# Patient Record
Sex: Female | Born: 1970 | Race: Black or African American | Hispanic: No | Marital: Single | Smoking: Never smoker
Health system: Southern US, Community
[De-identification: ages and names within clinical notes are randomized; demographics above are authoritative.]

## PROBLEM LIST (undated history)

## (undated) DIAGNOSIS — D649 Anemia, unspecified: Secondary | ICD-10-CM

## (undated) DIAGNOSIS — E559 Vitamin D deficiency, unspecified: Secondary | ICD-10-CM

## (undated) DIAGNOSIS — G43909 Migraine, unspecified, not intractable, without status migrainosus: Secondary | ICD-10-CM

## (undated) DIAGNOSIS — I951 Orthostatic hypotension: Secondary | ICD-10-CM

## (undated) DIAGNOSIS — R000 Tachycardia, unspecified: Secondary | ICD-10-CM

## (undated) DIAGNOSIS — F4310 Post-traumatic stress disorder, unspecified: Secondary | ICD-10-CM

## (undated) DIAGNOSIS — Z5189 Encounter for other specified aftercare: Secondary | ICD-10-CM

## (undated) DIAGNOSIS — N912 Amenorrhea, unspecified: Secondary | ICD-10-CM

## (undated) DIAGNOSIS — Z8719 Personal history of other diseases of the digestive system: Secondary | ICD-10-CM

## (undated) DIAGNOSIS — F32A Depression, unspecified: Secondary | ICD-10-CM

## (undated) DIAGNOSIS — E079 Disorder of thyroid, unspecified: Secondary | ICD-10-CM

## (undated) HISTORY — DX: Depression, unspecified: F32.A

## (undated) HISTORY — PX: TUBAL LIGATION: SHX77

## (undated) HISTORY — PX: HERNIA REPAIR: SHX51

---

## 2013-06-06 ENCOUNTER — Encounter (HOSPITAL_COMMUNITY): Payer: Self-pay | Admitting: Emergency Medicine

## 2013-06-06 ENCOUNTER — Emergency Department (HOSPITAL_COMMUNITY)
Admission: EM | Admit: 2013-06-06 | Discharge: 2013-06-06 | Disposition: A | Discharge status: Home / Self Care | Payer: Medicaid Other | Source: Home / Self Care | Attending: Family Medicine | Admitting: Family Medicine

## 2013-06-06 DIAGNOSIS — G43009 Migraine without aura, not intractable, without status migrainosus: Secondary | ICD-10-CM

## 2013-06-06 MED ORDER — DIPHENHYDRAMINE HCL 50 MG/ML IJ SOLN
INTRAMUSCULAR | Status: AC
  Filled 2013-06-06: qty 1

## 2013-06-06 MED ORDER — DIPHENHYDRAMINE HCL 50 MG/ML IJ SOLN
50.0000 mg | Freq: Once | INTRAMUSCULAR | Status: AC
  Administered 2013-06-06: 50 mg via INTRAMUSCULAR

## 2013-06-06 MED ORDER — KETOROLAC TROMETHAMINE 30 MG/ML IJ SOLN
INTRAMUSCULAR | Status: AC
  Filled 2013-06-06: qty 1

## 2013-06-06 MED ORDER — ONDANSETRON 4 MG PO TBDP
8.0000 mg | ORAL_TABLET | Freq: Once | ORAL | Status: AC
  Administered 2013-06-06: 8 mg via ORAL

## 2013-06-06 MED ORDER — KETOROLAC TROMETHAMINE 30 MG/ML IJ SOLN
30.0000 mg | Freq: Once | INTRAMUSCULAR | Status: AC
  Administered 2013-06-06: 30 mg via INTRAMUSCULAR

## 2013-06-06 MED ORDER — ONDANSETRON 4 MG PO TBDP
ORAL_TABLET | ORAL | Status: AC
  Filled 2013-06-06: qty 2
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
## 2013-06-06 NOTE — ED Notes (Signed)
Pt c/o migraine headache across forehead onset 3 days... Pain is constant and increases w/bright light and loud noise... sxs also include dizziness... Had tyle last night w/no relief... Reports tyle and ibup and Excedrin has no relief... She is alert w/no signs of acute distress

## 2013-06-28 ENCOUNTER — Emergency Department (HOSPITAL_COMMUNITY): Payer: Medicaid Other

## 2013-06-28 ENCOUNTER — Encounter (HOSPITAL_COMMUNITY): Payer: Self-pay | Admitting: Physical Medicine and Rehabilitation

## 2013-06-28 ENCOUNTER — Emergency Department (HOSPITAL_COMMUNITY)
Admission: EM | Admit: 2013-06-28 | Discharge: 2013-06-28 | Disposition: A | Discharge status: Home / Self Care | Payer: Medicaid Other | Attending: Emergency Medicine | Admitting: Emergency Medicine

## 2013-06-28 DIAGNOSIS — R42 Dizziness and giddiness: Secondary | ICD-10-CM | POA: Insufficient documentation

## 2013-06-28 DIAGNOSIS — R110 Nausea: Secondary | ICD-10-CM | POA: Insufficient documentation

## 2013-06-28 DIAGNOSIS — G43009 Migraine without aura, not intractable, without status migrainosus: Secondary | ICD-10-CM

## 2013-06-28 DIAGNOSIS — H53149 Visual discomfort, unspecified: Secondary | ICD-10-CM | POA: Insufficient documentation

## 2013-06-28 DIAGNOSIS — G43909 Migraine, unspecified, not intractable, without status migrainosus: Secondary | ICD-10-CM | POA: Insufficient documentation

## 2013-06-28 HISTORY — DX: Migraine, unspecified, not intractable, without status migrainosus: G43.909

## 2013-06-28 MED ORDER — KETOROLAC TROMETHAMINE 30 MG/ML IJ SOLN
30.0000 mg | Freq: Once | INTRAMUSCULAR | Status: AC
  Administered 2013-06-28: 30 mg via INTRAVENOUS
  Filled 2013-06-28: qty 1

## 2013-06-28 MED ORDER — DIPHENHYDRAMINE HCL 50 MG/ML IJ SOLN
50.0000 mg | Freq: Once | INTRAMUSCULAR | Status: AC
  Administered 2013-06-28: 50 mg via INTRAVENOUS
  Filled 2013-06-28: qty 1

## 2013-06-28 MED ORDER — SUMATRIPTAN SUCCINATE 50 MG PO TABS: 50.0000 mg | ORAL_TABLET | ORAL | Status: DC | PRN

## 2013-06-28 MED ORDER — MORPHINE SULFATE 2 MG/ML IJ SOLN: 1.0000 mg | Freq: Once | INTRAMUSCULAR | Status: DC

## 2013-06-28 MED ORDER — ONDANSETRON HCL 4 MG PO TABS
8.0000 mg | ORAL_TABLET | Freq: Once | ORAL | Status: AC
  Administered 2013-06-28: 8 mg via ORAL
  Filled 2013-06-28: qty 1

## 2013-06-28 NOTE — ED Notes (Signed)
Condition is chronic. Condition is made by light and "dizzy feeling" condition is made better by nothing.

## 2013-06-28 NOTE — ED Notes (Signed)
Pt presents to department for evaluation of headache. States she gets frequent migraines. Also states photosensitivity and nausea. 10/10 pain upon arrival to ED. She is alert and oriented x4. No neurological symptoms noted.

## 2013-06-28 NOTE — Progress Notes (Signed)
Met Patient at bedside,Case Management role explained.Patient reports increased migraine headache as reason for ED visit.Patient has MEDICAID[place]. Patient does not have a PCP,patient provided with a MEDICAID resource pack and education provided on medicaid resources,PCP providers . Patient verbalizes her understanding of verbal Written education today.No further Case management needs identified.

## 2013-06-28 NOTE — ED Provider Notes (Signed)
I saw and evaluated the patient, reviewed the resident's note and I agree with the findings and plan.   .Face to face Exam:  General:  Awake HEENT:  Atraumatic Resp:  Normal effort Abd:  Nondistended Neuro:No focal weakness  Nelia Shi, MD 06/28/13 1624

## 2013-06-28 NOTE — ED Notes (Signed)
IV team called at this time.  
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
## 2013-07-08 ENCOUNTER — Encounter (HOSPITAL_COMMUNITY): Payer: Self-pay | Admitting: Nurse Practitioner

## 2013-07-08 ENCOUNTER — Emergency Department (HOSPITAL_COMMUNITY)
Admission: EM | Admit: 2013-07-08 | Discharge: 2013-07-08 | Disposition: A | Discharge status: Home / Self Care | Payer: Medicaid Other | Attending: Emergency Medicine | Admitting: Emergency Medicine

## 2013-07-08 DIAGNOSIS — G43909 Migraine, unspecified, not intractable, without status migrainosus: Secondary | ICD-10-CM | POA: Insufficient documentation

## 2013-07-08 MED ORDER — KETOROLAC TROMETHAMINE 30 MG/ML IJ SOLN
30.0000 mg | Freq: Once | INTRAMUSCULAR | Status: AC
  Administered 2013-07-08: 30 mg via INTRAVENOUS
  Filled 2013-07-08: qty 1

## 2013-07-08 MED ORDER — METOCLOPRAMIDE HCL 5 MG/ML IJ SOLN
10.0000 mg | Freq: Once | INTRAMUSCULAR | Status: AC
  Administered 2013-07-08: 10 mg via INTRAVENOUS
  Filled 2013-07-08: qty 2

## 2013-07-08 MED ORDER — DIPHENHYDRAMINE HCL 50 MG/ML IJ SOLN
25.0000 mg | Freq: Once | INTRAMUSCULAR | Status: AC
  Administered 2013-07-08: 25 mg via INTRAVENOUS
  Filled 2013-07-08: qty 1

## 2013-07-08 MED ORDER — SODIUM CHLORIDE 0.9 % IV BOLUS (SEPSIS)
1000.0000 mL | Freq: Once | INTRAVENOUS | Status: AC
  Administered 2013-07-08: 1000 mL via INTRAVENOUS

## 2013-07-08 NOTE — ED Notes (Signed)
Pt walked to the bathroom with no difficulty

## 2013-07-08 NOTE — ED Notes (Addendum)
Pt called ems for migraine x 4 days, reports history of migraines, [place] and is trying to get a PCP but has been unable to obtain appt yet. Took imitrex with no relief

## 2013-07-08 NOTE — ED Notes (Signed)
Patient is alert and orientedx4.  Patient was explained discharge instructions and they understood them with no questions.  The patient is going to take a cab home. 

## 2013-07-08 NOTE — ED Provider Notes (Signed)
CSN: [id number]     Arrival date & time 07/08/13  1221 History     First MD Initiated Contact with Patient 07/08/13 1502     Chief Complaint  Patient presents with  . Headache   (Consider location/radiation/quality/duration/timing/severity/associated sxs/prior Treatment) HPI Christina Mcbride is a 42 y.o. female who presents to ED with complaint of a headache for 3 days. States hx of the same. Associated symptoms include photophobia and dizziness. Tried imitrex with no relief. Pain in the right side that shoots into forehead and back of the head. 9/10. Denies fever, chills, neck stiffness, rash, recent travel.   Past Medical History  Diagnosis Date  . Migraine    History reviewed. No pertinent past surgical history. History reviewed. No pertinent family history. History  Substance Use Topics  . Smoking status: Never Smoker   . Smokeless tobacco: Not on file  . Alcohol Use: No   OB History   Grav Para Term Preterm Abortions TAB SAB Ect Mult Living                 Review of Systems  Constitutional: Negative for fever and chills.  HENT: Negative for neck pain and neck stiffness.   Eyes: Positive for photophobia. Negative for pain and visual disturbance.  Respiratory: Negative.   Cardiovascular: Negative.   Gastrointestinal: Negative.   Genitourinary: Negative for dysuria.  Skin: Negative for rash.  Neurological: Positive for dizziness and headaches. Negative for syncope, speech difficulty, weakness and numbness.  All other systems reviewed and are negative.    Allergies  Bee venom  Home Medications   Current Outpatient Rx  Name  Route  Sig  Dispense  Refill  . SUMAtriptan (IMITREX) 50 MG tablet   Oral   Take 50 mg by mouth every 2 (two) hours as needed for migraine. Do not exceed 200mg  in 24 hours (4 pills)          BP 137/79  Pulse 77  Temp(Src) 98.3 F (36.8 C) (Oral)  Resp 16  SpO2 100% Physical Exam  Nursing note and vitals reviewed. Constitutional: She  is oriented to person, place, and time. She appears well-developed and well-nourished. No distress.  HENT:  Head: Normocephalic and atraumatic.  Eyes: Conjunctivae and EOM are normal. Pupils are equal, round, and reactive to light.  Neck: Normal range of motion. Neck supple.  No meningismus  Cardiovascular: Normal rate, regular rhythm and normal heart sounds.   Pulmonary/Chest: Effort normal and breath sounds normal. No respiratory distress. She has no wheezes. She has no rales.  Musculoskeletal: Normal range of motion. She exhibits no edema.  Neurological: She is alert and oriented to person, place, and time. No cranial nerve deficit. Coordination normal.  5/5 and equal upper and lower extremity strength bilaterally. Equal grip strength bilaterally. Normal finger to nose and heel to shin. No pronator drift.  Skin: Skin is warm and dry.  Psychiatric: She has a normal mood and affect. Her behavior is normal.    ED Course   Procedures (including critical care time)  3:40 PM Pt with typical for her migraine. No relief at home with imitrex. Will try toradol, reglan, benadryl cocktail.     1. Migraine headache     MDM  Pt withtypical for her migraine. Exam normal. Neurovascularly intact. VS normal. Pt feels better with toradol, reglan, benadryl. She will be dc home with outpatient follow up.  Filed Vitals:   07/08/13 1530 07/08/13 1550 07/08/13 1730 07/08/13 1735  BP: 122/63 122/63  132/83 132/83  Pulse: 69 74 74 67  Temp:      TempSrc:      Resp:  20  18  SpO2: 100% 100% 99% 100%     Laylana Gerwig A Kile Kabler, PA-C 07/08/13 2310

## 2013-07-09 NOTE — ED Provider Notes (Signed)
Medical screening examination/treatment/procedure(s) were performed by non-physician practitioner and as supervising physician I was immediately available for consultation/collaboration.   Dagmar Hait, MD 07/09/13 [phone]

## 2013-09-03 ENCOUNTER — Emergency Department (HOSPITAL_COMMUNITY)
Admission: EM | Admit: 2013-09-03 | Discharge: 2013-09-03 | Disposition: A | Discharge status: Home / Self Care | Payer: Medicaid Other | Source: Home / Self Care

## 2013-09-03 ENCOUNTER — Encounter (HOSPITAL_COMMUNITY): Payer: Self-pay | Admitting: Emergency Medicine

## 2013-09-03 DIAGNOSIS — Z8669 Personal history of other diseases of the nervous system and sense organs: Secondary | ICD-10-CM

## 2013-09-03 DIAGNOSIS — G43009 Migraine without aura, not intractable, without status migrainosus: Secondary | ICD-10-CM

## 2013-09-03 MED ORDER — DIPHENHYDRAMINE HCL 50 MG/ML IJ SOLN
50.0000 mg | Freq: Once | INTRAMUSCULAR | Status: AC
  Administered 2013-09-03: 50 mg via INTRAMUSCULAR

## 2013-09-03 MED ORDER — KETOROLAC TROMETHAMINE 60 MG/2ML IM SOLN
60.0000 mg | Freq: Once | INTRAMUSCULAR | Status: AC
  Administered 2013-09-03: 60 mg via INTRAMUSCULAR

## 2013-09-03 MED ORDER — DIPHENHYDRAMINE HCL 50 MG/ML IJ SOLN
INTRAMUSCULAR | Status: AC
  Filled 2013-09-03: qty 1

## 2013-09-03 MED ORDER — KETOROLAC TROMETHAMINE 60 MG/2ML IM SOLN
INTRAMUSCULAR | Status: AC
  Filled 2013-09-03: qty 2

## 2013-09-03 MED ORDER — ONDANSETRON 4 MG PO TBDP
ORAL_TABLET | ORAL | Status: AC
  Filled 2013-09-03: qty 1

## 2013-09-03 MED ORDER — ONDANSETRON 4 MG PO TBDP
4.0000 mg | ORAL_TABLET | Freq: Once | ORAL | Status: AC
  Administered 2013-09-03: 4 mg via ORAL

## 2013-09-03 MED ORDER — DEXAMETHASONE SODIUM PHOSPHATE 10 MG/ML IJ SOLN
INTRAMUSCULAR | Status: AC
  Filled 2013-09-03: qty 1

## 2013-09-03 MED ORDER — DEXAMETHASONE SODIUM PHOSPHATE 10 MG/ML IJ SOLN
10.0000 mg | Freq: Once | INTRAMUSCULAR | Status: AC
  Administered 2013-09-03: 10 mg via INTRAMUSCULAR

## 2013-09-03 NOTE — ED Provider Notes (Signed)
Medical screening examination/treatment/procedure(s) were performed by non-physician practitioner and as supervising physician I was immediately available for consultation/collaboration.  Nekisha Mcdiarmid, M.D.  Oryn Casanova C Jelicia Nantz, MD 09/03/13 1152 

## 2013-09-03 NOTE — ED Notes (Signed)
C/o migraines gradually getting worse. Sensitivity to light. States has periods when vision goes dark. Nausea, constant pounding. Under a lot of stress lately.  Pt has tried cutting out caffeine with no relief. Also takes Excedrin migraine and sumatriptan with no relief.

## 2013-09-03 NOTE — ED Provider Notes (Signed)
CSN: [id number]     Arrival date & time 09/03/13  4098 History   First MD Initiated Contact with Patient 09/03/13 1017     Chief Complaint  Patient presents with  . Migraine    x 2 wks.    (Consider location/radiation/quality/duration/timing/severity/associated sxs/prior Treatment) HPI Comments: 42 year old female with a history of migraine headaches presents to the urgent care with a migraine fairly typical of her previous ones. She has been seen in emergency department twice in the urgent care once in the past 2 months. On August 2 when being seen in emergency department a head CT was performed. The interpretation was negative for pathology. She  stated previously that she did not have a physician although she does have Medicaid. The caseworker in emergency department gave her resources for PCP access with Medicaid. When I first entered the exam room she was on the telephone so I walked out and gave her a chance to finish her call. 1030: Patient is complaining of a typical migraine for her. The difference is that she is now having sudden visual changes for just a few seconds periodically. Pain is located across the forehead. She describes it as a severe dull ache and occasionally pounds. His been getting worse over the past 2 weeks. It is associated with dizziness, nausea and photophobia. She has taken OTC Excedrin Migraine and 2 days ago symmetric and which did not help.   Past Medical History  Diagnosis Date  . Migraine    History reviewed. No pertinent past surgical history. History reviewed. No pertinent family history. History  Substance Use Topics  . Smoking status: Never Smoker   . Smokeless tobacco: Not on file  . Alcohol Use: No   OB History   Grav Para Term Preterm Abortions TAB SAB Ect Mult Living                 Review of Systems  Constitutional: Positive for activity change and appetite change. Negative for fever.  HENT: Negative for ear discharge, ear pain, postnasal  drip, rhinorrhea and sinus pressure.   Respiratory: Negative.   Cardiovascular: Negative.   Gastrointestinal: Positive for nausea.  Genitourinary: Negative.   Neurological: Positive for dizziness, weakness and headaches. Negative for tremors, seizures, syncope, facial asymmetry, speech difficulty and numbness.  Psychiatric/Behavioral: Positive for sleep disturbance. The patient is nervous/anxious.     Allergies  Bee venom  Home Medications   Current Outpatient Rx  Name  Route  Sig  Dispense  Refill  . SUMAtriptan (IMITREX) 50 MG tablet   Oral   Take 50 mg by mouth every 2 (two) hours as needed for migraine. Do not exceed 200mg  in 24 hours (4 pills)          BP 149/90  Pulse 93  Temp(Src) 98.6 F (37 C) (Oral)  Resp 20  SpO2 97%  LMP 08/23/2013 Physical Exam  Nursing note and vitals reviewed. Constitutional: She is oriented to person, place, and time. She appears well-developed and well-nourished. She appears distressed.  HENT:  Mouth/Throat: Oropharynx is clear and moist. No oropharyngeal exudate.  Bilateral TMs are normal Soft palate rises symmetrically Swallowing is normal  Eyes: Conjunctivae and EOM are normal. Pupils are equal, round, and reactive to light.  Neck: Normal range of motion. Neck supple.  Cardiovascular: Normal rate, regular rhythm and normal heart sounds.   Pulmonary/Chest: Effort normal and breath sounds normal.  Musculoskeletal: She exhibits no edema and no tenderness.  Lymphadenopathy:    She has  no cervical adenopathy.  Neurological: She is alert and oriented to person, place, and time. She has normal strength. No cranial nerve deficit or sensory deficit. She exhibits normal muscle tone. Coordination normal.  Skin: Skin is warm and dry. No rash noted.    ED Course  Procedures (including critical care time) Labs Review Labs Reviewed - No data to display Imaging Review No results found.  MDM   1. Migraine headache without aura   2.  History of migraine headaches    Neurologic exam is intact. No abnormal findings.  The patient was given a cocktail of IM Benadryl 50 mg, IM Toradol 60 mg, IM Decadron 10 mg. Zofran 4 mg by mouth. After a period of observation for approximately one hour she states she is feeling well but better and ready to go. She will be discharged in a stable and improved condition and will be going home by bus.  Hayden Rasmussen, NP 09/03/13 1141

## 2013-12-26 ENCOUNTER — Ambulatory Visit (HOSPITAL_BASED_OUTPATIENT_CLINIC_OR_DEPARTMENT_OTHER): Payer: Medicaid Other | Admitting: Internal Medicine

## 2013-12-26 ENCOUNTER — Encounter: Payer: Self-pay | Admitting: Internal Medicine

## 2013-12-26 VITALS — BP 149/93 | HR 88 | Temp 97.8°F | Resp 14 | Ht 66.0 in | Wt 247.4 lb

## 2013-12-26 DIAGNOSIS — R519 Headache, unspecified: Secondary | ICD-10-CM

## 2013-12-26 DIAGNOSIS — R609 Edema, unspecified: Secondary | ICD-10-CM | POA: Diagnosis present

## 2013-12-26 DIAGNOSIS — E860 Dehydration: Secondary | ICD-10-CM | POA: Diagnosis present

## 2013-12-26 DIAGNOSIS — Z23 Encounter for immunization: Secondary | ICD-10-CM

## 2013-12-26 DIAGNOSIS — G43909 Migraine, unspecified, not intractable, without status migrainosus: Secondary | ICD-10-CM | POA: Diagnosis present

## 2013-12-26 DIAGNOSIS — E876 Hypokalemia: Secondary | ICD-10-CM | POA: Diagnosis present

## 2013-12-26 DIAGNOSIS — R51 Headache: Secondary | ICD-10-CM

## 2013-12-26 DIAGNOSIS — N390 Urinary tract infection, site not specified: Secondary | ICD-10-CM | POA: Diagnosis present

## 2013-12-26 DIAGNOSIS — F4310 Post-traumatic stress disorder, unspecified: Secondary | ICD-10-CM | POA: Diagnosis present

## 2013-12-26 DIAGNOSIS — E049 Nontoxic goiter, unspecified: Secondary | ICD-10-CM | POA: Diagnosis present

## 2013-12-26 DIAGNOSIS — D509 Iron deficiency anemia, unspecified: Principal | ICD-10-CM | POA: Diagnosis present

## 2013-12-26 DIAGNOSIS — A599 Trichomoniasis, unspecified: Secondary | ICD-10-CM | POA: Diagnosis present

## 2013-12-26 DIAGNOSIS — Z79899 Other long term (current) drug therapy: Secondary | ICD-10-CM

## 2013-12-26 LAB — COMPLETE METABOLIC PANEL WITH GFR
ALK PHOS: 74 U/L (ref 39–117)
ALT: <8 U/L (ref 0–35)
AST: 8 U/L (ref 0–37)
Albumin: 3.8 g/dL (ref 3.5–5.2)
BUN: 11 mg/dL (ref 6–23)
CO2: 25 mEq/L (ref 19–32)
CREATININE: 0.71 mg/dL (ref 0.50–1.10)
Calcium: 8.5 mg/dL (ref 8.4–10.5)
Chloride: 105 mEq/L (ref 96–112)
GFR, Est African American: >89 mL/min
GLUCOSE: 68 mg/dL — AB (ref 70–99)
Potassium: 3.8 mEq/L (ref 3.5–5.3)
Sodium: 137 mEq/L (ref 135–145)
Total Bilirubin: 0.2 mg/dL (ref 0.2–1.2)
Total Protein: 7.6 g/dL (ref 6.0–8.3)

## 2013-12-26 LAB — CBC WITH DIFFERENTIAL/PLATELET
BASOS ABS: 0 10*3/uL (ref 0.0–0.1)
BASOS PCT: 0 % (ref 0–1)
EOS ABS: 0.1 10*3/uL (ref 0.0–0.7)
EOS PCT: 1 % (ref 0–5)
HEMATOCRIT: 18.6 % — AB (ref 36.0–46.0)
Hemoglobin: 5.1 g/dL — CL (ref 12.0–15.0)
Lymphocytes Relative: 25 % (ref 12–46)
Lymphs Abs: 2.3 10*3/uL (ref 0.7–4.0)
MCH: 15.7 pg — AB (ref 26.0–34.0)
MCHC: 27.4 g/dL — AB (ref 30.0–36.0)
MCV: 57.2 fL — AB (ref 78.0–100.0)
MONO ABS: 0.6 10*3/uL (ref 0.1–1.0)
MONOS PCT: 7 % (ref 3–12)
NEUTROS ABS: 6.1 10*3/uL (ref 1.7–7.7)
Neutrophils Relative %: 67 % (ref 43–77)
Platelets: 642 10*3/uL — ABNORMAL HIGH (ref 150–400)
RBC: 3.25 MIL/uL — ABNORMAL LOW (ref 3.87–5.11)
RDW: 20.6 % — AB (ref 11.5–15.5)
WBC: 9.1 10*3/uL (ref 4.0–10.5)

## 2013-12-26 LAB — LIPID PANEL
Cholesterol: 181 mg/dL (ref 0–200)
HDL: 57 mg/dL (ref >39)
LDL CALC: 104 mg/dL — AB (ref 0–99)
TRIGLYCERIDES: 102 mg/dL (ref <150)
Total CHOL/HDL Ratio: 3.2 Ratio
VLDL: 20 mg/dL (ref 0–40)

## 2013-12-26 LAB — POCT URINE PREGNANCY: PREG TEST UR: NEGATIVE

## 2013-12-26 LAB — POCT GLYCOSYLATED HEMOGLOBIN (HGB A1C): HEMOGLOBIN A1C: 4.9

## 2013-12-26 MED ORDER — TOPIRAMATE 50 MG PO TABS: 50.0000 mg | ORAL_TABLET | Freq: Two times a day (BID) | ORAL | Status: DC

## 2013-12-26 MED ORDER — SUMATRIPTAN SUCCINATE 50 MG PO TABS: 50.0000 mg | ORAL_TABLET | ORAL | Status: DC | PRN

## 2013-12-26 NOTE — Progress Notes (Signed)
Pt is here to establish care. Pt does suffer from post-traumatic stress disorder. Has a family history of hypertension, diabetes, and cancer. Complains of chronic headaches x4 years, dizziness, hard time keeping her balance, sensitivity to light and sound, slight nausea, no vomiting, blurred vision. Pt used to wear glasses. Pain scale of 7 today. Taking OTC medication, but nothing works.
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
## 2013-12-27 ENCOUNTER — Inpatient Hospital Stay (HOSPITAL_COMMUNITY)
Admission: EM | Admit: 2013-12-27 | Discharge: 2013-12-29 | DRG: 812 | Disposition: A | Discharge status: Home / Self Care | Payer: Medicaid Other | Attending: Internal Medicine | Admitting: Internal Medicine

## 2013-12-27 ENCOUNTER — Encounter (HOSPITAL_COMMUNITY): Payer: Self-pay | Admitting: Emergency Medicine

## 2013-12-27 DIAGNOSIS — G43909 Migraine, unspecified, not intractable, without status migrainosus: Secondary | ICD-10-CM | POA: Diagnosis present

## 2013-12-27 DIAGNOSIS — R42 Dizziness and giddiness: Secondary | ICD-10-CM | POA: Diagnosis present

## 2013-12-27 DIAGNOSIS — R790 Abnormal level of blood mineral: Secondary | ICD-10-CM | POA: Diagnosis present

## 2013-12-27 DIAGNOSIS — N390 Urinary tract infection, site not specified: Secondary | ICD-10-CM | POA: Diagnosis present

## 2013-12-27 DIAGNOSIS — A599 Trichomoniasis, unspecified: Secondary | ICD-10-CM | POA: Diagnosis present

## 2013-12-27 DIAGNOSIS — R531 Weakness: Secondary | ICD-10-CM | POA: Insufficient documentation

## 2013-12-27 DIAGNOSIS — D509 Iron deficiency anemia, unspecified: Secondary | ICD-10-CM | POA: Diagnosis present

## 2013-12-27 DIAGNOSIS — E049 Nontoxic goiter, unspecified: Secondary | ICD-10-CM | POA: Diagnosis present

## 2013-12-27 DIAGNOSIS — R5383 Other fatigue: Secondary | ICD-10-CM

## 2013-12-27 DIAGNOSIS — R5381 Other malaise: Secondary | ICD-10-CM

## 2013-12-27 DIAGNOSIS — R609 Edema, unspecified: Secondary | ICD-10-CM

## 2013-12-27 DIAGNOSIS — R000 Tachycardia, unspecified: Secondary | ICD-10-CM | POA: Diagnosis present

## 2013-12-27 DIAGNOSIS — D649 Anemia, unspecified: Secondary | ICD-10-CM | POA: Insufficient documentation

## 2013-12-27 DIAGNOSIS — E860 Dehydration: Secondary | ICD-10-CM

## 2013-12-27 DIAGNOSIS — F4310 Post-traumatic stress disorder, unspecified: Secondary | ICD-10-CM | POA: Diagnosis present

## 2013-12-27 DIAGNOSIS — R600 Localized edema: Secondary | ICD-10-CM

## 2013-12-27 HISTORY — DX: Post-traumatic stress disorder, unspecified: F43.10

## 2013-12-27 HISTORY — DX: Anemia, unspecified: D64.9

## 2013-12-27 LAB — CBC
HCT: 19.6 % — ABNORMAL LOW (ref 36.0–46.0)
Hemoglobin: 5.5 g/dL — CL (ref 12.0–15.0)
MCH: 17.4 pg — ABNORMAL LOW (ref 26.0–34.0)
MCHC: 28.1 g/dL — AB (ref 30.0–36.0)
MCV: 62 fL — ABNORMAL LOW (ref 78.0–100.0)
Platelets: 543 10*3/uL — ABNORMAL HIGH (ref 150–400)
RBC: 3.16 MIL/uL — ABNORMAL LOW (ref 3.87–5.11)
RDW: 22.8 % — AB (ref 11.5–15.5)
WBC: 8.4 10*3/uL (ref 4.0–10.5)

## 2013-12-27 LAB — RAPID URINE DRUG SCREEN, HOSP PERFORMED
Amphetamines: NOT DETECTED
BARBITURATES: NOT DETECTED
Benzodiazepines: NOT DETECTED
Cocaine: NOT DETECTED
Opiates: NOT DETECTED
Tetrahydrocannabinol: NOT DETECTED

## 2013-12-27 LAB — IRON AND TIBC
Iron: 13 ug/dL — ABNORMAL LOW (ref 42–135)
SATURATION RATIOS: 3 % — AB (ref 20–55)
TIBC: 407 ug/dL (ref 250–470)
UIBC: 394 ug/dL (ref 125–400)

## 2013-12-27 LAB — COMPREHENSIVE METABOLIC PANEL
ALK PHOS: 75 U/L (ref 39–117)
AST: 8 U/L (ref 0–37)
Albumin: 2.8 g/dL — ABNORMAL LOW (ref 3.5–5.2)
BUN: 11 mg/dL (ref 6–23)
CO2: 24 meq/L (ref 19–32)
Calcium: 8.2 mg/dL — ABNORMAL LOW (ref 8.4–10.5)
Chloride: 105 mEq/L (ref 96–112)
Creatinine, Ser: 0.77 mg/dL (ref 0.50–1.10)
GLUCOSE: 133 mg/dL — AB (ref 70–99)
POTASSIUM: 3.4 meq/L — AB (ref 3.7–5.3)
SODIUM: 140 meq/L (ref 137–147)
Total Protein: 7.5 g/dL (ref 6.0–8.3)

## 2013-12-27 LAB — URINALYSIS, ROUTINE W REFLEX MICROSCOPIC
Bilirubin Urine: NEGATIVE
GLUCOSE, UA: NEGATIVE mg/dL
Hgb urine dipstick: NEGATIVE
Ketones, ur: NEGATIVE mg/dL
Nitrite: POSITIVE — AB
Protein, ur: NEGATIVE mg/dL
Specific Gravity, Urine: 1.017 (ref 1.005–1.030)
UROBILINOGEN UA: 1 mg/dL (ref 0.0–1.0)
pH: 6.5 (ref 5.0–8.0)

## 2013-12-27 LAB — CREATININE, SERUM: Creatinine, Ser: 0.75 mg/dL (ref 0.50–1.10)

## 2013-12-27 LAB — CBC WITH DIFFERENTIAL/PLATELET
BASOS PCT: 0 % (ref 0–1)
Basophils Absolute: 0 10*3/uL (ref 0.0–0.1)
EOS ABS: 0.2 10*3/uL (ref 0.0–0.7)
EOS PCT: 2 % (ref 0–5)
HEMATOCRIT: 17.6 % — AB (ref 36.0–46.0)
HEMOGLOBIN: 4.7 g/dL — AB (ref 12.0–15.0)
LYMPHS PCT: 27 % (ref 12–46)
Lymphs Abs: 2.5 10*3/uL (ref 0.7–4.0)
MCH: 16 pg — AB (ref 26.0–34.0)
MCHC: 26.7 g/dL — AB (ref 30.0–36.0)
MCV: 60.1 fL — ABNORMAL LOW (ref 78.0–100.0)
Monocytes Absolute: 0.8 10*3/uL (ref 0.1–1.0)
Monocytes Relative: 8 % (ref 3–12)
NEUTROS ABS: 5.9 10*3/uL (ref 1.7–7.7)
NEUTROS PCT: 63 % (ref 43–77)
Platelets: 590 10*3/uL — ABNORMAL HIGH (ref 150–400)
RBC: 2.93 MIL/uL — AB (ref 3.87–5.11)
RDW: 20.7 % — ABNORMAL HIGH (ref 11.5–15.5)
WBC: 9.4 10*3/uL (ref 4.0–10.5)

## 2013-12-27 LAB — HEMOGLOBIN A1C
HEMOGLOBIN A1C: 5.7 % — AB (ref <5.7)
Mean Plasma Glucose: 117 mg/dL — ABNORMAL HIGH (ref <117)

## 2013-12-27 LAB — DIRECT ANTIGLOBULIN TEST (NOT AT ARMC)
DAT, IgG: NEGATIVE
DAT, complement: NEGATIVE

## 2013-12-27 LAB — URINE MICROSCOPIC-ADD ON

## 2013-12-27 LAB — SAMPLE TO BLOOD BANK

## 2013-12-27 LAB — RETICULOCYTES
RBC.: 3.06 MIL/uL — ABNORMAL LOW (ref 3.87–5.11)
RETIC CT PCT: 1.7 % (ref 0.4–3.1)
Retic Count, Absolute: 52 10*3/uL (ref 19.0–186.0)

## 2013-12-27 LAB — PROTIME-INR
INR: 1.08 (ref 0.00–1.49)
Prothrombin Time: 13.8 seconds (ref 11.6–15.2)

## 2013-12-27 LAB — OCCULT BLOOD, POC DEVICE: Fecal Occult Bld: NEGATIVE

## 2013-12-27 LAB — HIV ANTIBODY (ROUTINE TESTING W REFLEX): HIV: NONREACTIVE

## 2013-12-27 LAB — T4, FREE: FREE T4: 0.82 ng/dL (ref 0.80–1.80)

## 2013-12-27 LAB — TSH
TSH: 1.236 u[IU]/mL (ref 0.350–4.500)
TSH: 1.478 u[IU]/mL (ref 0.350–4.500)

## 2013-12-27 LAB — FOLATE: Folate: 12.8 ng/mL

## 2013-12-27 LAB — VITAMIN B12: Vitamin B-12: 691 pg/mL (ref 211–911)

## 2013-12-27 LAB — ABO/RH: ABO/RH(D): B POS

## 2013-12-27 LAB — FERRITIN: Ferritin: <1 ng/mL — ABNORMAL LOW (ref 10–291)

## 2013-12-27 LAB — PRO B NATRIURETIC PEPTIDE: Pro B Natriuretic peptide (BNP): 448.1 pg/mL — ABNORMAL HIGH (ref 0–125)

## 2013-12-27 LAB — LACTATE DEHYDROGENASE: LDH: 189 U/L (ref 94–250)

## 2013-12-27 LAB — APTT: aPTT: 32 seconds (ref 24–37)

## 2013-12-27 LAB — PREPARE RBC (CROSSMATCH)

## 2013-12-27 MED ORDER — POLYETHYLENE GLYCOL 3350 17 G PO PACK
17.0000 g | PACK | Freq: Every day | ORAL | Status: DC | PRN
  Filled 2013-12-27: qty 1

## 2013-12-27 MED ORDER — METRONIDAZOLE 500 MG PO TABS
1000.0000 mg | ORAL_TABLET | Freq: Two times a day (BID) | ORAL | Status: AC
  Administered 2013-12-27 – 2013-12-28 (×2): 1000 mg via ORAL
  Filled 2013-12-27 (×2): qty 2

## 2013-12-27 MED ORDER — POTASSIUM CHLORIDE IN NACL 20-0.9 MEQ/L-% IV SOLN
INTRAVENOUS | Status: DC
  Administered 2013-12-27 – 2013-12-28 (×2): via INTRAVENOUS
  Filled 2013-12-27 (×5): qty 1000

## 2013-12-27 MED ORDER — DIPHENHYDRAMINE HCL 25 MG PO CAPS
25.0000 mg | ORAL_CAPSULE | Freq: Once | ORAL | Status: AC
  Administered 2013-12-27: 25 mg via ORAL
  Filled 2013-12-27: qty 1

## 2013-12-27 MED ORDER — SODIUM CHLORIDE 0.9 % IJ SOLN
3.0000 mL | Freq: Two times a day (BID) | INTRAMUSCULAR | Status: DC
  Administered 2013-12-28 (×2): 3 mL via INTRAVENOUS

## 2013-12-27 MED ORDER — HEPARIN SODIUM (PORCINE) 5000 UNIT/ML IJ SOLN
5000.0000 [IU] | Freq: Three times a day (TID) | INTRAMUSCULAR | Status: DC
  Administered 2013-12-27 – 2013-12-29 (×6): 5000 [IU] via SUBCUTANEOUS
  Filled 2013-12-27 (×10): qty 1

## 2013-12-27 MED ORDER — ACETAMINOPHEN 325 MG PO TABS
650.0000 mg | ORAL_TABLET | Freq: Four times a day (QID) | ORAL | Status: DC | PRN
  Administered 2013-12-28: 650 mg via ORAL
  Filled 2013-12-27: qty 2

## 2013-12-27 MED ORDER — ACETAMINOPHEN 650 MG RE SUPP: 650.0000 mg | Freq: Four times a day (QID) | RECTAL | Status: DC | PRN

## 2013-12-27 MED ORDER — PANTOPRAZOLE SODIUM 40 MG PO TBEC
40.0000 mg | DELAYED_RELEASE_TABLET | Freq: Every day | ORAL | Status: DC
  Administered 2013-12-27 – 2013-12-28 (×2): 40 mg via ORAL
  Filled 2013-12-27 (×2): qty 1

## 2013-12-27 MED ORDER — ONDANSETRON HCL 4 MG/2ML IJ SOLN: 4.0000 mg | Freq: Four times a day (QID) | INTRAMUSCULAR | Status: DC | PRN

## 2013-12-27 MED ORDER — HYDROCODONE-ACETAMINOPHEN 5-325 MG PO TABS
1.0000 | ORAL_TABLET | ORAL | Status: DC | PRN
  Administered 2013-12-27 – 2013-12-28 (×2): 1 via ORAL
  Filled 2013-12-27 (×2): qty 1

## 2013-12-27 MED ORDER — POTASSIUM CHLORIDE CRYS ER 20 MEQ PO TBCR
40.0000 meq | EXTENDED_RELEASE_TABLET | Freq: Once | ORAL | Status: AC
  Administered 2013-12-27: 40 meq via ORAL
  Filled 2013-12-27: qty 2

## 2013-12-27 MED ORDER — ONDANSETRON HCL 4 MG PO TABS: 4.0000 mg | ORAL_TABLET | Freq: Four times a day (QID) | ORAL | Status: DC | PRN

## 2013-12-27 MED ORDER — DEXTROSE 5 % IV SOLN
1.0000 g | INTRAVENOUS | Status: DC
  Administered 2013-12-27: 1 g via INTRAVENOUS
  Filled 2013-12-27 (×2): qty 10

## 2013-12-27 MED ORDER — ACETAMINOPHEN 325 MG PO TABS
650.0000 mg | ORAL_TABLET | Freq: Once | ORAL | Status: AC
  Administered 2013-12-27: 650 mg via ORAL
  Filled 2013-12-27: qty 2

## 2013-12-27 MED ORDER — SODIUM CHLORIDE 0.9 % IV SOLN: INTRAVENOUS | Status: DC

## 2013-12-27 MED ORDER — FERROUS SULFATE 325 (65 FE) MG PO TABS
325.0000 mg | ORAL_TABLET | Freq: Every day | ORAL | Status: DC
  Administered 2013-12-28 – 2013-12-29 (×2): 325 mg via ORAL
  Filled 2013-12-27 (×3): qty 1

## 2013-12-27 NOTE — ED Notes (Signed)
Dr. Pollina at bedside   

## 2013-12-27 NOTE — Discharge Instructions (Signed)
Iron Deficiency Anemia, Adult Anemia is when you have a low number of healthy red blood cells. It is often caused by too little iron. This is called iron deficiency anemia. It may make you tired and short of breath. HOME CARE   Take iron as told by your doctor.  Take vitamins as told by your doctor.  Eat foods that have iron in them. This includes liver, lean beef, whole-grain bread, eggs, dried fruit, and dark green leafy vegetables. GET HELP RIGHT AWAY IF:  You pass out (faint).  You have chest pain.  You feel sick to your stomach (nauseous) or throw up (vomit).  You get very short of breath with activity.  You are weak.  You have a fast heartbeat.  You start to sweat for no reason.  You become lightheaded when getting up from a chair or bed. MAKE SURE YOU:  Understand these instructions.  Will watch your condition.  Will get help right away if you are not doing well or get worse. Document Released: 12/16/2010 Document Revised: 09/03/2013 Document Reviewed: 07/21/2013 [facility] Patient Information 2014 [place]. Anemia, Nonspecific Anemia is a condition in which the concentration of red blood cells or hemoglobin in the blood is below normal. Hemoglobin is a substance in red blood cells that carries oxygen to the tissues of the body. Anemia results in not enough oxygen reaching these tissues.  CAUSES  Common causes of anemia include:   Excessive bleeding. Bleeding may be internal or external. This includes excessive bleeding from periods (in women) or from the intestine.   Poor nutrition.   Chronic kidney, thyroid, and liver disease.  Bone marrow disorders that decrease red blood cell production.  Cancer and treatments for cancer.  HIV, AIDS, and their treatments.  Spleen problems that increase red blood cell destruction.  Blood disorders.  Excess destruction of red blood cells due to infection, medicines, and autoimmune disorders. SIGNS AND  SYMPTOMS   Minor weakness.   Dizziness.   Headache.  Palpitations.   Shortness of breath, especially with exercise.   Paleness.  Cold sensitivity.  Indigestion.  Nausea.  Difficulty sleeping.  Difficulty concentrating. Symptoms may occur suddenly or they may develop slowly.  DIAGNOSIS  Additional blood tests are often needed. These help your health care provider determine the best treatment. Your health care provider will check your stool for blood and look for other causes of blood loss.  TREATMENT  Treatment varies depending on the cause of the anemia. Treatment can include:   Supplements of iron, vitamin I95, or folic acid.   Hormone medicines.   A blood transfusion. This may be needed if blood loss is severe.   Hospitalization. This may be needed if there is significant continual blood loss.   Dietary changes.  Spleen removal. HOME CARE INSTRUCTIONS Keep all follow-up appointments. It often takes many weeks to correct anemia, and having your health care provider check on your condition and your response to treatment is very important. SEEK IMMEDIATE MEDICAL CARE IF:   You develop extreme weakness, shortness of breath, or chest pain.   You become dizzy or have trouble concentrating.  You develop heavy vaginal bleeding.   You develop a rash.   You have bloody or black, tarry stools.   You faint.   You vomit up blood.   You vomit repeatedly.   You have abdominal pain.  You have a fever or persistent symptoms for more than 2 3 days.   You have a fever and  your symptoms suddenly get worse.   You are dehydrated.  MAKE SURE YOU:  Understand these instructions.  Will watch your condition.  Will get help right away if you are not doing well or get worse. Document Released: 12/21/2004 Document Revised: 07/16/2013 Document Reviewed: 05/09/2013 [facility] Patient Information 2014 [place].

## 2013-12-27 NOTE — ED Notes (Signed)
The pt has been having headaches and she was seen in the clinic today and had blood drawn.  She was called tonight and was told her hgb was low and she needed to be seen in the ed.  Hx of anemia.  lmp now

## 2013-12-27 NOTE — ED Provider Notes (Signed)
CSN: [id number]     Arrival date & time 12/26/13  2357 History   First MD Initiated Contact with Patient 12/27/13 0345     Chief Complaint  Patient presents with  . low hgb    (Consider location/radiation/quality/duration/timing/severity/associated sxs/prior Treatment) HPI Comments: Patient presents to the ER after having outpatient blood work performed and it was found that she was profoundly anemic. Patient reports that she saw her doctor for evaluation of headaches which have been ongoing for 4 years. She had blood work performed and was called at home, told to come to the year because her blood blood cells were very low. Patient reports that she has been experiencing dizziness, but she is really the dizziness to her headaches. She does not experience any chest pain, but she reports that she has been very short of breath with walking and exertion.  Patient has not had any hematuria or rectal bleeding. She reports that her menstrual periods have been normal for her, no excessive bleeding. He normally last approximately 4 days with one or 2 heavy days followed by one or 2 light days. She says she was told she was mildly anemic when she was pregnant, but other than that has never had a problem with anemia.   Past Medical History  Diagnosis Date  . Migraine   . Anemia    History reviewed. No pertinent past surgical history. Family History  Problem Relation Age of Onset  . Hypertension Mother   . Arthritis Mother   . Hypertension Son   . Cancer Maternal Aunt   . Diabetes Maternal Grandmother    History  Substance Use Topics  . Smoking status: Never Smoker   . Smokeless tobacco: Not on file  . Alcohol Use: No   OB History   Grav Para Term Preterm Abortions TAB SAB Ect Mult Living                 Review of Systems  Respiratory: Positive for shortness of breath.   Gastrointestinal: Negative for blood in stool, abdominal distention and anal bleeding.  Genitourinary: Negative for  hematuria.  Neurological: Positive for dizziness and headaches.  All other systems reviewed and are negative.    Allergies  Bee venom  Home Medications   Current Outpatient Rx  Name  Route  Sig  Dispense  Refill  . SUMAtriptan (IMITREX) 50 MG tablet   Oral   Take 1 tablet (50 mg total) by mouth every 2 (two) hours as needed for migraine. Do not exceed 200mg  in 24 hours (4 pills)   60 tablet   2   . topiramate (TOPAMAX) 50 MG tablet   Oral   Take 1 tablet (50 mg total) by mouth 2 (two) times daily.   60 tablet   2    BP 149/86  Pulse 89  Temp(Src) 98.7 F (37.1 C) (Oral)  Resp 14  Ht 5\' 6"  (1.676 m)  Wt 247 lb 6 oz (112.209 kg)  BMI 39.95 kg/m2  SpO2 100%  LMP 12/26/2013 Physical Exam  Constitutional: She is oriented to person, place, and time. She appears well-developed and well-nourished. No distress.  HENT:  Head: Normocephalic and atraumatic.  Right Ear: Hearing normal.  Left Ear: Hearing normal.  Nose: Nose normal.  Mouth/Throat: Oropharynx is clear and moist and mucous membranes are normal.  Eyes: Conjunctivae and EOM are normal. Pupils are equal, round, and reactive to light.  Neck: Normal range of motion. Neck supple.  Cardiovascular: Regular rhythm, S1  normal and S2 normal.  Exam reveals no gallop and no friction rub.   No murmur heard. Pulmonary/Chest: Effort normal and breath sounds normal. No respiratory distress. She exhibits no tenderness.  Abdominal: Soft. Normal appearance and bowel sounds are normal. There is no hepatosplenomegaly. There is no tenderness. There is no rebound, no guarding, no tenderness at McBurney's point and negative Murphy's sign. No hernia.  Musculoskeletal: Normal range of motion.  Neurological: She is alert and oriented to person, place, and time. She has normal strength. No cranial nerve deficit or sensory deficit. Coordination normal. GCS eye subscore is 4. GCS verbal subscore is 5. GCS motor subscore is 6.  Skin: Skin is  warm, dry and intact. No rash noted. No cyanosis.  Psychiatric: She has a normal mood and affect. Her speech is normal and behavior is normal. Thought content normal.    ED Course  Procedures (including critical care time) Labs Review Labs Reviewed  CBC WITH DIFFERENTIAL - Abnormal; Notable for the following:    RBC 2.93 (*)    Hemoglobin 4.7 (*)    HCT 17.6 (*)    MCV 60.1 (*)    MCH 16.0 (*)    MCHC 26.7 (*)    RDW 20.7 (*)    Platelets 590 (*)    All other components within normal limits  COMPREHENSIVE METABOLIC PANEL - Abnormal; Notable for the following:    Potassium 3.4 (*)    Glucose, Bld 133 (*)    Calcium 8.2 (*)    Albumin 2.8 (*)    Total Bilirubin <0.2 (*)    All other components within normal limits  APTT  PROTIME-INR  VITAMIN B12  FOLATE  IRON AND TIBC  FERRITIN  RETICULOCYTES  LACTATE DEHYDROGENASE  SAMPLE TO BLOOD BANK  DIRECT ANTIGLOBULIN TEST   Imaging Review No results found.  EKG Interpretation   None       MDM  Diagnosis: Severe anemia, unclear etiology  Patient presents to the ER for evaluation of low blood counts. Patient had outpatient blood work which showed profound anemia. Patient is not hypotensive. Clearly the anemia has progressed slowly, but she is symptomatic. The patient was tachycardic here in the ER 140 beats per minute. She has significant exertional dyspnea and dizziness with position changes. She will require transfusion. Additionally, etiology of the anemia is unclear. She is a menstruating female, but denies any heavy bleeding. No other source of bleeding noted by history. Rectal exam was heme negative.  Iron studies, LDH, direct Coombs' test added onto the patient's labs. Because she has become symptomatic and is profoundly anemic, will require hospitalization for further workup and transfusion.    Orpah Greek, MD 12/27/13 [phone]

## 2013-12-27 NOTE — Progress Notes (Signed)
INITIAL NUTRITION ASSESSMENT  DOCUMENTATION CODES Per approved criteria  -Obesity Unspecified   INTERVENTION: 1. Patient educated on high iron food choices. We discussed including a meat source of protein with all meals and snacking on nuts/raisins during the day. Handout provided. Teach back method used.  2. Patient encouraged to eat regularly throughout the day.  3. Recommend iron supplement if medically appropriate.   NUTRITION DIAGNOSIS: Food and nutrition related knowledge deficit related to anemia as evidenced by pt report.   Goal: 1. Patient will state knowledge of high iron foods.  2. Patient will meet >/=90% of estimated nutrition needs  Monitor:  PO intake, education needs, weight, labs  Reason for Assessment: Consult  43 y.o. female  Admitting Dx: Symptomatic anemia  ASSESSMENT: Patient with history of migraine headaches, admitted with weakness and fatigue over the last 3 weeks. Hgb 4.7. Patient has received 3 units of packed red blood cells. No nausea or vomiting reported.   Patient reports fair intake over the last few weeks due to fatigue. She reports making food for children, but not eating much herself. She has limited knowledge of iron content of foods. She denies significant weight loss, ~3 pounds.   Height: Ht Readings from Last 1 Encounters:  12/27/13 5\' 6"  (1.676 m)    Weight: Wt Readings from Last 1 Encounters:  12/27/13 247 lb 6 oz (112.209 kg)    Ideal Body Weight: 130 pounds.  % Ideal Body Weight: 190%  Wt Readings from Last 10 Encounters:  12/27/13 247 lb 6 oz (112.209 kg)  12/26/13 247 lb 6.4 oz (112.22 kg)    Usual Body Weight: 250 pounds  % Usual Body Weight: 99%  BMI:  Body mass index is 39.95 kg/(m^2). Patient is obese, class II.   Estimated Nutritional Needs: Kcal: 2000-2200 kcal Protein: 105-120 g Fluid: 2-2.1 L/day  Skin: Intact  Diet Order: General  EDUCATION NEEDS: -Education needs addressed   Intake/Output  Summary (Last 24 hours) at 12/27/13 1114 Last data filed at 12/27/13 0925  Gross per 24 hour  Intake  324.5 ml  Output      0 ml  Net  324.5 ml    Last BM: PTA   Labs:   Recent Labs Lab 12/26/13 1050 12/27/13 0005  NA 137 140  K 3.8 3.4*  CL 105 105  CO2 25 24  BUN 11 11  CREATININE 0.71 0.77  CALCIUM 8.5 8.2*  GLUCOSE 68* 133*    CBG (last 3)  No results found for this basename: GLUCAP,  in the last 72 hours  Scheduled Meds: . heparin  5,000 Units Subcutaneous Q8H  . pantoprazole  40 mg Oral Q0600  . potassium chloride  40 mEq Oral Once  . sodium chloride  3 mL Intravenous Q12H    Continuous Infusions: . 0.9 % NaCl with KCl 20 mEq / L      Past Medical History  Diagnosis Date  . Migraine   . Anemia   . PTSD (post-traumatic stress disorder)     reports daughter was murdered     History reviewed. No pertinent past surgical history.  Larey Seat, RD, LDN Pager #: [phone] After-Hours Pager #: [phone]

## 2013-12-27 NOTE — ED Notes (Addendum)
The pt was given a rx for imitrex  Today.  She still has the headache.

## 2013-12-27 NOTE — H&P (Signed)
History and Physical Examination   Jahniya Redstone [id number] DOB: Jul 26, 1971 DOA: 12/27/2013  Referring physician: Betsey Holiday, MD PCP: Lorayne Marek, MD   Chief Complaint: Anemia   HPI: Deloyce Lazaroff is a 43 y.o. female  with a past medical history significant for migraine headaches and PTSD who was sent to the emergency department after some lab work obtained at her primary care physician's office revealed that she had a significant anemia.  The patient [facility].  She had some basic labs done at that visit.  She reports that she had been experiencing progressive weakness and dizziness over the past several weeks.  She also reports that she has had some increasing thirst, urination, fatigue and some mild visual visual changes.  The patient denies having chest pain and shortness of breath.  She also reports that she's been tachycardic.  She reports that she has had no nausea vomiting and no melena.  She denies seeing blood in her stools.  She also reports she does not have heavy periods.  She reports that she normally uses 3-4 tampons.  The patient reports that she has not had her hemoglobin tested in the past 2 years.  She reports no history of having anemia.  She reports that she is normally eating a regular diet.  In the emergency department she was Hemoccult tested negative.  The patient reports that she does not use medications to treat her migraines.  She reports that she does not use NSAIDs.  Her hemoglobin was 4.7.  She was ordered to receive 3 units of packed red blood cells and hospitalization was requested for further evaluation and management.  Past Medical History Past Medical History  Diagnosis Date  . Migraine   . Anemia   . PTSD (post-traumatic stress disorder)     reports daughter was murdered    Past Surgical History History reviewed. No pertinent past surgical  history.  Home Meds: Prior to Admission medications   Medication Sig Start Date End Date Taking? Authorizing Provider  SUMAtriptan (IMITREX) 50 MG tablet Take 1 tablet (50 mg total) by mouth every 2 (two) hours as needed for migraine. Do not exceed 200mg  in 24 hours (4 pills) 12/26/13   Reyne Dumas, MD  topiramate (TOPAMAX) 50 MG tablet Take 1 tablet (50 mg total) by mouth 2 (two) times daily. 12/26/13   Reyne Dumas, MD   Allergies: Bee venom  Social History:  History   Social History  . Marital Status: Single    Spouse Name: N/A    Number of Children: N/A  . Years of Education: N/A   Occupational History  . Not on file.   Social History Main Topics  . Smoking status: Never Smoker   . Smokeless tobacco: Not on file  . Alcohol Use: No  . Drug Use: No  . Sexual Activity: Not Currently   Other Topics Concern  . Not on file   Social History Narrative   Pt relocated to [place] from [place] around 2012   Family History:  Family History  Problem Relation Age of Onset  . Hypertension Mother   . Arthritis Mother   . Hypertension Son   . Cancer Maternal Aunt   . Diabetes Maternal Grandmother     Review of Systems:  Review of Systems  Constitutional: Positive for malaise/fatigue and diaphoresis. Negative for fever, chills and weight loss.  HENT: Negative for congestion, ear discharge, ear  pain, hearing loss, nosebleeds, sore throat and tinnitus.   Eyes: Positive for blurred vision, double vision and photophobia. Negative for pain, discharge and redness.  Respiratory: Positive for shortness of breath. Negative for cough, hemoptysis, sputum production, wheezing and stridor.   Cardiovascular: Positive for palpitations and leg swelling. Negative for chest pain, orthopnea, claudication and PND.  Gastrointestinal: Negative for heartburn, nausea, vomiting, abdominal pain, diarrhea, constipation, blood in stool and melena.  Genitourinary: Positive for urgency and  frequency. Negative for dysuria, hematuria and flank pain.  Musculoskeletal: Negative for back pain, falls, joint pain, myalgias and neck pain.  Skin: Negative for itching and rash.  Neurological: Positive for dizziness, weakness and headaches. Negative for tingling, tremors, sensory change, speech change, focal weakness, seizures and loss of consciousness.  Endo/Heme/Allergies: Negative for environmental allergies and polydipsia. Does not bruise/bleed easily.  Psychiatric/Behavioral: Negative for depression, suicidal ideas, hallucinations, memory loss and substance abuse. The patient is not nervous/anxious and does not have insomnia.   All other systems reviewed and are negative.   Physical Exam: Blood pressure 137/74, pulse 81, temperature 98.1 F (36.7 C), temperature source Oral, resp. rate 16, height 5\' 6"  (1.676 m), weight 247 lb 6 oz (112.209 kg), last menstrual period 12/26/2013, SpO2 98.00%. General appearance: alert, cooperative, appears stated age, fatigued and no distress Head: Normocephalic, without obvious abnormality, atraumatic Eyes: conjunctivae/corneas clear. PERRL, EOM's intact. Fundi benign. Nose: Nares normal. Septum midline. Mucosa normal. No drainage or sinus tenderness., no discharge Throat: Dry mucous membranes Neck: no adenopathy, no carotid bruit, no JVD, supple, symmetrical, trachea midline and Thyroid mildly enlarged, no masses palpated Back: symmetric, no curvature. ROM normal. No CVA tenderness. Lungs: clear to auscultation bilaterally and normal percussion bilaterally Heart: S1, S2 normal Abdomen: soft, non-tender; bowel sounds normal; no masses,  no organomegaly Extremities: Mild nonpitting pretibial edema bilateral lower extremities Pulses: 2+ and symmetric Skin: Skin color, texture, turgor normal. No rashes or lesions Lymph nodes: Cervical, supraclavicular, and axillary nodes normal. Neurologic: Alert and oriented X 3, normal strength and tone. Normal  symmetric reflexes. Normal coordination and gait  Lab  And Imaging results:  Results for orders placed during the hospital encounter of 12/27/13 (from the past 24 hour(s))  CBC WITH DIFFERENTIAL     Status: Abnormal   Collection Time    12/27/13 12:05 AM      Result Value Range   WBC 9.4  4.0 - 10.5 K/uL   RBC 2.93 (*) 3.87 - 5.11 MIL/uL   Hemoglobin 4.7 (*) 12.0 - 15.0 g/dL   HCT 17.6 (*) 36.0 - 46.0 %   MCV 60.1 (*) 78.0 - 100.0 fL   MCH 16.0 (*) 26.0 - 34.0 pg   MCHC 26.7 (*) 30.0 - 36.0 g/dL   RDW 20.7 (*) 11.5 - 15.5 %   Platelets 590 (*) 150 - 400 K/uL   Neutrophils Relative % 63  43 - 77 %   Lymphocytes Relative 27  12 - 46 %   Monocytes Relative 8  3 - 12 %   Eosinophils Relative 2  0 - 5 %   Basophils Relative 0  0 - 1 %   Neutro Abs 5.9  1.7 - 7.7 K/uL   Lymphs Abs 2.5  0.7 - 4.0 K/uL   Monocytes Absolute 0.8  0.1 - 1.0 K/uL   Eosinophils Absolute 0.2  0.0 - 0.7 K/uL   Basophils Absolute 0.0  0.0 - 0.1 K/uL   RBC Morphology POLYCHROMASIA PRESENT    COMPREHENSIVE METABOLIC  PANEL     Status: Abnormal   Collection Time    12/27/13 12:05 AM      Result Value Range   Sodium 140  137 - 147 mEq/L   Potassium 3.4 (*) 3.7 - 5.3 mEq/L   Chloride 105  96 - 112 mEq/L   CO2 24  19 - 32 mEq/L   Glucose, Bld 133 (*) 70 - 99 mg/dL   BUN 11  6 - 23 mg/dL   Creatinine, Ser 0.77  0.50 - 1.10 mg/dL   Calcium 8.2 (*) 8.4 - 10.5 mg/dL   Total Protein 7.5  6.0 - 8.3 g/dL   Albumin 2.8 (*) 3.5 - 5.2 g/dL   AST 8  0 - 37 U/L   ALT <5  0 - 35 U/L   Alkaline Phosphatase 75  39 - 117 U/L   Total Bilirubin <0.2 (*) 0.3 - 1.2 mg/dL   GFR calc non Af Amer >90  >90 mL/min   GFR calc Af Amer >90  >90 mL/min  APTT     Status: None   Collection Time    12/27/13 12:05 AM      Result Value Range   aPTT 32  24 - 37 seconds  PROTIME-INR     Status: None   Collection Time    12/27/13 12:05 AM      Result Value Range   Prothrombin Time 13.8  11.6 - 15.2 seconds   INR 1.08  0.00 - 1.49   SAMPLE TO BLOOD BANK     Status: None   Collection Time    12/27/13 12:30 AM      Result Value Range   Blood Bank Specimen SAMPLE AVAILABLE FOR TESTING     Sample Expiration 12/28/2013    TYPE AND SCREEN     Status: None   Collection Time    12/27/13 12:30 AM      Result Value Range   ABO/RH(D) B POS     Antibody Screen NEG     Sample Expiration 12/30/2013     Unit Number [id number]     Blood Component Type RED CELLS,LR     Unit division 00     Status of Unit ISSUED     Transfusion Status OK TO TRANSFUSE     Crossmatch Result Compatible     Unit Number [id number]     Blood Component Type RED CELLS,LR     Unit division 00     Status of Unit ALLOCATED     Transfusion Status OK TO TRANSFUSE     Crossmatch Result Compatible     Unit Number [id number]     Blood Component Type RED CELLS,LR     Unit division 00     Status of Unit ALLOCATED     Transfusion Status OK TO TRANSFUSE     Crossmatch Result Compatible    ABO/RH     Status: None   Collection Time    12/27/13 12:30 AM      Result Value Range   ABO/RH(D) B POS    RETICULOCYTES     Status: Abnormal   Collection Time    12/27/13  3:59 AM      Result Value Range   Retic Ct Pct 1.7  0.4 - 3.1 %   RBC. 3.06 (*) 3.87 - 5.11 MIL/uL   Retic Count, Manual 52.0  19.0 - 186.0 K/uL  LACTATE DEHYDROGENASE     Status: None   Collection Time  12/27/13  3:59 AM      Result Value Range   LDH 189  94 - 250 U/L  DIRECT ANTIGLOBULIN TEST     Status: None   Collection Time    12/27/13  4:00 AM      Result Value Range   DAT, complement NEG     DAT, IgG NEG    OCCULT BLOOD, POC DEVICE     Status: None   Collection Time    12/27/13  4:42 AM      Result Value Range   Fecal Occult Bld NEGATIVE  NEGATIVE  PREPARE RBC (CROSSMATCH)     Status: None   Collection Time    12/27/13  6:00 AM      Result Value Range   Order Confirmation ORDER PROCESSED BY BLOOD BANK     Impression / Plan    Symptomatic anemia - the patient  is currently being transfused with 3 units of packed red blood cells.  An anemia workup has started with an anemia panel including D98, folic acid, iron level, ferritin.  Her initial Hemoccult test was guaiac negative.  I like to Hemoccult stools x3.  Given her low MCV I suspect that she is profoundly iron deficient.  Will continue to monitor CBC closely. Check TSH.  Check vitamin D.  Patient is currently at the end of her menstrual cycle will attempt to monitor menstrual flow.  Check urine toxicology screen.  Check HIV.  Consult dietitian.   Anemia - as above     PTSD (post-traumatic stress disorder) - discussed with patient in detail today, she is reporting that she does not take medications for this and has been stable    Migraine - medications will be ordered as needed, likely her headaches have worsened because of severe anemia.  The patient has not take prophylaxis medications for migraine headaches at this time.  Hypokalemia - replace potassium, monitor BMP, check magnesium level    Tachycardia - related to the profound anemia and dehydration, replacing blood volume and IV fluids, monitor on telemetry    Bilateral leg edema - given her long-standing severe anemia I'm concerned that she may have developed congestive heart failure and therefore will check an echocardiogram    Goiter - check TSH, Free T4, consider thyroid ultrasound    Dehydration - IVFs as above, monitor electrolytes     Dizziness / Generalized weakness - likely this is all related to her profound anemia  Code Status: FULL Family Communication: None at bedside Disposition: Telemetry   Irwin Brakeman MD [place], [place] 12/27/2013, 8:02 AM

## 2013-12-27 NOTE — Progress Notes (Signed)
Chaplain received referral that patient requested a Bible. Brought patient a Bible, for which she was grateful. Patient expressed that she was feeling very anxious. 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 She said she "was on 'go' and forgot to take care of herself." She is also anxious about health changes and lack of knowledge concerning the cause of the health changes. She has support from her mother and her pastor. Chaplain listening empathically to patient's concerns and feelings, provided emotional and spiritual support, prayer, and caring presence. Patient said she "felt better" and was very grateful for chaplain support and the care she is receiving.   [place], [place]

## 2013-12-27 NOTE — ED Notes (Signed)
Pt denies any new symptoms since the blood administration began, denies back pain or chills or anything different than prior to administration.

## 2013-12-27 NOTE — ED Notes (Signed)
Nurse first rounds: Nurse explained delay , updated on wait time and process , respirations unlabored, alert and oriented / denies pain .

## 2013-12-28 DIAGNOSIS — F4310 Post-traumatic stress disorder, unspecified: Secondary | ICD-10-CM

## 2013-12-28 DIAGNOSIS — N390 Urinary tract infection, site not specified: Secondary | ICD-10-CM

## 2013-12-28 DIAGNOSIS — A599 Trichomoniasis, unspecified: Secondary | ICD-10-CM

## 2013-12-28 DIAGNOSIS — R42 Dizziness and giddiness: Secondary | ICD-10-CM

## 2013-12-28 DIAGNOSIS — D509 Iron deficiency anemia, unspecified: Principal | ICD-10-CM

## 2013-12-28 LAB — COMPREHENSIVE METABOLIC PANEL
ALK PHOS: 70 U/L (ref 39–117)
ALT: 6 U/L (ref 0–35)
AST: 9 U/L (ref 0–37)
Albumin: 2.8 g/dL — ABNORMAL LOW (ref 3.5–5.2)
BILIRUBIN TOTAL: 0.2 mg/dL — AB (ref 0.3–1.2)
BUN: 8 mg/dL (ref 6–23)
CHLORIDE: 105 meq/L (ref 96–112)
CO2: 22 meq/L (ref 19–32)
Calcium: 8.2 mg/dL — ABNORMAL LOW (ref 8.4–10.5)
Creatinine, Ser: 0.8 mg/dL (ref 0.50–1.10)
GFR, EST NON AFRICAN AMERICAN: 89 mL/min — AB (ref >90)
GLUCOSE: 81 mg/dL (ref 70–99)
POTASSIUM: 3.8 meq/L (ref 3.7–5.3)
SODIUM: 139 meq/L (ref 137–147)
Total Protein: 7.2 g/dL (ref 6.0–8.3)

## 2013-12-28 LAB — CBC
HCT: 24.1 % — ABNORMAL LOW (ref 36.0–46.0)
Hemoglobin: 7.2 g/dL — ABNORMAL LOW (ref 12.0–15.0)
MCH: 19.6 pg — AB (ref 26.0–34.0)
MCHC: 29.9 g/dL — AB (ref 30.0–36.0)
MCV: 65.7 fL — AB (ref 78.0–100.0)
PLATELETS: 464 10*3/uL — AB (ref 150–400)
RBC: 3.67 MIL/uL — ABNORMAL LOW (ref 3.87–5.11)
RDW: 24.2 % — AB (ref 11.5–15.5)
WBC: 9.6 10*3/uL (ref 4.0–10.5)

## 2013-12-28 LAB — PRO B NATRIURETIC PEPTIDE: PRO B NATRI PEPTIDE: 640.4 pg/mL — AB (ref 0–125)

## 2013-12-28 LAB — HEMOGLOBIN AND HEMATOCRIT, BLOOD
HCT: 24.4 % — ABNORMAL LOW (ref 36.0–46.0)
Hemoglobin: 7.5 g/dL — ABNORMAL LOW (ref 12.0–15.0)

## 2013-12-28 LAB — PREPARE RBC (CROSSMATCH)

## 2013-12-28 LAB — MRSA PCR SCREENING: MRSA by PCR: NEGATIVE

## 2013-12-28 LAB — MAGNESIUM: Magnesium: 2 mg/dL (ref 1.5–2.5)

## 2013-12-28 MED ORDER — FERROUS SULFATE 325 (65 FE) MG PO TABS: 325.0000 mg | ORAL_TABLET | Freq: Two times a day (BID) | ORAL | Status: DC

## 2013-12-28 MED ORDER — IBUPROFEN 400 MG PO TABS
400.0000 mg | ORAL_TABLET | Freq: Four times a day (QID) | ORAL | Status: DC | PRN
  Administered 2013-12-28: 400 mg via ORAL
  Filled 2013-12-28: qty 1

## 2013-12-28 MED ORDER — ACETAMINOPHEN 325 MG PO TABS: 650.0000 mg | ORAL_TABLET | Freq: Four times a day (QID) | ORAL | Status: DC | PRN

## 2013-12-28 MED ORDER — METHOCARBAMOL 500 MG PO TABS
500.0000 mg | ORAL_TABLET | Freq: Once | ORAL | Status: AC
  Administered 2013-12-28: 500 mg via ORAL
  Filled 2013-12-28 (×2): qty 1

## 2013-12-28 MED ORDER — CEFUROXIME AXETIL 500 MG PO TABS
500.0000 mg | ORAL_TABLET | Freq: Two times a day (BID) | ORAL | Status: DC
  Administered 2013-12-28 – 2013-12-29 (×2): 500 mg via ORAL
  Filled 2013-12-28 (×4): qty 1

## 2013-12-28 MED ORDER — CEFUROXIME AXETIL 500 MG PO TABS: 500.0000 mg | ORAL_TABLET | Freq: Two times a day (BID) | ORAL | Status: DC

## 2013-12-28 NOTE — Progress Notes (Signed)
[facility] PROGRESS NOTE  Christina Mcbride QIW:[id number] DOB: 03/22/1971 DOA: 12/27/2013 PCP: Lorayne Marek, MD  Assessment/Plan:  Principal Problem:   Iron deficiency anemia appropriately and improved after 3 units of packed red blood cells. Patient is still reporting dizziness. Will give another unit. Attempted to discharge today, but patient was unable to ambulate due to dizziness. According to nursing staff, patient has been crying since I informed her she may be discharged today. I wonder if her difficult home situation with small children is contributing to her complaints. Social work consult is pending. No need for echocardiogram. She has no pitting edema. Symptoms likely is related to severe anemia. She was to followup with gynecology as an outpatient but has not yet done so. A spine workup negative, the next step would be referral to GI as outpatient. Active Problems:   PTSD (post-traumatic stress disorder)   Migraine: She has a followup visit with neurologist tomorrow.   Tachycardia resolved   Goiter   Dizziness, chronic. She reports it is when she has her headache. Will discontinue opiates, as this may be contributing   Low iron stores   Trichomoniasis: Has received 2 g of Flagyl.   UTI (urinary tract infection): Change to by mouth antibiotics   Code Status:  full Family Communication:   Disposition Plan:  Homeless shelter  Consultants:  Social work  Procedures:     Antibiotics:    HPI/Subjective: Complaining of headache and dizziness. No bleeding.  Objective: Filed Vitals:   12/28/13 0900  BP: 140/84  Pulse: 83  Temp: 97.9 F (36.6 C)  Resp: 18    Intake/Output Summary (Last 24 hours) at 12/28/13 1212 Last data filed at 12/27/13 2300  Gross per 24 hour  Intake 953.33 ml  Output      0 ml  Net 953.33 ml   Filed Weights   12/27/13 0002 12/27/13 2136  Weight: 112.209 kg (247 lb 6 oz) 116.5 kg (256 lb 13.4 oz)    Exam:   General:   Initially, watching TV and talking on her cell phone, without distress. When I arrived, started grasping her forehead, complaining of pain and dizziness  Cardiovascular: Regular rate rhythm without murmurs gallops rubs  Respiratory: Clear to auscultation bilaterally without wheezes rhonchi or rales  Abdomen: Soft nontender nondistended. Obese.  Ext: Sequential compression devices in place. No pitting edema.  Basic Metabolic Panel:  Recent Labs Lab 12/26/13 1050 12/27/13 0005 12/27/13 1115 12/28/13 0751  NA 137 140  --  139  K 3.8 3.4*  --  3.8  CL 105 105  --  105  CO2 25 24  --  22  GLUCOSE 68* 133*  --  81  BUN 11 11  --  8  CREATININE 0.71 0.77 0.75 0.80  CALCIUM 8.5 8.2*  --  8.2*  MG  --   --   --  2.0   Liver Function Tests:  Recent Labs Lab 12/26/13 1050 12/27/13 0005 12/28/13 0751  AST 8 8 9   ALT <8 <5 6  ALKPHOS 74 75 70  BILITOT 0.2 <0.2* 0.2*  PROT 7.6 7.5 7.2  ALBUMIN 3.8 2.8* 2.8*   No results found for this basename: LIPASE, AMYLASE,  in the last 168 hours No results found for this basename: AMMONIA,  in the last 168 hours CBC:  Recent Labs Lab 12/26/13 1050 12/27/13 0005 12/27/13 1115 12/27/13 2327 12/28/13 0751  WBC 9.1 9.4 8.4  --  9.6  NEUTROABS 6.1 5.9  --   --   --  HGB 5.1* 4.7* 5.5* 7.5* 7.2*  HCT 18.6* 17.6* 19.6* 24.4* 24.1*  MCV 57.2* 60.1* 62.0*  --  65.7*  PLT 642* 590* 543*  --  464*   Cardiac Enzymes: No results found for this basename: CKTOTAL, CKMB, CKMBINDEX, TROPONINI,  in the last 168 hours BNP (last 3 results)  Recent Labs  12/27/13 1115 12/28/13 0751  PROBNP 448.1* 640.4*   CBG: No results found for this basename: GLUCAP,  in the last 168 hours  Recent Results (from the past 240 hour(s))  MRSA PCR SCREENING     Status: None   Collection Time    12/28/13  2:35 AM      Result Value Range Status   MRSA by PCR NEGATIVE  NEGATIVE Final   Comment:            The GeneXpert MRSA Assay (FDA     approved for  NASAL specimens     only), is one component of a     comprehensive MRSA colonization     surveillance program. It is not     intended to diagnose MRSA     infection nor to guide or     monitor treatment for     MRSA infections.     Studies: No results found.  Scheduled Meds: . cefUROXime  500 mg Oral BID WC  . ferrous sulfate  325 mg Oral Q breakfast  . heparin  5,000 Units Subcutaneous Q8H  . sodium chloride  3 mL Intravenous Q12H   Continuous Infusions:   Time spent: 35 minutes  [facility] Pager [phone]. If 7PM-7AM, please contact night-coverage at [email or web address], password [facility] 12/28/2013, 12:12 PM  LOS: 1 day

## 2013-12-28 NOTE — Evaluation (Signed)
Physical Therapy Evaluation Patient Details Name: Christina Mcbride MRN: [id number] DOB: 1971/11/23 Today's Date: 12/28/2013 Time: 8366-2947 PT Time Calculation (min): 26 min  PT Assessment / Plan / Recommendation History of Present Illness  Admitted with severe anemia; Recieved multiple blood transfusions; with chronic headaches  Clinical Impression  Pt admitted with above. Pt currently with functional limitations due to the deficits listed below (see PT Problem List).  Pt will benefit from skilled PT to increase their independence and safety with mobility to allow discharge to the venue listed below.       PT Assessment  Patient needs continued PT services    Follow Up Recommendations  No PT follow up    Does the patient have the potential to tolerate intense rehabilitation      Barriers to Discharge        Equipment Recommendations  None recommended by PT    Recommendations for Other Services     Frequency Min 3X/week (Likely just 1 more session)    Precautions / Restrictions Precautions Precautions: Other (comment) Precaution Comments: Closely monitor for activity tolerance   Pertinent Vitals/Pain HA, which pt describes as disorienting Cramping which cause enough pain to make pt stop wht she is doing      Mobility  Bed Mobility Overal bed mobility: Independent Transfers Overall transfer level: Needs assistance Equipment used: None Transfers: Sit to/from Stand Sit to Stand: Min guard General transfer comment: Cues mostly for activity tolerance during transitions Ambulation/Gait Ambulation/Gait assistance: Min guard Ambulation Distance (Feet): 20 Feet Assistive device:  (Pushing IV pole) Gait Pattern/deviations: Step-through pattern Gait velocity: slowed Gait velocity interpretation: Below normal speed for age/gender General Gait Details: Overall moving well; decr distance and speed limited by cramps (?menstrual?) and dizzimess which pt attributes to HA     Exercises     PT Diagnosis: Difficulty walking;Generalized weakness  PT Problem List: Decreased strength;Decreased activity tolerance;Decreased balance;Decreased mobility;Pain PT Treatment Interventions: DME instruction;Gait training;Stair training;Functional mobility training;Therapeutic activities;Therapeutic exercise;Patient/family education     PT Goals(Current goals can be found in the care plan section) Acute Rehab PT Goals Patient Stated Goal: get home PT Goal Formulation: With patient Time For Goal Achievement: 01/04/14 Potential to Achieve Goals: Good  Visit Information  Last PT Received On: 12/28/13 Assistance Needed: +1 History of Present Illness: Admitted with severe anemia; Recieved multiple blood transfusions; with chronic headaches       Prior [place] expects to be discharged to::  (She and sons live at [facility]) Living Arrangements: Children (her boys are with her mother while she is in hospital) Prior Function Level of Independence: Independent Communication Communication: No difficulties    Cognition  Cognition Arousal/Alertness: Awake/alert Behavior During Therapy: WFL for tasks assessed/performed Overall Cognitive Status: Within Functional Limits for tasks assessed    Extremity/Trunk Assessment Upper Extremity Assessment Upper Extremity Assessment: Overall WFL for tasks assessed Lower Extremity Assessment Lower Extremity Assessment: Generalized weakness   Balance    End of Session PT - End of Session Activity Tolerance: Patient limited by fatigue (limited by HA and cramping) Patient left: in chair;with call bell/phone within reach Nurse Communication: Mobility status  GP     Roney Marion [facility] [place], [place]  12/28/2013, 4:05 PM

## 2013-12-29 ENCOUNTER — Encounter: Payer: Self-pay | Admitting: *Deleted

## 2013-12-29 ENCOUNTER — Ambulatory Visit: Payer: Self-pay | Admitting: Neurology

## 2013-12-29 ENCOUNTER — Telehealth: Payer: Self-pay | Admitting: Neurology

## 2013-12-29 LAB — URINE CULTURE: Colony Count: >=100000

## 2013-12-29 LAB — TYPE AND SCREEN
ABO/RH(D): B POS
ANTIBODY SCREEN: NEGATIVE
UNIT DIVISION: 0
UNIT DIVISION: 0
Unit division: 0
Unit division: 0

## 2013-12-29 LAB — CBC
HEMATOCRIT: 28.6 % — AB (ref 36.0–46.0)
Hemoglobin: 8.6 g/dL — ABNORMAL LOW (ref 12.0–15.0)
MCH: 20.4 pg — ABNORMAL LOW (ref 26.0–34.0)
MCHC: 30.1 g/dL (ref 30.0–36.0)
MCV: 67.8 fL — ABNORMAL LOW (ref 78.0–100.0)
Platelets: 518 10*3/uL — ABNORMAL HIGH (ref 150–400)
RBC: 4.22 MIL/uL (ref 3.87–5.11)
RDW: 25.8 % — ABNORMAL HIGH (ref 11.5–15.5)
WBC: 10.3 10*3/uL (ref 4.0–10.5)

## 2013-12-29 LAB — VITAMIN D 25 HYDROXY (VIT D DEFICIENCY, FRACTURES): Vit D, 25-Hydroxy: <10 ng/mL — ABNORMAL LOW (ref 30–89)

## 2013-12-29 MED ORDER — CYCLOBENZAPRINE HCL 5 MG PO TABS: 5.0000 mg | ORAL_TABLET | Freq: Three times a day (TID) | ORAL | Status: DC | PRN

## 2013-12-29 MED ORDER — POTASSIUM CHLORIDE CRYS ER 20 MEQ PO TBCR
40.0000 meq | EXTENDED_RELEASE_TABLET | Freq: Once | ORAL | Status: AC
  Administered 2013-12-29: 40 meq via ORAL
  Filled 2013-12-29: qty 2

## 2013-12-29 MED ORDER — CEFUROXIME AXETIL 500 MG PO TABS: 500.0000 mg | ORAL_TABLET | Freq: Two times a day (BID) | ORAL | Status: DC

## 2013-12-29 MED ORDER — IBUPROFEN 400 MG PO TABS: 400.0000 mg | ORAL_TABLET | Freq: Four times a day (QID) | ORAL | Status: DC | PRN

## 2013-12-29 NOTE — Discharge Summary (Signed)
Physician Discharge Summary  Christina Mcbride IWP:[id number] DOB: 09-May-1971 DOA: 12/27/2013  PCP: Lorayne Marek, MD  Admit date: 12/27/2013 Discharge date: 12/29/2013  Time spent: greater than 30  Recommendations for Outpatient Follow-up:  Monitor H/H  Discharge Diagnoses:  Principal Problem:   Iron deficiency anemia Active Problems:   PTSD (post-traumatic stress disorder)   Migraine   Tachycardia   Goiter   Dizziness   Trichomoniasis   UTI (urinary tract infection) leg cramps hypokalemia  Discharge Condition: stable  Filed Weights   12/27/13 0002 12/27/13 2136  Weight: 112.209 kg (247 lb 6 oz) 116.5 kg (256 lb 13.4 oz)    History of present illness:  43 y.o. female with a past medical history significant for migraine headaches and PTSD who was sent to the emergency department after some lab work obtained at her primary care physician's office revealed that she had a significant anemia. The patient had [facility] of migraine headaches. She had some basic labs done at that visit. She reports that she had been experiencing progressive weakness and dizziness over the past several weeks. She also reports that she has had some increasing thirst, urination, fatigue and some mild visual visual changes. The patient denies having chest pain and shortness of breath. She also reports that she's been tachycardic. She reports that she has had no nausea vomiting and no melena. She denies seeing blood in her stools. She also reports she does not have heavy periods. She reports that she normally uses 3-4 tampons. The patient reports that she has not had her hemoglobin tested in the past 2 years. She reports no history of having anemia. She reports that she is normally eating a regular diet. In the emergency department she was Hemoccult tested negative. The patient reports that she does not use medications to treat her migraines.  She reports that she does not use NSAIDs. Her hemoglobin was 4.7. She was ordered to receive 3 units of packed red blood cells and hospitalization was requested for further evaluation and management.  Hospital Course:  Transfused 2 units PRBC to hgb >7. Given 2 gm flagyl for trichomonas and recommend partner be evaluated. Started on rocephin, then ceftin for UTI.  Still felt too dizzy to ambulate more than a few feet, so was transfused another unit PRBC with good results.  PT evaluated. C/o chronic headache. Has appointment with [facility] this afternoon, so discharged early this am.  Leg cramps responded to potassium repletion and robaxin.  Needs to f/u with gyn as outpt. Possibly GI if no etiolgy for iron deficiency found.  Procedures:  Transfusion of 3 units PRBC  Consultations:  none  Discharge Exam: Filed Vitals:   12/29/13 0432  BP: 146/98  Pulse:   Temp:   Resp:    General: comfortable Cardiovascular: RRR with without MGR Respiratory: CTA without WRR abd s, nt nd EXT no CCE  Discharge Instructions  Discharge Orders   Future Appointments Provider Department Dept Phone   12/29/2013 1:30 PM Kathrynn Ducking, MD [facility] [phone]   01/08/2014 2:00 PM [facility] [phone]   Please arrive 15 minutes prior to your appointment time.   01/08/2014 3:00 PM [facility] [phone]   Please arrive 15 minutes prior to your appointment time.   03/02/2014 11:30 AM Lorayne Marek, MD [place] [facility] [phone]   Future Orders Complete By  Expires   Activity as tolerated - No restrictions  As directed    Diet general  As directed    Diet general  As directed    Increase activity slowly  As directed        Medication List         acetaminophen 325 MG tablet  Commonly known as:  TYLENOL  Take 2 tablets (650 mg total) by mouth every 6 (six) hours as  needed for mild pain (or Fever >/= 101).     cefUROXime 500 MG tablet  Commonly known as:  CEFTIN  Take 1 tablet (500 mg total) by mouth 2 (two) times daily with a meal.     cyclobenzaprine 5 MG tablet  Commonly known as:  FLEXERIL  Take 1 tablet (5 mg total) by mouth 3 (three) times daily as needed for muscle spasms.     ferrous sulfate 325 (65 FE) MG tablet  Take 1 tablet (325 mg total) by mouth 2 (two) times daily with a meal.     ibuprofen 400 MG tablet  Commonly known as:  ADVIL,MOTRIN  Take 1 tablet (400 mg total) by mouth every 6 (six) hours as needed for moderate pain.     SUMAtriptan 50 MG tablet  Commonly known as:  IMITREX  Take 1 tablet (50 mg total) by mouth every 2 (two) hours as needed for migraine. Do not exceed 200mg  in 24 hours (4 pills)     topiramate 50 MG tablet  Commonly known as:  TOPAMAX  Take 1 tablet (50 mg total) by mouth 2 (two) times daily.       Allergies  Allergen Reactions  . Bee Venom Anaphylaxis       Follow-up Information   Follow up with [facility]    . Schedule an appointment as soon as possible for a visit in 1 week. [facility] Followup and check hemoglobin)    Contact information:   [place] [address] [phone]      Schedule an appointment as soon as possible for a visit with [facility].   Contact information:   [place] [address] [phone]       The results of significant diagnostics from this hospitalization (including imaging, microbiology, ancillary and laboratory) are listed below for reference.    Significant Diagnostic Studies: No results found.  Microbiology: Recent Results (from the past 240 hour(s))  MRSA PCR SCREENING     Status: None   Collection Time    12/28/13  2:35 AM      Result Value Range Status   MRSA by PCR NEGATIVE  NEGATIVE Final   Comment:            The GeneXpert MRSA Assay (FDA     approved for  NASAL specimens     only), is one component of a     comprehensive MRSA colonization     surveillance program. It is not     intended to diagnose MRSA     infection nor to guide or     monitor treatment for     MRSA infections.     Labs: Basic Metabolic Panel:  Recent Labs Lab 12/26/13 1050 12/27/13 0005 12/27/13 1115 12/28/13 0751  NA 137 140  --  139  K 3.8 3.4*  --  3.8  CL 105 105  --  105  CO2 25 24  --  22  GLUCOSE 68*  133*  --  81  BUN 11 11  --  8  CREATININE 0.71 0.77 0.75 0.80  CALCIUM 8.5 8.2*  --  8.2*  MG  --   --   --  2.0   Liver Function Tests:  Recent Labs Lab 12/26/13 1050 12/27/13 0005 12/28/13 0751  AST 8 8 9   ALT <8 <5 6  ALKPHOS 74 75 70  BILITOT 0.2 <0.2* 0.2*  PROT 7.6 7.5 7.2  ALBUMIN 3.8 2.8* 2.8*   No results found for this basename: LIPASE, AMYLASE,  in the last 168 hours No results found for this basename: AMMONIA,  in the last 168 hours CBC:  Recent Labs Lab 12/26/13 1050 12/27/13 0005 12/27/13 1115 12/27/13 2327 12/28/13 0751 12/29/13 0700  WBC 9.1 9.4 8.4  --  9.6 10.3  NEUTROABS 6.1 5.9  --   --   --   --   HGB 5.1* 4.7* 5.5* 7.5* 7.2* 8.6*  HCT 18.6* 17.6* 19.6* 24.4* 24.1* 28.6*  MCV 57.2* 60.1* 62.0*  --  65.7* 67.8*  PLT 642* 590* 543*  --  464* 518*   Cardiac Enzymes: No results found for this basename: CKTOTAL, CKMB, CKMBINDEX, TROPONINI,  in the last 168 hours BNP: BNP (last 3 results)  Recent Labs  12/27/13 1115 12/28/13 0751  PROBNP 448.1* 640.4*   CBG: No results found for this basename: GLUCAP,  in the last 168 hours     Signed:  Strawn L  [facility] 12/29/2013, 7:49 AM

## 2013-12-29 NOTE — Telephone Encounter (Signed)
This patient did not show for a new patient appointment today. 

## 2013-12-29 NOTE — Progress Notes (Signed)
Patient discharge teaching given, including activity, diet, follow-up appoints, and medications. Patient verbalized understanding of all discharge instructions. IV access was d/c'd. Vitals are stable. Skin is intact except as charted in most recent assessments. Pt to be escorted out by NT, to be driven home by family.  Sultan Pargas, MBA, BS, RN 

## 2013-12-29 NOTE — Progress Notes (Signed)
Late Entry - Patient c/o of severe upper thigh cramping.  She rates cramping as 9/10.  She states that this occurs frequently and eventually will get better if she keeps moving her legs.  [facility] called and made aware.  Received order for Robaxin PO.  This was given at 2255 with good relief.  Will continue to monitor patient.  Lorree Millar RN, [facility]

## 2013-12-30 ENCOUNTER — Telehealth: Payer: Self-pay | Admitting: *Deleted

## 2013-12-30 NOTE — Telephone Encounter (Signed)
Scheduled pt for another appointment.

## 2013-12-30 NOTE — Telephone Encounter (Signed)
Message copied by Chelsi Warr, [place] R on Tue Dec 30, 2013  2:33 PM ------      Message from: Allyson Sabal MD, [facility]      Created: Tue Dec 30, 2013 10:35 AM       Note is that the patient was recently hospitalized for severe anemia with a hemoglobin of 5.1. He scheduled the patient followup in 2 weeks. She will need a repeat CBC. Also needs a stat gynecology referral. She should refrain from taking ibuprofen, Aleve, Naprosyn. She has been prescribed Imitrex and Topamax for headache ------

## 2014-01-06 ENCOUNTER — Ambulatory Visit: Payer: Medicaid Other | Attending: Internal Medicine | Admitting: Internal Medicine

## 2014-01-06 VITALS — BP 151/88 | HR 78 | Temp 98.8°F | Resp 14 | Ht 67.0 in | Wt 249.2 lb

## 2014-01-06 DIAGNOSIS — R51 Headache: Secondary | ICD-10-CM | POA: Insufficient documentation

## 2014-01-06 DIAGNOSIS — R519 Headache, unspecified: Secondary | ICD-10-CM

## 2014-01-06 DIAGNOSIS — D649 Anemia, unspecified: Secondary | ICD-10-CM | POA: Insufficient documentation

## 2014-01-06 LAB — CBC WITH DIFFERENTIAL/PLATELET
Basophils Absolute: 0 10*3/uL (ref 0.0–0.1)
Basophils Relative: 1 % (ref 0–1)
EOS ABS: 0.2 10*3/uL (ref 0.0–0.7)
Eosinophils Relative: 3 % (ref 0–5)
HCT: 30.1 % — ABNORMAL LOW (ref 36.0–46.0)
HEMOGLOBIN: 9.2 g/dL — AB (ref 12.0–15.0)
Lymphocytes Relative: 16 % (ref 12–46)
Lymphs Abs: 1.3 10*3/uL (ref 0.7–4.0)
MCH: 20.2 pg — AB (ref 26.0–34.0)
MCHC: 30.6 g/dL (ref 30.0–36.0)
MCV: 66.2 fL — ABNORMAL LOW (ref 78.0–100.0)
MONOS PCT: 8 % (ref 3–12)
Monocytes Absolute: 0.7 10*3/uL (ref 0.1–1.0)
NEUTROS ABS: 5.8 10*3/uL (ref 1.7–7.7)
Neutrophils Relative %: 72 % (ref 43–77)
Platelets: 483 10*3/uL — ABNORMAL HIGH (ref 150–400)
RBC: 4.55 MIL/uL (ref 3.87–5.11)
RDW: 29.1 % — ABNORMAL HIGH (ref 11.5–15.5)
WBC: 8 10*3/uL (ref 4.0–10.5)

## 2014-01-06 MED ORDER — PANTOPRAZOLE SODIUM 40 MG PO TBEC: 40.0000 mg | DELAYED_RELEASE_TABLET | Freq: Every day | ORAL | Status: DC

## 2014-01-06 MED ORDER — VITAMIN D (ERGOCALCIFEROL) 1.25 MG (50000 UNIT) PO CAPS: 50000.0000 [IU] | ORAL_CAPSULE | ORAL | Status: DC

## 2014-01-06 MED ORDER — FERROUS SULFATE 325 (65 FE) MG PO TABS: 325.0000 mg | ORAL_TABLET | Freq: Two times a day (BID) | ORAL | Status: DC

## 2014-01-06 NOTE — Progress Notes (Signed)
Patient ID: Christina Mcbride, female   DOB: 02-07-1971, 43 y.o.   MRN: [id number]   CC:  HPI: Patient recently hospitalized 1/31-2/2 for severe anemia. The patient had hemoglobin of 4.8, refer to the ER, admitted for transfusion of 3 units of packed red blood cells. Stool guaiac was negative. X1. She has been on iron supplementation. She currently does not report any ongoing bleeding GI or vaginal  She has a history of chronic headaches for which she has been taking ibuprofen. Patient had an MRI/MRA scheduled on the 12th and was denied by Medicaid. She had a CT of the head in August that was negative. Because of the denial the patient is being switched back to a CT of the head. She had an appointment with Dr.Charles Loreta Ave, MD on 2/2, however she was a no show for this appointment because she had to take care of her children, and she is trying to reschedule this appointment.      Allergies  Allergen Reactions  . Bee Venom Anaphylaxis   Past Medical History  Diagnosis Date  . Migraine   . Anemia   . PTSD (post-traumatic stress disorder)     reports daughter was murdered    Current Outpatient Prescriptions on File Prior to Visit  Medication Sig Dispense Refill  . acetaminophen (TYLENOL) 325 MG tablet Take 2 tablets (650 mg total) by mouth every 6 (six) hours as needed for mild pain (or Fever >/= 101).      . cefUROXime (CEFTIN) 500 MG tablet Take 1 tablet (500 mg total) by mouth 2 (two) times daily with a meal.  6 tablet  0  . cyclobenzaprine (FLEXERIL) 5 MG tablet Take 1 tablet (5 mg total) by mouth 3 (three) times daily as needed for muscle spasms.  20 tablet  0  . SUMAtriptan (IMITREX) 50 MG tablet Take 1 tablet (50 mg total) by mouth every 2 (two) hours as needed for migraine. Do not exceed 200mg  in 24 hours (4 pills)  60 tablet  2  . topiramate (TOPAMAX) 50 MG tablet Take 1 tablet (50 mg total) by mouth 2 (two) times daily.  60 tablet  2   No current facility-administered  medications on file prior to visit.   Family History  Problem Relation Age of Onset  . Hypertension Mother   . Arthritis Mother   . Hypertension Son   . Cancer Maternal Aunt   . Diabetes Maternal Grandmother    History   Social History  . Marital Status: Single    Spouse Name: N/A    Number of Children: N/A  . Years of Education: N/A   Occupational History  . Not on file.   Social History Main Topics  . Smoking status: Never Smoker   . Smokeless tobacco: Never Used  . Alcohol Use: No  . Drug Use: No  . Sexual Activity: Not Currently   Other Topics Concern  . Not on file   Social History Narrative   Pt relocated to [place] from [place] around 2012    Review of Systems  Constitutional: Negative for fever, chills, diaphoresis, activity change, appetite change and fatigue.  HENT: Negative for ear pain, nosebleeds, congestion, facial swelling, rhinorrhea, neck pain, neck stiffness and ear discharge.   Eyes: Negative for pain, discharge, redness, itching and visual disturbance.  Respiratory: Negative for cough, choking, chest tightness, shortness of breath, wheezing and stridor.   Cardiovascular: Negative for chest pain, palpitations and leg swelling.  Gastrointestinal: Negative for  abdominal distention.  Genitourinary: Negative for dysuria, urgency, frequency, hematuria, flank pain, decreased urine volume, difficulty urinating and dyspareunia.  Musculoskeletal: Negative for back pain, joint swelling, arthralgias and gait problem.  Neurological: Negative for dizziness, tremors, seizures, syncope, facial asymmetry, speech difficulty, weakness, light-headedness, numbness and headaches.  Hematological: Negative for adenopathy. Does not bruise/bleed easily.  Psychiatric/Behavioral: Negative for hallucinations, behavioral problems, confusion, dysphoric mood, decreased concentration and agitation.    Objective:   Filed Vitals:   01/06/14 1020  BP: 151/88  Pulse: 78   Temp: 98.8 F (37.1 C)  Resp: 14    Physical Exam  Constitutional: Appears well-developed and well-nourished. No distress.  HENT: Normocephalic. External right and left ear normal. Oropharynx is clear and moist.  Eyes: Conjunctivae and EOM are normal. PERRLA, no scleral icterus.  Neck: Normal ROM. Neck supple. No JVD. No tracheal deviation. No thyromegaly.  CVS: RRR, S1/S2 +, no murmurs, no gallops, no carotid bruit.  Pulmonary: Effort and breath sounds normal, no stridor, rhonchi, wheezes, rales.  Abdominal: Soft. BS +,  no distension, tenderness, rebound or guarding.  Musculoskeletal: Normal range of motion. No edema and no tenderness.  Lymphadenopathy: No lymphadenopathy noted, cervical, inguinal. Neuro: Alert. Normal reflexes, muscle tone coordination. No cranial nerve deficit. Skin: Skin is warm and dry. No rash noted. Not diaphoretic. No erythema. No pallor.  Psychiatric: Normal mood and affect. Behavior, judgment, thought content normal.   Lab Results  Component Value Date   WBC 10.3 12/29/2013   HGB 8.6* 12/29/2013   HCT 28.6* 12/29/2013   MCV 67.8* 12/29/2013   PLT 518* 12/29/2013   Lab Results  Component Value Date   CREATININE 0.80 12/28/2013   BUN 8 12/28/2013   NA 139 12/28/2013   K 3.8 12/28/2013   CL 105 12/28/2013   CO2 22 12/28/2013    Lab Results  Component Value Date   HGBA1C 5.7* 12/27/2013   Lipid Panel     Component Value Date/Time   CHOL 181 12/26/2013 1050   TRIG 102 12/26/2013 1050   HDL 57 12/26/2013 1050   CHOLHDL 3.2 12/26/2013 1050   VLDL 20 12/26/2013 1050   LDLCALC 104* 12/26/2013 1050       Assessment and plan:   Patient Active Problem List   Diagnosis Date Noted  . Anemia 12/27/2013  . Symptomatic anemia 12/27/2013  . Tachycardia 12/27/2013  . Bilateral leg edema 12/27/2013  . Goiter 12/27/2013  . Dehydration 12/27/2013  . Dizziness 12/27/2013  . Generalized weakness 12/27/2013  . Iron deficiency anemia 12/27/2013  . Low iron stores 12/27/2013   . Trichomoniasis 12/27/2013  . UTI (urinary tract infection) 12/27/2013  . PTSD (post-traumatic stress disorder)   . Migraine        Severe anemia Last hemoglobin was 8.6 Patient feels significantly better after receiving transfusion Currently not taking any NSAIDs Started on a PPI today Although stool back as negative given history most likely she has a gastrointestinal source of bleeding Gastroenterology referral is being provided If GI workup is negative the patient will probably need to be referred to hematology    Chronic headache Reschedule neurology appointment  Follow up in 2 months      The patient was given clear instructions to go to ER or return to medical center if symptoms don't improve, worsen or new problems develop. The patient verbalized understanding. The patient was told to call to get any lab results if not heard anything in the next week.

## 2014-01-06 NOTE — Progress Notes (Signed)
Pt is here for a f/u for headaches. Complains of nasal congestion, runny nose, sneezing, and tiredness x2 days. Still has headaches, but not as severe. No other symptoms today.

## 2014-01-07 ENCOUNTER — Telehealth: Payer: Self-pay | Admitting: *Deleted

## 2014-01-07 NOTE — Telephone Encounter (Signed)
Message copied by Jenya Putz, [place] R on Wed Jan 07, 2014  3:02 PM ------      Message from: Allyson Sabal MD, [facility]      Created: Wed Jan 07, 2014  1:35 PM       Notify patient hemoglobin is 9.2. Continue to take iron tablets and follow up with gastroenterology ------

## 2014-01-07 NOTE — Telephone Encounter (Signed)
First call: Mobile telephone was not available. Second call: Left a voicemail on patients home number for patient to give [place] a call back.

## 2014-01-08 ENCOUNTER — Ambulatory Visit (HOSPITAL_COMMUNITY): Payer: Medicaid Other

## 2014-01-09 ENCOUNTER — Ambulatory Visit (HOSPITAL_COMMUNITY): Payer: Medicaid Other

## 2014-03-02 ENCOUNTER — Ambulatory Visit: Payer: Medicaid Other | Admitting: Internal Medicine

## 2014-03-06 ENCOUNTER — Ambulatory Visit: Payer: Medicaid Other | Attending: Internal Medicine | Admitting: Internal Medicine

## 2014-03-06 ENCOUNTER — Encounter: Payer: Self-pay | Admitting: Internal Medicine

## 2014-03-06 ENCOUNTER — Telehealth: Payer: Self-pay

## 2014-03-06 ENCOUNTER — Ambulatory Visit (HOSPITAL_COMMUNITY)
Admission: RE | Admit: 2014-03-06 | Discharge: 2014-03-06 | Disposition: A | Discharge status: Home / Self Care | Payer: Medicaid Other | Source: Ambulatory Visit | Attending: Internal Medicine | Admitting: Internal Medicine

## 2014-03-06 VITALS — BP 125/86 | HR 85 | Temp 98.4°F | Resp 16 | Ht 66.5 in | Wt 253.0 lb

## 2014-03-06 DIAGNOSIS — R51 Headache: Secondary | ICD-10-CM

## 2014-03-06 DIAGNOSIS — G8929 Other chronic pain: Secondary | ICD-10-CM | POA: Insufficient documentation

## 2014-03-06 DIAGNOSIS — R519 Headache, unspecified: Secondary | ICD-10-CM

## 2014-03-06 DIAGNOSIS — M25512 Pain in left shoulder: Secondary | ICD-10-CM

## 2014-03-06 DIAGNOSIS — M25519 Pain in unspecified shoulder: Secondary | ICD-10-CM | POA: Insufficient documentation

## 2014-03-06 DIAGNOSIS — D500 Iron deficiency anemia secondary to blood loss (chronic): Secondary | ICD-10-CM | POA: Insufficient documentation

## 2014-03-06 DIAGNOSIS — D509 Iron deficiency anemia, unspecified: Secondary | ICD-10-CM

## 2014-03-06 MED ORDER — TRAMADOL HCL 50 MG PO TABS: 50.0000 mg | ORAL_TABLET | Freq: Three times a day (TID) | ORAL | Status: DC | PRN

## 2014-03-06 NOTE — Progress Notes (Signed)
MRN: [id number] Name: Christina Mcbride  Sex: female Age: 44 y.o. DOB: 1971-07-27  Allergies: Bee venom  Chief Complaint  Patient presents with  . Follow-up    HPI: Patient is 43 y.o. female who has history of migraine headaches, and deficiency anemia patient has been taking her medications, today her main concern is left shoulder pain for the last 4 weeks, she denies any fall or trauma denies any numbness or weakness, she has limited range of motion and has not been able to lift her arm, she has tried over-the-counter Tylenol without much improvement.  Past Medical History  Diagnosis Date  . Migraine   . Anemia   . PTSD (post-traumatic stress disorder)     reports daughter was murdered     History reviewed. No pertinent past surgical history.    Medication List       This list is accurate as of: 03/06/14 11:23 AM.  Always use your most recent med list.               acetaminophen 325 MG tablet  Commonly known as:  TYLENOL  Take 2 tablets (650 mg total) by mouth every 6 (six) hours as needed for mild pain (or Fever >/= 101).     cefUROXime 500 MG tablet  Commonly known as:  CEFTIN  Take 1 tablet (500 mg total) by mouth 2 (two) times daily with a meal.     cyclobenzaprine 5 MG tablet  Commonly known as:  FLEXERIL  Take 1 tablet (5 mg total) by mouth 3 (three) times daily as needed for muscle spasms.     ferrous sulfate 325 (65 FE) MG tablet  Take 1 tablet (325 mg total) by mouth 2 (two) times daily with a meal.     pantoprazole 40 MG tablet  Commonly known as:  PROTONIX  Take 1 tablet (40 mg total) by mouth daily.     SUMAtriptan 50 MG tablet  Commonly known as:  IMITREX  Take 1 tablet (50 mg total) by mouth every 2 (two) hours as needed for migraine. Do not exceed 200mg  in 24 hours (4 pills)     topiramate 50 MG tablet  Commonly known as:  TOPAMAX  Take 1 tablet (50 mg total) by mouth 2 (two) times daily.     traMADol 50 MG tablet  Commonly known as:   ULTRAM  Take 1 tablet (50 mg total) by mouth every 8 (eight) hours as needed for moderate pain.     Vitamin D (Ergocalciferol) 50000 UNITS Caps capsule  Commonly known as:  DRISDOL  Take 1 capsule (50,000 Units total) by mouth every 7 (seven) days.        Meds ordered this encounter  Medications  . traMADol (ULTRAM) 50 MG tablet    Sig: Take 1 tablet (50 mg total) by mouth every 8 (eight) hours as needed for moderate pain.    Dispense:  60 tablet    Refill:  0    Immunization History  Administered Date(s) Administered  . Influenza,trivalent, recombinat, inj, PF 12/26/2013    Family History  Problem Relation Age of Onset  . Hypertension Mother   . Arthritis Mother   . Hypertension Son   . Cancer Maternal Aunt   . Diabetes Maternal Grandmother     History  Substance Use Topics  . Smoking status: Never Smoker   . Smokeless tobacco: Never Used  . Alcohol Use: No    Review of Systems   As  noted in HPI  Filed Vitals:   03/06/14 1100  BP: 125/86  Pulse: 85  Temp: 98.4 F (36.9 C)  Resp: 16    Physical Exam  Physical Exam  Eyes: EOM are normal. Pupils are equal, round, and reactive to light.  Cardiovascular: Normal rate and regular rhythm.   Pulmonary/Chest: Breath sounds normal. No respiratory distress. She has no wheezes. She has no rales.  Musculoskeletal:  Left shoulder tenderness limited ROM, limited abduction, hand grip Ok 2+ radial pulse     CBC    Component Value Date/Time   WBC 8.0 01/06/2014 1049   RBC 4.55 01/06/2014 1049   RBC 3.06* 12/27/2013 0359   HGB 9.2* 01/06/2014 1049   HCT 30.1* 01/06/2014 1049   PLT 483* 01/06/2014 1049   MCV 66.2* 01/06/2014 1049   LYMPHSABS 1.3 01/06/2014 1049   MONOABS 0.7 01/06/2014 1049   EOSABS 0.2 01/06/2014 1049   BASOSABS 0.0 01/06/2014 1049    CMP     Component Value Date/Time   NA 139 12/28/2013 0751   K 3.8 12/28/2013 0751   CL 105 12/28/2013 0751   CO2 22 12/28/2013 0751   GLUCOSE 81 12/28/2013 0751   BUN 8  12/28/2013 0751   CREATININE 0.80 12/28/2013 0751   CREATININE 0.71 12/26/2013 1050   CALCIUM 8.2* 12/28/2013 0751   PROT 7.2 12/28/2013 0751   ALBUMIN 2.8* 12/28/2013 0751   AST 9 12/28/2013 0751   ALT 6 12/28/2013 0751   ALKPHOS 70 12/28/2013 0751   BILITOT 0.2* 12/28/2013 0751   GFRNONAA 89* 12/28/2013 0751   GFRNONAA >89 12/26/2013 1050   GFRAA >90 12/28/2013 0751   GFRAA >89 12/26/2013 1050    Lab Results  Component Value Date/Time   CHOL 181 12/26/2013 10:50 AM    No components found with this basename: hga1c    Lab Results  Component Value Date/Time   AST 9 12/28/2013  7:51 AM    Assessment and Plan  Left shoulder pain - Plan: traMADol (ULTRAM) 50 MG tablet, DG Shoulder Left, Ambulatory referral to Orthopedic Surgery  Chronic headaches Continue with Topamax and Imitrex as prescribed.  Anemia, iron deficiency Advise patient take iron supplements 3 times a day, her last CBC showed improvement in hemoglobin.    Return in about 3 months (around 06/05/2014), or if symptoms worsen or fail to improve.  Lorayne Marek, MD

## 2014-03-06 NOTE — Telephone Encounter (Signed)
Message copied by Dorothe Pea on Fri Mar 06, 2014  4:59 PM ------      Message from: Lorayne Marek      Created: Fri Mar 06, 2014  4:52 PM       Call and let the patient know that her xray reported      Mild osteoarthritic changes.  No acute osseous abnormalities.      Advise patient to take pain medication PRN and follow with Orthopedics ------

## 2014-03-06 NOTE — Telephone Encounter (Signed)
Patient not available Left message on voice mail to return our call 

## 2014-03-06 NOTE — Progress Notes (Signed)
Pt is here following up on her chronic migraines. For about 4 weeks pt has been having pain in her shoulders. She can barely raise her left arm.

## 2014-03-18 ENCOUNTER — Encounter (HOSPITAL_COMMUNITY): Payer: Self-pay | Admitting: Emergency Medicine

## 2014-03-18 ENCOUNTER — Emergency Department (HOSPITAL_COMMUNITY)
Admission: EM | Admit: 2014-03-18 | Discharge: 2014-03-18 | Disposition: A | Discharge status: Home / Self Care | Payer: Medicaid Other | Attending: Emergency Medicine | Admitting: Emergency Medicine

## 2014-03-18 ENCOUNTER — Emergency Department (HOSPITAL_COMMUNITY): Payer: Medicaid Other

## 2014-03-18 DIAGNOSIS — Z79899 Other long term (current) drug therapy: Secondary | ICD-10-CM | POA: Insufficient documentation

## 2014-03-18 DIAGNOSIS — D649 Anemia, unspecified: Secondary | ICD-10-CM | POA: Insufficient documentation

## 2014-03-18 DIAGNOSIS — Z792 Long term (current) use of antibiotics: Secondary | ICD-10-CM | POA: Insufficient documentation

## 2014-03-18 DIAGNOSIS — G43909 Migraine, unspecified, not intractable, without status migrainosus: Secondary | ICD-10-CM | POA: Insufficient documentation

## 2014-03-18 DIAGNOSIS — Z8659 Personal history of other mental and behavioral disorders: Secondary | ICD-10-CM | POA: Insufficient documentation

## 2014-03-18 DIAGNOSIS — M25519 Pain in unspecified shoulder: Secondary | ICD-10-CM | POA: Insufficient documentation

## 2014-03-18 DIAGNOSIS — M25511 Pain in right shoulder: Secondary | ICD-10-CM

## 2014-03-18 MED ORDER — HYDROCODONE-ACETAMINOPHEN 5-325 MG PO TABS
2.0000 | ORAL_TABLET | Freq: Once | ORAL | Status: AC
  Administered 2014-03-18: 2 via ORAL
  Filled 2014-03-18: qty 2

## 2014-03-18 MED ORDER — HYDROCODONE-ACETAMINOPHEN 5-325 MG PO TABS: 1.0000 | ORAL_TABLET | ORAL | Status: DC | PRN

## 2014-03-18 MED ORDER — EPINEPHRINE 0.3 MG/0.3ML IJ SOAJ: 0.3000 mg | Freq: Once | INTRAMUSCULAR | Status: DC | PRN

## 2014-03-18 NOTE — ED Provider Notes (Signed)
Medical screening examination/treatment/procedure(s) were performed by non-physician practitioner and as supervising physician I was immediately available for consultation/collaboration.   EKG Interpretation None        Alfonzo Feller, DO 03/18/14 1915

## 2014-03-18 NOTE — ED Notes (Signed)
Pt presents to department for evaluation of R shoulder pain. Onset this morning. 5/10 pain at the time, increases with movement. Pt is alert and oriented x4. No signs of distress noted.

## 2014-03-18 NOTE — Discharge Instructions (Signed)
Take Vicodin as needed for pain. Refer to attached documents for more information. Refer to attached documents for more information. Follow up with the recommended Orthopedic surgeon.

## 2014-03-18 NOTE — ED Provider Notes (Signed)
CSN: [id number]     Arrival date & time 03/18/14  0917 History  This chart was scribed for non-physician practitioner working with Alvina Chou , by Allena Earing ED Scribe. This patient was seen in TR08C/TR08C and the patient's care was started at 9:30 AM.    Chief Complaint  Patient presents with  . Shoulder Pain      The history is provided by the patient. No language interpreter was used.    HPI Comments: Christina Mcbride is a 43 y.o. female who presents to the Emergency Department complaining of worsening right shoulder pain that began that began recently, she states that it is similar to what she has experienced with her left should. She has been by PCP, specialist for her left shoulder and was diagnosed with osteoarthritis. She states that that the pain radiates down her arm and describes it as a "burning" pain.  Pt states that she wants to be able to "wrestle" with her sons and states that the pain "is causing her distress". She has been taking flexeril, tylenol for her shoulder, to no effect.  Pt wants a referral to a surgeon.  Past Medical History  Diagnosis Date  . Migraine   . Anemia   . PTSD (post-traumatic stress disorder)     reports daughter was murdered    History reviewed. No pertinent past surgical history. Family History  Problem Relation Age of Onset  . Hypertension Mother   . Arthritis Mother   . Hypertension Son   . Cancer Maternal Aunt   . Diabetes Maternal Grandmother    History  Substance Use Topics  . Smoking status: Never Smoker   . Smokeless tobacco: Never Used  . Alcohol Use: No   OB History   Grav Para Term Preterm Abortions TAB SAB Ect Mult Living                 Review of Systems  Constitutional: Positive for activity change.  Musculoskeletal: Positive for arthralgias.       Right shoulder pain   Psychiatric/Behavioral: Negative for confusion.  All other systems reviewed and are negative.     Allergies  Bee venom  Home  Medications   Prior to Admission medications   Medication Sig Start Date End Date Taking? Authorizing Provider  acetaminophen (TYLENOL) 325 MG tablet Take 2 tablets (650 mg total) by mouth every 6 (six) hours as needed for mild pain (or Fever >/= 101). 12/28/13  Yes Delfina Redwood, MD  cefUROXime (CEFTIN) 500 MG tablet Take 1 tablet (500 mg total) by mouth 2 (two) times daily with a meal. 12/29/13  Yes Delfina Redwood, MD  cyclobenzaprine (FLEXERIL) 5 MG tablet Take 1 tablet (5 mg total) by mouth 3 (three) times daily as needed for muscle spasms. 12/29/13  Yes Delfina Redwood, MD  ferrous sulfate 325 (65 FE) MG tablet Take 1 tablet (325 mg total) by mouth 2 (two) times daily with a meal. 01/06/14  Yes Reyne Dumas, MD  pantoprazole (PROTONIX) 40 MG tablet Take 1 tablet (40 mg total) by mouth daily. 01/06/14  Yes Reyne Dumas, MD  SUMAtriptan (IMITREX) 50 MG tablet Take 1 tablet (50 mg total) by mouth every 2 (two) hours as needed for migraine. Do not exceed 200mg  in 24 hours (4 pills) 12/26/13  Yes Reyne Dumas, MD  topiramate (TOPAMAX) 50 MG tablet Take 1 tablet (50 mg total) by mouth 2 (two) times daily. 12/26/13  Yes Reyne Dumas, MD  traMADol Veatrice Bourbon) 50  MG tablet Take 1 tablet (50 mg total) by mouth every 8 (eight) hours as needed for moderate pain. 03/06/14  Yes Deepak Advani, MD   BP 145/88  Pulse 125  Temp(Src) 98.5 F (36.9 C) (Oral)  Resp 18  SpO2 97%  LMP 03/06/2014 Physical Exam  Nursing note and vitals reviewed. Constitutional: She is oriented to person, place, and time. She appears well-developed and well-nourished.  HENT:  Head: Normocephalic and atraumatic.  Eyes: Pupils are equal, round, and reactive to light.  Neck: Normal range of motion.  Pulmonary/Chest: Effort normal.  Musculoskeletal:  Generalized right shoulder tenderness to palp No obvious deformity Limited ROM due to pain.  Neurological: She is alert and oriented to person, place, and time.    ED Course   Procedures (including critical care time) DIAGNOSTIC STUDIES: Oxygen Saturation is 97% on RA, no by my interpretation.    COORDINATION OF CARE:   9:43 AM-Discussed treatment plan which includes seeing a specialist and pain medication with pt at bedside and pt agreed to plan.   Labs Review Labs Reviewed - No data to display  Imaging Review Dg Shoulder Right  03/18/2014   CLINICAL DATA:  Right shoulder pain  EXAM: RIGHT SHOULDER - 2+ VIEW  COMPARISON:  None.  FINDINGS: There is no evidence of fracture or dislocation. There is no evidence of arthropathy or other focal bone abnormality. Soft tissues are unremarkable.  IMPRESSION: Negative.   Electronically Signed   By: Kathreen Devoid   On: 03/18/2014 09:57     EKG Interpretation None      MDM   Final diagnoses:  Right shoulder pain   I personally performed the services described in this documentation, which was scribed in my presence. The recorded information has been reviewed and is accurate.   10:08 AM Patient's shoulder xray unremarkable for acute changes. Patient will have vicodin as needed for pain. Patient advised to follow up with Dr. Ninfa Linden for further evaluation.     Alvina Chou, PA-C 03/18/14 1015

## 2014-03-19 ENCOUNTER — Other Ambulatory Visit: Payer: Self-pay | Admitting: Internal Medicine

## 2014-03-20 NOTE — Telephone Encounter (Signed)
Patient requesting refill of Tramadol 50mg . Discussed with Dr. Annitta Needs who did not authorize refill. He indicates patient must discuss with Orthopedics during her appointment. Patient has Ortho appointment on 03/24/14 at 1030. Placed call to patient to discuss; no answer. Left voicemail requesting return call.

## 2014-06-08 ENCOUNTER — Encounter: Payer: Self-pay | Admitting: Internal Medicine

## 2014-06-08 ENCOUNTER — Ambulatory Visit: Payer: Medicaid Other | Attending: Internal Medicine | Admitting: Internal Medicine

## 2014-06-08 VITALS — BP 122/83 | HR 79 | Temp 98.3°F | Resp 16 | Wt 253.2 lb

## 2014-06-08 DIAGNOSIS — R519 Headache, unspecified: Secondary | ICD-10-CM | POA: Insufficient documentation

## 2014-06-08 DIAGNOSIS — N920 Excessive and frequent menstruation with regular cycle: Secondary | ICD-10-CM | POA: Diagnosis not present

## 2014-06-08 DIAGNOSIS — D509 Iron deficiency anemia, unspecified: Secondary | ICD-10-CM

## 2014-06-08 DIAGNOSIS — M25512 Pain in left shoulder: Secondary | ICD-10-CM

## 2014-06-08 DIAGNOSIS — N921 Excessive and frequent menstruation with irregular cycle: Secondary | ICD-10-CM | POA: Insufficient documentation

## 2014-06-08 DIAGNOSIS — Z139 Encounter for screening, unspecified: Secondary | ICD-10-CM

## 2014-06-08 DIAGNOSIS — M25519 Pain in unspecified shoulder: Secondary | ICD-10-CM | POA: Diagnosis not present

## 2014-06-08 DIAGNOSIS — R51 Headache: Secondary | ICD-10-CM

## 2014-06-08 DIAGNOSIS — K219 Gastro-esophageal reflux disease without esophagitis: Secondary | ICD-10-CM

## 2014-06-08 MED ORDER — PANTOPRAZOLE SODIUM 40 MG PO TBEC: 40.0000 mg | DELAYED_RELEASE_TABLET | Freq: Every day | ORAL | Status: DC

## 2014-06-08 MED ORDER — TOPIRAMATE 50 MG PO TABS: 50.0000 mg | ORAL_TABLET | Freq: Two times a day (BID) | ORAL | Status: DC

## 2014-06-08 MED ORDER — FERROUS SULFATE 325 (65 FE) MG PO TABS: 325.0000 mg | ORAL_TABLET | Freq: Two times a day (BID) | ORAL | Status: DC

## 2014-06-08 MED ORDER — SUMATRIPTAN SUCCINATE 50 MG PO TABS
50.0000 mg | ORAL_TABLET | ORAL | Status: DC | PRN
Start: 2014-06-08 — End: 2017-01-27

## 2014-06-08 NOTE — Progress Notes (Signed)
Patient here for follow up on her left shoulder pain Today complains of pain to her right shoulder Denies any injury

## 2014-06-08 NOTE — Progress Notes (Signed)
MRN: [id number] Name: Christina Mcbride  Sex: female Age: 43 y.o. DOB: 12-14-1970  Allergies: Bee venom  Chief Complaint  Patient presents with  . Follow-up    HPI: Patient is 43 y.o. female who has is she of chronic headache, iron deficiency anemia, chronic left shoulder pain comes today for followup as per patient she ran out of her medications for the last one month and is requesting refill on those, denies any new symptoms also history of right shoulder pain had an x-ray done which was negative, patient has already been referred to orthopedics. Patient never had a mammogram done and is also due for Pap smear.  Past Medical History  Diagnosis Date  . Migraine   . Anemia   . PTSD (post-traumatic stress disorder)     reports daughter was murdered     History reviewed. No pertinent past surgical history.    Medication List       This list is accurate as of: 06/08/14  9:32 AM.  Always use your most recent med list.               acetaminophen 325 MG tablet  Commonly known as:  TYLENOL  Take 2 tablets (650 mg total) by mouth every 6 (six) hours as needed for mild pain (or Fever >/= 101).     cefUROXime 500 MG tablet  Commonly known as:  CEFTIN  Take 1 tablet (500 mg total) by mouth 2 (two) times daily with a meal.     cyclobenzaprine 5 MG tablet  Commonly known as:  FLEXERIL  Take 1 tablet (5 mg total) by mouth 3 (three) times daily as needed for muscle spasms.     EPINEPHrine 0.3 mg/0.3 mL Soaj injection  Commonly known as:  EPIPEN 2-PAK  Inject 0.3 mLs (0.3 mg total) into the muscle once as needed (for severe allergic reaction). CAll 911 immediately if you have to use this medicine     ferrous sulfate 325 (65 FE) MG tablet  Take 1 tablet (325 mg total) by mouth 2 (two) times daily with a meal.     HYDROcodone-acetaminophen 5-325 MG per tablet  Commonly known as:  NORCO/VICODIN  Take 1-2 tablets by mouth every 4 (four) hours as needed.     pantoprazole 40 MG  tablet  Commonly known as:  PROTONIX  Take 1 tablet (40 mg total) by mouth daily.     SUMAtriptan 50 MG tablet  Commonly known as:  IMITREX  Take 1 tablet (50 mg total) by mouth every 2 (two) hours as needed for migraine. Do not exceed 200mg  in 24 hours (4 pills)     topiramate 50 MG tablet  Commonly known as:  TOPAMAX  Take 1 tablet (50 mg total) by mouth 2 (two) times daily.     traMADol 50 MG tablet  Commonly known as:  ULTRAM  Take 1 tablet (50 mg total) by mouth every 8 (eight) hours as needed for moderate pain.        Meds ordered this encounter  Medications  . pantoprazole (PROTONIX) 40 MG tablet    Sig: Take 1 tablet (40 mg total) by mouth daily.    Dispense:  30 tablet    Refill:  3  . SUMAtriptan (IMITREX) 50 MG tablet    Sig: Take 1 tablet (50 mg total) by mouth every 2 (two) hours as needed for migraine. Do not exceed 200mg  in 24 hours (4 pills)    Dispense:  60  tablet    Refill:  2  . topiramate (TOPAMAX) 50 MG tablet    Sig: Take 1 tablet (50 mg total) by mouth 2 (two) times daily.    Dispense:  60 tablet    Refill:  2  . ferrous sulfate 325 (65 FE) MG tablet    Sig: Take 1 tablet (325 mg total) by mouth 2 (two) times daily with a meal.    Dispense:  60 tablet    Refill:  2    Immunization History  Administered Date(s) Administered  . Influenza,trivalent, recombinat, inj, PF 12/26/2013    Family History  Problem Relation Age of Onset  . Hypertension Mother   . Arthritis Mother   . Hypertension Son   . Cancer Maternal Aunt   . Diabetes Maternal Grandmother     History  Substance Use Topics  . Smoking status: Never Smoker   . Smokeless tobacco: Never Used  . Alcohol Use: No    Review of Systems   As noted in HPI  Filed Vitals:   06/08/14 0906  BP: 122/83  Pulse: 79  Temp: 98.3 F (36.8 C)  Resp: 16    Physical Exam  Physical Exam  Constitutional: No distress.  Eyes: EOM are normal. Pupils are equal, round, and reactive to  light.  Cardiovascular: Normal rate and regular rhythm.   Pulmonary/Chest: Breath sounds normal. No respiratory distress. She has no wheezes. She has no rales.  Musculoskeletal:  Left shoulder tenderness limited ROM, limited abduction, hand grip Ok 2+ radial pulse    CBC    Component Value Date/Time   WBC 8.0 01/06/2014 1049   RBC 4.55 01/06/2014 1049   RBC 3.06* 12/27/2013 0359   HGB 9.2* 01/06/2014 1049   HCT 30.1* 01/06/2014 1049   PLT 483* 01/06/2014 1049   MCV 66.2* 01/06/2014 1049   LYMPHSABS 1.3 01/06/2014 1049   MONOABS 0.7 01/06/2014 1049   EOSABS 0.2 01/06/2014 1049   BASOSABS 0.0 01/06/2014 1049    CMP     Component Value Date/Time   NA 139 12/28/2013 0751   K 3.8 12/28/2013 0751   CL 105 12/28/2013 0751   CO2 22 12/28/2013 0751   GLUCOSE 81 12/28/2013 0751   BUN 8 12/28/2013 0751   CREATININE 0.80 12/28/2013 0751   CREATININE 0.71 12/26/2013 1050   CALCIUM 8.2* 12/28/2013 0751   PROT 7.2 12/28/2013 0751   ALBUMIN 2.8* 12/28/2013 0751   AST 9 12/28/2013 0751   ALT 6 12/28/2013 0751   ALKPHOS 70 12/28/2013 0751   BILITOT 0.2* 12/28/2013 0751   GFRNONAA 89* 12/28/2013 0751   GFRNONAA >89 12/26/2013 1050   GFRAA >90 12/28/2013 0751   GFRAA >89 12/26/2013 1050    Lab Results  Component Value Date/Time   CHOL 181 12/26/2013 10:50 AM    No components found with this basename: hga1c    Lab Results  Component Value Date/Time   AST 9 12/28/2013  7:51 AM    Assessment and Plan  Anemia, iron deficiency - Plan: Resume back on ferrous sulfate 325 (65 FE) MG tablet, will recheck CBC on the next visit.  Chronic daily headache - Plan: SUMAtriptan (IMITREX) 50 MG tablet, topiramate (TOPAMAX) 50 MG tablet  Gastroesophageal reflux disease without esophagitis - Plan: pantoprazole (PROTONIX) 40 MG tablet  Menorrhagia with irregular cycle - Plan: Ambulatory referral to Gynecology  Left shoulder pain Patient to follow with orthopedics.  Screening - Plan: MM DIGITAL SCREENING BILATERAL   Health  Maintenance  -  Pap Smear: referred to GYN -Mammogram: ordered    Return in about 3 months (around 09/08/2014) for anemia.  Lorayne Marek, MD

## 2014-07-09 ENCOUNTER — Encounter: Payer: Self-pay | Admitting: Physician Assistant

## 2014-08-13 ENCOUNTER — Encounter: Payer: Medicaid Other | Admitting: Physician Assistant

## 2015-02-18 ENCOUNTER — Encounter (HOSPITAL_COMMUNITY): Payer: Self-pay | Admitting: Emergency Medicine

## 2015-02-18 ENCOUNTER — Emergency Department (HOSPITAL_COMMUNITY)
Admission: EM | Admit: 2015-02-18 | Discharge: 2015-02-18 | Disposition: A | Discharge status: Home / Self Care | Payer: Medicaid Other | Attending: Emergency Medicine | Admitting: Emergency Medicine

## 2015-02-18 DIAGNOSIS — G43809 Other migraine, not intractable, without status migrainosus: Secondary | ICD-10-CM | POA: Insufficient documentation

## 2015-02-18 DIAGNOSIS — R51 Headache: Secondary | ICD-10-CM | POA: Diagnosis present

## 2015-02-18 DIAGNOSIS — Z862 Personal history of diseases of the blood and blood-forming organs and certain disorders involving the immune mechanism: Secondary | ICD-10-CM | POA: Diagnosis not present

## 2015-02-18 DIAGNOSIS — F4310 Post-traumatic stress disorder, unspecified: Secondary | ICD-10-CM | POA: Insufficient documentation

## 2015-02-18 DIAGNOSIS — Z79899 Other long term (current) drug therapy: Secondary | ICD-10-CM | POA: Insufficient documentation

## 2015-02-18 LAB — CBC
HEMATOCRIT: 28.7 % — AB (ref 36.0–46.0)
HEMOGLOBIN: 8 g/dL — AB (ref 12.0–15.0)
MCH: 19 pg — ABNORMAL LOW (ref 26.0–34.0)
MCHC: 27.9 g/dL — ABNORMAL LOW (ref 30.0–36.0)
MCV: 68.3 fL — ABNORMAL LOW (ref 78.0–100.0)
Platelets: 581 10*3/uL — ABNORMAL HIGH (ref 150–400)
RBC: 4.2 MIL/uL (ref 3.87–5.11)
RDW: 18.7 % — AB (ref 11.5–15.5)
WBC: 6.6 10*3/uL (ref 4.0–10.5)

## 2015-02-18 LAB — BASIC METABOLIC PANEL
Anion gap: 4 — ABNORMAL LOW (ref 5–15)
BUN: 6 mg/dL (ref 6–23)
CO2: 26 mmol/L (ref 19–32)
Calcium: 8.6 mg/dL (ref 8.4–10.5)
Chloride: 110 mmol/L (ref 96–112)
Creatinine, Ser: 0.78 mg/dL (ref 0.50–1.10)
GFR calc Af Amer: >90 mL/min (ref >90)
Glucose, Bld: 147 mg/dL — ABNORMAL HIGH (ref 70–99)
Potassium: 3.7 mmol/L (ref 3.5–5.1)
SODIUM: 140 mmol/L (ref 135–145)

## 2015-02-18 MED ORDER — DIPHENHYDRAMINE HCL 50 MG/ML IJ SOLN
25.0000 mg | Freq: Once | INTRAMUSCULAR | Status: AC
  Administered 2015-02-18: 25 mg via INTRAVENOUS
  Filled 2015-02-18: qty 1

## 2015-02-18 MED ORDER — SODIUM CHLORIDE 0.9 % IV BOLUS (SEPSIS)
1000.0000 mL | Freq: Once | INTRAVENOUS | Status: AC
Start: 2015-02-18 — End: 2015-02-18
  Administered 2015-02-18: 1000 mL via INTRAVENOUS

## 2015-02-18 MED ORDER — KETOROLAC TROMETHAMINE 30 MG/ML IJ SOLN
30.0000 mg | Freq: Once | INTRAMUSCULAR | Status: AC
  Administered 2015-02-18: 30 mg via INTRAVENOUS
  Filled 2015-02-18: qty 1

## 2015-02-18 MED ORDER — METOCLOPRAMIDE HCL 5 MG/ML IJ SOLN
10.0000 mg | Freq: Once | INTRAMUSCULAR | Status: AC
  Administered 2015-02-18: 10 mg via INTRAVENOUS
  Filled 2015-02-18: qty 2

## 2015-02-18 NOTE — ED Notes (Signed)
C/o HA, dizziness and photophobia, sts feels like a typical migraine, A/O X4, NAD

## 2015-02-18 NOTE — Discharge Instructions (Signed)

## 2015-02-18 NOTE — ED Notes (Signed)
Called for pt x1 in lobby, no answer.

## 2015-02-18 NOTE — ED Provider Notes (Signed)
CSN: [id number]     Arrival date & time 02/18/15  1351 History   First MD Initiated Contact with Patient 02/18/15 1717     Chief Complaint  Patient presents with  . Headache     (Consider location/radiation/quality/duration/timing/severity/associated sxs/prior Treatment) HPI  44 year old female presents with a migraine since last night. She states it started gradually and has been waxing and waning in intensity since around 8 PM last night. Patient states she gets migraines around 3 times per week, this is typical for her migraine but more severe. She also has associated dizziness/room spinning that she states is also typical but more severe. She tried her Imitrex and Topamax at home with no relief. Patient denies a fevers, chills, neck stiffness, or new weakness or numbness. Patient states she's been having left leg weakness for about one year, it occasionally gives out on her. This is not new or worse now. She's never had this checked out before. Patient states she is also having photophobia but no blurred vision. Rates her pain as severe.  Past Medical History  Diagnosis Date  . Migraine   . Anemia   . PTSD (post-traumatic stress disorder)     reports daughter was murdered    Past Surgical History  Procedure Laterality Date  . Tubal ligation     Family History  Problem Relation Age of Onset  . Hypertension Mother   . Arthritis Mother   . Hypertension Son   . Cancer Maternal Aunt   . Diabetes Maternal Grandmother    History  Substance Use Topics  . Smoking status: Never Smoker   . Smokeless tobacco: Never Used  . Alcohol Use: No   OB History    No data available     Review of Systems  Constitutional: Negative for fever and chills.  Eyes: Positive for photophobia. Negative for visual disturbance.  Gastrointestinal: Positive for nausea. Negative for vomiting.  Musculoskeletal: Negative for neck pain and neck stiffness.  Neurological: Positive for dizziness, weakness  (chronic left leg weakness) and headaches. Negative for numbness.  All other systems reviewed and are negative.     Allergies  Bee venom  Home Medications   Prior to Admission medications   Medication Sig Start Date End Date Taking? Authorizing Provider  acetaminophen (TYLENOL) 325 MG tablet Take 2 tablets (650 mg total) by mouth every 6 (six) hours as needed for mild pain (or Fever >/= 101). 12/28/13   Delfina Redwood, MD  cefUROXime (CEFTIN) 500 MG tablet Take 1 tablet (500 mg total) by mouth 2 (two) times daily with a meal. 12/29/13   Delfina Redwood, MD  cyclobenzaprine (FLEXERIL) 5 MG tablet Take 1 tablet (5 mg total) by mouth 3 (three) times daily as needed for muscle spasms. 12/29/13   Delfina Redwood, MD  EPINEPHrine (EPIPEN 2-PAK) 0.3 mg/0.3 mL SOAJ injection Inject 0.3 mLs (0.3 mg total) into the muscle once as needed (for severe allergic reaction). CAll 911 immediately if you have to use this medicine 03/18/14   Alvina Chou, PA-C  ferrous sulfate 325 (65 FE) MG tablet Take 1 tablet (325 mg total) by mouth 2 (two) times daily with a meal. 06/08/14   Lorayne Marek, MD  HYDROcodone-acetaminophen (NORCO/VICODIN) 5-325 MG per tablet Take 1-2 tablets by mouth every 4 (four) hours as needed. 03/18/14   Kaitlyn Szekalski, PA-C  pantoprazole (PROTONIX) 40 MG tablet Take 1 tablet (40 mg total) by mouth daily. 06/08/14   Lorayne Marek, MD  SUMAtriptan (IMITREX) 50  MG tablet Take 1 tablet (50 mg total) by mouth every 2 (two) hours as needed for migraine. Do not exceed 200mg  in 24 hours (4 pills) 06/08/14   Lorayne Marek, MD  topiramate (TOPAMAX) 50 MG tablet Take 1 tablet (50 mg total) by mouth 2 (two) times daily. 06/08/14   Lorayne Marek, MD  traMADol (ULTRAM) 50 MG tablet Take 1 tablet (50 mg total) by mouth every 8 (eight) hours as needed for moderate pain. 03/06/14   Lorayne Marek, MD   BP 133/73 mmHg  Pulse 94  Temp(Src) 98.5 F (36.9 C) (Oral)  Resp 18  Ht 5\' 6"  (1.676 m)  SpO2  100% Physical Exam  Constitutional: She is oriented to person, place, and time. She appears well-developed and well-nourished.  HENT:  Head: Normocephalic and atraumatic.  Right Ear: External ear normal.  Left Ear: External ear normal.  Nose: Nose normal.  Eyes: EOM are normal. Pupils are equal, round, and reactive to light. Right eye exhibits no discharge. Left eye exhibits no discharge.  photophobic  Neck: Normal range of motion. Neck supple.  No meningismus  Cardiovascular: Normal rate, regular rhythm and normal heart sounds.   Pulmonary/Chest: Effort normal and breath sounds normal.  Abdominal: Soft. There is no tenderness.  Neurological: She is alert and oriented to person, place, and time.  CN 2-12 grossly intact. 5/5 strength in RUE, RLE, LUE. 5-/5 strength in LLE, no worse than typical per patient  Skin: Skin is warm and dry.  Nursing note and vitals reviewed.   ED Course  Procedures (including critical care time) Labs Review Labs Reviewed  CBC - Abnormal; Notable for the following:    Hemoglobin 8.0 (*)    HCT 28.7 (*)    MCV 68.3 (*)    MCH 19.0 (*)    MCHC 27.9 (*)    RDW 18.7 (*)    Platelets 581 (*)    All other components within normal limits  BASIC METABOLIC PANEL - Abnormal; Notable for the following:    Glucose, Bld 147 (*)    Anion gap 4 (*)    All other components within normal limits    Imaging Review No results found.   EKG Interpretation None      MDM   Final diagnoses:  Other migraine without status migrainosus, not intractable    Patient with a gradual onset headache that is typical for her migraines but more severe. Patient feels significant better after a headache cocktail. Normal neurologic exam. Given long-standing prior history of migraines and this not being different I have low suspicion for other emergent type of headache such as subarachnoid hemorrhage, meningitis, hematoma, or dissection. Stable for discharge to follow up with  her PCP.    Sherwood Gambler, MD 02/19/15 [phone]

## 2015-07-10 ENCOUNTER — Encounter (HOSPITAL_COMMUNITY): Payer: Self-pay | Admitting: Nurse Practitioner

## 2015-07-10 ENCOUNTER — Observation Stay (HOSPITAL_COMMUNITY)
Admission: EM | Admit: 2015-07-10 | Discharge: 2015-07-11 | Disposition: A | Discharge status: Home / Self Care | Payer: Medicaid Other | Attending: Internal Medicine | Admitting: Internal Medicine

## 2015-07-10 DIAGNOSIS — N926 Irregular menstruation, unspecified: Secondary | ICD-10-CM | POA: Insufficient documentation

## 2015-07-10 DIAGNOSIS — E559 Vitamin D deficiency, unspecified: Secondary | ICD-10-CM | POA: Insufficient documentation

## 2015-07-10 DIAGNOSIS — D500 Iron deficiency anemia secondary to blood loss (chronic): Principal | ICD-10-CM | POA: Insufficient documentation

## 2015-07-10 DIAGNOSIS — R000 Tachycardia, unspecified: Secondary | ICD-10-CM | POA: Insufficient documentation

## 2015-07-10 DIAGNOSIS — N920 Excessive and frequent menstruation with regular cycle: Secondary | ICD-10-CM | POA: Diagnosis not present

## 2015-07-10 DIAGNOSIS — N921 Excessive and frequent menstruation with irregular cycle: Secondary | ICD-10-CM | POA: Diagnosis present

## 2015-07-10 DIAGNOSIS — G43909 Migraine, unspecified, not intractable, without status migrainosus: Secondary | ICD-10-CM | POA: Diagnosis present

## 2015-07-10 DIAGNOSIS — Z91030 Bee allergy status: Secondary | ICD-10-CM | POA: Insufficient documentation

## 2015-07-10 DIAGNOSIS — D509 Iron deficiency anemia, unspecified: Secondary | ICD-10-CM

## 2015-07-10 DIAGNOSIS — F4310 Post-traumatic stress disorder, unspecified: Secondary | ICD-10-CM | POA: Diagnosis not present

## 2015-07-10 DIAGNOSIS — D649 Anemia, unspecified: Secondary | ICD-10-CM

## 2015-07-10 HISTORY — DX: Tachycardia, unspecified: R00.0

## 2015-07-10 HISTORY — DX: Disorder of thyroid, unspecified: E07.9

## 2015-07-10 HISTORY — DX: Vitamin D deficiency, unspecified: E55.9

## 2015-07-10 HISTORY — DX: Encounter for other specified aftercare: Z51.89

## 2015-07-10 LAB — CBC WITH DIFFERENTIAL/PLATELET
BASOS ABS: 0 10*3/uL (ref 0.0–0.1)
BASOS PCT: 0 % (ref 0–1)
Eosinophils Absolute: 0.2 10*3/uL (ref 0.0–0.7)
Eosinophils Relative: 3 % (ref 0–5)
HEMATOCRIT: 23.3 % — AB (ref 36.0–46.0)
Hemoglobin: 6.5 g/dL — CL (ref 12.0–15.0)
LYMPHS PCT: 28 % (ref 12–46)
Lymphs Abs: 1.7 10*3/uL (ref 0.7–4.0)
MCH: 17.9 pg — ABNORMAL LOW (ref 26.0–34.0)
MCHC: 27.9 g/dL — AB (ref 30.0–36.0)
MCV: 64.2 fL — ABNORMAL LOW (ref 78.0–100.0)
Monocytes Absolute: 0.4 10*3/uL (ref 0.1–1.0)
Monocytes Relative: 6 % (ref 3–12)
NEUTROS ABS: 3.8 10*3/uL (ref 1.7–7.7)
Neutrophils Relative %: 63 % (ref 43–77)
Platelets: 458 10*3/uL — ABNORMAL HIGH (ref 150–400)
RBC: 3.63 MIL/uL — ABNORMAL LOW (ref 3.87–5.11)
RDW: 19.7 % — ABNORMAL HIGH (ref 11.5–15.5)
WBC: 6.1 10*3/uL (ref 4.0–10.5)

## 2015-07-10 LAB — IRON AND TIBC
Iron: 15 ug/dL — ABNORMAL LOW (ref 28–170)
Saturation Ratios: 5 % — ABNORMAL LOW (ref 10.4–31.8)
TIBC: 329 ug/dL (ref 250–450)
UIBC: 314 ug/dL

## 2015-07-10 LAB — COMPREHENSIVE METABOLIC PANEL
ALT: 7 U/L — ABNORMAL LOW (ref 14–54)
ANION GAP: 9 (ref 5–15)
AST: 15 U/L (ref 15–41)
Albumin: 2.8 g/dL — ABNORMAL LOW (ref 3.5–5.0)
Alkaline Phosphatase: 68 U/L (ref 38–126)
BUN: 6 mg/dL (ref 6–20)
CO2: 26 mmol/L (ref 22–32)
Calcium: 7.9 mg/dL — ABNORMAL LOW (ref 8.9–10.3)
Chloride: 106 mmol/L (ref 101–111)
Creatinine, Ser: 0.73 mg/dL (ref 0.44–1.00)
GFR calc Af Amer: >60 mL/min (ref >60)
Glucose, Bld: 85 mg/dL (ref 65–99)
Potassium: 3.7 mmol/L (ref 3.5–5.1)
SODIUM: 141 mmol/L (ref 135–145)
Total Bilirubin: <0.2 mg/dL — ABNORMAL LOW (ref 0.3–1.2)
Total Protein: 6.7 g/dL (ref 6.5–8.1)

## 2015-07-10 LAB — URINE MICROSCOPIC-ADD ON

## 2015-07-10 LAB — URINALYSIS, ROUTINE W REFLEX MICROSCOPIC
Bilirubin Urine: NEGATIVE
Glucose, UA: NEGATIVE mg/dL
KETONES UR: NEGATIVE mg/dL
LEUKOCYTES UA: NEGATIVE
Nitrite: NEGATIVE
PH: 6 (ref 5.0–8.0)
Protein, ur: NEGATIVE mg/dL
Specific Gravity, Urine: 1.023 (ref 1.005–1.030)
UROBILINOGEN UA: 0.2 mg/dL (ref 0.0–1.0)

## 2015-07-10 LAB — RETICULOCYTES
RBC.: 3.65 MIL/uL — ABNORMAL LOW (ref 3.87–5.11)
RETIC CT PCT: 1.1 % (ref 0.4–3.1)
Retic Count, Absolute: 40.2 10*3/uL (ref 19.0–186.0)

## 2015-07-10 LAB — VITAMIN B12: VITAMIN B 12: 278 pg/mL (ref 180–914)

## 2015-07-10 LAB — FERRITIN: Ferritin: 6 ng/mL — ABNORMAL LOW (ref 11–307)

## 2015-07-10 LAB — PREPARE RBC (CROSSMATCH)

## 2015-07-10 LAB — TROPONIN I

## 2015-07-10 LAB — POC URINE PREG, ED: Preg Test, Ur: NEGATIVE

## 2015-07-10 LAB — FOLATE: FOLATE: 17.5 ng/mL (ref >5.9)

## 2015-07-10 MED ORDER — SUMATRIPTAN SUCCINATE 50 MG PO TABS
50.0000 mg | ORAL_TABLET | ORAL | Status: DC | PRN
  Filled 2015-07-10: qty 1

## 2015-07-10 MED ORDER — MEGESTROL ACETATE 40 MG PO TABS
80.0000 mg | ORAL_TABLET | Freq: Two times a day (BID) | ORAL | Status: DC
  Administered 2015-07-10 – 2015-07-11 (×2): 80 mg via ORAL
  Filled 2015-07-10 (×4): qty 2

## 2015-07-10 MED ORDER — SODIUM CHLORIDE 0.9 % IJ SOLN
3.0000 mL | Freq: Two times a day (BID) | INTRAMUSCULAR | Status: DC
  Administered 2015-07-10: 3 mL via INTRAVENOUS

## 2015-07-10 MED ORDER — TOPIRAMATE 25 MG PO TABS: 50.0000 mg | ORAL_TABLET | ORAL | Status: DC | PRN

## 2015-07-10 MED ORDER — FERROUS SULFATE 325 (65 FE) MG PO TABS
325.0000 mg | ORAL_TABLET | Freq: Three times a day (TID) | ORAL | Status: DC
  Administered 2015-07-10 – 2015-07-11 (×4): 325 mg via ORAL
  Filled 2015-07-10 (×4): qty 1

## 2015-07-10 NOTE — ED Notes (Signed)
Brought pt back to room via wheelchair; pt undressed, in gown, on monitor,continuous pulse oximetry and blood pressure cuff; warm blanket given

## 2015-07-10 NOTE — ED Notes (Signed)
Attempt to call report to floor x1. 

## 2015-07-10 NOTE — H&P (Signed)
Patient Demographics  Christina Mcbride, is a 44 y.o. female  MRN: [id number]   DOB - 19-Jun-1971  Admit Date - 07/10/2015  Outpatient Primary MD for the patient is Lorayne Marek, MD   With History of -  Past Medical History  Diagnosis Date  . Migraine   . Anemia   . PTSD (post-traumatic stress disorder)     reports daughter was murdered   . Vitamin D deficiency   . Thyroid disease   . Blood transfusion without reported diagnosis   . Tachycardia       Past Surgical History  Procedure Laterality Date  . Tubal ligation      in for   Chief Complaint  Patient presents with  . Dizziness     HPI  Christina Mcbride  is a 44 y.o. female, with past medical history significant for migraine headache, PTSD, anemia requiring blood transfusion in the past most likely related to menorrhagia(February 2015), patient presents with complaints of couple days of feeling weak, dizzy, lightheaded, feeling like passing out,, in ED workup was significant for hemoglobin of 6.5, baseline hemoglobin for his 8, patient reports she is on iron supplement for her menorrhagia, does not follow with GYN physician currently, denies any dark-colored stools, bright red blood per rectum, coffee-ground emesis, denies any NSAID use, denies any chest pain, shortness of breath, patient was ordered 2 units packed blood cell transfusion NAD, hospitalist requested to admit the patient, patient reports heavy periods, on average 7 days per week, 5-6 pads per day, her period finished last Saturday, has been feeling weak since.    Review of Systems    In addition to the HPI above, No Fever-chills, No Headache, No changes with Vision or hearing, No problems swallowing food or Liquids, No Chest pain, Cough or Shortness of Breath, No Abdominal pain, No Nausea or Vommitting, Bowel movements are regular, No Blood in stool or Urine, No dysuria, No new skin rashes or bruises, No new joints pains-aches,  Reports generalized  weakness, dizziness, fatigue, tingling, numbness in any extremity, No recent weight gain or loss, No polyuria, polydypsia or polyphagia, reports heavy periods No significant Mental Stressors.  A full 10 point Review of Systems was done, except as stated above, all other Review of Systems were negative.   Social History Social History  Substance Use Topics  . Smoking status: Never Smoker   . Smokeless tobacco: Never Used  . Alcohol Use: No     Family History Family History  Problem Relation Age of Onset  . Hypertension Mother   . Arthritis Mother   . Hypertension Son   . Cancer Maternal Aunt   . Diabetes Maternal Grandmother      Prior to Admission medications   Medication Sig Start Date End Date Taking? Authorizing Provider  ferrous sulfate 325 (65 FE) MG tablet Take 1 tablet (325 mg total) by mouth 2 (two) times daily with a meal. Patient taking differently: Take 325 mg by mouth 3 (three) times daily.  06/08/14  Yes Lorayne Marek, MD  SUMAtriptan (IMITREX) 50 MG tablet Take 1 tablet (50 mg total) by mouth every 2 (two) hours as needed for migraine. Do not exceed 200mg  in 24 hours (4 pills) 06/08/14  Yes Lorayne Marek, MD  topiramate (TOPAMAX) 50 MG tablet Take 1 tablet (50 mg total) by mouth 2 (two) times daily. Patient taking differently: Take 50 mg by mouth every 2 (two) hours as needed (migraines).  06/08/14  Yes Deepak Advani,  MD  EPINEPHrine (EPIPEN 2-PAK) 0.3 mg/0.3 mL SOAJ injection Inject 0.3 mLs (0.3 mg total) into the muscle once as needed (for severe allergic reaction). CAll 911 immediately if you have to use this medicine Patient not taking: Reported on 07/10/2015 03/18/14   Alvina Chou, PA-C    Allergies  Allergen Reactions  . Bee Venom Anaphylaxis    Physical Exam  Vitals  Blood pressure 143/76, pulse 64, temperature 97.8 F (36.6 C), temperature source Oral, resp. rate 14, height 5\' 6"  (1.676 m), weight 110.814 kg (244 lb 4.8 oz), last menstrual  period 06/26/2015, SpO2 100 %.   1. General well-nourished female lying in bed in NAD,    2. Normal affect and insight, Not Suicidal or Homicidal, Awake Alert, Oriented X 3.  3. No F.N deficits, ALL C.Nerves Intact, Strength 5/5 all 4 extremities, Sensation intact all 4 extremities, Plantars down going.  4. Ears and Eyes appear Normal, Conjunctivae clear, PERRLA. Moist Oral Mucosa.  5. Supple Neck, No JVD, No cervical lymphadenopathy appriciated, No Carotid Bruits.  6. Symmetrical Chest wall movement, Good air movement bilaterally, CTAB.  7. RRR, No Gallops, Rubs or Murmurs, No Parasternal Heave.  8. Positive Bowel Sounds, Abdomen Soft, No tenderness, No organomegaly appriciated,No rebound -guarding or rigidity.  9.  No Cyanosis, Normal Skin Turgor, No Skin Rash or Bruise.  10. Good muscle tone,  joints appear normal , no effusions, Normal ROM.  11. No Palpable Lymph Nodes in Neck or Axillae   Data Review  CBC  Recent Labs Lab 07/10/15 1128  WBC 6.1  HGB 6.5*  HCT 23.3*  PLT 458*  MCV 64.2*  MCH 17.9*  MCHC 27.9*  RDW 19.7*  LYMPHSABS 1.7  MONOABS 0.4  EOSABS 0.2  BASOSABS 0.0   ------------------------------------------------------------------------------------------------------------------  Chemistries   Recent Labs Lab 07/10/15 1128  NA 141  K 3.7  CL 106  CO2 26  GLUCOSE 85  BUN 6  CREATININE 0.73  CALCIUM 7.9*  AST 15  ALT 7*  ALKPHOS 68  BILITOT <0.2*   ------------------------------------------------------------------------------------------------------------------ estimated creatinine clearance is 112 mL/min (by C-G formula based on Cr of 0.73). ------------------------------------------------------------------------------------------------------------------ No results for input(s): TSH, T4TOTAL, T3FREE, THYROIDAB in the last 72 hours.  Invalid input(s): FREET3   Coagulation profile No results for input(s): INR, PROTIME in the last  168 hours. ------------------------------------------------------------------------------------------------------------------- No results for input(s): DDIMER in the last 72 hours. -------------------------------------------------------------------------------------------------------------------  Cardiac Enzymes  Recent Labs Lab 07/10/15 1128  TROPONINI <0.03   ------------------------------------------------------------------------------------------------------------------ Invalid input(s): POCBNP   ---------------------------------------------------------------------------------------------------------------  Urinalysis    Component Value Date/Time   COLORURINE YELLOW 07/10/2015 1115   APPEARANCEUR CLOUDY* 07/10/2015 1115   LABSPEC 1.023 07/10/2015 1115   PHURINE 6.0 07/10/2015 1115   GLUCOSEU NEGATIVE 07/10/2015 1115   HGBUR SMALL* 07/10/2015 1115   BILIRUBINUR NEGATIVE 07/10/2015 1115   KETONESUR NEGATIVE 07/10/2015 1115   PROTEINUR NEGATIVE 07/10/2015 1115   UROBILINOGEN 0.2 07/10/2015 1115   NITRITE NEGATIVE 07/10/2015 1115   LEUKOCYTESUR NEGATIVE 07/10/2015 1115    ----------------------------------------------------------------------------------------------------------------  Imaging results:   No results found.  My personal review of EKG: Rhythm NSR, Rate  72 /min, QTc 453 , no Acute ST changes    Assessment & Plan  Principal Problem:   Symptomatic anemia Active Problems:   Migraine   Menorrhagia with irregular cycle    Symptomatic anemia - Patient presents with hemoglobin of 6.5, baseline is 8, patient with known history of iron deficiency anemia secondary to menorrhagia with lengthy cycles. - Patient  will be admitted for PRBC transfusion, giving her symptoms ,already ordered 2 units, - We'll send anemia panel prior to transfusion, will continue with iron supplement.  Menorrhagia with irregular cycle - Discussed with faculty practice on-call.  Recommend to start her on Megace 80 mg oral twice a day, and to follow with them as an outpatient, she will be giving their faculty practice phone number to schedule an appointment.  Migraine - Opinion with home medication   DVT Prophylaxis SCDs, no chemical anticoagulation giving her symptomatic anemia AM Labs Ordered, also please review Full Orders  Family Communication: Admission, patients condition and plan of care including tests being ordered have been discussed with the patient  who indicate understanding and agree with the plan and Code Status.  Code Status Full  Likely DC to  home  Condition GUARDED    Time spent in minutes : 50 minutes    ELGERGAWY, DAWOOD M.D on 07/10/2015 at 2:24 PM  Between 7am to 7pm - Pager - [phone]  After 7pm go to [email or web address] - password [email or web address]  And look for the night coverage person covering me after hours  [facility] Office  [phone]

## 2015-07-10 NOTE — ED Provider Notes (Signed)
CSN: [id number]     Arrival date & time 07/10/15  1015 History   First MD Initiated Contact with Patient 07/10/15 1032     Chief Complaint  Patient presents with  . Dizziness     (Consider location/radiation/quality/duration/timing/severity/associated sxs/prior Treatment) HPI The patient ports that she stood up and stepped off of the bus about 2 days ago and she felt that the ground was coming up towards her. She had to sit down and let the symptoms passed. Since that time she has been experiencing episodes of either feeling off balance or getting the sensation that things were moving somewhat. This has been coming and going. She denies any associated headache. There have been no visual changes. She denies any focal weakness numbness or tingling of her extremities. The patient states that she has heavy menstrual cycles and in the past she has gotten very anemic. She was concerned maybe she had anemia again. He has not been sick with fevers, chills or general illness. She denies she's been experiencing any sinus symptoms of congestion pressure or drainage. Past Medical History  Diagnosis Date  . Migraine   . Anemia   . PTSD (post-traumatic stress disorder)     reports daughter was murdered   . Vitamin D deficiency   . Thyroid disease   . Blood transfusion without reported diagnosis   . Tachycardia    Past Surgical History  Procedure Laterality Date  . Tubal ligation     Family History  Problem Relation Age of Onset  . Hypertension Mother   . Arthritis Mother   . Hypertension Son   . Cancer Maternal Aunt   . Diabetes Maternal Grandmother    Social History  Substance Use Topics  . Smoking status: Never Smoker   . Smokeless tobacco: Never Used  . Alcohol Use: No   OB History    No data available     Review of Systems  10 Systems reviewed and are negative for acute change except as noted in the HPI.   Allergies  Bee venom  Home Medications   Prior to Admission  medications   Medication Sig Start Date End Date Taking? Authorizing Provider  ferrous sulfate 325 (65 FE) MG tablet Take 1 tablet (325 mg total) by mouth 2 (two) times daily with a meal. Patient taking differently: Take 325 mg by mouth 3 (three) times daily.  06/08/14  Yes Lorayne Marek, MD  SUMAtriptan (IMITREX) 50 MG tablet Take 1 tablet (50 mg total) by mouth every 2 (two) hours as needed for migraine. Do not exceed 200mg  in 24 hours (4 pills) 06/08/14  Yes Lorayne Marek, MD  topiramate (TOPAMAX) 50 MG tablet Take 1 tablet (50 mg total) by mouth 2 (two) times daily. Patient taking differently: Take 50 mg by mouth every 2 (two) hours as needed (migraines).  06/08/14  Yes Lorayne Marek, MD  EPINEPHrine (EPIPEN 2-PAK) 0.3 mg/0.3 mL SOAJ injection Inject 0.3 mLs (0.3 mg total) into the muscle once as needed (for severe allergic reaction). CAll 911 immediately if you have to use this medicine Patient not taking: Reported on 07/10/2015 03/18/14   Kaitlyn Szekalski, PA-C   BP 139/68 mmHg  Pulse 58  Temp(Src) 97.8 F (36.6 C) (Oral)  Resp 12  Ht 5\' 6"  (1.676 m)  Wt 244 lb 4.8 oz (110.814 kg)  BMI 39.45 kg/m2  SpO2 100%  LMP 06/26/2015 Physical Exam  Constitutional: She is oriented to person, place, and time. She appears well-developed and well-nourished.  HENT:  Head: Normocephalic and atraumatic.  Eyes: EOM are normal. Pupils are equal, round, and reactive to light.  Neck: Neck supple.  Cardiovascular: Normal rate, regular rhythm, normal heart sounds and intact distal pulses.   Pulmonary/Chest: Effort normal and breath sounds normal.  Abdominal: Soft. Bowel sounds are normal. She exhibits no distension. There is no tenderness.  Musculoskeletal: Normal range of motion. She exhibits no edema.  Neurological: She is alert and oriented to person, place, and time. She has normal strength. No cranial nerve deficit. She exhibits normal muscle tone. Coordination normal. GCS eye subscore is 4. GCS verbal  subscore is 5. GCS motor subscore is 6.  Normal finger-nose examination, no pronator drift. Sensation intact to light touch. Extremity strength 5\5 upper and lower joints.  Skin: Skin is warm, dry and intact.  Psychiatric: She has a normal mood and affect.    ED Course  Procedures (including critical care time) Labs Review Labs Reviewed  COMPREHENSIVE METABOLIC PANEL - Abnormal; Notable for the following:    Calcium 7.9 (*)    Albumin 2.8 (*)    ALT 7 (*)    Total Bilirubin <0.2 (*)    All other components within normal limits  CBC WITH DIFFERENTIAL/PLATELET - Abnormal; Notable for the following:    RBC 3.63 (*)    Hemoglobin 6.5 (*)    HCT 23.3 (*)    MCV 64.2 (*)    MCH 17.9 (*)    MCHC 27.9 (*)    RDW 19.7 (*)    Platelets 458 (*)    All other components within normal limits  URINALYSIS, ROUTINE W REFLEX MICROSCOPIC (NOT AT [facility]) - Abnormal; Notable for the following:    APPearance CLOUDY (*)    Hgb urine dipstick SMALL (*)    All other components within normal limits  URINE MICROSCOPIC-ADD ON - Abnormal; Notable for the following:    Squamous Epithelial / LPF MANY (*)    Bacteria, UA MANY (*)    All other components within normal limits  TROPONIN I  VITAMIN B12  FOLATE  IRON AND TIBC  FERRITIN  RETICULOCYTES  POC URINE PREG, ED  TYPE AND SCREEN  PREPARE RBC (CROSSMATCH)    Imaging Review No results found. I, Charlesetta Shanks, personally reviewed and evaluated these images and lab results as part of my medical decision-making.   EKG Interpretation   Date/Time:  Saturday July 10 2015 11:14:18 EDT Ventricular Rate:  72 PR Interval:  135 QRS Duration: 81 QT Interval:  414 QTC Calculation: 453 R Axis:   45 Text Interpretation:  Sinus rhythm no ischemic appearane. otherwise  normal. Confirmed by Johnney Killian, MD, Jeannie Done [phone]) on 07/10/2015 12:02:44 PM      MDM   Final diagnoses:  Iron deficiency anemia due to chronic blood loss  Symptomatic anemia    Patient has hemoglobin of 6.5. This is consistent with patient's feeling of dizziness and weakness. Patient has been symptomatic having to sit down in public places to avoid a syncopal episode. At this time she will be admitted for blood transfusion. The patient describes chronic blood loss due to heavy menstrual cycles but does not describe any GI bleeding or active blood loss.    Charlesetta Shanks, MD 07/10/15 [phone]

## 2015-07-10 NOTE — ED Notes (Signed)
Critical lab - hgb 6.5. EDP aware.

## 2015-07-10 NOTE — ED Notes (Signed)
Report given to RN on 5W. 

## 2015-07-10 NOTE — ED Notes (Signed)
She c/o several episodes of dizziness each day x several days. Dizziness worse when standing, relieved with sitting down. Denies loc. Denies pain. She is A&Ox4, resp e/u. She has a history of anemia and felt similar to this r/t her anemia in the past

## 2015-07-11 DIAGNOSIS — D649 Anemia, unspecified: Secondary | ICD-10-CM

## 2015-07-11 LAB — CBC
HCT: 28.3 % — ABNORMAL LOW (ref 36.0–46.0)
Hemoglobin: 8.5 g/dL — ABNORMAL LOW (ref 12.0–15.0)
MCH: 20.3 pg — AB (ref 26.0–34.0)
MCHC: 30 g/dL (ref 30.0–36.0)
MCV: 67.5 fL — AB (ref 78.0–100.0)
PLATELETS: 450 10*3/uL — AB (ref 150–400)
RBC: 4.19 MIL/uL (ref 3.87–5.11)
RDW: 21.8 % — ABNORMAL HIGH (ref 11.5–15.5)
WBC: 8.1 10*3/uL (ref 4.0–10.5)

## 2015-07-11 LAB — TYPE AND SCREEN
ABO/RH(D): B POS
ANTIBODY SCREEN: NEGATIVE
UNIT DIVISION: 0
Unit division: 0

## 2015-07-11 MED ORDER — FERROUS SULFATE 325 (65 FE) MG PO TABS: 325.0000 mg | ORAL_TABLET | Freq: Three times a day (TID) | ORAL | Status: DC

## 2015-07-11 MED ORDER — SODIUM CHLORIDE 0.9 % IV SOLN
25.0000 mg | Freq: Once | INTRAVENOUS | Status: AC
  Administered 2015-07-11: 25 mg via INTRAVENOUS
  Filled 2015-07-11: qty 0.5

## 2015-07-11 MED ORDER — SODIUM CHLORIDE 0.9 % IV SOLN
1000.0000 mg | Freq: Once | INTRAVENOUS | Status: AC
  Administered 2015-07-11: 1000 mg via INTRAVENOUS
  Filled 2015-07-11: qty 20

## 2015-07-11 NOTE — Discharge Instructions (Signed)
Follow with Primary MD Lorayne Marek, MD in 7 days   Get CBC, CMP, 2 view Chest X ray checked  by Primary MD next visit.    Activity: As tolerated with Full fall precautions use walker/cane & assistance as needed   Disposition Home    Diet: Heart Healthy    For Heart failure patients - Check your Weight same time everyday, if you gain over 2 pounds, or you develop in leg swelling, experience more shortness of breath or chest pain, call your Primary MD immediately. Follow Cardiac Low Salt Diet and 1.5 lit/day fluid restriction.   On your next visit with your primary care physician please Get Medicines reviewed and adjusted.   Please request your Prim.MD to go over all Hospital Tests and Procedure/Radiological results at the follow up, please get all Hospital records sent to your Prim MD by signing hospital release before you go home.   If you experience worsening of your admission symptoms, develop shortness of breath, life threatening emergency, suicidal or homicidal thoughts you must seek medical attention immediately by calling 911 or calling your MD immediately  if symptoms less severe.  You Must read complete instructions/literature along with all the possible adverse reactions/side effects for all the Medicines you take and that have been prescribed to you. Take any new Medicines after you have completely understood and accpet all the possible adverse reactions/side effects.   Do not drive, operating heavy machinery, perform activities at heights, swimming or participation in water activities or provide baby sitting services if your were admitted for syncope or siezures until you have seen by Primary MD or a Neurologist and advised to do so again.  Do not drive when taking Pain medications.    Do not take more than prescribed Pain, Sleep and Anxiety Medications  Special Instructions: If you have smoked or chewed Tobacco  in the last 2 yrs please stop smoking, stop any regular  Alcohol  and or any Recreational drug use.  Wear Seat belts while driving.   Please note  You were cared for by a hospitalist during your hospital stay. If you have any questions about your discharge medications or the care you received while you were in the hospital after you are discharged, you can call the unit and asked to speak with the hospitalist on call if the hospitalist that took care of you is not available. Once you are discharged, your primary care physician will handle any further medical issues. Please note that NO REFILLS for any discharge medications will be authorized once you are discharged, as it is imperative that you return to your primary care physician (or establish a relationship with a primary care physician if you do not have one) for your aftercare needs so that they can reassess your need for medications and monitor your lab values.

## 2015-07-11 NOTE — Progress Notes (Signed)
Patient discharge teaching given, including activity, diet, follow-up appoints, and medications. Patient verbalized understanding of all discharge instructions. IV access was d/c'd. Vitals are stable. Skin is intact except as charted in most recent assessments. Pt to be escorted out by NT, to be driven home by family. 

## 2015-07-11 NOTE — Discharge Summary (Signed)
Christina Mcbride, is a 44 y.o. female  DOB [birth year]  MRN [id number].  Admission date:  07/10/2015  Admitting Physician  Albertine Patricia, MD  Discharge Date:  07/11/2015   Primary MD  Lorayne Marek, MD  Recommendations for primary care physician for things to follow:   Monitor CBC and iron panel closely. Outpatient GYN follow-up for severe menorrhagia and blood loss   Admission Diagnosis  Iron deficiency anemia due to chronic blood loss [D50.0] Symptomatic anemia [D64.9]   Discharge Diagnosis  Iron deficiency anemia due to chronic blood loss [D50.0] Symptomatic anemia [D64.9]    Principal Problem:   Symptomatic anemia Active Problems:   Migraine   Menorrhagia with irregular cycle      Past Medical History  Diagnosis Date  . Migraine   . Anemia   . PTSD (post-traumatic stress disorder)     reports daughter was murdered   . Vitamin D deficiency   . Thyroid disease   . Blood transfusion without reported diagnosis   . Tachycardia     Past Surgical History  Procedure Laterality Date  . Tubal ligation         HPI  from the history and physical done on the day of admission:    Christina Mcbride is a 44 y.o. female, with past medical history significant for migraine headache, PTSD, anemia requiring blood transfusion in the past most likely related to menorrhagia(February 2015), patient presents with complaints of couple days of feeling weak, dizzy, lightheaded, feeling like passing out,, in ED workup was significant for hemoglobin of 6.5, baseline hemoglobin for his 8, patient reports she is on iron supplement for her menorrhagia, does not follow with GYN physician currently, denies any dark-colored stools, bright red blood per rectum, coffee-ground emesis, denies any NSAID use, denies any chest pain,  shortness of breath, patient was ordered 2 units packed blood cell transfusion NAD, hospitalist requested to admit the patient, patient reports heavy periods, on average 7 days per week, 5-6 pads per day, her period finished last Saturday, has been feeling weak since.     Hospital Course:    1. Symptomatically anemia due to iron deficiency anemia caused by heavy menorrhagia. Patient has had history of transfusions for the same problem in the past, hemoglobin was 6.5 with anemia panel suggestive of iron deficiency. She received 2 units of packed RBC transfusion, IV iron, oral iron supplementation will be increased from twice a day to 3 times a day. Will request close PCP follow-up for CBC and iron panel monitoring. Will request PCP to kindly refer the patient for outpatient GYN follow-up for ongoing heavy menorrhagia.   2. Migraines. No acute issues supportive care.   3. Menorrhagia. Outpatient GYN follow-up recommended.    Discharge Condition: Stable  Follow UP  Follow-up Information    Follow up with [facility]    . Schedule an appointment as soon as possible for a visit in 1 week.   Contact information:   [address]  [place] [address] [phone]      Follow up with [facility]. Schedule an appointment as soon as possible for a visit in 1 week.   Why:  Menorrhagia with anemia   Contact information:   [address]  [address] [place] [address] [phone]       Consults obtained - None  Diet and Activity recommendation: See Discharge Instructions below  Discharge Instructions       Discharge Instructions    Discharge instructions    Complete by:  As directed   Follow with Primary MD Lorayne Marek, MD in 7 days   Get CBC, CMP, 2 view Chest X ray checked  by Primary MD next visit.    Activity: As tolerated with Full fall precautions use walker/cane & assistance as  needed   Disposition Home    Diet: Heart Healthy    For Heart failure patients - Check your Weight same time everyday, if you gain over 2 pounds, or you develop in leg swelling, experience more shortness of breath or chest pain, call your Primary MD immediately. Follow Cardiac Low Salt Diet and 1.5 lit/day fluid restriction.   On your next visit with your primary care physician please Get Medicines reviewed and adjusted.   Please request your Prim.MD to go over all Hospital Tests and Procedure/Radiological results at the follow up, please get all Hospital records sent to your Prim MD by signing hospital release before you go home.   If you experience worsening of your admission symptoms, develop shortness of breath, life threatening emergency, suicidal or homicidal thoughts you must seek medical attention immediately by calling 911 or calling your MD immediately  if symptoms less severe.  You Must read complete instructions/literature along with all the possible adverse reactions/side effects for all the Medicines you take and that have been prescribed to you. Take any new Medicines after you have completely understood and accpet all the possible adverse reactions/side effects.   Do not drive, operating heavy machinery, perform activities at heights, swimming or participation in water activities or provide baby sitting services if your were admitted for syncope or siezures until you have seen by Primary MD or a Neurologist and advised to do so again.  Do not drive when taking Pain medications.    Do not take more than prescribed Pain, Sleep and Anxiety Medications  Special Instructions: If you have smoked or chewed Tobacco  in the last 2 yrs please stop smoking, stop any regular Alcohol  and or any Recreational drug use.  Wear Seat belts while driving.   Please note  You were cared for by a hospitalist during your hospital stay. If you have any questions about your discharge  medications or the care you received while you were in the hospital after you are discharged, you can call the unit and asked to speak with the hospitalist on call if the hospitalist that took care of you is not available. Once you are discharged, your primary care physician will handle any further medical issues. Please note that NO REFILLS for any discharge medications will be authorized once you are discharged, as it is imperative that you return to your primary care physician (or establish a relationship with a primary care physician if you do not have one) for your aftercare needs so that they can reassess your need for medications and monitor your lab values.     Increase activity slowly    Complete by:  As directed              Discharge Medications       Medication List    TAKE these medications        EPINEPHrine 0.3 mg/0.3 mL Soaj injection  Commonly known as:  EPIPEN 2-PAK  Inject 0.3 mLs (0.3 mg total) into the muscle once as needed (for severe allergic reaction). CAll 911 immediately if you have to use this medicine     ferrous sulfate 325 (65 FE) MG tablet  Take 1 tablet (325 mg total) by mouth 3 (three) times daily with meals.     SUMAtriptan 50 MG tablet  Commonly known as:  IMITREX  Take 1 tablet (50 mg total) by mouth every 2 (two) hours as needed for migraine. Do not exceed 200mg  in 24 hours (4 pills)     topiramate 50 MG tablet  Commonly known as:  TOPAMAX  Take 1 tablet (50 mg total) by mouth 2 (two) times daily.        Major procedures and Radiology Reports - PLEASE review detailed and final reports for all details, in brief -       No results found.  Micro Results      No results found for this or any previous visit (from the past 240 hour(s)).     Today   Subjective    Christina Mcbride today has no headache,no chest abdominal pain,no new weakness tingling or numbness, feels much better wants to go home today.     Objective   Blood  pressure 123/76, pulse 77, temperature 98.4 F (36.9 C), temperature source Oral, resp. rate 16, height 5\' 6"  (1.676 m), weight 110.814 kg (244 lb 4.8 oz), last menstrual period 06/26/2015, SpO2 100 %.   Intake/Output Summary (Last 24 hours) at 07/11/15 0936 Last data filed at 07/11/15 0236  Gross per 24 hour  Intake 1101.75 ml  Output   1000 ml  Net 101.75 ml    Exam Awake Alert, Oriented x 3, No new F.N deficits, Normal affect [place].AT,PERRAL Supple Neck,No JVD, No cervical lymphadenopathy appriciated.  Symmetrical Chest wall movement, Good air movement bilaterally, CTAB RRR,No Gallops,Rubs or new Murmurs, No Parasternal Heave +ve B.Sounds, Abd Soft, Non tender, No organomegaly appriciated, No rebound -guarding or rigidity. No Cyanosis, Clubbing or edema, No new Rash or bruise   Data Review   CBC w Diff: Lab Results  Component Value Date   WBC 8.1 07/11/2015   HGB 8.5* 07/11/2015   HCT 28.3* 07/11/2015   PLT 450* 07/11/2015   LYMPHOPCT 28 07/10/2015   MONOPCT 6 07/10/2015   EOSPCT 3 07/10/2015   BASOPCT 0 07/10/2015    CMP: Lab Results  Component Value Date   NA 141 07/10/2015   K 3.7 07/10/2015   CL 106 07/10/2015   CO2 26 07/10/2015   BUN 6 07/10/2015   CREATININE 0.73 07/10/2015   CREATININE 0.71 12/26/2013   PROT 6.7 07/10/2015   ALBUMIN 2.8* 07/10/2015   BILITOT <0.2* 07/10/2015   ALKPHOS 68 07/10/2015   AST 15 07/10/2015   ALT 7* 07/10/2015  .  Results for Christina Mcbride, Christina Mcbride (MRN [id number]) as of 07/11/2015 09:36  Ref. Range 07/10/2015 14:05  Iron Latest Ref Range: 28-170 ug/dL 15 (L)  UIBC Latest Units: ug/dL 314  TIBC Latest Ref Range: 250-450 ug/dL 329  Saturation Ratios Latest Ref Range: 10.4-31.8 % 5 (L)  Ferritin Latest Ref Range: 11-307 ng/mL 6 (L)  Folate Latest Ref Range: >5.9 ng/mL  17.5   Total Time in preparing paper work, data evaluation and todays exam - 35 minutes  Thurnell Lose M.D on 07/11/2015 at 9:36 AM  [facility]   Office   [phone]

## 2015-07-11 NOTE — Progress Notes (Signed)
MEDICATION RELATED CONSULT NOTE - INITIAL   Pharmacy Consult for IV Iron Indication: iron deficiency anemia  Allergies  Allergen Reactions  . Bee Venom Anaphylaxis    Patient Measurements: Height: 5\' 6"  (167.6 cm) Weight: 244 lb 4.8 oz (110.814 kg) IBW/kg (Calculated) : 59.3 Adjusted Body Weight:   Vital Signs: Temp: 98.4 F (36.9 C) (08/14 0643) Temp Source: Oral (08/14 0643) BP: 123/76 mmHg (08/14 0643) Pulse Rate: 77 (08/14 0643) Intake/Output from previous day: 08/13 0701 - 08/14 0700 In: 1101.8 [Blood:1101.8] Out: 1000 [Urine:1000] Intake/Output from this shift:    Labs:  Recent Labs  07/10/15 1128  WBC 6.1  HGB 6.5*  HCT 23.3*  PLT 458*  CREATININE 0.73  ALBUMIN 2.8*  PROT 6.7  AST 15  ALT 7*  ALKPHOS 68  BILITOT <0.2*   Estimated Creatinine Clearance: 112 mL/min (by C-G formula based on Cr of 0.73).  Medical History: Past Medical History  Diagnosis Date  . Migraine   . Anemia   . PTSD (post-traumatic stress disorder)     reports daughter was murdered   . Vitamin D deficiency   . Thyroid disease   . Blood transfusion without reported diagnosis   . Tachycardia     Medications:  Scheduled:  . ferrous sulfate  325 mg Oral TID  . iron dextran (INFED/DEXFERRUM) infusion  25 mg Intravenous Once   Followed by  . iron dextran (INFED/DEXFERRUM) infusion  1,000 mg Intravenous Once  . megestrol  80 mg Oral BID  . sodium chloride  3 mL Intravenous Q12H    Assessment: Hemoglobin 6.5, patient with history of iron deficiency anemia secondary to menorrhagia. Iron 15, Saturation ratio 5, TIBC 329.  Dose (mL) = 0.0442 (desired hemoglobin - observed hemoglobin) x LBW + (0.26 x LBW)   Goal of Therapy:  Baseline Hemoglobin ~8  Plan:  Iron dextran 25 mg for one test dose - observe for 1 hour Followed by iron dextran 1,000 mg infusion Monitor Hg, CBC, s/sx bleeding.

## 2016-03-01 ENCOUNTER — Encounter (HOSPITAL_BASED_OUTPATIENT_CLINIC_OR_DEPARTMENT_OTHER): Payer: Self-pay | Admitting: *Deleted

## 2016-03-01 ENCOUNTER — Emergency Department (HOSPITAL_BASED_OUTPATIENT_CLINIC_OR_DEPARTMENT_OTHER): Payer: Medicaid Other

## 2016-03-01 ENCOUNTER — Emergency Department (HOSPITAL_BASED_OUTPATIENT_CLINIC_OR_DEPARTMENT_OTHER)
Admission: EM | Admit: 2016-03-01 | Discharge: 2016-03-01 | Disposition: A | Discharge status: Home / Self Care | Payer: Medicaid Other | Attending: Emergency Medicine | Admitting: Emergency Medicine

## 2016-03-01 DIAGNOSIS — R109 Unspecified abdominal pain: Secondary | ICD-10-CM

## 2016-03-01 DIAGNOSIS — D649 Anemia, unspecified: Secondary | ICD-10-CM | POA: Insufficient documentation

## 2016-03-01 DIAGNOSIS — Z3202 Encounter for pregnancy test, result negative: Secondary | ICD-10-CM | POA: Insufficient documentation

## 2016-03-01 DIAGNOSIS — Z8639 Personal history of other endocrine, nutritional and metabolic disease: Secondary | ICD-10-CM | POA: Insufficient documentation

## 2016-03-01 DIAGNOSIS — G43909 Migraine, unspecified, not intractable, without status migrainosus: Secondary | ICD-10-CM | POA: Insufficient documentation

## 2016-03-01 DIAGNOSIS — K2970 Gastritis, unspecified, without bleeding: Secondary | ICD-10-CM | POA: Insufficient documentation

## 2016-03-01 DIAGNOSIS — Z79899 Other long term (current) drug therapy: Secondary | ICD-10-CM | POA: Insufficient documentation

## 2016-03-01 DIAGNOSIS — Z9851 Tubal ligation status: Secondary | ICD-10-CM | POA: Insufficient documentation

## 2016-03-01 DIAGNOSIS — Z8659 Personal history of other mental and behavioral disorders: Secondary | ICD-10-CM | POA: Insufficient documentation

## 2016-03-01 LAB — URINALYSIS, ROUTINE W REFLEX MICROSCOPIC
Bilirubin Urine: NEGATIVE
GLUCOSE, UA: NEGATIVE mg/dL
KETONES UR: NEGATIVE mg/dL
LEUKOCYTES UA: NEGATIVE
Nitrite: NEGATIVE
PH: 6.5 (ref 5.0–8.0)
Protein, ur: NEGATIVE mg/dL
Specific Gravity, Urine: 1.019 (ref 1.005–1.030)

## 2016-03-01 LAB — CBC WITH DIFFERENTIAL/PLATELET
Basophils Absolute: 0 10*3/uL (ref 0.0–0.1)
Basophils Relative: 0 %
EOS ABS: 0.2 10*3/uL (ref 0.0–0.7)
Eosinophils Relative: 4 %
HCT: 34.5 % — ABNORMAL LOW (ref 36.0–46.0)
HEMOGLOBIN: 10.8 g/dL — AB (ref 12.0–15.0)
LYMPHS ABS: 2.3 10*3/uL (ref 0.7–4.0)
Lymphocytes Relative: 33 %
MCH: 22.1 pg — AB (ref 26.0–34.0)
MCHC: 31.3 g/dL (ref 30.0–36.0)
MCV: 70.7 fL — ABNORMAL LOW (ref 78.0–100.0)
MONO ABS: 0.4 10*3/uL (ref 0.1–1.0)
MONOS PCT: 6 %
NEUTROS PCT: 57 %
Neutro Abs: 3.9 10*3/uL (ref 1.7–7.7)
Platelets: 522 10*3/uL — ABNORMAL HIGH (ref 150–400)
RBC: 4.88 MIL/uL (ref 3.87–5.11)
RDW: 17.6 % — ABNORMAL HIGH (ref 11.5–15.5)
WBC: 6.9 10*3/uL (ref 4.0–10.5)

## 2016-03-01 LAB — COMPREHENSIVE METABOLIC PANEL
ALK PHOS: 73 U/L (ref 38–126)
ALT: 9 U/L — ABNORMAL LOW (ref 14–54)
ANION GAP: 8 (ref 5–15)
AST: 16 U/L (ref 15–41)
Albumin: 3.7 g/dL (ref 3.5–5.0)
BILIRUBIN TOTAL: 0.4 mg/dL (ref 0.3–1.2)
BUN: 16 mg/dL (ref 6–20)
CALCIUM: 9 mg/dL (ref 8.9–10.3)
CO2: 25 mmol/L (ref 22–32)
Chloride: 105 mmol/L (ref 101–111)
Creatinine, Ser: 0.72 mg/dL (ref 0.44–1.00)
GFR calc non Af Amer: >60 mL/min (ref >60)
Glucose, Bld: 77 mg/dL (ref 65–99)
Potassium: 4.1 mmol/L (ref 3.5–5.1)
Sodium: 138 mmol/L (ref 135–145)
TOTAL PROTEIN: 8.1 g/dL (ref 6.5–8.1)

## 2016-03-01 LAB — URINE MICROSCOPIC-ADD ON

## 2016-03-01 LAB — LIPASE, BLOOD: LIPASE: 22 U/L (ref 11–51)

## 2016-03-01 LAB — PREGNANCY, URINE: Preg Test, Ur: NEGATIVE

## 2016-03-01 MED ORDER — OMEPRAZOLE 20 MG PO CPDR: 20.0000 mg | DELAYED_RELEASE_CAPSULE | Freq: Every day | ORAL | Status: DC

## 2016-03-01 MED ORDER — MORPHINE SULFATE (PF) 4 MG/ML IV SOLN
4.0000 mg | Freq: Once | INTRAVENOUS | Status: AC
  Administered 2016-03-01: 4 mg via INTRAVENOUS
  Filled 2016-03-01: qty 1

## 2016-03-01 MED ORDER — IOPAMIDOL (ISOVUE-300) INJECTION 61%
100.0000 mL | Freq: Once | INTRAVENOUS | Status: AC | PRN
  Administered 2016-03-01: 100 mL via INTRAVENOUS

## 2016-03-01 MED ORDER — ONDANSETRON HCL 4 MG/2ML IJ SOLN
4.0000 mg | Freq: Once | INTRAMUSCULAR | Status: AC
  Administered 2016-03-01: 4 mg via INTRAVENOUS
  Filled 2016-03-01: qty 2

## 2016-03-01 MED ORDER — GI COCKTAIL ~~LOC~~
30.0000 mL | Freq: Once | ORAL | Status: AC
  Administered 2016-03-01: 30 mL via ORAL
  Filled 2016-03-01: qty 30

## 2016-03-01 MED ORDER — SODIUM CHLORIDE 0.9 % IV BOLUS (SEPSIS)
1000.0000 mL | Freq: Once | INTRAVENOUS | Status: AC
  Administered 2016-03-01: 1000 mL via INTRAVENOUS

## 2016-03-01 NOTE — ED Notes (Signed)
Patient states she has had mid right abdominal pain for the last two days.  States she is having nausea and dizziness with her pain.  History of anemia.

## 2016-03-01 NOTE — Discharge Instructions (Signed)

## 2016-03-01 NOTE — ED Notes (Signed)
Patient transported to Ultrasound 

## 2016-03-01 NOTE — ED Notes (Signed)
MD at bedside. 

## 2016-03-01 NOTE — ED Provider Notes (Signed)
CSN: [id number]     Arrival date & time 03/01/16  1414 History   First MD Initiated Contact with Patient 03/01/16 1433     Chief Complaint  Patient presents with  . Abdominal Pain     HPI Patient presents to the emergency department with complaints of several days of dull waxing and waning upper abdominal pain.  She states she's been nauseated without vomiting.  She denies diarrhea.  No fevers or chills.  She's never had discomfort or pain like this before.  She does have a history of iron deficiency anemia and has required 2 blood transfusions.  Currently she is not complaining of any vaginal bleeding.  No lower abdominal pain.  Denies dysuria and urinary frequency.  She reports having some associated dizziness as well.  No shortness of breath.  No chest pain.  No productive cough.  Her symptoms are moderate in severity.  Nothing worsens or improves her pain.   Past Medical History  Diagnosis Date  . Migraine   . Anemia   . PTSD (post-traumatic stress disorder)     reports daughter was murdered   . Vitamin D deficiency   . Thyroid disease   . Blood transfusion without reported diagnosis   . Tachycardia    Past Surgical History  Procedure Laterality Date  . Tubal ligation     Family History  Problem Relation Age of Onset  . Hypertension Mother   . Arthritis Mother   . Hypertension Son   . Cancer Maternal Aunt   . Diabetes Maternal Grandmother    Social History  Substance Use Topics  . Smoking status: Never Smoker   . Smokeless tobacco: Never Used  . Alcohol Use: No   OB History    No data available     Review of Systems  All other systems reviewed and are negative.     Allergies  Bee venom  Home Medications   Prior to Admission medications   Medication Sig Start Date End Date Taking? Authorizing Provider  ferrous sulfate 325 (65 FE) MG tablet Take 1 tablet (325 mg total) by mouth 3 (three) times daily with meals. 07/11/15  Yes Thurnell Lose, MD  SUMAtriptan  (IMITREX) 50 MG tablet Take 1 tablet (50 mg total) by mouth every 2 (two) hours as needed for migraine. Do not exceed 200mg  in 24 hours (4 pills) 06/08/14  Yes Lorayne Marek, MD  topiramate (TOPAMAX) 50 MG tablet Take 1 tablet (50 mg total) by mouth 2 (two) times daily. Patient taking differently: Take 50 mg by mouth every 2 (two) hours as needed (migraines).  06/08/14  Yes Lorayne Marek, MD  EPINEPHrine (EPIPEN 2-PAK) 0.3 mg/0.3 mL SOAJ injection Inject 0.3 mLs (0.3 mg total) into the muscle once as needed (for severe allergic reaction). CAll 911 immediately if you have to use this medicine Patient not taking: Reported on 07/10/2015 03/18/14   Kaitlyn Szekalski, PA-C   BP 145/104 mmHg  Pulse 86  Temp(Src) 98 F (36.7 C) (Oral)  Resp 18  Ht 5\' 6"  (1.676 m)  Wt 250 lb (113.399 kg)  BMI 40.37 kg/m2  SpO2 99%  LMP 01/02/2016 (Exact Date) Physical Exam  Constitutional: She is oriented to person, place, and time. She appears well-developed and well-nourished. No distress.  HENT:  Head: Normocephalic and atraumatic.  Eyes: EOM are normal.  Neck: Normal range of motion.  Cardiovascular: Normal rate, regular rhythm and normal heart sounds.   Pulmonary/Chest: Effort normal and breath sounds normal.  Abdominal: Soft. She exhibits no distension.  Mild epigastric and right upper quadrant tenderness without guarding or rebound.  Musculoskeletal: Normal range of motion.  Neurological: She is alert and oriented to person, place, and time.  Skin: Skin is warm and dry.  Psychiatric: She has a normal mood and affect. Judgment normal.  Nursing note and vitals reviewed.   ED Course  Procedures (including critical care time) Labs Review Labs Reviewed  PREGNANCY, URINE  URINALYSIS, ROUTINE W REFLEX MICROSCOPIC (NOT AT [facility])  CBC WITH DIFFERENTIAL/PLATELET  COMPREHENSIVE METABOLIC PANEL  LIPASE, BLOOD   Results for orders placed or performed during the hospital encounter of 03/01/16  Urinalysis,  Routine w reflex microscopic (not at [facility])  Result Value Ref Range   Color, Urine YELLOW YELLOW   APPearance CLEAR CLEAR   Specific Gravity, Urine 1.019 1.005 - 1.030   pH 6.5 5.0 - 8.0   Glucose, UA NEGATIVE NEGATIVE mg/dL   Hgb urine dipstick SMALL (A) NEGATIVE   Bilirubin Urine NEGATIVE NEGATIVE   Ketones, ur NEGATIVE NEGATIVE mg/dL   Protein, ur NEGATIVE NEGATIVE mg/dL   Nitrite NEGATIVE NEGATIVE   Leukocytes, UA NEGATIVE NEGATIVE  Pregnancy, urine  Result Value Ref Range   Preg Test, Ur NEGATIVE NEGATIVE  CBC with Differential/Platelet  Result Value Ref Range   WBC 6.9 4.0 - 10.5 K/uL   RBC 4.88 3.87 - 5.11 MIL/uL   Hemoglobin 10.8 (L) 12.0 - 15.0 g/dL   HCT 34.5 (L) 36.0 - 46.0 %   MCV 70.7 (L) 78.0 - 100.0 fL   MCH 22.1 (L) 26.0 - 34.0 pg   MCHC 31.3 30.0 - 36.0 g/dL   RDW 17.6 (H) 11.5 - 15.5 %   Platelets 522 (H) 150 - 400 K/uL   Neutrophils Relative % 57 %   Neutro Abs 3.9 1.7 - 7.7 K/uL   Lymphocytes Relative 33 %   Lymphs Abs 2.3 0.7 - 4.0 K/uL   Monocytes Relative 6 %   Monocytes Absolute 0.4 0.1 - 1.0 K/uL   Eosinophils Relative 4 %   Eosinophils Absolute 0.2 0.0 - 0.7 K/uL   Basophils Relative 0 %   Basophils Absolute 0.0 0.0 - 0.1 K/uL  Comprehensive metabolic panel  Result Value Ref Range   Sodium 138 135 - 145 mmol/L   Potassium 4.1 3.5 - 5.1 mmol/L   Chloride 105 101 - 111 mmol/L   CO2 25 22 - 32 mmol/L   Glucose, Bld 77 65 - 99 mg/dL   BUN 16 6 - 20 mg/dL   Creatinine, Ser 0.72 0.44 - 1.00 mg/dL   Calcium 9.0 8.9 - 10.3 mg/dL   Total Protein 8.1 6.5 - 8.1 g/dL   Albumin 3.7 3.5 - 5.0 g/dL   AST 16 15 - 41 U/L   ALT 9 (L) 14 - 54 U/L   Alkaline Phosphatase 73 38 - 126 U/L   Total Bilirubin 0.4 0.3 - 1.2 mg/dL   GFR calc non Af Amer >60 >60 mL/min   GFR calc Af Amer >60 >60 mL/min   Anion gap 8 5 - 15  Lipase, blood  Result Value Ref Range   Lipase 22 11 - 51 U/L  Urine microscopic-add on  Result Value Ref Range   Squamous Epithelial /  LPF 6-30 (A) NONE SEEN   WBC, UA 0-5 0 - 5 WBC/hpf   RBC / HPF 0-5 0 - 5 RBC/hpf   Bacteria, UA MANY (A) NONE SEEN   Urine-Other MUCOUS PRESENT  Imaging Review No results found. I have personally reviewed and evaluated these images and lab results as part of my medical decision-making.   EKG Interpretation None      MDM   Final diagnoses:  None  1. Abdominal pain  Symptom control this time with morphine and nausea medicine.  IV fluids.  Patient will need CBC CMP lipase, urinalysis, urine pregnancy, right upper quadrant ultrasound.  Patient in shared visit with Dr. Venora Maples with RUQ and epigastric pain. Labs and [place] pending to evaluate gall bladder. Plan: will re-evaluate after pain medication, testing.  4:00: Re-evaluation: the patient's [place] is negative, labs are essentially unremarkable without leukocytosis, elevated lipase or LFT abnormality. She remains tender in the upper right and epigastric abdomen with referred tenderness from the RLQ. She denies vaginal discharge, pain, dysuria, urinary frequency. Will obtain CT scan given persistent and significant tenderness of abdomen. Patient care signed out to Dr. Alfonse Spruce pending CT results and re-evaluation.     Charlann Lange, PA-C 03/01/16 [place], PA-C 03/01/16 [place], MD 03/02/16 [phone]

## 2016-04-10 ENCOUNTER — Encounter (HOSPITAL_COMMUNITY): Payer: Self-pay | Admitting: Emergency Medicine

## 2016-04-10 ENCOUNTER — Emergency Department (HOSPITAL_COMMUNITY)
Admission: EM | Admit: 2016-04-10 | Discharge: 2016-04-11 | Disposition: A | Discharge status: Home / Self Care | Payer: Medicaid Other | Attending: Emergency Medicine | Admitting: Emergency Medicine

## 2016-04-10 DIAGNOSIS — N390 Urinary tract infection, site not specified: Secondary | ICD-10-CM | POA: Diagnosis not present

## 2016-04-10 DIAGNOSIS — R350 Frequency of micturition: Secondary | ICD-10-CM | POA: Diagnosis present

## 2016-04-10 DIAGNOSIS — Z3202 Encounter for pregnancy test, result negative: Secondary | ICD-10-CM | POA: Insufficient documentation

## 2016-04-10 DIAGNOSIS — Z8659 Personal history of other mental and behavioral disorders: Secondary | ICD-10-CM | POA: Insufficient documentation

## 2016-04-10 DIAGNOSIS — Z8639 Personal history of other endocrine, nutritional and metabolic disease: Secondary | ICD-10-CM | POA: Insufficient documentation

## 2016-04-10 DIAGNOSIS — G43909 Migraine, unspecified, not intractable, without status migrainosus: Secondary | ICD-10-CM | POA: Insufficient documentation

## 2016-04-10 DIAGNOSIS — D649 Anemia, unspecified: Secondary | ICD-10-CM | POA: Insufficient documentation

## 2016-04-10 DIAGNOSIS — Z79899 Other long term (current) drug therapy: Secondary | ICD-10-CM | POA: Diagnosis not present

## 2016-04-10 DIAGNOSIS — R000 Tachycardia, unspecified: Secondary | ICD-10-CM | POA: Insufficient documentation

## 2016-04-10 LAB — URINALYSIS, ROUTINE W REFLEX MICROSCOPIC
Bilirubin Urine: NEGATIVE
Glucose, UA: NEGATIVE mg/dL
Ketones, ur: NEGATIVE mg/dL
Nitrite: NEGATIVE
PROTEIN: 100 mg/dL — AB
Specific Gravity, Urine: 1.016 (ref 1.005–1.030)
pH: 5.5 (ref 5.0–8.0)

## 2016-04-10 LAB — URINE MICROSCOPIC-ADD ON

## 2016-04-10 LAB — COMPREHENSIVE METABOLIC PANEL
ALT: 8 U/L — AB (ref 14–54)
ANION GAP: 8 (ref 5–15)
AST: 12 U/L — ABNORMAL LOW (ref 15–41)
Albumin: 3.4 g/dL — ABNORMAL LOW (ref 3.5–5.0)
Alkaline Phosphatase: 76 U/L (ref 38–126)
BUN: 12 mg/dL (ref 6–20)
CALCIUM: 9 mg/dL (ref 8.9–10.3)
CHLORIDE: 103 mmol/L (ref 101–111)
CO2: 27 mmol/L (ref 22–32)
CREATININE: 0.96 mg/dL (ref 0.44–1.00)
Glucose, Bld: 149 mg/dL — ABNORMAL HIGH (ref 65–99)
Potassium: 4 mmol/L (ref 3.5–5.1)
Sodium: 138 mmol/L (ref 135–145)
Total Bilirubin: 0.4 mg/dL (ref 0.3–1.2)
Total Protein: 7.9 g/dL (ref 6.5–8.1)

## 2016-04-10 LAB — LIPASE, BLOOD: LIPASE: 26 U/L (ref 11–51)

## 2016-04-10 LAB — CBC
HCT: 35.3 % — ABNORMAL LOW (ref 36.0–46.0)
Hemoglobin: 10.5 g/dL — ABNORMAL LOW (ref 12.0–15.0)
MCH: 21 pg — ABNORMAL LOW (ref 26.0–34.0)
MCHC: 29.7 g/dL — ABNORMAL LOW (ref 30.0–36.0)
MCV: 70.7 fL — ABNORMAL LOW (ref 78.0–100.0)
PLATELETS: 495 10*3/uL — AB (ref 150–400)
RBC: 4.99 MIL/uL (ref 3.87–5.11)
RDW: 17.4 % — ABNORMAL HIGH (ref 11.5–15.5)
WBC: 11.5 10*3/uL — AB (ref 4.0–10.5)

## 2016-04-10 LAB — POC URINE PREG, ED: PREG TEST UR: NEGATIVE

## 2016-04-10 MED ORDER — CEPHALEXIN 250 MG PO CAPS
500.0000 mg | ORAL_CAPSULE | Freq: Once | ORAL | Status: AC
  Administered 2016-04-11: 500 mg via ORAL
  Filled 2016-04-10: qty 2

## 2016-04-10 MED ORDER — CEPHALEXIN 500 MG PO CAPS: 500.0000 mg | ORAL_CAPSULE | Freq: Two times a day (BID) | ORAL | Status: DC

## 2016-04-10 NOTE — ED Notes (Signed)
Pt. reports low abdominal pain with urinary frequency/blood tinged urine onset today , denies emesis or diarrhea . No fever or chills.

## 2016-04-10 NOTE — Discharge Instructions (Signed)
Urinary Tract Infection Ms. [name], take antibiotics for your urine infection.  See your primary care doctor within 3 days for close follow up.  If symptoms worsen, come back to the ED immediately. Thank you.   A urinary tract infection (UTI) can occur any place along the urinary tract. The tract includes the kidneys, ureters, bladder, and urethra. A type of germ called bacteria often causes a UTI. UTIs are often helped with antibiotic medicine.  HOME CARE   If given, take antibiotics as told by your doctor. Finish them even if you start to feel better.  Drink enough fluids to keep your pee (urine) clear or pale yellow.  Avoid tea, drinks with caffeine, and bubbly (carbonated) drinks.  Pee often. Avoid holding your pee in for a long time.  Pee before and after having sex (intercourse).  Wipe from front to back after you poop (bowel movement) if you are a woman. Use each tissue only once. GET HELP RIGHT AWAY IF:   You have back pain.  You have lower belly (abdominal) pain.  You have chills.  You feel sick to your stomach (nauseous).  You throw up (vomit).  Your burning or discomfort with peeing does not go away.  You have a fever.  Your symptoms are not better in 3 days. MAKE SURE YOU:   Understand these instructions.  Will watch your condition.  Will get help right away if you are not doing well or get worse.   This information is not intended to replace advice given to you by your health care provider. Make sure you discuss any questions you have with your health care provider.   Document Released: 05/01/2008 Document Revised: 12/04/2014 Document Reviewed: 06/13/2012 Elsevier Interactive Patient Education [employer].

## 2016-04-11 NOTE — ED Provider Notes (Signed)
CSN: [id number]     Arrival date & time 04/10/16  2006 History  By signing my name below, I, Christina Mcbride, attest that this documentation has been prepared under the direction and in the presence of [name], MD. Electronically Signed: Altamease Mcbride, ED Scribe. 04/11/2016. 12:42 AM   Chief Complaint  Patient presents with  . Abdominal Pain  . Urinary Frequency    The history is provided by the patient. No language interpreter was used.   Christina Mcbride is a 45 y.o. female who presents to the Emergency Department complaining of constant, 8/10 in severity,  pressure-like lower abdominal pain with onset today. Associated symptoms include increased frequency and hematuria. Pt denies irregular back pain, dysuria, and vaginal bleeding or discharge. She is eating and drinking normally.   Past Medical History  Diagnosis Date  . Migraine   . Anemia   . PTSD (post-traumatic stress disorder)     reports daughter was murdered   . Vitamin D deficiency   . Thyroid disease   . Blood transfusion without reported diagnosis   . Tachycardia    Past Surgical History  Procedure Laterality Date  . Tubal ligation     Family History  Problem Relation Age of Onset  . Hypertension Mother   . Arthritis Mother   . Hypertension Son   . Cancer Maternal Aunt   . Diabetes Maternal Grandmother    Social History  Substance Use Topics  . Smoking status: Never Smoker   . Smokeless tobacco: Never Used  . Alcohol Use: No   OB History    No data available     Review of Systems  Gastrointestinal: Positive for abdominal pain.  Genitourinary: Positive for frequency and hematuria. Negative for dysuria, flank pain, vaginal bleeding and vaginal discharge.  All other systems reviewed and are negative.  Allergies  Bee venom  Home Medications   Prior to Admission medications   Medication Sig Start Date End Date Taking? Authorizing Provider  cephALEXin (KEFLEX) 500 MG capsule Take 1 capsule (500 mg  total) by mouth 2 (two) times daily. 04/10/16   [name], MD  EPINEPHrine (EPIPEN 2-PAK) 0.3 mg/0.3 mL SOAJ injection Inject 0.3 mLs (0.3 mg total) into the muscle once as needed (for severe allergic reaction). CAll 911 immediately if you have to use this medicine Patient not taking: Reported on 07/10/2015 03/18/14   Alvina Chou, PA-C  ferrous sulfate 325 (65 FE) MG tablet Take 1 tablet (325 mg total) by mouth 3 (three) times daily with meals. 07/11/15   Thurnell Lose, MD  omeprazole (PRILOSEC) 20 MG capsule Take 1 capsule (20 mg total) by mouth daily. 03/01/16   Harvel Quale, MD  SUMAtriptan (IMITREX) 50 MG tablet Take 1 tablet (50 mg total) by mouth every 2 (two) hours as needed for migraine. Do not exceed 200mg  in 24 hours (4 pills) 06/08/14   Lorayne Marek, MD  topiramate (TOPAMAX) 50 MG tablet Take 1 tablet (50 mg total) by mouth 2 (two) times daily. Patient taking differently: Take 50 mg by mouth every 2 (two) hours as needed (migraines).  06/08/14   Lorayne Marek, MD   BP 146/88 mmHg  Pulse 108  Temp(Src) 98.8 F (37.1 C) (Oral)  Resp 16  Ht 5\' 6"  (1.676 m)  Wt 237 lb (107.502 kg)  BMI 38.27 kg/m2  SpO2 99%  LMP 03/27/2016 (Approximate) Physical Exam  Constitutional: She is oriented to person, place, and time. She appears well-developed and well-nourished. No distress.  HENT:  Head: Normocephalic and atraumatic.  Nose: Nose normal.  Mouth/Throat: Oropharynx is clear and moist. No oropharyngeal exudate.  Eyes: Conjunctivae and EOM are normal. Pupils are equal, round, and reactive to light. No scleral icterus.  Neck: Normal range of motion. Neck supple. No JVD present. No tracheal deviation present. No thyromegaly present.  Cardiovascular: Regular rhythm and normal heart sounds.  Tachycardia present.  Exam reveals no gallop and no friction rub.   No murmur heard. Pulmonary/Chest: Effort normal and breath sounds normal. No respiratory distress. She has no wheezes. She  exhibits no tenderness.  Abdominal: Soft. Bowel sounds are normal. She exhibits no distension and no mass. There is tenderness in the suprapubic area. There is no rebound and no guarding.  Musculoskeletal: Normal range of motion. She exhibits no edema or tenderness.  Lymphadenopathy:    She has no cervical adenopathy.  Neurological: She is alert and oriented to person, place, and time. No cranial nerve deficit. She exhibits normal muscle tone.  Skin: Skin is warm and dry. No rash noted. No erythema. No pallor.  Tactile fever  Nursing note and vitals reviewed.   ED Course  Procedures (including critical care time) DIAGNOSTIC STUDIES: Oxygen Saturation is 99% on RA,  normal by my interpretation.    COORDINATION OF CARE: 12:40 AM Discussed treatment plan which includes lab work and abx with pt at bedside and pt agreed to plan.  Labs Review Labs Reviewed  COMPREHENSIVE METABOLIC PANEL - Abnormal; Notable for the following:    Glucose, Bld 149 (*)    Albumin 3.4 (*)    AST 12 (*)    ALT 8 (*)    All other components within normal limits  CBC - Abnormal; Notable for the following:    WBC 11.5 (*)    Hemoglobin 10.5 (*)    HCT 35.3 (*)    MCV 70.7 (*)    MCH 21.0 (*)    MCHC 29.7 (*)    RDW 17.4 (*)    Platelets 495 (*)    All other components within normal limits  URINALYSIS, ROUTINE W REFLEX MICROSCOPIC (NOT AT [facility]) - Abnormal; Notable for the following:    APPearance TURBID (*)    Hgb urine dipstick LARGE (*)    Protein, ur 100 (*)    Leukocytes, UA MODERATE (*)    All other components within normal limits  URINE MICROSCOPIC-ADD ON - Abnormal; Notable for the following:    Squamous Epithelial / LPF 0-5 (*)    Bacteria, UA MANY (*)    All other components within normal limits  LIPASE, BLOOD  POC URINE PREG, ED    Imaging Review No results found. I have personally reviewed and evaluated these lab results as part of my medical decision-making.   EKG  Interpretation None      MDM   Final diagnoses:  UTI (lower urinary tract infection)    Patient presents to the ED for urinary symptoms consistent with UTI. UA confirms this.  She was given keflex for treatment.  Oral rehydration in the ED have fixed her tachycardia.  She appears well and in NAD.  Vs remain within her normal limits and she is safe for DC with PCP fu within 3 days.   I personally performed the services described in this documentation, which was scribed in my presence. The recorded information has been reviewed and is accurate.      [name], MD 04/11/16 [phone]

## 2016-04-11 NOTE — ED Notes (Signed)
Patient presents stating she has been having urinary frequency with lower abd pain for the past couple of days.  States she feels like she needs to go to the BR all the time and when she does go it is only a small amount.  Also c/o lower abd pain but denies vaginal discharge.  States she has noticed blood in her urine.

## 2016-04-11 NOTE — ED Notes (Signed)
Discharge instructions and prescription reviewed 

## 2016-05-07 ENCOUNTER — Encounter (HOSPITAL_COMMUNITY): Payer: Self-pay

## 2016-05-07 ENCOUNTER — Emergency Department (HOSPITAL_COMMUNITY): Payer: Medicaid Other

## 2016-05-07 ENCOUNTER — Emergency Department (HOSPITAL_COMMUNITY)
Admission: EM | Admit: 2016-05-07 | Discharge: 2016-05-07 | Disposition: A | Discharge status: Home / Self Care | Payer: Medicaid Other | Attending: Emergency Medicine | Admitting: Emergency Medicine

## 2016-05-07 DIAGNOSIS — Y929 Unspecified place or not applicable: Secondary | ICD-10-CM | POA: Diagnosis not present

## 2016-05-07 DIAGNOSIS — Y999 Unspecified external cause status: Secondary | ICD-10-CM | POA: Diagnosis not present

## 2016-05-07 DIAGNOSIS — Y939 Activity, unspecified: Secondary | ICD-10-CM | POA: Insufficient documentation

## 2016-05-07 DIAGNOSIS — S8391XA Sprain of unspecified site of right knee, initial encounter: Secondary | ICD-10-CM | POA: Insufficient documentation

## 2016-05-07 DIAGNOSIS — W109XXA Fall (on) (from) unspecified stairs and steps, initial encounter: Secondary | ICD-10-CM | POA: Insufficient documentation

## 2016-05-07 DIAGNOSIS — S63501A Unspecified sprain of right wrist, initial encounter: Secondary | ICD-10-CM | POA: Insufficient documentation

## 2016-05-07 DIAGNOSIS — S6991XA Unspecified injury of right wrist, hand and finger(s), initial encounter: Secondary | ICD-10-CM | POA: Diagnosis present

## 2016-05-07 DIAGNOSIS — W19XXXA Unspecified fall, initial encounter: Secondary | ICD-10-CM

## 2016-05-07 DIAGNOSIS — Y92009 Unspecified place in unspecified non-institutional (private) residence as the place of occurrence of the external cause: Secondary | ICD-10-CM

## 2016-05-07 NOTE — ED Notes (Signed)
According to EMS, pt was experienced a mechanical fall this AM, now c/o 8/10 right knee and 8/10 right wrist pain. Pt denies hitting her head. Pt denies loc. Pt denies n/v. Pt arrives A+OX4, speaking in complete sentences.

## 2016-05-07 NOTE — ED Provider Notes (Signed)
CSN: [id number]     Arrival date & time 05/07/16  1419 History   First MD Initiated Contact with Patient 05/07/16 1427     Chief Complaint  Patient presents with  . Fall  . Knee Pain  . Wrist Pain     (Consider location/radiation/quality/duration/timing/severity/associated sxs/prior Treatment) HPI   Blood pressure 117/62, pulse 71, temperature 98.3 F (36.8 C), resp. rate 22, last menstrual period 03/27/2016, SpO2 100 %.  Christina Mcbride is a 45 y.o. female complaining of Pain to right knee and right wrist status post mechanical fall down the anterior steps this a.m. There was no head trauma, loss of consciousness, cervicalgia, chest pain, shortness of breath, abdominal pain. Patient is ambulatory but with pain, states her pain is 8 out of 10 and exacerbated by movement and palpation, no pain medication taken prior to arrival.   Past Medical History  Diagnosis Date  . Migraine   . Anemia   . PTSD (post-traumatic stress disorder)     reports daughter was murdered   . Vitamin D deficiency   . Thyroid disease   . Blood transfusion without reported diagnosis   . Tachycardia    Past Surgical History  Procedure Laterality Date  . Tubal ligation     Family History  Problem Relation Age of Onset  . Hypertension Mother   . Arthritis Mother   . Hypertension Son   . Cancer Maternal Aunt   . Diabetes Maternal Grandmother    Social History  Substance Use Topics  . Smoking status: Never Smoker   . Smokeless tobacco: Never Used  . Alcohol Use: No   OB History    No data available     Review of Systems  10 systems reviewed and found to be negative, except as noted in the HPI.   Allergies  Bee venom  Home Medications   Prior to Admission medications   Medication Sig Start Date End Date Taking? Authorizing Provider  SUMAtriptan (IMITREX) 50 MG tablet Take 1 tablet (50 mg total) by mouth every 2 (two) hours as needed for migraine. Do not exceed 200mg  in 24 hours (4 pills)  06/08/14  Yes Lorayne Marek, MD  topiramate (TOPAMAX) 50 MG tablet Take 1 tablet (50 mg total) by mouth 2 (two) times daily. Patient taking differently: Take 50 mg by mouth every 2 (two) hours as needed (migraines).  06/08/14  Yes Lorayne Marek, MD  cephALEXin (KEFLEX) 500 MG capsule Take 1 capsule (500 mg total) by mouth 2 (two) times daily. Patient not taking: Reported on 05/07/2016 04/10/16   [name], MD  EPINEPHrine (EPIPEN 2-PAK) 0.3 mg/0.3 mL SOAJ injection Inject 0.3 mLs (0.3 mg total) into the muscle once as needed (for severe allergic reaction). CAll 911 immediately if you have to use this medicine Patient not taking: Reported on 07/10/2015 03/18/14   Alvina Chou, PA-C  ferrous sulfate 325 (65 FE) MG tablet Take 1 tablet (325 mg total) by mouth 3 (three) times daily with meals. Patient not taking: Reported on 05/07/2016 07/11/15   Thurnell Lose, MD  omeprazole (PRILOSEC) 20 MG capsule Take 1 capsule (20 mg total) by mouth daily. Patient not taking: Reported on 05/07/2016 03/01/16   Harvel Quale, MD   BP 117/62 mmHg  Pulse 71  Temp(Src) 98.3 F (36.8 C)  Resp 22  SpO2 100%  LMP 05/04/2016 Physical Exam  Constitutional: She is oriented to person, place, and time. She appears well-developed and well-nourished. No distress.  HENT:  Head:  Normocephalic and atraumatic.  Mouth/Throat: Oropharynx is clear and moist.  Eyes: Conjunctivae and EOM are normal. Pupils are equal, round, and reactive to light.  Neck: Normal range of motion.  Cardiovascular: Normal rate, regular rhythm and intact distal pulses.   Pulmonary/Chest: Effort normal and breath sounds normal. No stridor. No respiratory distress. She has no wheezes. She has no rales. She exhibits no tenderness.  Abdominal: Soft. There is no tenderness.  Musculoskeletal: Normal range of motion. She exhibits edema and tenderness.  Right wrist with no deformity, no focal snuffbox tenderness palpation, radial pulses 2+, distally  neurovascularly intact with 5 out of 5 strength 5 digits.  Right knee: No deformity, full active range of motion, stable to anterior and posterior drawer, no abnormal laxity on valgus varus stress.  Neurological: She is alert and oriented to person, place, and time.  Skin: She is not diaphoretic.  Psychiatric: She has a normal mood and affect.  Nursing note and vitals reviewed.   ED Course  Procedures (including critical care time) Labs Review Labs Reviewed - No data to display  Imaging Review Dg Wrist Complete Right  05/07/2016  CLINICAL DATA:  Fall today at home.  Right wrist pain. EXAM: RIGHT WRIST - COMPLETE 3+ VIEW COMPARISON:  None FINDINGS: There is no evidence of fracture or dislocation. There is no evidence of arthropathy or other focal bone abnormality. Soft tissues are unremarkable. Pronator fat pad normal. IMPRESSION: Negative. Electronically Signed   By: Van Clines M.D.   On: 05/07/2016 14:48   Dg Knee Complete 4 Views Right  05/07/2016  CLINICAL DATA:  [place] today at home, pain in right knee and wrist; pt states that she is not sure why and how she fell; no previous injuries EXAM: RIGHT KNEE - COMPLETE 4+ VIEW COMPARISON:  None. FINDINGS: Mild tricompartmental arthritis. No fracture or dislocation. No joint effusion. IMPRESSION: No acute findings. Electronically Signed   By: Skipper Cliche M.D.   On: 05/07/2016 14:56   I have personally reviewed and evaluated these images and lab results as part of my medical decision-making.   EKG Interpretation None      MDM   Final diagnoses:  Right knee sprain, initial encounter  Right wrist sprain, initial encounter  Fall at home, initial encounter    Filed Vitals:   05/07/16 1421 05/07/16 1426  BP:  117/62  Pulse:  71  Temp:  98.3 F (36.8 C)  Resp:  22  SpO2: 98% 100%    Christina Mcbride is 45 y.o. female presenting with Right wrist and knee pain status post mechanical fall down several stairs, there was no  head trauma, patient is neurovascularly intact with excellent range of motion to both joints. X-rays are negative, I doubt there is an occult fracture based on her lack of snuffbox tenderness palpation. Patient will be given crutches, orthopedic referral when necessary.  Evaluation does not show pathology that would require ongoing emergent intervention or inpatient treatment. Pt is hemodynamically stable and mentating appropriately. Discussed findings and plan with patient/guardian, who agrees with care plan. All questions answered. Return precautions discussed and outpatient follow up given.       Monico Blitz, PA-C 05/07/16 [place], DO 05/10/16 2258

## 2016-05-07 NOTE — Discharge Instructions (Signed)
Rest, Ice intermittently (in the first 24-48 hours), Gentle compression with an Ace wrap, and elevate (Limb above the level of the heart)   Take up to 800mg  of ibuprofen (that is usually 4 over the counter pills)  3 times a day for 5 days. Take with food.  Please follow with your primary care doctor in the next 2 days for a check-up. They must obtain records for further management.   Do not hesitate to return to the Emergency Department for any new, worsening or concerning symptoms.   Knee Sprain A knee sprain is a tear in the strong bands of tissue that connect the bones (ligaments) of your knee. HOME CARE  Raise (elevate) your injured knee to lessen puffiness (swelling).  To ease pain and puffiness, put ice on the injured area.  Put ice in a plastic bag.  Place a towel between your skin and the bag.  Leave the ice on for 20 minutes, 2-3 times a day.  Only take medicine as told by your doctor.  Do not leave your knee unprotected until pain and stiffness go away (usually 4-6 weeks).  If you have a cast or splint, do not get it wet. If your doctor told you to not take it off, cover it with a plastic bag when you shower or bathe. Do not swim.  Your doctor may have you do exercises to prevent or limit permanent weakness and stiffness. GET HELP RIGHT AWAY IF:   Your cast or splint becomes damaged.  Your pain gets worse.  You have a lot of pain, puffiness, or numbness below the cast or splint. MAKE SURE YOU:   Understand these instructions.  Will watch your condition.  Will get help right away if you are not doing well or get worse.   This information is not intended to replace advice given to you by your health care provider. Make sure you discuss any questions you have with your health care provider.   Document Released: 11/01/2009 Document Revised: 11/18/2013 Document Reviewed: 07/22/2013 Elsevier Interactive Patient Education [employer].

## 2016-05-07 NOTE — ED Notes (Signed)
Bed: [id number] Expected date:  Expected time:  Means of arrival:  Comments: EMS- fall down steps; no LOC

## 2017-01-27 ENCOUNTER — Inpatient Hospital Stay (HOSPITAL_COMMUNITY)
Admission: EM | Admit: 2017-01-27 | Discharge: 2017-01-29 | DRG: 812 | Disposition: A | Discharge status: Home / Self Care | Payer: Medicaid Other | Attending: Internal Medicine | Admitting: Internal Medicine

## 2017-01-27 ENCOUNTER — Encounter (HOSPITAL_COMMUNITY): Payer: Self-pay | Admitting: *Deleted

## 2017-01-27 DIAGNOSIS — Z91030 Bee allergy status: Secondary | ICD-10-CM

## 2017-01-27 DIAGNOSIS — R110 Nausea: Secondary | ICD-10-CM | POA: Diagnosis present

## 2017-01-27 DIAGNOSIS — Z79899 Other long term (current) drug therapy: Secondary | ICD-10-CM

## 2017-01-27 DIAGNOSIS — K219 Gastro-esophageal reflux disease without esophagitis: Secondary | ICD-10-CM | POA: Diagnosis present

## 2017-01-27 DIAGNOSIS — R519 Headache, unspecified: Secondary | ICD-10-CM | POA: Diagnosis present

## 2017-01-27 DIAGNOSIS — Z9119 Patient's noncompliance with other medical treatment and regimen: Secondary | ICD-10-CM

## 2017-01-27 DIAGNOSIS — D509 Iron deficiency anemia, unspecified: Principal | ICD-10-CM | POA: Diagnosis present

## 2017-01-27 DIAGNOSIS — E039 Hypothyroidism, unspecified: Secondary | ICD-10-CM | POA: Diagnosis present

## 2017-01-27 DIAGNOSIS — Z8249 Family history of ischemic heart disease and other diseases of the circulatory system: Secondary | ICD-10-CM

## 2017-01-27 DIAGNOSIS — F4310 Post-traumatic stress disorder, unspecified: Secondary | ICD-10-CM | POA: Diagnosis present

## 2017-01-27 DIAGNOSIS — E876 Hypokalemia: Secondary | ICD-10-CM | POA: Diagnosis present

## 2017-01-27 DIAGNOSIS — D649 Anemia, unspecified: Secondary | ICD-10-CM | POA: Diagnosis not present

## 2017-01-27 DIAGNOSIS — G43909 Migraine, unspecified, not intractable, without status migrainosus: Secondary | ICD-10-CM | POA: Diagnosis present

## 2017-01-27 DIAGNOSIS — R51 Headache: Secondary | ICD-10-CM

## 2017-01-27 LAB — TSH: TSH: 2.633 u[IU]/mL (ref 0.350–4.500)

## 2017-01-27 LAB — CBC WITH DIFFERENTIAL/PLATELET
Basophils Absolute: 0 10*3/uL (ref 0.0–0.1)
Basophils Relative: 0 %
EOS PCT: 3 %
Eosinophils Absolute: 0.3 10*3/uL (ref 0.0–0.7)
HEMATOCRIT: 21.2 % — AB (ref 36.0–46.0)
Hemoglobin: 5.4 g/dL — CL (ref 12.0–15.0)
LYMPHS ABS: 2.5 10*3/uL (ref 0.7–4.0)
Lymphocytes Relative: 28 %
MCH: 15.7 pg — ABNORMAL LOW (ref 26.0–34.0)
MCHC: 25.5 g/dL — AB (ref 30.0–36.0)
MCV: 61.4 fL — AB (ref 78.0–100.0)
MONO ABS: 0.4 10*3/uL (ref 0.1–1.0)
MONOS PCT: 5 %
NEUTROS ABS: 5.7 10*3/uL (ref 1.7–7.7)
Neutrophils Relative %: 64 %
PLATELETS: 658 10*3/uL — AB (ref 150–400)
RBC: 3.45 MIL/uL — ABNORMAL LOW (ref 3.87–5.11)
RDW: 19.7 % — AB (ref 11.5–15.5)
WBC: 8.9 10*3/uL (ref 4.0–10.5)

## 2017-01-27 LAB — IRON AND TIBC
Iron: 12 ug/dL — ABNORMAL LOW (ref 28–170)
Saturation Ratios: 3 % — ABNORMAL LOW (ref 10.4–31.8)
TIBC: 398 ug/dL (ref 250–450)
UIBC: 386 ug/dL

## 2017-01-27 LAB — BASIC METABOLIC PANEL
Anion gap: 10 (ref 5–15)
BUN: 11 mg/dL (ref 6–20)
CALCIUM: 9.1 mg/dL (ref 8.9–10.3)
CO2: 23 mmol/L (ref 22–32)
CREATININE: 0.77 mg/dL (ref 0.44–1.00)
Chloride: 105 mmol/L (ref 101–111)
GFR calc Af Amer: >60 mL/min (ref >60)
GFR calc non Af Amer: >60 mL/min (ref >60)
GLUCOSE: 84 mg/dL (ref 65–99)
Potassium: 3.9 mmol/L (ref 3.5–5.1)
Sodium: 138 mmol/L (ref 135–145)

## 2017-01-27 LAB — FERRITIN: FERRITIN: 8 ng/mL — AB (ref 11–307)

## 2017-01-27 LAB — I-STAT BETA HCG BLOOD, ED (MC, WL, AP ONLY): I-stat hCG, quantitative: <5 m[IU]/mL (ref <5)

## 2017-01-27 LAB — PREPARE RBC (CROSSMATCH)

## 2017-01-27 MED ORDER — DIPHENHYDRAMINE HCL 50 MG/ML IJ SOLN
25.0000 mg | Freq: Once | INTRAMUSCULAR | Status: AC
  Administered 2017-01-27: 25 mg via INTRAVENOUS
  Filled 2017-01-27: qty 1

## 2017-01-27 MED ORDER — PROCHLORPERAZINE EDISYLATE 5 MG/ML IJ SOLN
10.0000 mg | Freq: Once | INTRAMUSCULAR | Status: AC
  Administered 2017-01-27: 10 mg via INTRAVENOUS
  Filled 2017-01-27: qty 2

## 2017-01-27 MED ORDER — KETOROLAC TROMETHAMINE 30 MG/ML IJ SOLN
30.0000 mg | Freq: Once | INTRAMUSCULAR | Status: AC
  Administered 2017-01-27: 30 mg via INTRAVENOUS
  Filled 2017-01-27: qty 1

## 2017-01-27 MED ORDER — SODIUM CHLORIDE 0.9 % IV BOLUS (SEPSIS)
1000.0000 mL | Freq: Once | INTRAVENOUS | Status: AC
  Administered 2017-01-27: 1000 mL via INTRAVENOUS

## 2017-01-27 MED ORDER — KETOROLAC TROMETHAMINE 15 MG/ML IJ SOLN: 15.0000 mg | Freq: Four times a day (QID) | INTRAMUSCULAR | Status: DC | PRN

## 2017-01-27 MED ORDER — SUMATRIPTAN SUCCINATE 50 MG PO TABS
50.0000 mg | ORAL_TABLET | ORAL | Status: DC | PRN
  Filled 2017-01-27: qty 1

## 2017-01-27 MED ORDER — SODIUM CHLORIDE 0.9 % IV SOLN: Freq: Once | INTRAVENOUS | Status: DC

## 2017-01-27 MED ORDER — SODIUM CHLORIDE 0.9 % IV SOLN
Freq: Once | INTRAVENOUS | Status: AC
  Administered 2017-01-27: 19:00:00 via INTRAVENOUS

## 2017-01-27 MED ORDER — IBUPROFEN 200 MG PO TABS: 400.0000 mg | ORAL_TABLET | Freq: Four times a day (QID) | ORAL | Status: DC | PRN

## 2017-01-27 MED ORDER — FERROUS SULFATE 325 (65 FE) MG PO TABS: 325.0000 mg | ORAL_TABLET | Freq: Every day | ORAL | Status: DC

## 2017-01-27 NOTE — ED Triage Notes (Signed)
Pt reports having a migraine x 1 week. Having dizziness, sensitivity to light and sound.  denies n/v. Hx of migraines.

## 2017-01-27 NOTE — H&P (Signed)
History and Physical    Christina Mcbride [id number] DOB: Jan 11, 1971 DOA: 01/27/2017  PCP: Lorayne Marek, MD  Patient coming from: Home  Chief Complaint: Headache  HPI: Christina Mcbride is a 46 y.o. female with medical history significant of migraine and iron deficiency anemia comes to the ED with complaints of severe headache. Patient states she has a history of severe migraine for which she used to take Topamax 50 mg orally twice a day and sumatriptan as needed but she ran out of it 2 months ago. Since then she has been having about 2-4 episodes of severe headache lasting for several hours. While she was in her medication she states she was getting maybe 1-2 episodes but there were no where as severe. Today she describes her headache as her typical migraine and is mostly located in front for head and it's aggravated by bright lights and loud sounds. During this past 2 months she had been taking Excedrin Migraine medication but no relief this time therefore came to the ED. On her routine labs she was noted to have hemoglobin of 5.4 and that further questioning she reports of increase in fatigue and shortness of breath with minimal exertion. She denies any blood in her stool but she does report of heavy periods at times. She was supposed to be on iron supplements as she had been previously diagnosed iron deficiency anemia but she also ran out of that 2 months ago. She used to get her medications from community health physicians but within the last due to come issues with her son, she has put herself on a "back burner". No other complaints.   ED Course: In the ED for her headache she received IVF, Ketorolac IV and Compazine. On her labs she was noted to have Plt 658 and Hb 5.4.  Review of Systems: As per HPI otherwise 10 point review of systems negative.    Past Medical History:  Diagnosis Date  . Anemia   . Blood transfusion without reported diagnosis   . Migraine   . PTSD (post-traumatic stress  disorder)    reports daughter was murdered   . Tachycardia   . Thyroid disease   . Vitamin D deficiency     Past Surgical History:  Procedure Laterality Date  . TUBAL LIGATION       reports that she has never smoked. She has never used smokeless tobacco. She reports that she does not drink alcohol or use drugs.  Allergies  Allergen Reactions  . Bee Venom Anaphylaxis    Family History  Problem Relation Age of Onset  . Hypertension Mother   . Arthritis Mother   . Hypertension Son   . Cancer Maternal Aunt   . Diabetes Maternal Grandmother      Prior to Admission medications   Medication Sig Start Date End Date Taking? Authorizing Provider  ferrous sulfate 325 (65 FE) MG tablet Take 325 mg by mouth daily with breakfast.   Yes Historical Provider, MD  SUMAtriptan (IMITREX) 50 MG tablet Take 50 mg by mouth every 2 (two) hours as needed for migraine. May repeat in 2 hours if headache persists or recurs.   Yes Historical Provider, MD  topiramate (TOPAMAX) 50 MG tablet Take 50 mg by mouth 2 (two) times daily.   Yes Historical Provider, MD    Physical Exam: Vitals:   01/27/17 1401 01/27/17 1611  BP: 150/86 129/78  Pulse: 78 70  Resp: 16 17  Temp: 98 F (36.7 C)   TempSrc: Oral  SpO2: 100% 100%  Weight: 108.9 kg (240 lb)   Height: 5' 6.5" (1.689 m)       Constitutional: Slight distress due to acute migraine episode at this time. Vitals:   01/27/17 1401 01/27/17 1611  BP: 150/86 129/78  Pulse: 78 70  Resp: 16 17  Temp: 98 F (36.7 C)   TempSrc: Oral   SpO2: 100% 100%  Weight: 108.9 kg (240 lb)   Height: 5' 6.5" (1.689 m)    Eyes: PERRL, lids and conjunctivae normal ENMT: Mucous membranes are moist. Posterior pharynx clear of any exudate or lesions.Normal dentition.  Neck: normal, supple, no masses, no thyromegaly Respiratory: clear to auscultation bilaterally, no wheezing, no crackles. Normal respiratory effort. No accessory muscle use.  Cardiovascular:  Regular rate and rhythm, no murmurs / rubs / gallops. No extremity edema. 2+ pedal pulses. No carotid bruits.  Abdomen: no tenderness, no masses palpated. No hepatosplenomegaly. Bowel sounds positive.  Musculoskeletal: no clubbing / cyanosis. No joint deformity upper and lower extremities. Good ROM, no contractures. Normal muscle tone.  Skin: no rashes, lesions, ulcers. No induration Neurologic: CN 2-12 grossly intact. Sensation intact, DTR normal. Strength 5/5 in all 4.  Psychiatric: Normal judgment and insight. Alert and oriented x 3. Normal mood.   Labs on Admission: I have personally reviewed following labs and imaging studies  CBC:  Recent Labs Lab 01/27/17 1738  WBC 8.9  NEUTROABS 5.7  HGB 5.4*  HCT 21.2*  MCV 61.4*  PLT [id number]*   Basic Metabolic Panel:  Recent Labs Lab 01/27/17 1738  NA 138  K 3.9  CL 105  CO2 23  GLUCOSE 84  BUN 11  CREATININE 0.77  CALCIUM 9.1   GFR: Estimated Creatinine Clearance: 110.8 mL/min (by C-G formula based on SCr of 0.77 mg/dL). Liver Function Tests: No results for input(s): AST, ALT, ALKPHOS, BILITOT, PROT, ALBUMIN in the last 168 hours. No results for input(s): LIPASE, AMYLASE in the last 168 hours. No results for input(s): AMMONIA in the last 168 hours. Coagulation Profile: No results for input(s): INR, PROTIME in the last 168 hours. Cardiac Enzymes: No results for input(s): CKTOTAL, CKMB, CKMBINDEX, TROPONINI in the last 168 hours. BNP (last 3 results) No results for input(s): PROBNP in the last 8760 hours. HbA1C: No results for input(s): HGBA1C in the last 72 hours. CBG: No results for input(s): GLUCAP in the last 168 hours. Lipid Profile: No results for input(s): CHOL, HDL, LDLCALC, TRIG, CHOLHDL, LDLDIRECT in the last 72 hours. Thyroid Function Tests: No results for input(s): TSH, T4TOTAL, FREET4, T3FREE, THYROIDAB in the last 72 hours. Anemia Panel: No results for input(s): VITAMINB12, FOLATE, FERRITIN, TIBC, IRON,  RETICCTPCT in the last 72 hours. Urine analysis:    Component Value Date/Time   COLORURINE YELLOW 04/10/2016 2128   APPEARANCEUR TURBID (A) 04/10/2016 2128   LABSPEC 1.016 04/10/2016 2128   PHURINE 5.5 04/10/2016 2128   GLUCOSEU NEGATIVE 04/10/2016 2128   HGBUR LARGE (A) 04/10/2016 2128   BILIRUBINUR NEGATIVE 04/10/2016 2128   KETONESUR NEGATIVE 04/10/2016 2128   PROTEINUR 100 (A) 04/10/2016 2128   UROBILINOGEN 0.2 07/10/2015 1115   NITRITE NEGATIVE 04/10/2016 2128   LEUKOCYTESUR MODERATE (A) 04/10/2016 2128   Sepsis Labs: !!!!!!!!!!!!!!!!!!!!!!!!!!!!!!!!!!!!!!!!!!!! @LABRCNTIP (procalcitonin:4,lacticidven:4) )No results found for this or any previous visit (from the past 240 hour(s)).   Radiological Exams on Admission: No results found.  EKG: Independently reviewed.   Assessment/Plan Principal Problem:   Symptomatic anemia Active Problems:   PTSD (post-traumatic stress disorder)   Migraine  Iron deficiency anemia   Chronic daily headache   Symptomatic anemia -Admit for observation at this time -Likely iron deficiency anemia, unknown source at this time but there is a history of menorrhagia. -She is medically noncompliant as she ran out of her medications 2 months ago. -We will give her 2 units of PRBC at this time. Check iron studies. -Repeat hemoglobin tomorrow and follow-up. Occult blood ordered.  Acute migraine episode -She was given Toradol in the ED, her headache is improved a little bit at this time -We'll give her NSAIDs as needed for the acute episode. She can also get sumatriptan as needed every 2 hours for now. -Consider starting her on Topamax 25 mg orally for prophylaxis upon discharge. She was supposed to be on Topamax 50 mg orally twice a day which she has not been taking.  Thrombocytosis -Likely reactive. We'll continue to monitor.  Hypothyroidism -Not in any medications? Will check her TSH  History of PTSD-stable, no further intervention needed at  this time.    DVT prophylaxis: Early Ambulation Code Status:Full Family Communication: None needed patient comprehends well Disposition Plan: Likely discharge tomorrow if her hemoglobin remained stable and her headache is improved. Consults called: None Admission status: Observation   Marquist Binstock Arsenio Loader MD [facility]   If 7PM-7AM, please contact night-coverage [email or web address] Password [facility]  01/27/2017, 7:37 PM

## 2017-01-27 NOTE — ED Provider Notes (Signed)
[place] DEPT Provider Note   CSN: [id number] Arrival date & time: 01/27/17  1356     History   Chief Complaint Chief Complaint  Patient presents with  . Migraine    HPI Christina Mcbride is a 46 y.o. female.  HPI   Patient is a 46 year old female with history of migraines, iron deficiency anemia and PTSD who presents to the ED with complaint of migraine, onset one week. Patient reports she has had a constant gradually worsening frontal migraine which she describes as constant throbbing pressure pain across her forehead. She notes her headache is worse with bright lights or loud sounds. Endorses associated intermittent blurred vision dizziness and lightheadedness. Patient reports her headache is consistent with her typical migraines but notes they're usually relieved with taking Topamax and Imitrex which she is currently out of. She notes she typically has a migraine every 2-3 days each week. She notes she has been taking Excedrin at home without relief. Denies any recent fall or head injury. Pt denies fever, neck stiffness, abdominal pain, N/V, urinary symptoms, numbness, tingling, weakness, seizures, syncope.    Past Medical History:  Diagnosis Date  . Anemia   . Blood transfusion without reported diagnosis   . Migraine   . PTSD (post-traumatic stress disorder)    reports daughter was murdered   . Tachycardia   . Thyroid disease   . Vitamin D deficiency     Patient Active Problem List   Diagnosis Date Noted  . Chronic daily headache 06/08/2014  . Gastroesophageal reflux disease without esophagitis 06/08/2014  . Menorrhagia with irregular cycle 06/08/2014  . Anemia, iron deficiency 03/06/2014  . Left shoulder pain 03/06/2014  . Anemia 12/27/2013  . Symptomatic anemia 12/27/2013  . Tachycardia 12/27/2013  . Bilateral leg edema 12/27/2013  . Goiter 12/27/2013  . Dehydration 12/27/2013  . Dizziness 12/27/2013  . Generalized weakness 12/27/2013  . Iron deficiency anemia  12/27/2013  . Low iron stores 12/27/2013  . Trichomoniasis 12/27/2013  . UTI (urinary tract infection) 12/27/2013  . PTSD (post-traumatic stress disorder)   . Migraine     Past Surgical History:  Procedure Laterality Date  . TUBAL LIGATION      OB History    No data available       Home Medications    Prior to Admission medications   Not on File    Family History Family History  Problem Relation Age of Onset  . Hypertension Mother   . Arthritis Mother   . Hypertension Son   . Cancer Maternal Aunt   . Diabetes Maternal Grandmother     Social History Social History  Substance Use Topics  . Smoking status: Never Smoker  . Smokeless tobacco: Never Used  . Alcohol use No     Allergies   Bee venom   Review of Systems Review of Systems  Eyes: Positive for photophobia and visual disturbance (blurred]).  Neurological: Positive for dizziness and headaches.  All other systems reviewed and are negative.    Physical Exam Updated Vital Signs BP 129/78 (BP Location: Right Arm)   Pulse 70   Temp 98 F (36.7 C) (Oral)   Resp 17   Ht 5' 6.5" (1.689 m)   Wt 108.9 kg   SpO2 100%   BMI 38.16 kg/m   Physical Exam  Constitutional: She is oriented to person, place, and time. She appears well-developed and well-nourished. No distress.  HENT:  Head: Normocephalic and atraumatic.  Right Ear: Tympanic membrane normal.  Left  Ear: Tympanic membrane normal.  Nose: Nose normal. Right sinus exhibits no maxillary sinus tenderness and no frontal sinus tenderness. Left sinus exhibits no maxillary sinus tenderness and no frontal sinus tenderness.  Mouth/Throat: Uvula is midline, oropharynx is clear and moist and mucous membranes are normal. No oropharyngeal exudate, posterior oropharyngeal edema, posterior oropharyngeal erythema or tonsillar abscesses. No tonsillar exudate.  Eyes: Conjunctivae and EOM are normal. Pupils are equal, round, and reactive to light. Right eye  exhibits no discharge. Left eye exhibits no discharge. No scleral icterus.  No nystagmus  Neck: Normal range of motion. Neck supple.  Cardiovascular: Normal rate, regular rhythm, normal heart sounds and intact distal pulses.   Pulmonary/Chest: Effort normal and breath sounds normal. No respiratory distress. She has no wheezes. She has no rales. She exhibits no tenderness.  Abdominal: Soft. Bowel sounds are normal. She exhibits no distension and no mass. There is no tenderness. There is no rebound and no guarding. No hernia.  Musculoskeletal: Normal range of motion. She exhibits no edema.  Lymphadenopathy:    She has no cervical adenopathy.  Neurological: She is alert and oriented to person, place, and time. She has normal strength. No cranial nerve deficit or sensory deficit. Coordination normal.  Skin: Skin is warm and dry. She is not diaphoretic.  Nursing note and vitals reviewed.    ED Treatments / Results  Labs (all labs ordered are listed, but only abnormal results are displayed) Labs Reviewed  CBC WITH DIFFERENTIAL/PLATELET - Abnormal; Notable for the following:       Result Value   RBC 3.45 (*)    Hemoglobin 5.4 (*)    HCT 21.2 (*)    MCV 61.4 (*)    MCH 15.7 (*)    MCHC 25.5 (*)    RDW 19.7 (*)    Platelets 658 (*)    All other components within normal limits  BASIC METABOLIC PANEL  IRON AND TIBC  FERRITIN  TSH  I-STAT BETA HCG BLOOD, ED ([facility], [facility], [facility] ONLY)  TYPE AND SCREEN  PREPARE RBC (CROSSMATCH)    EKG  EKG Interpretation None       Radiology No results found.  Procedures Procedures (including critical care time)  Medications Ordered in ED Medications  0.9 %  sodium chloride infusion (not administered)  sodium chloride 0.9 % bolus 1,000 mL (1,000 mLs Intravenous New Bag/Given 01/27/17 1840)  diphenhydrAMINE (BENADRYL) injection 25 mg (25 mg Intravenous Given 01/27/17 1840)  prochlorperazine (COMPAZINE) injection 10 mg (10 mg Intravenous Given 01/27/17  1841)  ketorolac (TORADOL) 30 MG/ML injection 30 mg (30 mg Intravenous Given 01/27/17 1840)     Initial Impression / Assessment and Plan / ED Course  I have reviewed the triage vital signs and the nursing notes.  Pertinent labs & imaging results that were available during my care of the patient were reviewed by me and considered in my medical decision making (see chart for details).     Patient presents with constant migraine for the past week with associated photophobia, blurred vision and dizziness. Patient reports having similar migraines in the past. Denies fever. VSS. Exam unremarkable. No neuro deficits. Plan to give patient IV fluids and migraine cocktail, labs pending.  Labs revealed hemoglobin 5.4, MVC 61.4. On reevaluation patient reports history of iron deficiency anemia and states she has had to have transfusions in the past related to her anemia. Denies hemoptysis, hematemesis, vaginal bleeding, rectal bleeding, hematuria. Patient reports she has not been taking her iron supplements for  the past 4-5 months. Discussed results and plan for admission and transfusion with patient. Consulted hospitalist for admission. Dr. Reesa Chew agrees to admission.   Final Clinical Impressions(s) / ED Diagnoses   Final diagnoses:  Anemia, unspecified type    New Prescriptions New Prescriptions   No medications on file     Nona Dell, PA-C 01/27/17 [place], MD 01/27/17 2238

## 2017-01-27 NOTE — ED Notes (Signed)
Lab called with critical Hgb 5.4.  Reported to PA

## 2017-01-28 DIAGNOSIS — E876 Hypokalemia: Secondary | ICD-10-CM | POA: Diagnosis present

## 2017-01-28 DIAGNOSIS — D649 Anemia, unspecified: Secondary | ICD-10-CM

## 2017-01-28 DIAGNOSIS — D509 Iron deficiency anemia, unspecified: Secondary | ICD-10-CM | POA: Diagnosis not present

## 2017-01-28 DIAGNOSIS — E039 Hypothyroidism, unspecified: Secondary | ICD-10-CM | POA: Diagnosis present

## 2017-01-28 DIAGNOSIS — Z79899 Other long term (current) drug therapy: Secondary | ICD-10-CM | POA: Diagnosis not present

## 2017-01-28 DIAGNOSIS — G43909 Migraine, unspecified, not intractable, without status migrainosus: Secondary | ICD-10-CM

## 2017-01-28 DIAGNOSIS — Z8249 Family history of ischemic heart disease and other diseases of the circulatory system: Secondary | ICD-10-CM | POA: Diagnosis not present

## 2017-01-28 DIAGNOSIS — Z9119 Patient's noncompliance with other medical treatment and regimen: Secondary | ICD-10-CM | POA: Diagnosis not present

## 2017-01-28 DIAGNOSIS — K219 Gastro-esophageal reflux disease without esophagitis: Secondary | ICD-10-CM | POA: Diagnosis present

## 2017-01-28 DIAGNOSIS — R110 Nausea: Secondary | ICD-10-CM | POA: Diagnosis present

## 2017-01-28 DIAGNOSIS — F4310 Post-traumatic stress disorder, unspecified: Secondary | ICD-10-CM | POA: Diagnosis present

## 2017-01-28 DIAGNOSIS — Z91030 Bee allergy status: Secondary | ICD-10-CM | POA: Diagnosis not present

## 2017-01-28 LAB — CBC
HEMATOCRIT: 22.6 % — AB (ref 36.0–46.0)
Hemoglobin: 6.2 g/dL — CL (ref 12.0–15.0)
MCH: 17.6 pg — ABNORMAL LOW (ref 26.0–34.0)
MCHC: 27.4 g/dL — ABNORMAL LOW (ref 30.0–36.0)
MCV: 64.2 fL — ABNORMAL LOW (ref 78.0–100.0)
PLATELETS: 497 10*3/uL — AB (ref 150–400)
RBC: 3.52 MIL/uL — AB (ref 3.87–5.11)
RDW: 21.1 % — AB (ref 11.5–15.5)
WBC: 7.2 10*3/uL (ref 4.0–10.5)

## 2017-01-28 LAB — COMPREHENSIVE METABOLIC PANEL
ALT: 8 U/L — ABNORMAL LOW (ref 14–54)
AST: 15 U/L (ref 15–41)
Albumin: 2.9 g/dL — ABNORMAL LOW (ref 3.5–5.0)
Alkaline Phosphatase: 69 U/L (ref 38–126)
Anion gap: 6 (ref 5–15)
BILIRUBIN TOTAL: 0.4 mg/dL (ref 0.3–1.2)
BUN: 10 mg/dL (ref 6–20)
CHLORIDE: 108 mmol/L (ref 101–111)
CO2: 23 mmol/L (ref 22–32)
CREATININE: 0.89 mg/dL (ref 0.44–1.00)
Calcium: 8.5 mg/dL — ABNORMAL LOW (ref 8.9–10.3)
Glucose, Bld: 138 mg/dL — ABNORMAL HIGH (ref 65–99)
POTASSIUM: 3.4 mmol/L — AB (ref 3.5–5.1)
Sodium: 137 mmol/L (ref 135–145)
TOTAL PROTEIN: 7 g/dL (ref 6.5–8.1)

## 2017-01-28 LAB — PROTIME-INR
INR: 1.1
Prothrombin Time: 14.2 seconds (ref 11.4–15.2)

## 2017-01-28 LAB — APTT: APTT: 30 s (ref 24–36)

## 2017-01-28 LAB — PREPARE RBC (CROSSMATCH)

## 2017-01-28 LAB — HIV ANTIBODY (ROUTINE TESTING W REFLEX): HIV SCREEN 4TH GENERATION: NONREACTIVE

## 2017-01-28 MED ORDER — DOCUSATE SODIUM 100 MG PO CAPS: 100.0000 mg | ORAL_CAPSULE | Freq: Two times a day (BID) | ORAL | Status: DC | PRN

## 2017-01-28 MED ORDER — FUROSEMIDE 10 MG/ML IJ SOLN
20.0000 mg | Freq: Once | INTRAMUSCULAR | Status: AC
  Administered 2017-01-28: 20 mg via INTRAVENOUS
  Filled 2017-01-28: qty 2

## 2017-01-28 MED ORDER — FERROUS SULFATE 325 (65 FE) MG PO TABS
325.0000 mg | ORAL_TABLET | Freq: Three times a day (TID) | ORAL | Status: DC
  Administered 2017-01-28 – 2017-01-29 (×4): 325 mg via ORAL
  Filled 2017-01-28 (×4): qty 1

## 2017-01-28 MED ORDER — PROMETHAZINE HCL 25 MG/ML IJ SOLN: 6.2500 mg | Freq: Four times a day (QID) | INTRAMUSCULAR | Status: DC | PRN

## 2017-01-28 MED ORDER — SODIUM CHLORIDE 0.9 % IV SOLN
Freq: Once | INTRAVENOUS | Status: AC
  Administered 2017-01-28: 08:00:00 via INTRAVENOUS

## 2017-01-28 MED ORDER — DIPHENHYDRAMINE HCL 25 MG PO CAPS
25.0000 mg | ORAL_CAPSULE | Freq: Once | ORAL | Status: AC
  Administered 2017-01-28: 25 mg via ORAL
  Filled 2017-01-28: qty 1

## 2017-01-28 MED ORDER — TRAMADOL HCL 50 MG PO TABS: 50.0000 mg | ORAL_TABLET | Freq: Four times a day (QID) | ORAL | Status: DC | PRN

## 2017-01-28 MED ORDER — ACETAMINOPHEN 325 MG PO TABS
650.0000 mg | ORAL_TABLET | Freq: Once | ORAL | Status: AC
  Administered 2017-01-28: 650 mg via ORAL
  Filled 2017-01-28: qty 2

## 2017-01-28 NOTE — Progress Notes (Signed)
Patient ID: Christina Mcbride, female   DOB: Aug 23, 1971, 46 y.o.   MRN: [id number]                                                                PROGRESS NOTE                                                                                                                                                                                                             Patient Demographics:    Anaija Sorrenti, is a 46 y.o. female, DOB - 30-Jun-1971, [id number]  Admit date - 01/27/2017   Admitting Physician Ankit Arsenio Loader, MD  Outpatient Primary MD for the patient is Lorayne Marek, MD  LOS - 0  Outpatient Specialists:    Chief Complaint  Patient presents with  . Migraine       Brief Narrative  46 y.o. female with medical history significant of migraine and iron deficiency anemia comes to the ED with complaints of severe headache. Patient states she has a history of severe migraine for which she used to take Topamax 50 mg orally twice a day and sumatriptan as needed but she ran out of it 2 months ago. Since then she has been having about 2-4 episodes of severe headache lasting for several hours. While she was in her medication she states she was getting maybe 1-2 episodes but there were no where as severe. Today she describes her headache as her typical migraine and is mostly located in front for head and it's aggravated by bright lights and loud sounds. During this past 2 months she had been taking Excedrin Migraine medication but no relief this time therefore came to the ED. On her routine labs she was noted to have hemoglobin of 5.4 and that further questioning she reports of increase in fatigue and shortness of breath with minimal exertion. She denies any blood in her stool but she does report of heavy periods at times. She was supposed to be on iron supplements as she had been previously diagnosed iron deficiency anemia but she also ran out of that 2 months ago. She used to get her medications from  community health physicians but within the last due to come issues with her son, she has put herself on a "back burner". No other complaints.  ED Course: In the ED for her headache she received IVF, Ketorolac IV and Compazine. On her labs she was noted to have Plt 658 and Hb 5.4.    Subjective:    Christina Mcbride today has, No headache, still slight dyspnea with exertion.  Otherwise doing well.  Denies abd pain,diarrhea, brbpr.  + black stool from iron.    Assessment  & Plan :    Principal Problem:   Symptomatic anemia Active Problems:   PTSD (post-traumatic stress disorder)   Migraine   Iron deficiency anemia   Chronic daily headache   1.  Anemia 2 units prbc today. Repeat cbc after transfusion Continue ferrous sulfate  2. Migraine Cont imitrex prn headache Start Phenergan prn nausea Compazine => too much somnolence  Code Status :  FULL CODE  Family Communication  : w patient  Disposition Plan  :  Home if hgb improved tomorrow  Barriers For Discharge :   Consults  :  none  Procedures  : none  DVT Prophylaxis  :  SCDs   Lab Results  Component Value Date   PLT 497 (H) 01/28/2017    Antibiotics  :    Anti-infectives    None        Objective:   Vitals:   01/27/17 2030 01/28/17 0015 01/28/17 0045 01/28/17 0338  BP: 136/68 127/67 125/76 126/66  Pulse: 85 82 73 69  Resp: 18 18 15 15   Temp: 98.4 F (36.9 C) 98.2 F (36.8 C) 98.3 F (36.8 C) 98.2 F (36.8 C)  TempSrc: Oral Oral Oral Oral  SpO2: 100% 100% 100% 99%  Weight: 108.9 kg (239 lb 15.9 oz)     Height: 5' 6.5" (1.689 m)       Wt Readings from Last 3 Encounters:  01/27/17 108.9 kg (239 lb 15.9 oz)  04/10/16 107.5 kg (237 lb)  03/01/16 113.4 kg (250 lb)     Intake/Output Summary (Last 24 hours) at 01/28/17 0723 Last data filed at 01/28/17 0338  Gross per 24 hour  Intake           1462.5 ml  Output                0 ml  Net           1462.5 ml     Physical Exam  Awake Alert,  Oriented X 3, No new F.N deficits, Normal affect [place].AT,PERRAL Supple Neck,No JVD, No cervical lymphadenopathy appriciated.  Symmetrical Chest wall movement, Good air movement bilaterally, CTAB RRR,No Gallops,Rubs or new Murmurs, No Parasternal Heave +ve B.Sounds, Abd Soft, No tenderness, No organomegaly appriciated, No rebound - guarding or rigidity. No Cyanosis, Clubbing or edema, No new Rash or bruise      Data Review:    CBC  Recent Labs Lab 01/27/17 1738 01/28/17 0531  WBC 8.9 7.2  HGB 5.4* 6.2*  HCT 21.2* 22.6*  PLT 658* 497*  MCV 61.4* 64.2*  MCH 15.7* 17.6*  MCHC 25.5* 27.4*  RDW 19.7* 21.1*  LYMPHSABS 2.5  --   MONOABS 0.4  --   EOSABS 0.3  --   BASOSABS 0.0  --     Chemistries   Recent Labs Lab 01/27/17 1738 01/28/17 0531  NA 138 137  K 3.9 3.4*  CL 105 108  CO2 23 23  GLUCOSE 84 138*  BUN 11 10  CREATININE 0.77 0.89  CALCIUM 9.1 8.5*  AST  --  15  ALT  --  8*  ALKPHOS  --  22  BILITOT  --  0.4   ------------------------------------------------------------------------------------------------------------------ No results for input(s): CHOL, HDL, LDLCALC, TRIG, CHOLHDL, LDLDIRECT in the last 72 hours.  Lab Results  Component Value Date   HGBA1C 5.7 (H) 12/27/2013   ------------------------------------------------------------------------------------------------------------------  Recent Labs  01/27/17 2017  TSH 2.633   ------------------------------------------------------------------------------------------------------------------  Recent Labs  01/27/17 2017  FERRITIN 8*  TIBC 398  IRON 12*    Coagulation profile  Recent Labs Lab 01/28/17 0531  INR 1.10    No results for input(s): DDIMER in the last 72 hours.  Cardiac Enzymes No results for input(s): CKMB, TROPONINI, MYOGLOBIN in the last 168 hours.  Invalid input(s):  CK ------------------------------------------------------------------------------------------------------------------ No results found for: BNP  Inpatient Medications  Scheduled Meds: . sodium chloride   Intravenous Once  . ferrous sulfate  325 mg Oral TID WC   Continuous Infusions: PRN Meds:.docusate sodium, ibuprofen, ketorolac, SUMAtriptan  Micro Results No results found for this or any previous visit (from the past 240 hour(s)).  Radiology Reports No results found.  Time Spent in minutes  30   Jani Gravel M.D on 01/28/2017 at 7:23 AM  Between 7am to 7pm - Pager - [phone]  After 7pm go to [email or web address] - password [facility]  [facility] -  Office  [phone]

## 2017-01-29 DIAGNOSIS — D509 Iron deficiency anemia, unspecified: Principal | ICD-10-CM

## 2017-01-29 LAB — TYPE AND SCREEN
ABO/RH(D): B POS
Antibody Screen: NEGATIVE
UNIT DIVISION: 0
UNIT DIVISION: 0
Unit division: 0
Unit division: 0

## 2017-01-29 LAB — BPAM RBC
Blood Product Expiration Date: 201803092359
Blood Product Expiration Date: 201803092359
Blood Product Expiration Date: 201803202359
Blood Product Expiration Date: 201803302359
ISSUE DATE / TIME: 201803041122
ISSUE DATE / TIME: 201803040019
ISSUE DATE / TIME: 201803040830
UNIT TYPE AND RH: 1700
UNIT TYPE AND RH: 7300
Unit Type and Rh: 1700
Unit Type and Rh: 7300

## 2017-01-29 LAB — CBC
HCT: 26.6 % — ABNORMAL LOW (ref 36.0–46.0)
Hemoglobin: 7.7 g/dL — ABNORMAL LOW (ref 12.0–15.0)
MCH: 19.1 pg — AB (ref 26.0–34.0)
MCHC: 28.9 g/dL — AB (ref 30.0–36.0)
MCV: 66 fL — AB (ref 78.0–100.0)
Platelets: 444 10*3/uL — ABNORMAL HIGH (ref 150–400)
RBC: 4.03 MIL/uL (ref 3.87–5.11)
RDW: 22 % — ABNORMAL HIGH (ref 11.5–15.5)
WBC: 8.4 10*3/uL (ref 4.0–10.5)

## 2017-01-29 MED ORDER — DOCUSATE SODIUM 100 MG PO CAPS: 100.0000 mg | ORAL_CAPSULE | Freq: Two times a day (BID) | ORAL | 0 refills | Status: DC | PRN

## 2017-01-29 NOTE — Progress Notes (Signed)
Reviewed discharge instructions/medication with patient.  Answered her questions.  Patient is ready for discharge.  Waiting on son to pick her up.

## 2017-01-29 NOTE — Discharge Summary (Addendum)
Christina Mcbride, is a 46 y.o. female  DOB June 10, 1971  MRN [id number].  Admission date:  01/27/2017  Admitting Physician  Damita Lack, MD  Discharge Date:  01/29/2017   Primary MD  Lorayne Marek, MD  Recommendations for primary care physician for things to follow:   Anemia, iron def Check cbc in 1 week Cont ferrous sulfate 325mg  po qday Cont colace 100mg  po bid  Migraine headache Cont topiramate 25mg  po bid Cont imitrex 50mg  po qday prn migraine  Elevated bp without diagnosis of hypertension Pt will need to have her pcp check her bp and possibly initate bp medication if elevated I stress the importance of this with the patient  Hypokalemia Check bmp in 1 week  Admission Diagnosis  Anemia, unspecified type [D64.9] Symptomatic anemia [D64.9]   Discharge Diagnosis  Anemia, unspecified type [D64.9] Symptomatic anemia [D64.9]   Principal Problem:   Symptomatic anemia Active Problems:   Anemia   PTSD (post-traumatic stress disorder)   Migraine   Iron deficiency anemia   Chronic daily headache      Past Medical History:  Diagnosis Date  . Anemia   . Blood transfusion without reported diagnosis   . Migraine   . PTSD (post-traumatic stress disorder)    reports daughter was murdered   . Tachycardia   . Thyroid disease   . Vitamin D deficiency     Past Surgical History:  Procedure Laterality Date  . TUBAL LIGATION         HPI  from the history and physical done on the day of admission:      46 y.o. female with medical history significant of migraine and iron deficiency anemia comes to the ED with complaints of severe headache. Patient states she has a history of severe migraine for which she used to take Topamax 50 mg orally twice a day and sumatriptan as needed but she ran out of it 2 months ago. Since then she has been having about 2-4 episodes of severe headache lasting  for several hours. While she was in her medication she states she was getting maybe 1-2 episodes but there were no where as severe. Today she describes her headache as her typical migraine and is mostly located in front for head and it's aggravated by bright lights and loud sounds. During this past 2 months she had been taking Excedrin Migraine medication but no relief this time therefore came to the ED. On her routine labs she was noted to have hemoglobin of 5.4 and that further questioning she reports of increase in fatigue and shortness of breath with minimal exertion. She denies any blood in her stool but she does report of heavy periods at times. She was supposed to be on iron supplements as she had been previously diagnosed iron deficiency anemia but she also ran out of that 2 months ago. She used to get her medications from community health physicians but within the last due to come issues with her son, she has put  herself on a "back burner". No other complaints.   ED Course: In the ED for her headache she received IVF, Ketorolac IV and Compazine. On her labs she was noted to have Plt 658 and Hb 5.4.   Hospital Course:     Pt was transfused with 1 unit prbc and hgb improved to 6.1,  Pt then received another 2 units of prbc and hgb improved. To 7.7   Pt received Kdur 20 meq po x1 for low potassium,    Pt headache improved with transfusion, and pt appears stable and requests discharge to home.    Follow UP w pcp in 1 week.  Follow-up Information    Deepak Advani Follow up in 1 week(s).            Consults obtained - none  Discharge Condition: stable  Diet and Activity recommendation: See Discharge Instructions below  Discharge Instructions         Discharge Medications     Allergies as of 01/29/2017      Reactions   Bee Venom Anaphylaxis      Medication List    TAKE these medications   docusate sodium 100 MG capsule Commonly known as:  COLACE Take 1 capsule (100 mg  total) by mouth 2 (two) times daily as needed for mild constipation.   ferrous sulfate 325 (65 FE) MG tablet Take 325 mg by mouth daily with breakfast.   SUMAtriptan 50 MG tablet Commonly known as:  IMITREX Take 50 mg by mouth every 2 (two) hours as needed for migraine. May repeat in 2 hours if headache persists or recurs.   topiramate 50 MG tablet Commonly known as:  TOPAMAX Take 50 mg by mouth 2 (two) times daily.       Major procedures and Radiology Reports - PLEASE review detailed and final reports for all details, in brief -      No results found.  Micro Results     No results found for this or any previous visit (from the past 240 hour(s)).     Today   Subjective    [name] today feels stronger,  Pt has  no headache,no chest abdominal pain,no new weakness tingling or numbness, feels much better wants to go home today.    Objective   Blood pressure (!) 153/94, pulse 90, temperature 99.1 F (37.3 C), temperature source Oral, resp. rate 18, height 5' 6.5" (1.689 m), weight 108.9 kg (239 lb 15.9 oz), SpO2 100 %.   Intake/Output Summary (Last 24 hours) at 01/29/17 0728 Last data filed at 01/28/17 1700  Gross per 24 hour  Intake             1375 ml  Output                0 ml  Net             1375 ml    Exam Awake Alert, Oriented x 3, No new F.N deficits, Normal affect [place].AT,PERRAL, pink conjunctiva.  Supple Neck,No JVD, No cervical lymphadenopathy appriciated.  Symmetrical Chest wall movement, Good air movement bilaterally, CTAB RRR,No Gallops,Rubs or new Murmurs, No Parasternal Heave +ve B.Sounds, Abd Soft, Non tender, No organomegaly appriciated, No rebound -guarding or rigidity. No Cyanosis, Clubbing or edema, No new Rash or bruise   Data Review   CBC w Diff:  Lab Results  Component Value Date   WBC 7.2 01/28/2017   HGB 6.2 (LL) 01/28/2017   HCT 22.6 (L) 01/28/2017  PLT 497 (H) 01/28/2017   LYMPHOPCT 28 01/27/2017   MONOPCT 5  01/27/2017   EOSPCT 3 01/27/2017   BASOPCT 0 01/27/2017    CMP:  Lab Results  Component Value Date   NA 137 01/28/2017   K 3.4 (L) 01/28/2017   CL 108 01/28/2017   CO2 23 01/28/2017   BUN 10 01/28/2017   CREATININE 0.89 01/28/2017   CREATININE 0.71 12/26/2013   PROT 7.0 01/28/2017   ALBUMIN 2.9 (L) 01/28/2017   BILITOT 0.4 01/28/2017   ALKPHOS 69 01/28/2017   AST 15 01/28/2017   ALT 8 (L) 01/28/2017  .   Total Time in preparing paper work, data evaluation and todays exam - 27 minutes  Jani Gravel M.D on 01/29/2017 at 7:28 AM  [facility]   Office  [phone]

## 2017-02-01 ENCOUNTER — Encounter (HOSPITAL_COMMUNITY): Payer: Self-pay | Admitting: *Deleted

## 2017-02-01 ENCOUNTER — Ambulatory Visit (HOSPITAL_COMMUNITY)
Admission: EM | Admit: 2017-02-01 | Discharge: 2017-02-01 | Disposition: A | Discharge status: Home / Self Care | Payer: Medicaid Other | Attending: Family Medicine | Admitting: Family Medicine

## 2017-02-01 DIAGNOSIS — L03114 Cellulitis of left upper limb: Secondary | ICD-10-CM

## 2017-02-01 DIAGNOSIS — I808 Phlebitis and thrombophlebitis of other sites: Secondary | ICD-10-CM

## 2017-02-01 MED ORDER — NAPROXEN 500 MG PO TABS: 500.0000 mg | ORAL_TABLET | Freq: Two times a day (BID) | ORAL | 0 refills | Status: DC

## 2017-02-01 MED ORDER — SULFAMETHOXAZOLE-TRIMETHOPRIM 800-160 MG PO TABS: 1.0000 | ORAL_TABLET | Freq: Two times a day (BID) | ORAL | 0 refills | Status: AC

## 2017-02-01 MED FILL — NAPROXEN 500 MG TABLET: 500 | 7 days supply | Qty: 14 | Fill #0

## 2017-02-01 MED FILL — SULFAMETHOXAZOLE/TMP DS TAB: 800-160 | 7 days supply | Qty: 14 | Fill #0

## 2017-02-01 NOTE — ED Triage Notes (Signed)
Pt  Reports   Symptoms   Of   Pain   Swelling   And  Redness  Of the  l  Arm  Pt  Reports    Symptoms  Of    Pain  And   Swelling     Of the affected arm    -    She   Stated she  Was    In hospital    Getting  Blood transfusion in the   l  Arm  And  The  Nurse was  Checking her  bp  In that  Arm

## 2017-02-01 NOTE — ED Provider Notes (Signed)
CSN: [id number]     Arrival date & time 02/01/17  1247 History   None    Chief Complaint  Patient presents with  . Arm Swelling   (Consider location/radiation/quality/duration/timing/severity/associated sxs/prior Treatment) Patient c/o left arm swelling and tenderness.  She states she was recently in hospital and did have transfusion through IV in left antecubital region and she has developed left biceps skin tenderness and erythema.  She c/o having a shooting pain down her left forearm from where the IV was inserted an   The history is provided by the patient.  Arm Injury  Location:  Arm Arm location:  L arm Injury: no   Pain details:    Quality:  Shooting   Radiates to:  L wrist   Severity:  Moderate   Onset quality:  Sudden   Duration:  2 days   Timing:  Constant   Progression:  Worsening Dislocation: no   Foreign body present:  No foreign bodies Tetanus status:  Unknown Prior injury to area:  No Relieved by:  Nothing Ineffective treatments:  None tried   Past Medical History:  Diagnosis Date  . Anemia   . Blood transfusion without reported diagnosis   . Migraine   . PTSD (post-traumatic stress disorder)    reports daughter was murdered   . Tachycardia   . Thyroid disease   . Vitamin D deficiency    Past Surgical History:  Procedure Laterality Date  . TUBAL LIGATION     Family History  Problem Relation Age of Onset  . Hypertension Mother   . Arthritis Mother   . Hypertension Son   . Cancer Maternal Aunt   . Diabetes Maternal Grandmother    Social History  Substance Use Topics  . Smoking status: Never Smoker  . Smokeless tobacco: Never Used  . Alcohol use No   OB History    No data available     Review of Systems  Constitutional: Negative.   HENT: Negative.   Eyes: Negative.   Respiratory: Negative.   Cardiovascular: Negative.   Gastrointestinal: Negative.   Endocrine: Negative.   Genitourinary: Negative.   Musculoskeletal: Positive for  arthralgias.  Skin: Positive for rash.    Allergies  Bee venom  Home Medications   Prior to Admission medications   Medication Sig Start Date End Date Taking? Authorizing Provider  docusate sodium (COLACE) 100 MG capsule Take 1 capsule (100 mg total) by mouth 2 (two) times daily as needed for mild constipation. 01/29/17   Jani Gravel, MD  ferrous sulfate 325 (65 FE) MG tablet Take 325 mg by mouth daily with breakfast.    Historical Provider, MD  naproxen (NAPROSYN) 500 MG tablet Take 1 tablet (500 mg total) by mouth 2 (two) times daily with a meal. 02/01/17   Lysbeth Penner, FNP  sulfamethoxazole-trimethoprim (BACTRIM DS,SEPTRA DS) 800-160 MG tablet Take 1 tablet by mouth 2 (two) times daily. 02/01/17 02/08/17  Lysbeth Penner, FNP  SUMAtriptan (IMITREX) 50 MG tablet Take 50 mg by mouth every 2 (two) hours as needed for migraine. May repeat in 2 hours if headache persists or recurs.    Historical Provider, MD  topiramate (TOPAMAX) 50 MG tablet Take 50 mg by mouth 2 (two) times daily.    Historical Provider, MD   Meds Ordered and Administered this Visit  Medications - No data to display  BP 132/78 (BP Location: Right Arm)   Pulse 78   Temp 98.6 F (37 C) (Oral)   Resp  18   SpO2 100%  No data found.   Physical Exam  Constitutional: She appears well-developed and well-nourished.  HENT:  Head: Normocephalic and atraumatic.  Eyes: Conjunctivae and EOM are normal. Pupils are equal, round, and reactive to light.  Neck: Normal range of motion. Neck supple.  Cardiovascular: Normal rate, regular rhythm and normal heart sounds.   Pulmonary/Chest: Effort normal and breath sounds normal.  Musculoskeletal: She exhibits tenderness.  TTP left biceps which is erythematous and tender and warm  Skin: Rash noted.  Left biceps cellulitis.  Nursing note and vitals reviewed.   Urgent Care Course     Procedures (including critical care time)  Labs Review Labs Reviewed - No data to  display  Imaging Review No results found.   Visual Acuity Review  Right Eye Distance:   Left Eye Distance:   Bilateral Distance:    Right Eye Near:   Left Eye Near:    Bilateral Near:         MDM   1. Cellulitis of left arm   2. Phlebitis of left arm    Naprosyn 500mg  one po bid x 7 days #14 Bactrim DS one po bidx 7 days #14      Lysbeth Penner, FNP 02/01/17 2024

## 2017-02-02 ENCOUNTER — Encounter: Payer: Self-pay | Admitting: Physician Assistant

## 2017-02-02 ENCOUNTER — Ambulatory Visit: Payer: Medicaid Other | Attending: Internal Medicine | Admitting: Physician Assistant

## 2017-02-02 VITALS — BP 127/81 | HR 104 | Temp 98.5°F | Resp 16 | Wt 237.6 lb

## 2017-02-02 DIAGNOSIS — R7309 Other abnormal glucose: Secondary | ICD-10-CM

## 2017-02-02 DIAGNOSIS — Z5189 Encounter for other specified aftercare: Secondary | ICD-10-CM | POA: Insufficient documentation

## 2017-02-02 DIAGNOSIS — E079 Disorder of thyroid, unspecified: Secondary | ICD-10-CM | POA: Diagnosis not present

## 2017-02-02 DIAGNOSIS — L03114 Cellulitis of left upper limb: Secondary | ICD-10-CM | POA: Diagnosis not present

## 2017-02-02 DIAGNOSIS — E559 Vitamin D deficiency, unspecified: Secondary | ICD-10-CM | POA: Diagnosis not present

## 2017-02-02 DIAGNOSIS — E876 Hypokalemia: Secondary | ICD-10-CM | POA: Insufficient documentation

## 2017-02-02 DIAGNOSIS — R739 Hyperglycemia, unspecified: Secondary | ICD-10-CM | POA: Diagnosis not present

## 2017-02-02 DIAGNOSIS — R000 Tachycardia, unspecified: Secondary | ICD-10-CM | POA: Insufficient documentation

## 2017-02-02 DIAGNOSIS — D500 Iron deficiency anemia secondary to blood loss (chronic): Secondary | ICD-10-CM | POA: Insufficient documentation

## 2017-02-02 DIAGNOSIS — R51 Headache: Secondary | ICD-10-CM | POA: Diagnosis present

## 2017-02-02 DIAGNOSIS — F4310 Post-traumatic stress disorder, unspecified: Secondary | ICD-10-CM | POA: Insufficient documentation

## 2017-02-02 DIAGNOSIS — G43909 Migraine, unspecified, not intractable, without status migrainosus: Secondary | ICD-10-CM | POA: Insufficient documentation

## 2017-02-02 LAB — CBC WITH DIFFERENTIAL/PLATELET
Basophils Absolute: 0 cells/uL (ref 0–200)
Basophils Relative: 0 %
EOS ABS: 258 {cells}/uL (ref 15–500)
Eosinophils Relative: 3 %
HCT: 29 % — ABNORMAL LOW (ref 35.0–45.0)
Hemoglobin: 8.5 g/dL — ABNORMAL LOW (ref 11.7–15.5)
LYMPHS PCT: 21 %
Lymphs Abs: 1806 cells/uL (ref 850–3900)
MCH: 19.8 pg — AB (ref 27.0–33.0)
MCHC: 29.3 g/dL — ABNORMAL LOW (ref 32.0–36.0)
MCV: 67.4 fL — AB (ref 80.0–100.0)
MONOS PCT: 6 %
MPV: 8.6 fL (ref 7.5–12.5)
Monocytes Absolute: 516 cells/uL (ref 200–950)
Neutro Abs: 6020 cells/uL (ref 1500–7800)
Neutrophils Relative %: 70 %
PLATELETS: 484 10*3/uL — AB (ref 140–400)
RBC: 4.3 MIL/uL (ref 3.80–5.10)
RDW: 27.1 % — AB (ref 11.0–15.0)
WBC: 8.6 10*3/uL (ref 3.8–10.8)

## 2017-02-02 LAB — COMPREHENSIVE METABOLIC PANEL
ALT: 6 U/L (ref 6–29)
AST: 11 U/L (ref 10–35)
Albumin: 3.6 g/dL (ref 3.6–5.1)
Alkaline Phosphatase: 83 U/L (ref 33–115)
BILIRUBIN TOTAL: 0.3 mg/dL (ref 0.2–1.2)
BUN: 14 mg/dL (ref 7–25)
CO2: 22 mmol/L (ref 20–31)
CREATININE: 1.08 mg/dL (ref 0.50–1.10)
Calcium: 8.6 mg/dL (ref 8.6–10.2)
Chloride: 104 mmol/L (ref 98–110)
Glucose, Bld: 87 mg/dL (ref 65–99)
Potassium: 4 mmol/L (ref 3.5–5.3)
SODIUM: 135 mmol/L (ref 135–146)
Total Protein: 7.6 g/dL (ref 6.1–8.1)

## 2017-02-02 LAB — POCT GLYCOSYLATED HEMOGLOBIN (HGB A1C): HEMOGLOBIN A1C: 5.4

## 2017-02-02 NOTE — Progress Notes (Signed)
Patient ID: Christina Mcbride, female   DOB: [birth year], 46 y.o.   MRN: [id number]    Christina Mcbride, is a 46 y.o. female  [id number]  RCV:[id number]  DOB - 1971-10-16  Subjective:  Chief Complaint and HPI: Christina Mcbride is a 46 y.o. female here today to establish care and for a follow up visitAfter recent hospitalization 01/27/2017-01/29/2017 and an ED visit 02/01/2017.  The patient presented to the ED on 01/27/2017 c/o severe HA and was out of Topamax and Imitrex X 2 months. Hgb was found to be 5.4 and she was admitted.  She also reported vague SOB and more fatigue than usual for the last couple of months.  She was supposed to be on Iron supplements but had ran out of those a couple of months ago.  She has not been seen in our office in a couple of years and has not been being seen regularly by any PCP. HA was treated with ketorolac and compazine with resolution in the hospital. Patient was transfused with 3 units PRBC and Hgb had increased to 7.7 at time of discharge.  She was discharged on Ferrous sulfate 325mg  daily, Imitrex prn as directed, and topamax was restarted at 50mg  bid.  She went back to the ED yesterday with cellulitis at the infusion site and she was started on Septra and Naprosyn.  The cellulitis of the L arm is believed to be from the IV infusion sight.  No f/c.  She is having some intermittent paresthesias in her L hand and arm.  She is R hand dominant.  Today she presents for hospital follow-up.  She did not get the Septra until today so her arm is about the same as yesterday. She c/o continued fatigue but feeling much better overall than at hospitalization. No f/c.  L arm is painful.  Cant sleep on L side.  Anemia has been long-standing from heavy periods.  This is the 3rd time she has had to have transfusions.  Her Hgb has been as low as 4 by history.  Today she denies SOB, CP, tinnitus, palpitations.  ED/Hospital notes reviewed and summarized above. Social History:  No alcohol, no  drugs, no tobacco Family history:  Mom and son with htn  ROS:   Constitutional:  No f/c, No night sweats, No unexplained weight loss. EENT:  No vision changes, No blurry vision, No hearing changes. No mouth, throat, or ear problems.  Respiratory: No cough, No SOB Cardiac: No CP, no palpitations GI:  No abd pain, No N/V/D. GU: No Urinary s/sx Musculoskeletal: No joint pain Neuro: headaches much improved, no dizziness, no motor weakness.  Skin: No rash Endocrine:  No polydipsia. No polyuria.  Psych: Denies SI/HI  No problems updated.  ALLERGIES: Allergies  Allergen Reactions  . Bee Venom Anaphylaxis    PAST MEDICAL HISTORY: Past Medical History:  Diagnosis Date  . Anemia   . Blood transfusion without reported diagnosis   . Migraine   . PTSD (post-traumatic stress disorder)    reports daughter was murdered   . Tachycardia   . Thyroid disease   . Vitamin D deficiency     MEDICATIONS AT HOME: Prior to Admission medications   Medication Sig Start Date End Date Taking? Authorizing Provider  ferrous sulfate 325 (65 FE) MG tablet Take 325 mg by mouth daily with breakfast.   Yes Historical Provider, MD  naproxen (NAPROSYN) 500 MG tablet Take 1 tablet (500 mg total) by mouth 2 (two) times daily with a meal. 02/01/17  Yes Lysbeth Penner, FNP  sulfamethoxazole-trimethoprim (BACTRIM DS,SEPTRA DS) 800-160 MG tablet Take 1 tablet by mouth 2 (two) times daily. 02/01/17 02/08/17 Yes Lysbeth Penner, FNP  docusate sodium (COLACE) 100 MG capsule Take 1 capsule (100 mg total) by mouth 2 (two) times daily as needed for mild constipation. Patient not taking: Reported on 02/02/2017 01/29/17   Jani Gravel, MD  SUMAtriptan (IMITREX) 50 MG tablet Take 50 mg by mouth every 2 (two) hours as needed for migraine. May repeat in 2 hours if headache persists or recurs.    Historical Provider, MD  topiramate (TOPAMAX) 50 MG tablet Take 50 mg by mouth 2 (two) times daily.    Historical Provider, MD      Objective:  EXAM:   Vitals:   02/02/17 1426  BP: 127/81  Pulse: (!) 104  Resp: 16  Temp: 98.5 F (36.9 C)  TempSrc: Oral  SpO2: 99%  Weight: 237 lb 9.6 oz (107.8 kg)    General appearance : A&OX3. NAD. Non-toxic-appearing HEENT: Atraumatic and Normocephalic.  PERRLA. EOM intact.  TM clear B. Mouth-MMM. Neck: supple, no JVD. No cervical lymphadenopathy. No thyromegaly Chest/Lungs:  Breathing-non-labored, Good air entry bilaterally, breath sounds normal without rales, rhonchi, or wheezing  CVS: S1 S2 regular, no murmurs, gallops, rubs  Extremities: L vs R arm examined.  Both arms with full S&ROM with normal and symmetrical grip/supination/pronation/overall strength and sensory in tact.  L upper arm medial aspect is slightly swollen and warm to the touch with no obvious abscess or fluctuance. There is minimal erythema of this area as well.  IV sites appear to be healing. Otherwise L arm appears normal. Radial pulse =B. Bilateral Lower Ext shows no edema, both legs are warm to touch with = pulse throughout Neurology:  CN II-XII grossly intact, Non focal.   Psych:  TP linear. J/I WNL. Normal speech. Appropriate eye contact and affect.  Skin:  No Rash  Data Review Lab Results  Component Value Date   HGBA1C 5.4 02/02/2017   HGBA1C 5.7 (H) 12/27/2013   HGBA1C 4.9 12/26/2013     Assessment & Plan   1. Iron deficiency anemia due to chronic blood loss Increase iron to bid for now.  Increase water intake to 100 ounces daily - CBC with Differential/Platelet  2. Migraine without status migrainosus, not intractable, unspecified migraine type Take topamax bid and imitrex prn - Comprehensive metabolic panel  3. Elevated glucose A1C is normal - POCT glycosylated hemoglobin (Hb A1C) I have had a lengthy discussion and provided education about insulin resistance and the intake of too much sugar/refined carbohydrates.  I have advised the patient to work at a goal of eliminating  sugary drinks, candy, desserts, sweets, refined sugars, processed foods, and white carbohydrates.  The patient expresses understanding.    4. Hypokalemia - Comprehensive metabolic panel  5. Cellulitis of left arm Take Septra.  First dose was taken in office.  Take second dose tonight.  She did not pick up the prescription yesterday. Elevate your arm above your heart as much as possible to keep the swelling down. Please allow yourself rest. Spent >13mins face to face with patient in counseling regarding, anemia, Iron replacement, adequate hydration, cellulitis care in addition to time spent reviewing medical records which was significant. Patient have been counseled extensively about nutrition and exercise  Return in about 1 week (around 02/09/2017) for recheck cellulitis and anemia with me.  The patient was given clear instructions to go to ER or return  to medical center if symptoms don't improve, worsen or new problems develop. The patient verbalized understanding. The patient was told to call to get lab results if they haven't heard anything in the next week.    Freeman Caldron, PA-C [facility] [place] [facility], [place]   02/02/2017, 2:47 PM

## 2017-02-02 NOTE — Patient Instructions (Signed)
Take Iron tablets twice daily for now.   Elevate your arm above your heart as much as possible to keep the swelling down. Please allow yourself rest. Increase your water intake to at least 100 ounces daily.  Limit sugar intake.

## 2017-02-20 ENCOUNTER — Telehealth: Payer: Self-pay

## 2017-02-26 ENCOUNTER — Ambulatory Visit: Payer: Medicaid Other | Attending: Family Medicine | Admitting: Family Medicine

## 2017-02-26 ENCOUNTER — Encounter: Payer: Self-pay | Admitting: Family Medicine

## 2017-02-26 VITALS — BP 138/81 | HR 91 | Temp 98.2°F | Resp 18 | Ht 66.0 in | Wt 239.6 lb

## 2017-02-26 DIAGNOSIS — Z23 Encounter for immunization: Secondary | ICD-10-CM | POA: Diagnosis not present

## 2017-02-26 DIAGNOSIS — D649 Anemia, unspecified: Secondary | ICD-10-CM | POA: Insufficient documentation

## 2017-02-26 DIAGNOSIS — Z9851 Tubal ligation status: Secondary | ICD-10-CM | POA: Diagnosis not present

## 2017-02-26 DIAGNOSIS — Z862 Personal history of diseases of the blood and blood-forming organs and certain disorders involving the immune mechanism: Secondary | ICD-10-CM

## 2017-02-26 DIAGNOSIS — R202 Paresthesia of skin: Secondary | ICD-10-CM

## 2017-02-26 DIAGNOSIS — Z0000 Encounter for general adult medical examination without abnormal findings: Secondary | ICD-10-CM

## 2017-02-26 DIAGNOSIS — Z8659 Personal history of other mental and behavioral disorders: Secondary | ICD-10-CM

## 2017-02-26 DIAGNOSIS — L680 Hirsutism: Secondary | ICD-10-CM

## 2017-02-26 DIAGNOSIS — N920 Excessive and frequent menstruation with regular cycle: Secondary | ICD-10-CM | POA: Insufficient documentation

## 2017-02-26 DIAGNOSIS — F4310 Post-traumatic stress disorder, unspecified: Secondary | ICD-10-CM | POA: Insufficient documentation

## 2017-02-26 DIAGNOSIS — N911 Secondary amenorrhea: Secondary | ICD-10-CM | POA: Insufficient documentation

## 2017-02-26 LAB — POCT URINE PREGNANCY: PREG TEST UR: NEGATIVE

## 2017-02-26 MED ORDER — ESCITALOPRAM OXALATE 10 MG PO TABS: 10.0000 mg | ORAL_TABLET | Freq: Every day | ORAL | 2 refills | Status: DC

## 2017-02-26 MED ORDER — FERROUS SULFATE 325 (65 FE) MG PO TABS: 325.0000 mg | ORAL_TABLET | Freq: Three times a day (TID) | ORAL | 2 refills | Status: DC

## 2017-02-26 NOTE — Progress Notes (Signed)
Subjective:  Patient ID: Christina Mcbride, female    DOB: 21-Feb-1971  Age: 46 y.o. MRN: [id number]  CC: No chief complaint on file.   HPI [name] presents for   IDA: History of heavy period w/ blood transfusion for anemia in the past. Adherence with iron supplements. Reports LPM was October 2017. Reports history of tubal ligation. Denies any CP or SOB. She does reports fatigue.  History of left arm cellulitis: She has completed course of ABT. Reports swelling, redness, and pain have resolved. Reports occasional tingling to left extremity. Denies any history of injury, decreased grip, temperature, or color changes.   PTSD: Since 2002. She reports PTSD related to 3 y.o. Daughter being murdered. Reports being followed by [facility] for psych. services. Denies any SI/HI. She request medication for symptoms. She is also agreeable to receiving information for additional counseling resources.   Outpatient Medications Prior to Visit  Medication Sig Dispense Refill  . docusate sodium (COLACE) 100 MG capsule Take 1 capsule (100 mg total) by mouth 2 (two) times daily as needed for mild constipation. (Patient not taking: Reported on 02/02/2017) 10 capsule 0  . naproxen (NAPROSYN) 500 MG tablet Take 1 tablet (500 mg total) by mouth 2 (two) times daily with a meal. 14 tablet 0  . SUMAtriptan (IMITREX) 50 MG tablet Take 50 mg by mouth every 2 (two) hours as needed for migraine. May repeat in 2 hours if headache persists or recurs.    . topiramate (TOPAMAX) 50 MG tablet Take 50 mg by mouth 2 (two) times daily.    . ferrous sulfate 325 (65 FE) MG tablet Take 325 mg by mouth daily with breakfast.     No facility-administered medications prior to visit.     ROS Review of Systems  Constitutional: Negative.   HENT: Negative.   Eyes: Negative.   Respiratory: Negative.   Cardiovascular: Negative.   Gastrointestinal: Negative.   Musculoskeletal: Negative.   Skin: Negative.   Psychiatric/Behavioral:         History of PTSD    Objective:  BP 138/81 (BP Location: Left Arm, Patient Position: Sitting, Cuff Size: Large)   Pulse 91   Temp 98.2 F (36.8 C) (Oral)   Resp 18   Ht 5\' 6"  (1.676 m)   Wt 239 lb 9.6 oz (108.7 kg)   SpO2 100%   BMI 38.67 kg/m   BP/Weight 02/26/2017 07/30/2354 05/29/2201  Systolic BP 542 706 237  Diastolic BP 81 81 78  Wt. (Lbs) 239.6 237.6 -  BMI 38.67 37.78 -    Physical Exam  HENT:  Head: Normocephalic.  Right Ear: External ear normal.  Left Ear: External ear normal.  Nose: Nose normal.  Mouth/Throat: Oropharynx is clear and moist.  Eyes: Conjunctivae are normal. Pupils are equal, round, and reactive to light.  Neck: Normal range of motion. Neck supple. No JVD present.  Cardiovascular: Normal rate, regular rhythm, normal heart sounds and intact distal pulses.   Pulmonary/Chest: Effort normal and breath sounds normal.  Abdominal: Soft. Bowel sounds are normal.  Musculoskeletal: Normal range of motion.  Skin: Skin is warm and dry. No erythema.  Nursing note and vitals reviewed.   Assessment & Plan:   Problem List Items Addressed This Visit    None    Visit Diagnoses    History of anemia    -  Primary   Relevant Medications   ferrous sulfate 325 (65 FE) MG tablet   Other Relevant Orders   CBC  with Differential (Completed)   Fe+TIBC+Fer (Completed)   Basic Metabolic Panel (Completed)   Tingling of left upper extremity       Relevant Orders   Vitamin D, 25-hydroxy (Completed)   Vitamin B12 (Completed)   History of posttraumatic stress disorder (PTSD)       Relevant Medications   escitalopram (LEXAPRO) 10 MG tablet   Secondary amenorrhea       Suspect patient is experiencing periomenopausal symptoms.    Relevant Orders   FSH/LH (Completed)   POCT urine pregnancy (Completed)   Hirsutism       Healthcare maintenance       Relevant Orders   Tdap vaccine greater than or equal to 7yo IM (Completed)      Meds ordered this encounter   Medications  . ferrous sulfate 325 (65 FE) MG tablet    Sig: Take 1 tablet (325 mg total) by mouth 3 (three) times daily with meals.    Dispense:  90 tablet    Refill:  2    Order Specific Question:   Supervising Provider    Answer:   Tresa Garter [id number]  . escitalopram (LEXAPRO) 10 MG tablet    Sig: Take 1 tablet (10 mg total) by mouth daily. After 4 weeks , may increase dose to 2 tablets ( 20 mg total) daily.    Dispense:  60 tablet    Refill:  2    Order Specific Question:   Supervising Provider    Answer:   Tresa Garter [id number]    Follow-up: Return if symptoms worsen or fail to improve. Return in about 8 weeks (around 04/23/2017), for PTSD/ Anemia.   Alfonse Spruce FNP

## 2017-02-26 NOTE — Progress Notes (Signed)
Patient is here for rapid heart beat  Patient is here for vitamin D   Patient stated that she been feeling tingling in her left arm that comes and goes  Patient is taking iron pills  Patient has not eaten for today

## 2017-02-26 NOTE — Patient Instructions (Addendum)
Iron Deficiency Anemia, Adult Iron-deficiency anemia is when you have a low amount of red blood cells or hemoglobin. This happens because you have too little iron in your body. Hemoglobin carries oxygen to parts of the body. Anemia can cause your body to not get enough oxygen. It may or may not cause symptoms. Follow these instructions at home: Medicines   Take over-the-counter and prescription medicines only as told by your doctor. This includes iron pills (supplements) and vitamins.  If you cannot handle taking iron pills by mouth, ask your doctor about getting iron through:  A vein (intravenously).  A shot (injection) into a muscle.  Take iron pills when your stomach is empty. If you cannot handle this, take them with food.  Do not drink milk or take antacids at the same time as your iron pills.  To prevent trouble pooping (constipation), eat fiber or take medicine (stool softener) as told by your doctor. Eating and drinking   Talk with your doctor before changing the foods you eat. He or she may tell you to eat foods that have a lot of iron, such as:  Liver.  Lowfat (lean) beef.  Breads and cereals that have iron added to them (fortified breads and cereals).  Eggs.  Dried fruit.  Dark green, leafy vegetables.  Drink enough fluid to keep your pee (urine) clear or pale yellow.  Eat fresh fruits and vegetables that are high in vitamin C. They help your body to use iron. Foods with a lot of vitamin C include:  Oranges.  Peppers.  Tomatoes.  Mangoes. General instructions   Return to your normal activities as told by your doctor. Ask your doctor what activities are safe for you.  Keep yourself clean, and keep things clean around you (your surroundings). Anemia can make you get sick more easily.  Keep all follow-up visits as told by your doctor. This is important. Contact a doctor if:  You feel sick to your stomach (nauseous).  You throw up (vomit).  You feel  weak.  You are sweating for no clear reason.  You have trouble pooping, such as:  Pooping (having a bowel movement) less than 3 times a week.  Straining to poop.  Having poop that is hard, dry, or larger than normal.  Feeling full or bloated.  Pain in the lower belly.  Not feeling better after pooping. Get help right away if:  You pass out (faint). If this happens, do not drive yourself to the hospital. Call your local emergency services (911 in the [place]  You have chest pain.  You have shortness of breath that:  Is very bad.  Gets worse with physical activity.  You have a fast heartbeat.  You get light-headed when getting up from sitting or lying down. This information is not intended to replace advice given to you by your health care provider. Make sure you discuss any questions you have with your health care provider. Document Released: 12/16/2010 Document Revised: 08/02/2016 Document Reviewed: 08/02/2016 Elsevier Interactive Patient Education  2017 [employer]Secondary Amenorrhea Secondary amenorrhea is the stopping of menstrual flow for 3-6 months in a female who has previously had periods. There are many possible causes. Most of these causes are not serious. Usually, treating the underlying problem causing the loss of menses will return your periods to normal. What are the causes? Some common and uncommon causes of not menstruating include:  Malnutrition.  Low blood sugar (hypoglycemia).  Polycystic ovary disease.  Stress or fear.  Breastfeeding.  Hormone imbalance.  Ovarian failure.  Medicines.  Extreme obesity.  Cystic fibrosis.  Low body weight or drastic weight reduction from any cause.  Early menopause.  Removal of ovaries or uterus.  Contraceptives.  Illness.  Long-term (chronic) illnesses.  Cushing syndrome.  Thyroid problems.  Birth control pills, patches, or vaginal rings for birth control. What increases the risk? You  may be at greater risk of secondary amenorrhea if:  You have a family history of this condition.  You have an eating disorder.  You do athletic training. How is this diagnosed? A diagnosis is made by your health care provider taking a medical history and doing a physical exam. This will include a pelvic exam to check for problems with your reproductive organs. Pregnancy must be ruled out. Often, numerous blood tests are done to measure different hormones in the body. Urine testing may be done. Specialized exams (ultrasound, CT scan, MRI, or hysteroscopy) may have to be done as well as measuring the body mass index (BMI). How is this treated? Treatment depends on the cause of the amenorrhea. If an eating disorder is present, this can be treated with an adequate diet and therapy. Chronic illnesses may improve with treatment of the illness. Amenorrhea may be corrected with medicines, lifestyle changes, or surgery. If the amenorrhea cannot be corrected, it is sometimes possible to create a false menstruation with medicines. Follow these instructions at home:  Maintain a healthy diet.  Manage weight problems.  Exercise regularly but not excessively.  Get adequate sleep.  Manage stress.  Be aware of changes in your menstrual cycle. Keep a record of when your periods occur. Note the date your period starts, how long it lasts, and any problems. Contact a health care provider if: Your symptoms do not get better with treatment. This information is not intended to replace advice given to you by your health care provider. Make sure you discuss any questions you have with your health care provider. Document Released: 12/25/2006 Document Revised: 04/20/2016 Document Reviewed: 05/01/2013 Elsevier Interactive Patient Education  2017 [employer].

## 2017-02-27 LAB — CBC WITH DIFFERENTIAL/PLATELET
BASOS: 0 %
Basophils Absolute: 0 10*3/uL (ref 0.0–0.2)
EOS (ABSOLUTE): 0.1 10*3/uL (ref 0.0–0.4)
Eos: 2 %
HEMATOCRIT: 33.6 % — AB (ref 34.0–46.6)
HEMOGLOBIN: 9.7 g/dL — AB (ref 11.1–15.9)
IMMATURE GRANS (ABS): 0 10*3/uL (ref 0.0–0.1)
Immature Granulocytes: 0 %
LYMPHS: 23 %
Lymphocytes Absolute: 1.7 10*3/uL (ref 0.7–3.1)
MCH: 20 pg — ABNORMAL LOW (ref 26.6–33.0)
MCHC: 28.9 g/dL — AB (ref 31.5–35.7)
MCV: 69 fL — ABNORMAL LOW (ref 79–97)
Monocytes Absolute: 0.4 10*3/uL (ref 0.1–0.9)
Monocytes: 6 %
NEUTROS PCT: 69 %
Neutrophils Absolute: 5.1 10*3/uL (ref 1.4–7.0)
Platelets: 671 10*3/uL — ABNORMAL HIGH (ref 150–379)
RBC: 4.84 x10E6/uL (ref 3.77–5.28)
RDW: 25 % — AB (ref 12.3–15.4)
WBC: 7.4 10*3/uL (ref 3.4–10.8)

## 2017-02-27 LAB — VITAMIN D 25 HYDROXY (VIT D DEFICIENCY, FRACTURES): Vit D, 25-Hydroxy: 11.9 ng/mL — ABNORMAL LOW (ref 30.0–100.0)

## 2017-02-27 LAB — FE+TIBC+FER
Ferritin: 8 ng/mL — ABNORMAL LOW (ref 15–150)
IRON SATURATION: 10 % — AB (ref 15–55)
IRON: 36 ug/dL (ref 27–159)
Total Iron Binding Capacity: 358 ug/dL (ref 250–450)
UIBC: 322 ug/dL (ref 131–425)

## 2017-02-27 LAB — FSH/LH
FSH: 43 m[IU]/mL
LH: 37.5 m[IU]/mL

## 2017-02-27 LAB — BASIC METABOLIC PANEL
BUN/Creatinine Ratio: 10 (ref 9–23)
BUN: 8 mg/dL (ref 6–24)
CALCIUM: 9.8 mg/dL (ref 8.7–10.2)
CHLORIDE: 102 mmol/L (ref 96–106)
CO2: 21 mmol/L (ref 18–29)
Creatinine, Ser: 0.82 mg/dL (ref 0.57–1.00)
GFR calc Af Amer: 99 mL/min/{1.73_m2} (ref >59)
GFR, EST NON AFRICAN AMERICAN: 86 mL/min/{1.73_m2} (ref >59)
GLUCOSE: 95 mg/dL (ref 65–99)
POTASSIUM: 4.8 mmol/L (ref 3.5–5.2)
SODIUM: 139 mmol/L (ref 134–144)

## 2017-02-27 LAB — VITAMIN B12: VITAMIN B 12: 689 pg/mL (ref 232–1245)

## 2017-03-01 ENCOUNTER — Other Ambulatory Visit: Payer: Self-pay | Admitting: Family Medicine

## 2017-03-01 DIAGNOSIS — R790 Abnormal level of blood mineral: Secondary | ICD-10-CM

## 2017-03-01 DIAGNOSIS — D509 Iron deficiency anemia, unspecified: Secondary | ICD-10-CM

## 2017-03-01 DIAGNOSIS — E559 Vitamin D deficiency, unspecified: Secondary | ICD-10-CM

## 2017-03-01 MED ORDER — VITAMIN D (ERGOCALCIFEROL) 1.25 MG (50000 UNIT) PO CAPS
50000.0000 [IU] | ORAL_CAPSULE | ORAL | 0 refills | Status: AC
Start: 2017-03-01 — End: 2017-04-20

## 2017-03-01 NOTE — Telephone Encounter (Signed)
CMA call to inform patient about lab results  Patient did not answer but left a VM stating the reason of the call & to call back

## 2017-03-01 NOTE — Telephone Encounter (Signed)
-----   Message from Alfonse Spruce, [place] sent at 03/01/2017  8:25 AM EDT ----- Blood test indicate you have stable iron deficiency anemia. Your levels are improving but iron stores are still low.  Continue to take your iron supplements. You can take with orange juice for better absorption. Increase your dietary iron intake. Good sources of iron include dark green leafy vegetables, meats, beans, and iron fortified cereals. Recommend recheck in 2 months.  Vitamin D level was low. Vitamin D helps to keep bones strong. You were prescribed ergocalciferol (capsules) to increase your vitamin-d level. Once finished start taking OTC vitamin d supplement with 800 international units (IU) of vitamin-d per day. Recommend recheck in 2 monts. Labs that evaluate ovary function indicate you are perimenopausal. This can cause irregular menstrual cycles. Labs normal.

## 2017-03-01 NOTE — Telephone Encounter (Signed)
Patient return CMA call   Patient Verify DOB   Patient was aware and understood  

## 2017-04-24 ENCOUNTER — Ambulatory Visit: Payer: Medicaid Other | Admitting: Family Medicine

## 2017-04-25 ENCOUNTER — Ambulatory Visit: Payer: Medicaid Other | Admitting: Family Medicine

## 2017-04-26 ENCOUNTER — Ambulatory Visit: Payer: Medicaid Other | Admitting: Family Medicine

## 2017-12-13 ENCOUNTER — Encounter (HOSPITAL_COMMUNITY): Payer: Self-pay | Admitting: Emergency Medicine

## 2017-12-13 ENCOUNTER — Inpatient Hospital Stay (HOSPITAL_COMMUNITY)
Admission: EM | Admit: 2017-12-13 | Discharge: 2017-12-15 | DRG: 103 | Disposition: A | Discharge status: Home / Self Care | Payer: Medicaid Other | Attending: Family Medicine | Admitting: Family Medicine

## 2017-12-13 DIAGNOSIS — D649 Anemia, unspecified: Secondary | ICD-10-CM | POA: Diagnosis present

## 2017-12-13 DIAGNOSIS — Z79899 Other long term (current) drug therapy: Secondary | ICD-10-CM

## 2017-12-13 DIAGNOSIS — F4310 Post-traumatic stress disorder, unspecified: Secondary | ICD-10-CM | POA: Diagnosis present

## 2017-12-13 DIAGNOSIS — D509 Iron deficiency anemia, unspecified: Secondary | ICD-10-CM | POA: Diagnosis present

## 2017-12-13 DIAGNOSIS — G43909 Migraine, unspecified, not intractable, without status migrainosus: Secondary | ICD-10-CM | POA: Diagnosis present

## 2017-12-13 DIAGNOSIS — G43009 Migraine without aura, not intractable, without status migrainosus: Principal | ICD-10-CM | POA: Diagnosis present

## 2017-12-13 DIAGNOSIS — E079 Disorder of thyroid, unspecified: Secondary | ICD-10-CM | POA: Diagnosis present

## 2017-12-13 DIAGNOSIS — K219 Gastro-esophageal reflux disease without esophagitis: Secondary | ICD-10-CM | POA: Diagnosis present

## 2017-12-13 LAB — CBC WITH DIFFERENTIAL/PLATELET
BASOS ABS: 0 10*3/uL (ref 0.0–0.1)
Basophils Relative: 0 %
EOS ABS: 0.1 10*3/uL (ref 0.0–0.7)
Eosinophils Relative: 1 %
HCT: 15.5 % — ABNORMAL LOW (ref 36.0–46.0)
Hemoglobin: 4 g/dL — CL (ref 12.0–15.0)
LYMPHS ABS: 2.4 10*3/uL (ref 0.7–4.0)
Lymphocytes Relative: 24 %
MCH: 16 pg — ABNORMAL LOW (ref 26.0–34.0)
MCHC: 25.8 g/dL — ABNORMAL LOW (ref 30.0–36.0)
MCV: 62 fL — ABNORMAL LOW (ref 78.0–100.0)
MONO ABS: 0.6 10*3/uL (ref 0.1–1.0)
Monocytes Relative: 6 %
Neutro Abs: 7.1 10*3/uL (ref 1.7–7.7)
Neutrophils Relative %: 69 %
PLATELETS: 493 10*3/uL — AB (ref 150–400)
RBC: 2.5 MIL/uL — AB (ref 3.87–5.11)
RDW: 20.9 % — AB (ref 11.5–15.5)
WBC: 10.2 10*3/uL (ref 4.0–10.5)

## 2017-12-13 LAB — BASIC METABOLIC PANEL
ANION GAP: 8 (ref 5–15)
BUN: 15 mg/dL (ref 6–20)
CALCIUM: 8.6 mg/dL — AB (ref 8.9–10.3)
CO2: 22 mmol/L (ref 22–32)
CREATININE: 1.02 mg/dL — AB (ref 0.44–1.00)
Chloride: 109 mmol/L (ref 101–111)
GLUCOSE: 106 mg/dL — AB (ref 65–99)
Potassium: 4 mmol/L (ref 3.5–5.1)
Sodium: 139 mmol/L (ref 135–145)

## 2017-12-13 NOTE — ED Notes (Signed)
Dr. [name] Heck aware Hgb 4

## 2017-12-13 NOTE — ED Triage Notes (Signed)
Patient reports migraine headache with dizziness yesterday , denies nausea or photophobia.

## 2017-12-14 ENCOUNTER — Encounter (HOSPITAL_COMMUNITY): Payer: Self-pay | Admitting: Internal Medicine

## 2017-12-14 DIAGNOSIS — D649 Anemia, unspecified: Secondary | ICD-10-CM

## 2017-12-14 DIAGNOSIS — D509 Iron deficiency anemia, unspecified: Secondary | ICD-10-CM | POA: Diagnosis present

## 2017-12-14 DIAGNOSIS — F4310 Post-traumatic stress disorder, unspecified: Secondary | ICD-10-CM | POA: Diagnosis present

## 2017-12-14 DIAGNOSIS — G43009 Migraine without aura, not intractable, without status migrainosus: Principal | ICD-10-CM

## 2017-12-14 DIAGNOSIS — K219 Gastro-esophageal reflux disease without esophagitis: Secondary | ICD-10-CM | POA: Diagnosis present

## 2017-12-14 DIAGNOSIS — Z79899 Other long term (current) drug therapy: Secondary | ICD-10-CM | POA: Diagnosis not present

## 2017-12-14 DIAGNOSIS — E079 Disorder of thyroid, unspecified: Secondary | ICD-10-CM | POA: Diagnosis present

## 2017-12-14 LAB — IRON AND TIBC
Iron: 11 ug/dL — ABNORMAL LOW (ref 28–170)
SATURATION RATIOS: 3 % — AB (ref 10.4–31.8)
TIBC: 393 ug/dL (ref 250–450)
UIBC: 382 ug/dL

## 2017-12-14 LAB — BASIC METABOLIC PANEL
ANION GAP: 10 (ref 5–15)
BUN: 15 mg/dL (ref 6–20)
CALCIUM: 8.6 mg/dL — AB (ref 8.9–10.3)
CO2: 21 mmol/L — ABNORMAL LOW (ref 22–32)
Chloride: 106 mmol/L (ref 101–111)
Creatinine, Ser: 0.94 mg/dL (ref 0.44–1.00)
GFR calc non Af Amer: >60 mL/min (ref >60)
Glucose, Bld: 83 mg/dL (ref 65–99)
POTASSIUM: 4 mmol/L (ref 3.5–5.1)
Sodium: 137 mmol/L (ref 135–145)

## 2017-12-14 LAB — RETICULOCYTES
RBC.: 2.55 MIL/uL — ABNORMAL LOW (ref 3.87–5.11)
RETIC CT PCT: 2.2 % (ref 0.4–3.1)
Retic Count, Absolute: 56.1 10*3/uL (ref 19.0–186.0)

## 2017-12-14 LAB — CBC
HEMATOCRIT: 23.3 % — AB (ref 36.0–46.0)
HEMOGLOBIN: 6.5 g/dL — AB (ref 12.0–15.0)
MCH: 18.8 pg — ABNORMAL LOW (ref 26.0–34.0)
MCHC: 27.9 g/dL — ABNORMAL LOW (ref 30.0–36.0)
MCV: 67.5 fL — ABNORMAL LOW (ref 78.0–100.0)
Platelets: 451 10*3/uL — ABNORMAL HIGH (ref 150–400)
RBC: 3.45 MIL/uL — AB (ref 3.87–5.11)
RDW: 24.1 % — ABNORMAL HIGH (ref 11.5–15.5)
WBC: 8.3 10*3/uL (ref 4.0–10.5)

## 2017-12-14 LAB — HEMOGLOBIN AND HEMATOCRIT, BLOOD
HCT: 21.2 % — ABNORMAL LOW (ref 36.0–46.0)
Hemoglobin: 6 g/dL — CL (ref 12.0–15.0)

## 2017-12-14 LAB — FOLATE: Folate: 12.7 ng/mL (ref >5.9)

## 2017-12-14 LAB — VITAMIN B12: VITAMIN B 12: 361 pg/mL (ref 180–914)

## 2017-12-14 LAB — PREPARE RBC (CROSSMATCH)

## 2017-12-14 LAB — FERRITIN: FERRITIN: 4 ng/mL — AB (ref 11–307)

## 2017-12-14 LAB — POC OCCULT BLOOD, ED: Fecal Occult Bld: NEGATIVE

## 2017-12-14 MED ORDER — ESCITALOPRAM OXALATE 10 MG PO TABS
10.0000 mg | ORAL_TABLET | Freq: Every day | ORAL | Status: DC
  Administered 2017-12-14 – 2017-12-15 (×2): 10 mg via ORAL
  Filled 2017-12-14 (×2): qty 1

## 2017-12-14 MED ORDER — SODIUM CHLORIDE 0.9 % IV SOLN
Freq: Once | INTRAVENOUS | Status: AC
  Administered 2017-12-15: 01:00:00 via INTRAVENOUS

## 2017-12-14 MED ORDER — DIPHENHYDRAMINE HCL 50 MG/ML IJ SOLN
25.0000 mg | Freq: Once | INTRAMUSCULAR | Status: AC
  Administered 2017-12-14: 25 mg via INTRAVENOUS
  Filled 2017-12-14: qty 1

## 2017-12-14 MED ORDER — FERROUS SULFATE 325 (65 FE) MG PO TABS
325.0000 mg | ORAL_TABLET | Freq: Three times a day (TID) | ORAL | Status: DC
  Administered 2017-12-14 – 2017-12-15 (×3): 325 mg via ORAL
  Filled 2017-12-14 (×3): qty 1

## 2017-12-14 MED ORDER — ONDANSETRON HCL 4 MG/2ML IJ SOLN: 4.0000 mg | Freq: Four times a day (QID) | INTRAMUSCULAR | Status: DC | PRN

## 2017-12-14 MED ORDER — ACETAMINOPHEN 650 MG RE SUPP: 650.0000 mg | Freq: Four times a day (QID) | RECTAL | Status: DC | PRN

## 2017-12-14 MED ORDER — TOPIRAMATE 25 MG PO TABS
50.0000 mg | ORAL_TABLET | Freq: Two times a day (BID) | ORAL | Status: DC
  Administered 2017-12-14 – 2017-12-15 (×3): 50 mg via ORAL
  Filled 2017-12-14 (×5): qty 2

## 2017-12-14 MED ORDER — ACETAMINOPHEN 325 MG PO TABS
650.0000 mg | ORAL_TABLET | Freq: Four times a day (QID) | ORAL | Status: DC | PRN
  Administered 2017-12-15: 650 mg via ORAL
  Filled 2017-12-14: qty 2

## 2017-12-14 MED ORDER — METOCLOPRAMIDE HCL 5 MG/ML IJ SOLN
10.0000 mg | Freq: Once | INTRAMUSCULAR | Status: AC
  Administered 2017-12-14: 10 mg via INTRAVENOUS
  Filled 2017-12-14: qty 2

## 2017-12-14 MED ORDER — ONDANSETRON HCL 4 MG PO TABS: 4.0000 mg | ORAL_TABLET | Freq: Four times a day (QID) | ORAL | Status: DC | PRN

## 2017-12-14 MED ORDER — SODIUM CHLORIDE 0.9 % IV SOLN
Freq: Once | INTRAVENOUS | Status: AC
  Administered 2017-12-14: 01:00:00 via INTRAVENOUS

## 2017-12-14 NOTE — ED Notes (Signed)
Regular diet breakfast ordered

## 2017-12-14 NOTE — ED Notes (Signed)
ED Provider at bedside. 

## 2017-12-14 NOTE — Progress Notes (Signed)
Seen examined  Patient with 5 admission for transfusion and low blood counts She has previously had meningorrhagia in the past but hasn't had periods in 1 yr No goodies, no nsaids and no other otc meds.  She is pretty micorcytic--anemia panel shows very low tsat--Priro to d/c would giv eIV iron if hemoglobin is stabilized and would refer her for OP colonoscopy as well as hemotology referral Rpt cbc a If above 8, could probably d/c home   Verneita Griffes, MD [facility] [phone]

## 2017-12-14 NOTE — ED Notes (Signed)
Attempted report 

## 2017-12-14 NOTE — ED Notes (Signed)
Ultrasound IV at bedside

## 2017-12-14 NOTE — ED Provider Notes (Signed)
[facility] EMERGENCY DEPARTMENT Provider Note  CSN: [id number] Arrival date & time: 12/13/17 2215  Chief Complaint(s) Migraine (Dizzy)  HPI Christina Mcbride is a 47 y.o. female   The history is provided by the patient.  Migraine  This is a recurrent problem. Episode onset: 3 days. The problem occurs constantly. The problem has been gradually worsening. Associated symptoms include shortness of breath. Pertinent negatives include no abdominal pain. Exacerbated by: loud noises, lights. Nothing relieves the symptoms. Treatments tried: exedrine migraine. The treatment provided moderate relief.    Past Medical History Past Medical History:  Diagnosis Date  . Anemia   . Blood transfusion without reported diagnosis   . Migraine   . PTSD (post-traumatic stress disorder)    reports daughter was murdered   . Tachycardia   . Thyroid disease   . Vitamin D deficiency    Patient Active Problem List   Diagnosis Date Noted  . Chronic daily headache 06/08/2014  . Gastroesophageal reflux disease without esophagitis 06/08/2014  . Menorrhagia with irregular cycle 06/08/2014  . Anemia, iron deficiency 03/06/2014  . Left shoulder pain 03/06/2014  . Anemia 12/27/2013  . Symptomatic anemia 12/27/2013  . Tachycardia 12/27/2013  . Bilateral leg edema 12/27/2013  . Goiter 12/27/2013  . Dehydration 12/27/2013  . Dizziness 12/27/2013  . Generalized weakness 12/27/2013  . Iron deficiency anemia 12/27/2013  . Low iron stores 12/27/2013  . Trichomoniasis 12/27/2013  . UTI (urinary tract infection) 12/27/2013  . PTSD (post-traumatic stress disorder)   . Migraine    Home Medication(s) Prior to Admission medications   Medication Sig Start Date End Date Taking? Authorizing Provider  docusate sodium (COLACE) 100 MG capsule Take 1 capsule (100 mg total) by mouth 2 (two) times daily as needed for mild constipation. Patient not taking: Reported on 02/02/2017 01/29/17   Jani Gravel, MD    escitalopram (LEXAPRO) 10 MG tablet Take 1 tablet (10 mg total) by mouth daily. After 4 weeks , may increase dose to 2 tablets ( 20 mg total) daily. 02/26/17   Alfonse Spruce, FNP  ferrous sulfate 325 (65 FE) MG tablet Take 1 tablet (325 mg total) by mouth 3 (three) times daily with meals. 02/26/17   Alfonse Spruce, FNP  naproxen (NAPROSYN) 500 MG tablet Take 1 tablet (500 mg total) by mouth 2 (two) times daily with a meal. 02/01/17   [place], Orson Ape, FNP  SUMAtriptan (IMITREX) 50 MG tablet Take 50 mg by mouth every 2 (two) hours as needed for migraine. May repeat in 2 hours if headache persists or recurs.    [provider]  topiramate (TOPAMAX) 50 MG tablet Take 50 mg by mouth 2 (two) times daily.    [provider]  Past Surgical History Past Surgical History:  Procedure Laterality Date  . TUBAL LIGATION     Family History Family History  Problem Relation Age of Onset  . Hypertension Mother   . Arthritis Mother   . Hypertension Son   . Cancer Maternal Aunt   . Diabetes Maternal Grandmother     Social History Social History   Tobacco Use  . Smoking status: Never Smoker  . Smokeless tobacco: Never Used  Substance Use Topics  . Alcohol use: No  . Drug use: No   Allergies Bee venom  Review of Systems Review of Systems  Constitutional: Positive for fatigue (severe).  Respiratory: Positive for shortness of breath.   Gastrointestinal: Negative for abdominal pain, blood in stool, nausea and vomiting.  Neurological: Positive for light-headedness.   All other systems are reviewed and are negative for acute change except as noted in the HPI  Physical Exam Vital Signs  I have reviewed the triage vital signs BP 130/72 (BP Location: Left Arm)   Pulse 98   Temp 99.5 F (37.5 C) (Oral)   Resp 15   Ht 5\' 7"  (1.702 m)   Wt  104.3 kg (230 lb)   SpO2 100%   BMI 36.02 kg/m   Physical Exam  Constitutional: She is oriented to person, place, and time. She appears well-developed and well-nourished. No distress.  HENT:  Head: Normocephalic and atraumatic.  Nose: Nose normal.  Pale lips  Eyes: EOM are normal. Pupils are equal, round, and reactive to light. Right eye exhibits no discharge. Left eye exhibits no discharge. No scleral icterus.  Pale conjunctiva  Neck: Normal range of motion. Neck supple.  Cardiovascular: Normal rate and regular rhythm. Exam reveals no gallop and no friction rub.  No murmur heard. Pulmonary/Chest: Effort normal and breath sounds normal. No stridor. No respiratory distress. She has no rales.  Abdominal: Soft. She exhibits no distension. There is no tenderness.  Musculoskeletal: She exhibits no edema or tenderness.  Neurological: She is alert and oriented to person, place, and time.  Mental Status:  Alert and oriented to person, place, and time.  Attention and concentration normal.  Speech clear.  Recent memory is intact  Cranial Nerves:  II Visual Fields: Intact to confrontation. Visual fields intact. III, IV, VI: Pupils equal and reactive to light and near. Full eye movement without nystagmus  V Facial Sensation: Normal. No weakness of masticatory muscles  VII: No facial weakness or asymmetry  VIII Auditory Acuity: Grossly normal  IX/X: The uvula is midline; the palate elevates symmetrically  XI: Normal sternocleidomastoid and trapezius strength  XII: The tongue is midline. No atrophy or fasciculations.   Motor System: Muscle Strength: 5/5 and symmetric in the upper and lower extremities. No pronation or drift.  Muscle Tone: Tone and muscle bulk are normal in the upper and lower extremities.   Reflexes: DTRs: 1+ and symmetrical in all four extremities. No Clonus Coordination: Intact finger-to-nose. No tremor.  Sensation: Intact to light touch, and pinprick.  Gait: deferred     Skin: Skin is warm and dry. No rash noted. She is not diaphoretic. No erythema. There is pallor.  Psychiatric: She has a normal mood and affect.  Vitals reviewed.   ED Results and Treatments Labs (all labs ordered are listed, but only abnormal results are displayed) Labs Reviewed  CBC WITH DIFFERENTIAL/PLATELET - Abnormal; Notable for the following components:      Result Value   RBC 2.50 (*)    Hemoglobin 4.0 (*)  HCT 15.5 (*)    MCV 62.0 (*)    MCH 16.0 (*)    MCHC 25.8 (*)    RDW 20.9 (*)    Platelets 493 (*)    All other components within normal limits  BASIC METABOLIC PANEL - Abnormal; Notable for the following components:   Glucose, Bld 106 (*)    Creatinine, Ser 1.02 (*)    Calcium 8.6 (*)    All other components within normal limits  VITAMIN B12  FOLATE  IRON AND TIBC  FERRITIN  RETICULOCYTES  POC OCCULT BLOOD, ED  TYPE AND SCREEN  PREPARE RBC (CROSSMATCH)                                                                                                                         EKG  EKG Interpretation  Date/Time:    Ventricular Rate:    PR Interval:    QRS Duration:   QT Interval:    QTC Calculation:   R Axis:     Text Interpretation:        Radiology No results found. Pertinent labs & imaging results that were available during my care of the patient were reviewed by me and considered in my medical decision making (see chart for details).  Medications Ordered in ED Medications  0.9 %  sodium chloride infusion (not administered)                                                                                                                                    Procedures Procedures CRITICAL CARE Performed by: Grayce Sessions Tonette Koehne Total critical care time: 35 minutes Critical care time was exclusive of separately billable procedures and treating other patients. Critical care was necessary to treat or prevent imminent or life-threatening  deterioration. Critical care was time spent personally by me on the following activities: development of treatment plan with patient and/or surrogate as well as nursing, discussions with consultants, evaluation of patient's response to treatment, examination of patient, obtaining history from patient or surrogate, ordering and performing treatments and interventions, ordering and review of laboratory studies, ordering and review of radiographic studies, pulse oximetry and re-evaluation of patient's condition.   (including critical care time)  Medical Decision Making / ED Course I have reviewed the nursing notes for this encounter and the patient's prior records (if available in EHR or on provided paperwork).    Typical  migraine headache for the pt. Non focal neuro exam. No recent head trauma. No fever. Doubt meningitis. Doubt intracranial bleed. Doubt IIH. No indication for imaging.  Patient declined medication for migraine, just wanted a dark room.  Given her fatigue, SOB, DOE, and exam finding concerning for anemia. Labs obtained, revealing Hb 4. Hemoccult negative.  transfusion started in the ED    Final Clinical Impression(s) / ED Diagnoses Final diagnoses:  Anemia, unspecified type  Migraine without aura and without status migrainosus, not intractable      This chart was dictated using voice recognition software.  Despite best efforts to proofread,  errors can occur which can change the documentation meaning.   Fatima Blank, MD 12/14/17 [phone]

## 2017-12-14 NOTE — H&P (Signed)
History and Physical    Christina Mcbride XNA:[id number] DOB: [birth year] DOA: 12/13/2017  PCP: Alfonse Spruce, FNP  Patient coming from: Home.  Chief Complaint: Headache dizziness.  HPI: Christina Mcbride is a 47 y.o. female with history of known anemia previously requiring transfusion presents to the ER with complaints of worsening headache typical of her migraine last few days.  Patient denies any focal deficits denies any obvious GI bleeding.  Has not had any menstrual cycle for last 1 year.  Denies any chest pain has been having some fatigue and weakness.  ED Course: The ER patient's hemoglobin is found to be around 4 and stool for occult blood was negative.  CBC which was microcytic hypochromic anemia panel is pending.  Patient admitted for symptomatic anemia and transfusion.  Review of Systems: As per HPI, rest all negative.   Past Medical History:  Diagnosis Date  . Anemia   . Blood transfusion without reported diagnosis   . Migraine   . PTSD (post-traumatic stress disorder)    reports daughter was murdered   . Tachycardia   . Thyroid disease   . Vitamin D deficiency     Past Surgical History:  Procedure Laterality Date  . TUBAL LIGATION       reports that  has never smoked. she has never used smokeless tobacco. She reports that she does not drink alcohol or use drugs.  Allergies  Allergen Reactions  . Bee Venom Anaphylaxis    Family History  Problem Relation Age of Onset  . Hypertension Mother   . Arthritis Mother   . Hypertension Son   . Cancer Maternal Aunt   . Diabetes Maternal Grandmother     Prior to Admission medications   Medication Sig Start Date End Date Taking? Authorizing Provider  docusate sodium (COLACE) 100 MG capsule Take 1 capsule (100 mg total) by mouth 2 (two) times daily as needed for mild constipation. Patient not taking: Reported on 02/02/2017 01/29/17   Jani Gravel, MD  escitalopram (LEXAPRO) 10 MG tablet Take 1 tablet (10 mg total) by  mouth daily. After 4 weeks , may increase dose to 2 tablets ( 20 mg total) daily. 02/26/17   Alfonse Spruce, FNP  ferrous sulfate 325 (65 FE) MG tablet Take 1 tablet (325 mg total) by mouth 3 (three) times daily with meals. 02/26/17   Alfonse Spruce, FNP  naproxen (NAPROSYN) 500 MG tablet Take 1 tablet (500 mg total) by mouth 2 (two) times daily with a meal. 02/01/17   [place], Orson Ape, FNP  SUMAtriptan (IMITREX) 50 MG tablet Take 50 mg by mouth every 2 (two) hours as needed for migraine. May repeat in 2 hours if headache persists or recurs.    [provider]  topiramate (TOPAMAX) 50 MG tablet Take 50 mg by mouth 2 (two) times daily.    [provider]    Physical Exam: Vitals:   12/13/17 2221 12/14/17 0057 12/14/17 0112  BP: 130/72 135/71 140/77  Pulse: 98 88 87  Resp: 15 14 13   Temp: 99.5 F (37.5 C) 98.4 F (36.9 C) 98.4 F (36.9 C)  TempSrc: Oral Oral Oral  SpO2: 100%  100%  Weight: 104.3 kg (230 lb)    Height: 5\' 7"  (1.702 m)        Constitutional: Moderately built and nourished. Vitals:   12/13/17 2221 12/14/17 0057 12/14/17 0112  BP: 130/72 135/71 140/77  Pulse: 98 88 87  Resp: 15 14 13   Temp: 99.5  F (37.5 C) 98.4 F (36.9 C) 98.4 F (36.9 C)  TempSrc: Oral Oral Oral  SpO2: 100%  100%  Weight: 104.3 kg (230 lb)    Height: 5\' 7"  (1.702 m)     Eyes: Anicteric mild pallor. ENMT: No discharge from the ears eyes nose or mouth. Neck: No mass felt.  No neck rigidity.  No JVD appreciated. Respiratory: No rhonchi or crepitations. Cardiovascular: S1-S2 heard no murmurs appreciated. Abdomen: Soft nontender bowel sounds present. Musculoskeletal: No edema.  No joint effusion. Skin: Appears pale. Neurologic: Alert awake oriented to time place and person.  Moves all extremities. Psychiatric: Appears normal.  Normal affect.   Labs on Admission: I have personally reviewed following labs and imaging studies  CBC: Recent Labs  Lab 12/13/17 2238    WBC 10.2  NEUTROABS 7.1  HGB 4.0*  HCT 15.5*  MCV 62.0*  PLT 099*   Basic Metabolic Panel: Recent Labs  Lab 12/13/17 2238  NA 139  K 4.0  CL 109  CO2 22  GLUCOSE 106*  BUN 15  CREATININE 1.02*  CALCIUM 8.6*   GFR: Estimated Creatinine Clearance: 84.7 mL/min (A) (by C-G formula based on SCr of 1.02 mg/dL (H)). Liver Function Tests: No results for input(s): AST, ALT, ALKPHOS, BILITOT, PROT, ALBUMIN in the last 168 hours. No results for input(s): LIPASE, AMYLASE in the last 168 hours. No results for input(s): AMMONIA in the last 168 hours. Coagulation Profile: No results for input(s): INR, PROTIME in the last 168 hours. Cardiac Enzymes: No results for input(s): CKTOTAL, CKMB, CKMBINDEX, TROPONINI in the last 168 hours. BNP (last 3 results) No results for input(s): PROBNP in the last 8760 hours. HbA1C: No results for input(s): HGBA1C in the last 72 hours. CBG: No results for input(s): GLUCAP in the last 168 hours. Lipid Profile: No results for input(s): CHOL, HDL, LDLCALC, TRIG, CHOLHDL, LDLDIRECT in the last 72 hours. Thyroid Function Tests: No results for input(s): TSH, T4TOTAL, FREET4, T3FREE, THYROIDAB in the last 72 hours. Anemia Panel: No results for input(s): VITAMINB12, FOLATE, FERRITIN, TIBC, IRON, RETICCTPCT in the last 72 hours. Urine analysis:    Component Value Date/Time   COLORURINE YELLOW 04/10/2016 2128   APPEARANCEUR TURBID (A) 04/10/2016 2128   LABSPEC 1.016 04/10/2016 2128   PHURINE 5.5 04/10/2016 2128   GLUCOSEU NEGATIVE 04/10/2016 2128   HGBUR LARGE (A) 04/10/2016 2128   BILIRUBINUR NEGATIVE 04/10/2016 2128   KETONESUR NEGATIVE 04/10/2016 2128   PROTEINUR 100 (A) 04/10/2016 2128   UROBILINOGEN 0.2 07/10/2015 1115   NITRITE NEGATIVE 04/10/2016 2128   LEUKOCYTESUR MODERATE (A) 04/10/2016 2128   Sepsis Labs: @LABRCNTIP (procalcitonin:4,lacticidven:4) )No results found for this or any previous visit (from the past 240 hour(s)).    Radiological Exams on Admission: No results found.  EKG -normal sinus rhythm with nonspecific ST-T changes.  Assessment/Plan Principal Problem:   Symptomatic anemia Active Problems:   Anemia   Migraine    1. Symptomatic severe anemia microcytic hypochromic picture -check anemia panel.  Patient will be receiving 2 units of PRBC.  Follow CBC after transfusion. 2. Migraine headaches probably from anemia.  I think patient headache was improved with transfusion.   DVT prophylaxis: SCDs. Code Status: Full code. Family Communication: Discussed with patient. Disposition Plan: Home. Consults called: None. Admission status: Observation.   Rise Patience MD [facility] Pager [phone].  If 7PM-7AM, please contact night-coverage [email or web address] Password [email or web address]  12/14/2017, 1:18 AM

## 2017-12-15 LAB — CBC WITH DIFFERENTIAL/PLATELET
BASOS ABS: 0.1 10*3/uL (ref 0.0–0.1)
Basophils Relative: 1 %
EOS PCT: 2 %
Eosinophils Absolute: 0.2 10*3/uL (ref 0.0–0.7)
HEMATOCRIT: 27.3 % — AB (ref 36.0–46.0)
HEMOGLOBIN: 8 g/dL — AB (ref 12.0–15.0)
LYMPHS ABS: 1.6 10*3/uL (ref 0.7–4.0)
LYMPHS PCT: 15 %
MCH: 20.3 pg — AB (ref 26.0–34.0)
MCHC: 29.3 g/dL — ABNORMAL LOW (ref 30.0–36.0)
MCV: 69.1 fL — AB (ref 78.0–100.0)
Monocytes Absolute: 0.6 10*3/uL (ref 0.1–1.0)
Monocytes Relative: 6 %
NEUTROS ABS: 7.9 10*3/uL — AB (ref 1.7–7.7)
NEUTROS PCT: 76 %
Platelets: 374 10*3/uL (ref 150–400)
RBC: 3.95 MIL/uL (ref 3.87–5.11)
RDW: 22.9 % — ABNORMAL HIGH (ref 11.5–15.5)
WBC: 10.2 10*3/uL (ref 4.0–10.5)

## 2017-12-15 MED ORDER — SODIUM CHLORIDE 0.9 % IV SOLN
510.0000 mg | Freq: Once | INTRAVENOUS | Status: AC
  Administered 2017-12-15: 510 mg via INTRAVENOUS
  Filled 2017-12-15: qty 17

## 2017-12-15 NOTE — Progress Notes (Signed)
Pt discharged to home. IV Iron infusin completed prior to dc. PIV removed, AVS reviewed. Pt left unit via wheelchair, belongings in hand. Pt to be transported home by cab.

## 2017-12-15 NOTE — Discharge Summary (Signed)
Physician Discharge Summary  Christina Mcbride YBO:[id number] DOB: [birth year] DOA: 12/13/2017  PCP: Alfonse Spruce, FNP  Admit date: 12/13/2017 Discharge date: 12/15/2017  Time spent: 25 minutes  Recommendations for Outpatient Follow-up:  1. Follow hemoglobinopathy panel-might need colonoscopy for work-up Iron def anemia  Discharge Diagnoses:  Principal Problem:   Symptomatic anemia Active Problems:   Anemia   Migraine   Discharge Condition: improved  Diet recommendation: hh  Filed Weights   12/13/17 2221 12/14/17 1759  Weight: 104.3 kg (230 lb) 104.6 kg (230 lb 9.6 oz)    History of present illness: 65 female with known 4-5 admit with iron def anemia-thought severe menses cause-however no recent menses x 1 yr. Given 2 U prbc and hemoglobin 6-->8 Pending are hemoglobinopathy panel-was given IV iron this admit Will need referral for colonoscopy if Hemoglobinopathy panel neg--has a cousin with [facility]   Discharge Exam: Vitals:   12/15/17 0533 12/15/17 0614  BP: 136/86 117/63  Pulse: 86 77  Resp: 16 14  Temp: 98.1 F (36.7 C) 98.2 F (36.8 C)  SpO2: 100% 100%    General: awake alert pleasant ambulatory in nad Cardiovascular: s1 s2 no m/r/g Respiratory: clear without added soun in nad abd soft nt nd no rebound no gaurd  Discharge Instructions   Discharge Instructions    Diet - low sodium heart healthy   Complete by:  As directed    Discharge instructions   Complete by:  As directed    You will probably need an OP colonoscopy and have your primary provider check your blood panel that we tested for abnormal blood disorders Please take the iron as scheduled-if your colonoscopy is negative you might need referral to a blood doctor   Increase activity slowly   Complete by:  As directed      Allergies as of 12/15/2017      Reactions   Bee Venom Anaphylaxis      Medication List    TAKE these medications   docusate sodium 100 MG capsule Commonly known as:   COLACE Take 1 capsule (100 mg total) by mouth 2 (two) times daily as needed for mild constipation.   escitalopram 10 MG tablet Commonly known as:  LEXAPRO Take 1 tablet (10 mg total) by mouth daily. After 4 weeks , may increase dose to 2 tablets ( 20 mg total) daily.   ferrous sulfate 325 (65 FE) MG tablet Take 1 tablet (325 mg total) by mouth 3 (three) times daily with meals.   naproxen 500 MG tablet Commonly known as:  NAPROSYN Take 1 tablet (500 mg total) by mouth 2 (two) times daily with a meal. What changed:    when to take this  reasons to take this   SUMAtriptan 50 MG tablet Commonly known as:  IMITREX Take 50 mg by mouth every 2 (two) hours as needed for migraine. May repeat in 2 hours if headache persists or recurs.   topiramate 50 MG tablet Commonly known as:  TOPAMAX Take 50 mg by mouth 2 (two) times daily.      Allergies  Allergen Reactions  . Bee Venom Anaphylaxis      The results of significant diagnostics from this hospitalization (including imaging, microbiology, ancillary and laboratory) are listed below for reference.    Significant Diagnostic Studies: No results found.  Microbiology: No results found for this or any previous visit (from the past 240 hour(s)).   Labs: Basic Metabolic Panel: Recent Labs  Lab 12/13/17 2238 12/14/17 2778  NA 139 137  K 4.0 4.0  CL 109 106  CO2 22 21*  GLUCOSE 106* 83  BUN 15 15  CREATININE 1.02* 0.94  CALCIUM 8.6* 8.6*   Liver Function Tests: No results for input(s): AST, ALT, ALKPHOS, BILITOT, PROT, ALBUMIN in the last 168 hours. No results for input(s): LIPASE, AMYLASE in the last 168 hours. No results for input(s): AMMONIA in the last 168 hours. CBC: Recent Labs  Lab 12/13/17 2238 12/14/17 0437 12/14/17 2050 12/15/17 0812  WBC 10.2 8.3  --  10.2  NEUTROABS 7.1  --   --  7.9*  HGB 4.0* 6.5* 6.0* 8.0*  HCT 15.5* 23.3* 21.2* 27.3*  MCV 62.0* 67.5*  --  69.1*  PLT 493* 451*  --  374   Cardiac  Enzymes: No results for input(s): CKTOTAL, CKMB, CKMBINDEX, TROPONINI in the last 168 hours. BNP: BNP (last 3 results) No results for input(s): BNP in the last 8760 hours.  ProBNP (last 3 results) No results for input(s): PROBNP in the last 8760 hours.  CBG: No results for input(s): GLUCAP in the last 168 hours.     Signed:  Nita Sells MD   [facility] 12/15/2017, 10:01 AM

## 2017-12-15 NOTE — Progress Notes (Signed)
CRITICAL VALUE ALERT  Critical Value:  Hgb 6.0  Date & Time Notied:  12/14/17 at 2116  Provider Notified: Bodenheimer,NP  Orders Received/Actions taken: Placed order for blood transfusion of 2 units

## 2017-12-16 LAB — BPAM RBC
BLOOD PRODUCT EXPIRATION DATE: 201901192359
BLOOD PRODUCT EXPIRATION DATE: 201902062359
Blood Product Expiration Date: 201902062359
Blood Product Expiration Date: 201902072359
ISSUE DATE / TIME: 201901180036
ISSUE DATE / TIME: 201901180243
ISSUE DATE / TIME: 201901190022
ISSUE DATE / TIME: 201901190329
Unit Type and Rh: 7300
Unit Type and Rh: 7300
Unit Type and Rh: 7300
Unit Type and Rh: 9500

## 2017-12-16 LAB — TYPE AND SCREEN
ABO/RH(D): B POS
Antibody Screen: NEGATIVE
UNIT DIVISION: 0
Unit division: 0
Unit division: 0
Unit division: 0

## 2017-12-18 LAB — HEMOGLOBINOPATHY EVALUATION
Hgb A2 Quant: 2.7 % (ref 1.8–3.2)
Hgb A: 96.8 % (ref 96.4–98.8)
Hgb C: 0 %
Hgb F Quant: 0.5 % (ref 0.0–2.0)
Hgb S Quant: 0 %
Hgb Variant: 0 %

## 2018-02-19 DIAGNOSIS — K219 Gastro-esophageal reflux disease without esophagitis: Secondary | ICD-10-CM

## 2018-05-15 ENCOUNTER — Other Ambulatory Visit: Payer: Self-pay

## 2018-05-15 ENCOUNTER — Emergency Department (HOSPITAL_COMMUNITY)
Admission: EM | Admit: 2018-05-15 | Discharge: 2018-05-16 | Disposition: A | Discharge status: Home / Self Care | Payer: Medicaid Other | Attending: Emergency Medicine | Admitting: Emergency Medicine

## 2018-05-15 DIAGNOSIS — E079 Disorder of thyroid, unspecified: Secondary | ICD-10-CM | POA: Diagnosis not present

## 2018-05-15 DIAGNOSIS — Z79899 Other long term (current) drug therapy: Secondary | ICD-10-CM | POA: Insufficient documentation

## 2018-05-15 DIAGNOSIS — R1011 Right upper quadrant pain: Secondary | ICD-10-CM | POA: Diagnosis present

## 2018-05-15 DIAGNOSIS — D649 Anemia, unspecified: Secondary | ICD-10-CM | POA: Insufficient documentation

## 2018-05-15 LAB — COMPREHENSIVE METABOLIC PANEL
ALK PHOS: 89 U/L (ref 38–126)
ALT: 49 U/L (ref 14–54)
ANION GAP: 7 (ref 5–15)
AST: 61 U/L — ABNORMAL HIGH (ref 15–41)
Albumin: 3.5 g/dL (ref 3.5–5.0)
BUN: 17 mg/dL (ref 6–20)
CO2: 25 mmol/L (ref 22–32)
CREATININE: 1.1 mg/dL — AB (ref 0.44–1.00)
Calcium: 8.9 mg/dL (ref 8.9–10.3)
Chloride: 107 mmol/L (ref 101–111)
GFR, EST NON AFRICAN AMERICAN: 59 mL/min — AB (ref >60)
Glucose, Bld: 121 mg/dL — ABNORMAL HIGH (ref 65–99)
Potassium: 4.5 mmol/L (ref 3.5–5.1)
SODIUM: 139 mmol/L (ref 135–145)
Total Bilirubin: 0.3 mg/dL (ref 0.3–1.2)
Total Protein: 8.3 g/dL — ABNORMAL HIGH (ref 6.5–8.1)

## 2018-05-15 LAB — CBC
HCT: 27 % — ABNORMAL LOW (ref 36.0–46.0)
HEMOGLOBIN: 7.2 g/dL — AB (ref 12.0–15.0)
MCH: 18.7 pg — AB (ref 26.0–34.0)
MCHC: 26.7 g/dL — ABNORMAL LOW (ref 30.0–36.0)
MCV: 69.9 fL — ABNORMAL LOW (ref 78.0–100.0)
PLATELETS: 582 10*3/uL — AB (ref 150–400)
RBC: 3.86 MIL/uL — AB (ref 3.87–5.11)
RDW: 18.8 % — ABNORMAL HIGH (ref 11.5–15.5)
WBC: 10.4 10*3/uL (ref 4.0–10.5)

## 2018-05-15 LAB — I-STAT BETA HCG BLOOD, ED (MC, WL, AP ONLY): I-stat hCG, quantitative: <5 m[IU]/mL (ref <5)

## 2018-05-15 LAB — LIPASE, BLOOD: LIPASE: 31 U/L (ref 11–51)

## 2018-05-15 MED ORDER — MORPHINE SULFATE (PF) 4 MG/ML IV SOLN
4.0000 mg | Freq: Once | INTRAVENOUS | Status: AC
  Administered 2018-05-16: 4 mg via INTRAVENOUS
  Filled 2018-05-15: qty 1

## 2018-05-15 MED ORDER — ONDANSETRON HCL 4 MG/2ML IJ SOLN
4.0000 mg | Freq: Once | INTRAMUSCULAR | Status: AC
  Administered 2018-05-16: 4 mg via INTRAVENOUS
  Filled 2018-05-15: qty 2

## 2018-05-15 MED ORDER — SODIUM CHLORIDE 0.9 % IV BOLUS
1000.0000 mL | Freq: Once | INTRAVENOUS | Status: AC
  Administered 2018-05-16: 1000 mL via INTRAVENOUS

## 2018-05-15 NOTE — ED Notes (Signed)
RN assessed and unable to obtain IV access.   IV team consulted.

## 2018-05-15 NOTE — ED Triage Notes (Signed)
Patient c/o RUQ pain that began today, also c/o dizziness. Per EMS patient has been tachycardic.

## 2018-05-15 NOTE — ED Provider Notes (Signed)
[facility] EMERGENCY DEPARTMENT Provider Note   CSN: [id number] Arrival date & time: 05/15/18  2051     History   Chief Complaint Chief Complaint  Patient presents with  . Abdominal Pain    HPI Christina Mcbride is a 47 y.o. female.  The history is provided by the patient and medical records.     47 year old female with history of chronic iron deficiency anemia anemia requiring multiple blood transfusions, migraine headaches, PTSD, vitamin D deficiency, thyroid disease, presenting to the ED with abdominal pain.  Patient states this began today when she was at her mother's house.  States sudden onset of pain in her right upper abdomen.  Has had some nausea.  Denies vomiting.  No pain like this in the past.  States the last thing she ate today was a sandwich.  Has never experienced pain like this before.  Denies any history of known gallbladder disease.  No fever or chills.  Patient is also experiencing some intermittent dizziness, mostly with standing and exertional activities.  States she is almost to the point of feeling like she is going to pass out but has not lost consciousness.  States after she sits down she usually starts feeling better.  States she has a long-standing history of iron deficiency similar but not quite as bad as when she usually needs to be admitted for transfusion.  States her usual hemoglobin is between 7-8, never usually gets over 8 even with transfusion.  Takes her iron supplements TID as directed, also eats lots of leafy greens, liver, etc.  Past Medical History:  Diagnosis Date  . Anemia   . Blood transfusion without reported diagnosis   . Migraine   . PTSD (post-traumatic stress disorder)    reports daughter was murdered   . Tachycardia   . Thyroid disease   . Vitamin D deficiency     Patient Active Problem List   Diagnosis Date Noted  . Chronic daily headache 06/08/2014  . Gastroesophageal reflux disease without esophagitis  06/08/2014  . Menorrhagia with irregular cycle 06/08/2014  . Anemia, iron deficiency 03/06/2014  . Left shoulder pain 03/06/2014  . Anemia 12/27/2013  . Symptomatic anemia 12/27/2013  . Tachycardia 12/27/2013  . Bilateral leg edema 12/27/2013  . Goiter 12/27/2013  . Dehydration 12/27/2013  . Dizziness 12/27/2013  . Generalized weakness 12/27/2013  . Iron deficiency anemia 12/27/2013  . Low iron stores 12/27/2013  . Trichomoniasis 12/27/2013  . UTI (urinary tract infection) 12/27/2013  . PTSD (post-traumatic stress disorder)   . Migraine     Past Surgical History:  Procedure Laterality Date  . TUBAL LIGATION       OB History   None      Home Medications    Prior to Admission medications   Medication Sig Start Date End Date Taking? Authorizing Provider  docusate sodium (COLACE) 100 MG capsule Take 1 capsule (100 mg total) by mouth 2 (two) times daily as needed for mild constipation. 01/29/17   Jani Gravel, MD  escitalopram (LEXAPRO) 10 MG tablet Take 1 tablet (10 mg total) by mouth daily. After 4 weeks , may increase dose to 2 tablets ( 20 mg total) daily. 02/26/17   Alfonse Spruce, FNP  ferrous sulfate 325 (65 FE) MG tablet Take 1 tablet (325 mg total) by mouth 3 (three) times daily with meals. 02/26/17   Alfonse Spruce, FNP  naproxen (NAPROSYN) 500 MG tablet Take 1 tablet (500 mg total) by mouth 2 (two)  times daily with a meal. Patient taking differently: Take 500 mg by mouth 2 (two) times daily as needed for mild pain.  02/01/17   Lysbeth Penner, FNP  SUMAtriptan (IMITREX) 50 MG tablet Take 50 mg by mouth every 2 (two) hours as needed for migraine. May repeat in 2 hours if headache persists or recurs.    [provider]  topiramate (TOPAMAX) 50 MG tablet Take 50 mg by mouth 2 (two) times daily.    [provider]    Family History Family History  Problem Relation Age of Onset  . Hypertension Mother   . Arthritis Mother   . Hypertension Son     . Cancer Maternal Aunt   . Diabetes Maternal Grandmother     Social History Social History   Tobacco Use  . Smoking status: Never Smoker  . Smokeless tobacco: Never Used  Substance Use Topics  . Alcohol use: No  . Drug use: No     Allergies   Bee venom   Review of Systems Review of Systems  Gastrointestinal: Positive for abdominal pain and nausea.  All other systems reviewed and are negative.    Physical Exam Updated Vital Signs BP 134/72   Pulse 88   Temp 98.6 F (37 C) (Oral)   Resp 20   Ht 5\' 7"  (1.702 m)   Wt 113.4 kg (250 lb)   SpO2 100%   BMI 39.16 kg/m   Physical Exam  Constitutional: She is oriented to person, place, and time. She appears well-developed and well-nourished.  HENT:  Head: Normocephalic and atraumatic.  Mouth/Throat: Oropharynx is clear and moist.  Eyes: Pupils are equal, round, and reactive to light. Conjunctivae and EOM are normal.  Neck: Normal range of motion.  Cardiovascular: Normal rate, regular rhythm and normal heart sounds.  Pulmonary/Chest: Effort normal and breath sounds normal.  Abdominal: Soft. Bowel sounds are normal. There is tenderness in the right upper quadrant and epigastric area. There is no rigidity and no guarding.  Tenderness RUQ> epigastrium; no guarding, no rebound, normal bowel sounds  Musculoskeletal: Normal range of motion.  Neurological: She is alert and oriented to person, place, and time.  Skin: Skin is warm and dry.  Psychiatric: She has a normal mood and affect.  Nursing note and vitals reviewed.    ED Treatments / Results  Labs (all labs ordered are listed, but only abnormal results are displayed) Labs Reviewed  COMPREHENSIVE METABOLIC PANEL - Abnormal; Notable for the following components:      Result Value   Glucose, Bld 121 (*)    Creatinine, Ser 1.10 (*)    Total Protein 8.3 (*)    AST 61 (*)    GFR calc non Af Amer 59 (*)    All other components within normal limits  CBC - Abnormal;  Notable for the following components:   RBC 3.86 (*)    Hemoglobin 7.2 (*)    HCT 27.0 (*)    MCV 69.9 (*)    MCH 18.7 (*)    MCHC 26.7 (*)    RDW 18.8 (*)    Platelets 582 (*)    All other components within normal limits  URINALYSIS, ROUTINE W REFLEX MICROSCOPIC - Abnormal; Notable for the following components:   APPearance HAZY (*)    Hgb urine dipstick SMALL (*)    Bilirubin Urine SMALL (*)    Bacteria, UA RARE (*)    All other components within normal limits  LIPASE, BLOOD  I-STAT  BETA HCG BLOOD, ED ([facility], [facility], [facility] ONLY)    EKG None  Radiology US Abdomen Limited Ruq  Result Date: 05/16/2018 CLINICAL DATA:  Right upper quadrant pain for 5 hours. EXAM: ULTRASOUND ABDOMEN LIMITED RIGHT UPPER QUADRANT COMPARISON:  CT abdomen and pelvis 03/01/2016 FINDINGS: Gallbladder: No gallstones or wall thickening visualized. No sonographic Murphy sign noted by sonographer. Common bile duct: Diameter: 3.4 mm, normal Liver: No focal lesion identified. Within normal limits in parenchymal echogenicity. Portal vein is patent on color Doppler imaging with normal direction of blood flow towards the liver. IMPRESSION: Normal examination.  No evidence of cholelithiasis or cholecystitis. Electronically Signed   By: Lucienne Capers M.D.   On: 05/16/2018 01:42    Procedures Procedures (including critical care time)  Medications Ordered in ED Medications  morphine 4 MG/ML injection 4 mg (4 mg Intravenous Given 05/16/18 0147)  ondansetron (ZOFRAN) injection 4 mg (4 mg Intravenous Given 05/16/18 0147)  sodium chloride 0.9 % bolus 1,000 mL (1,000 mLs Intravenous New Bag/Given 05/16/18 0146)     Initial Impression / Assessment and Plan / ED Course  I have reviewed the triage vital signs and the nursing notes.  Pertinent labs & imaging results that were available during my care of the patient were reviewed by me and considered in my medical decision making (see chart for details).  47 year old female  presenting to the ED with right upper quadrant abdominal pain.  States she is also been experiencing some dizziness and lightheadedness.  Has long-standing history of anemia requiring blood transfusion multiple times in the past.  She is afebrile and nontoxic.  She has tenderness in the right upper quadrant and epigastrium without rebound or guarding.  Bowel sounds are normal.  Screening labs overall reassuring, hemoglobin is 7.2.  Reports her baseline is between 7 and 8, rarely above 8.  Does have very mild elevation of AST but normal bilirubin.  Orthostatics appropriate, some mild increase in heart rate with standing.  Will obtain right upper quadrant ultrasound.  Given IV fluids as well as pain and nausea medication.  After medications here, patient is currently pain-free.  States she is feeling better overall.  Ultrasound without any acute findings.  She is tolerating oral fluids well at this time.  Had a discussion with patient regarding her hemoglobin as this is just above the threshold of transfusion.  States she does not feel quite to the point that she needs to be admitted for transfusion, states she feels she can continue managing as an outpatient.  Feel this is reasonable.  We will have her continue her iron supplements and iron rich diet.  Close follow-up with PCP for recheck.  Discussed plan with patient, she acknowledged understanding and agreed with plan of care.  Return precautions given for new or worsening symptoms.  Final Clinical Impressions(s) / ED Diagnoses   Final diagnoses:  RUQ pain  Anemia, unspecified type    ED Discharge Orders    None       Larene Pickett, PA-C 05/16/18 0345    Blanchie Dessert, MD 05/16/18 [phone]

## 2018-05-15 NOTE — ED Notes (Signed)
Patient reminded about urine sample needed.

## 2018-05-16 ENCOUNTER — Emergency Department (HOSPITAL_COMMUNITY): Payer: Medicaid Other

## 2018-05-16 LAB — URINALYSIS, ROUTINE W REFLEX MICROSCOPIC
GLUCOSE, UA: NEGATIVE mg/dL
Ketones, ur: NEGATIVE mg/dL
LEUKOCYTES UA: NEGATIVE
NITRITE: NEGATIVE
PROTEIN: NEGATIVE mg/dL
SPECIFIC GRAVITY, URINE: 1.027 (ref 1.005–1.030)
pH: 5 (ref 5.0–8.0)

## 2018-05-16 NOTE — Discharge Instructions (Signed)
Your hemoglobin was somewhat low at 7.2 today.   Have your primary care doctor keep a close eye on this.  Make sure to continue your iron supplements and eating iron rich diet. I would follow-up with your doctor within the next week or so. Return here for any new/acute changes.

## 2018-05-29 ENCOUNTER — Ambulatory Visit: Payer: Medicaid Other | Attending: Family Medicine | Admitting: Physician Assistant

## 2018-05-29 VITALS — BP 130/77 | HR 122 | Temp 98.6°F | Resp 18 | Ht 66.5 in | Wt 234.0 lb

## 2018-05-29 DIAGNOSIS — M25552 Pain in left hip: Secondary | ICD-10-CM | POA: Diagnosis not present

## 2018-05-29 DIAGNOSIS — D500 Iron deficiency anemia secondary to blood loss (chronic): Secondary | ICD-10-CM

## 2018-05-29 DIAGNOSIS — F4310 Post-traumatic stress disorder, unspecified: Secondary | ICD-10-CM | POA: Insufficient documentation

## 2018-05-29 DIAGNOSIS — Z9114 Patient's other noncompliance with medication regimen: Secondary | ICD-10-CM | POA: Insufficient documentation

## 2018-05-29 DIAGNOSIS — E079 Disorder of thyroid, unspecified: Secondary | ICD-10-CM | POA: Insufficient documentation

## 2018-05-29 DIAGNOSIS — Z09 Encounter for follow-up examination after completed treatment for conditions other than malignant neoplasm: Secondary | ICD-10-CM | POA: Diagnosis not present

## 2018-05-29 DIAGNOSIS — Z79899 Other long term (current) drug therapy: Secondary | ICD-10-CM | POA: Insufficient documentation

## 2018-05-29 DIAGNOSIS — R000 Tachycardia, unspecified: Secondary | ICD-10-CM | POA: Insufficient documentation

## 2018-05-29 DIAGNOSIS — Z79891 Long term (current) use of opiate analgesic: Secondary | ICD-10-CM | POA: Diagnosis not present

## 2018-05-29 DIAGNOSIS — E559 Vitamin D deficiency, unspecified: Secondary | ICD-10-CM | POA: Diagnosis not present

## 2018-05-29 DIAGNOSIS — Z91199 Patient's noncompliance with other medical treatment and regimen due to unspecified reason: Secondary | ICD-10-CM

## 2018-05-29 DIAGNOSIS — Z9119 Patient's noncompliance with other medical treatment and regimen: Secondary | ICD-10-CM

## 2018-05-29 MED ORDER — METHOCARBAMOL 500 MG PO TABS
500.0000 mg | ORAL_TABLET | Freq: Three times a day (TID) | ORAL | 0 refills | Status: DC | PRN
Start: 2018-05-29 — End: 2018-12-03

## 2018-05-29 MED ORDER — TRAMADOL HCL 50 MG PO TABS: 50.0000 mg | ORAL_TABLET | Freq: Three times a day (TID) | ORAL | 0 refills | Status: DC | PRN

## 2018-05-29 MED ORDER — FERROUS SULFATE 325 (65 FE) MG PO TABS: 325.0000 mg | ORAL_TABLET | Freq: Three times a day (TID) | ORAL | 2 refills | Status: DC

## 2018-05-29 MED FILL — FERROUS SULFATE 325 MG TAB: 325 (65 FE) | 30 days supply | Qty: 90 | Fill #0

## 2018-05-29 MED FILL — METHOCARBAMOL 500 MG TABS: 500 | 30 days supply | Qty: 90 | Fill #0

## 2018-05-29 MED FILL — traMADol HCL 50 MG TABS: 50 | 10 days supply | Qty: 30 | Fill #0

## 2018-05-29 NOTE — Progress Notes (Signed)
Patient ID: Christina Mcbride, female   DOB: [birth year], 47 y.o.   MRN: [id number]        Christina Mcbride, is a 47 y.o. female  ELF:[id number]  HEN:[id number]  DOB - March 10, 1971  Subjective:  Chief Complaint and HPI: Christina Mcbride is a 47 y.o. female here  for a follow up visit after being seen in the ED 05/15/2018(see excerpts from note below).   R buttocks pain for about 2 weeks.  Worse with walking.  No numbness or weakness.  NKI.  Pain is in S-I joint.    Out of iron tablets for over a month.  She has h/o extreme anemia with multiple transfusions and non-compliance with iron therapy.  She has been feeling weak and dizzy at times lately.  She declined transfusion at most recent ED visit and said she would restart her iron immediately but didn't.   Seen in ED 05/15/2018: 47 year old female with history of chronic iron deficiency anemia anemia requiring multiple blood transfusions, migraine headaches, PTSD, vitamin D deficiency, thyroid disease, presenting to the ED with abdominal pain.  Patient states this began today when she was at her mother's house.  States sudden onset of pain in her right upper abdomen.  Has had some nausea.  Denies vomiting.  No pain like this in the past.  States the last thing she ate today was a sandwich.  Has never experienced pain like this before.  Denies any history of known gallbladder disease.  No fever or chills.  Patient is also experiencing some intermittent dizziness, mostly with standing and exertional activities.  States she is almost to the point of feeling like she is going to pass out but has not lost consciousness.  States after she sits down she usually starts feeling better.  States she has a long-standing history of iron deficiency similar but not quite as bad as when she usually needs to be admitted for transfusion.  States her usual hemoglobin is between 7-8, never usually gets over 8 even with transfusion.  Takes her iron supplements TID as directed,  also eats lots of leafy greens, liver, etc.  From A/P: 47 year old female presenting to the ED with right upper quadrant abdominal pain.  States she is also been experiencing some dizziness and lightheadedness.  Has long-standing history of anemia requiring blood transfusion multiple times in the past.  She is afebrile and nontoxic.  She has tenderness in the right upper quadrant and epigastrium without rebound or guarding.  Bowel sounds are normal.  Screening labs overall reassuring, hemoglobin is 7.2.  Reports her baseline is between 7 and 8, rarely above 8.  Does have very mild elevation of AST but normal bilirubin.  Orthostatics appropriate, some mild increase in heart rate with standing.  Will obtain right upper quadrant ultrasound.  Given IV fluids as well as pain and nausea medication.  After medications here, patient is currently pain-free.  States she is feeling better overall.  Ultrasound without any acute findings.  She is tolerating oral fluids well at this time.  Had a discussion with patient regarding her hemoglobin as this is just above the threshold of transfusion.  States she does not feel quite to the point that she needs to be admitted for transfusion, states she feels she can continue managing as an outpatient.  Feel this is reasonable.  We will have her continue her iron supplements and iron rich diet.  Close follow-up with PCP for recheck.  Discussed plan with patient, she acknowledged understanding and  agreed with plan of care.  Return precautions given for new or worsening symptoms ED/Hospital notes reviewed.     ROS:   Constitutional:  No f/c, No night sweats, No unexplained weight loss. EENT:  No vision changes, No blurry vision, No hearing changes. No mouth, throat, or ear problems.  Respiratory: No cough, No SOB Cardiac: No CP, no palpitations GI:  No abd pain, No N/V/D. GU: No Urinary s/sx Musculoskeletal: L hip pain Neuro: + headache, some on and off ongoing   dizziness, no motor weakness.  Skin: No rash Endocrine:  No polydipsia. No polyuria.  Psych: Denies SI/HI  No problems updated.  ALLERGIES: Allergies  Allergen Reactions  . Bee Venom Anaphylaxis    PAST MEDICAL HISTORY: Past Medical History:  Diagnosis Date  . Anemia   . Blood transfusion without reported diagnosis   . Migraine   . PTSD (post-traumatic stress disorder)    reports daughter was murdered   . Tachycardia   . Thyroid disease   . Vitamin D deficiency     MEDICATIONS AT HOME: Prior to Admission medications   Medication Sig Start Date End Date Taking? Authorizing Provider  docusate sodium (COLACE) 100 MG capsule Take 1 capsule (100 mg total) by mouth 2 (two) times daily as needed for mild constipation. 01/29/17   Jani Gravel, MD  escitalopram (LEXAPRO) 10 MG tablet Take 1 tablet (10 mg total) by mouth daily. After 4 weeks , may increase dose to 2 tablets ( 20 mg total) daily. 02/26/17   Alfonse Spruce, FNP  ferrous sulfate 325 (65 FE) MG tablet Take 1 tablet (325 mg total) by mouth 3 (three) times daily with meals. 05/29/18   Argentina Donovan, PA-C  methocarbamol (ROBAXIN) 500 MG tablet Take 1 tablet (500 mg total) by mouth every 8 (eight) hours as needed for muscle spasms. 05/29/18   Argentina Donovan, PA-C  naproxen (NAPROSYN) 500 MG tablet Take 1 tablet (500 mg total) by mouth 2 (two) times daily with a meal. Patient taking differently: Take 500 mg by mouth 2 (two) times daily as needed for mild pain.  02/01/17   Lysbeth Penner, FNP  SUMAtriptan (IMITREX) 50 MG tablet Take 50 mg by mouth every 2 (two) hours as needed for migraine. May repeat in 2 hours if headache persists or recurs.    [provider]  topiramate (TOPAMAX) 50 MG tablet Take 50 mg by mouth 2 (two) times daily.    [provider]  traMADol (ULTRAM) 50 MG tablet Take 1 tablet (50 mg total) by mouth every 8 (eight) hours as needed. 05/29/18   Argentina Donovan, PA-C     Objective:    EXAM:   Vitals:   05/29/18 1359  BP: 130/77  Pulse: (!) 122  Resp: 18  Temp: 98.6 F (37 C)  TempSrc: Oral  SpO2: 99%  Weight: 234 lb (106.1 kg)  Height: 5' 6.5" (1.689 m)    General appearance : A&OX3. NAD. Non-toxic-appearing HEENT: Atraumatic and Normocephalic.  PERRLA. EOM intact.  Neck: supple, no JVD. No cervical lymphadenopathy. No thyromegaly Chest/Lungs:  Breathing-non-labored, Good air entry bilaterally, breath sounds normal without rales, rhonchi, or wheezing  CVS: S1 S2 regular, no murmurs, gallops, rubs.  Rate at 102 on exam L hip/S-I joint with TTP.  Hip ROM ~50% of normal.  Ambulates without limp.  LEDTR= and intact B.   Extremities: Bilateral Lower Ext shows no edema, both legs are warm to touch with = pulse throughout  Neurology:  CN II-XII grossly intact, Non focal.   Psych:  TP linear. J/I WNL. Normal speech. Appropriate eye contact and affect.  Skin:  No Rash  Data Review Lab Results  Component Value Date   HGBA1C 5.4 02/02/2017   HGBA1C 5.7 (H) 12/27/2013   HGBA1C 4.9 12/26/2013     Assessment & Plan   1. Left hip pain - traMADol (ULTRAM) 50 MG tablet; Take 1 tablet (50 mg total) by mouth every 8 (eight) hours as needed.  Dispense: 30 tablet; Refill: 0 - methocarbamol (ROBAXIN) 500 MG tablet; Take 1 tablet (500 mg total) by mouth every 8 (eight) hours as needed for muscle spasms.  Dispense: 90 tablet; Refill: 0 - DG HIP UNILAT WITH PELVIS 2-3 VIEWS LEFT; Future  2. Iron deficiency anemia due to chronic blood loss With tachycardia and occasionally symptomatic-MUST RESTART IRON.  Discussed the dangers of this at length.  To ED if becomes worse.  - ferrous sulfate 325 (65 FE) MG tablet; Take 1 tablet (325 mg total) by mouth 3 (three) times daily with meals.  Dispense: 90 tablet; Refill: 2 - CBC with Differential/Platelet - Iron, TIBC and Ferritin Panel  3. Encounter for examination following treatment at hospital Abdominal pain has improved.        Patient have been counseled extensively about nutrition and exercise  Return in about 6 weeks (around 07/10/2018) for assign new PCP; f/up anemia.  The patient was given clear instructions to go to ER or return to medical center if symptoms don't improve, worsen or new problems develop. The patient verbalized understanding. The patient was told to call to get lab results if they haven't heard anything in the next week.     Freeman Caldron, PA-C [facility] [place], [place]   05/29/2018, 2:29 PM

## 2018-05-29 NOTE — Patient Instructions (Signed)
Anemia Anemia is a condition in which you do not have enough red blood cells or hemoglobin. Hemoglobin is a substance in red blood cells that carries oxygen. When you do not have enough red blood cells or hemoglobin (are anemic), your body cannot get enough oxygen and your organs may not work properly. As a result, you may feel very tired or have other problems. What are the causes? Common causes of anemia include:  Excessive bleeding. Anemia can be caused by excessive bleeding inside or outside the body, including bleeding from the intestine or from periods in women.  Poor nutrition.  Long-lasting (chronic) kidney, thyroid, and liver disease.  Bone marrow disorders.  Cancer and treatments for cancer.  HIV (human immunodeficiency virus) and AIDS (acquired immunodeficiency syndrome).  Treatments for HIV and AIDS.  Spleen problems.  Blood disorders.  Infections, medicines, and autoimmune disorders that destroy red blood cells.  What are the signs or symptoms? Symptoms of this condition include:  Minor weakness.  Dizziness.  Headache.  Feeling heartbeats that are irregular or faster than normal (palpitations).  Shortness of breath, especially with exercise.  Paleness.  Cold sensitivity.  Indigestion.  Nausea.  Difficulty sleeping.  Difficulty concentrating.  Symptoms may occur suddenly or develop slowly. If your anemia is mild, you may not have symptoms. How is this diagnosed? This condition is diagnosed based on:  Blood tests.  Your medical history.  A physical exam.  Bone marrow biopsy.  Your health care provider may also check your stool (feces) for blood and may do additional testing to look for the cause of your bleeding. You may also have other tests, including:  Imaging tests, such as a CT scan or MRI.  Endoscopy.  Colonoscopy.  How is this treated? Treatment for this condition depends on the cause. If you continue to lose a lot of blood,  you may need to be treated at a hospital. Treatment may include:  Taking supplements of iron, vitamin B12, or folic acid.  Taking a hormone medicine (erythropoietin) that can help to stimulate red blood cell growth.  Having a blood transfusion. This may be needed if you lose a lot of blood.  Making changes to your diet.  Having surgery to remove your spleen.  Follow these instructions at home:  Take over-the-counter and prescription medicines only as told by your health care provider.  Take supplements only as told by your health care provider.  Follow any diet instructions that you were given.  Keep all follow-up visits as told by your health care provider. This is important. Contact a health care provider if:  You develop new bleeding anywhere in the body. Get help right away if:  You are very weak.  You are short of breath.  You have pain in your abdomen or chest.  You are dizzy or feel faint.  You have trouble concentrating.  You have bloody or black, tarry stools.  You vomit repeatedly or you vomit up blood. Summary  Anemia is a condition in which you do not have enough red blood cells or enough of a substance in your red blood cells that carries oxygen (hemoglobin).  Symptoms may occur suddenly or develop slowly.  If your anemia is mild, you may not have symptoms.  This condition is diagnosed with blood tests as well as a medical history and physical exam. Other tests may be needed.  Treatment for this condition depends on the cause of the anemia. This information is not intended to replace advice   given to you by your health care provider. Make sure you discuss any questions you have with your health care provider. Document Released: 12/21/2004 Document Revised: 12/15/2016 Document Reviewed: 12/15/2016 Elsevier Interactive Patient Education  [employer].

## 2018-05-31 LAB — CBC WITH DIFFERENTIAL/PLATELET
BASOS ABS: 0 10*3/uL (ref 0.0–0.2)
Basos: 0 %
EOS (ABSOLUTE): 0.1 10*3/uL (ref 0.0–0.4)
Eos: 1 %
Hematocrit: 27.3 % — ABNORMAL LOW (ref 34.0–46.6)
Hemoglobin: 7.9 g/dL — ABNORMAL LOW (ref 11.1–15.9)
Immature Grans (Abs): 0 10*3/uL (ref 0.0–0.1)
Immature Granulocytes: 0 %
LYMPHS ABS: 1.7 10*3/uL (ref 0.7–3.1)
Lymphs: 18 %
MCH: 18.4 pg — ABNORMAL LOW (ref 26.6–33.0)
MCHC: 28.9 g/dL — AB (ref 31.5–35.7)
MCV: 64 fL — ABNORMAL LOW (ref 79–97)
MONOS ABS: 0.6 10*3/uL (ref 0.1–0.9)
Monocytes: 6 %
Neutrophils Absolute: 7 10*3/uL (ref 1.4–7.0)
Neutrophils: 75 %
PLATELETS: 494 10*3/uL — AB (ref 150–450)
RBC: 4.3 x10E6/uL (ref 3.77–5.28)
RDW: 19.1 % — AB (ref 12.3–15.4)
WBC: 9.5 10*3/uL (ref 3.4–10.8)

## 2018-05-31 LAB — IRON,TIBC AND FERRITIN PANEL
Iron Saturation: 6 % — CL (ref 15–55)
Iron: 21 ug/dL — ABNORMAL LOW (ref 27–159)
TIBC: 337 ug/dL (ref 250–450)
UIBC: 316 ug/dL (ref 131–425)

## 2018-06-03 ENCOUNTER — Telehealth: Payer: Self-pay | Admitting: *Deleted

## 2018-06-03 NOTE — Telephone Encounter (Signed)
Patient verified DOB Patient is aware of hemoglobin improving and needing to drink 80-100 oz of water with iron supplements daily. A recheck will be completed a the next visit. No further questions.

## 2018-06-03 NOTE — Telephone Encounter (Signed)
-----   Message from Argentina Donovan, [place] sent at 06/02/2018  1:20 PM EDT ----- Your hemoglobin is a little better.  Make sure you are drinking 80-100 ounces of water daily and taking iron 3 times daily as directed.  Lets recheck this at your next follow-up. Thanks, Freeman Caldron, PA-C

## 2018-07-20 IMAGING — US US ABDOMEN LIMITED
1 series · 14 of 25 positions shown · non-contrast
Comparison: CT abdomen and pelvis 03/01/2016

CLINICAL DATA: Right upper quadrant pain for 5 hours.

EXAM:
ULTRASOUND ABDOMEN LIMITED RIGHT UPPER QUADRANT

[Series 1: us abdomen limited · 0.22mm/px · 14 of 49 slices shown]
[im 1/49]
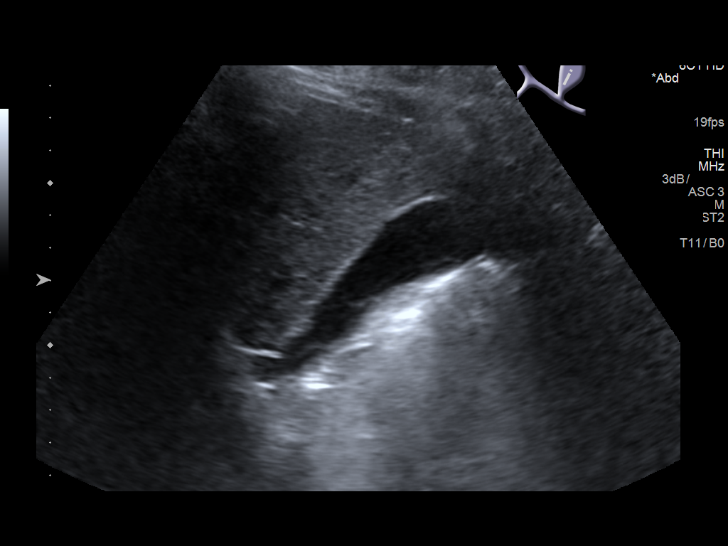
[im 5/49]
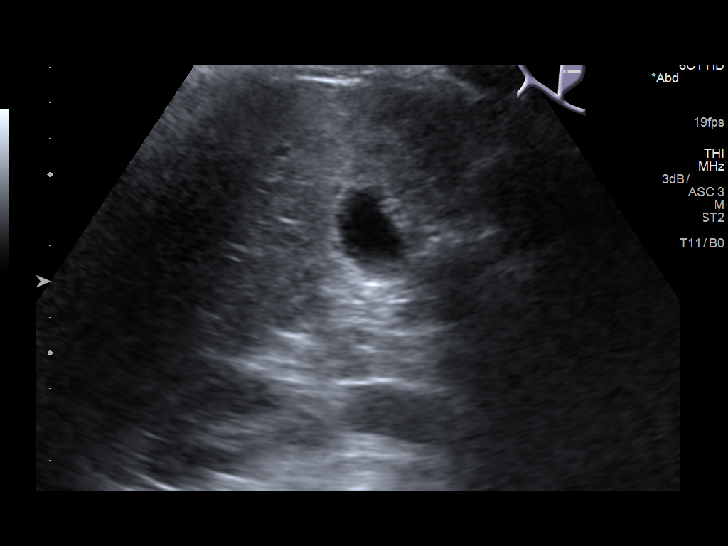
[im 9/49]
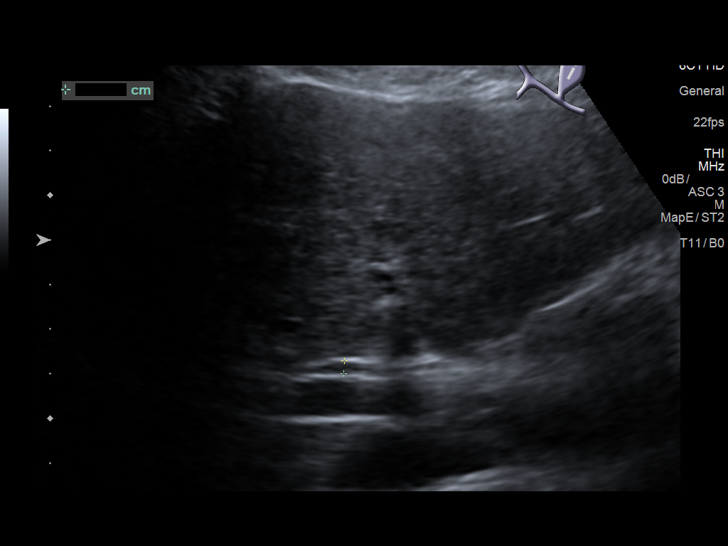
[im 13/49]
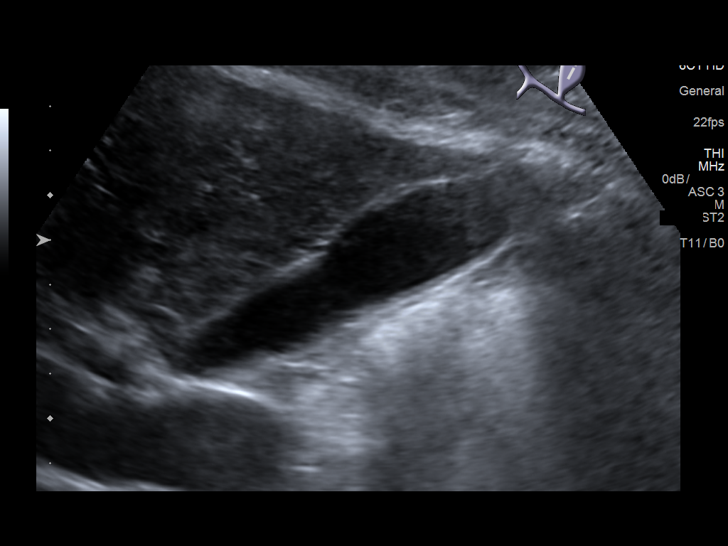
[im 17/49]
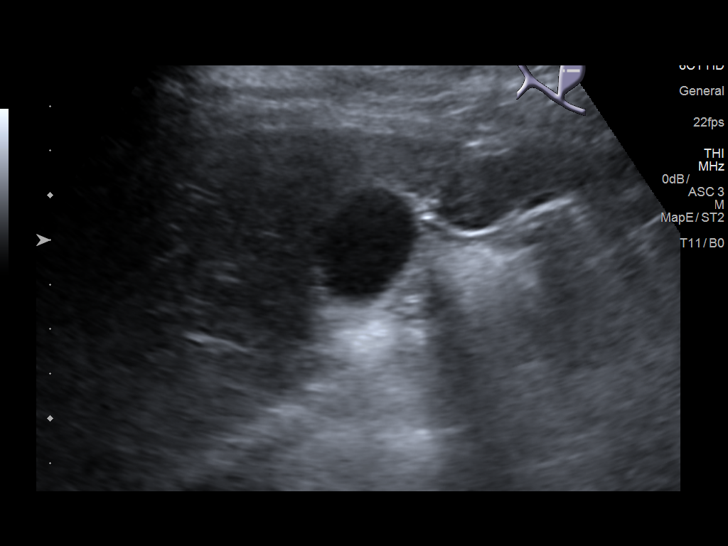
[im 19/49]
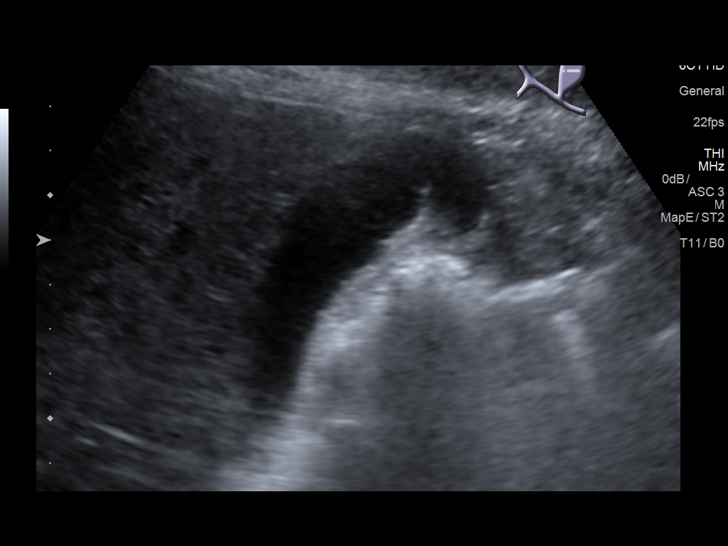
[im 23/49]
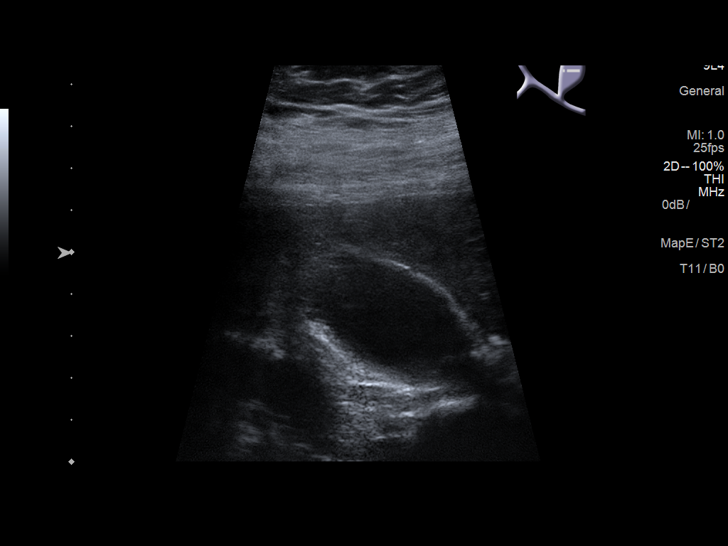
[im 27/49]
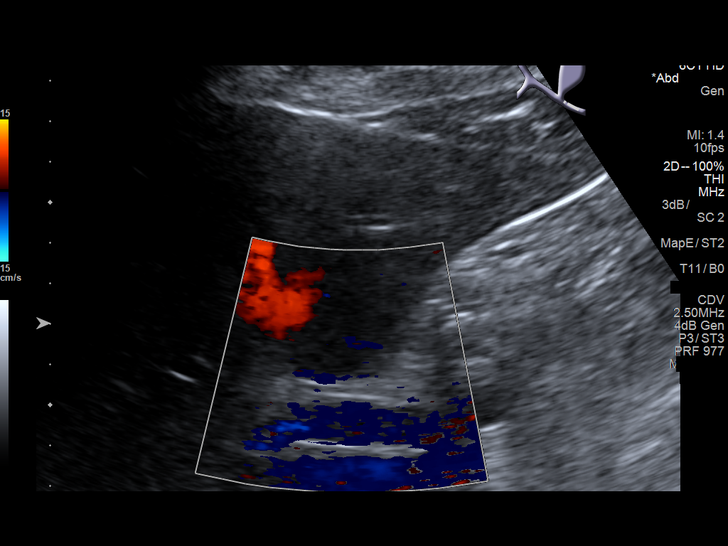
[im 31/49]
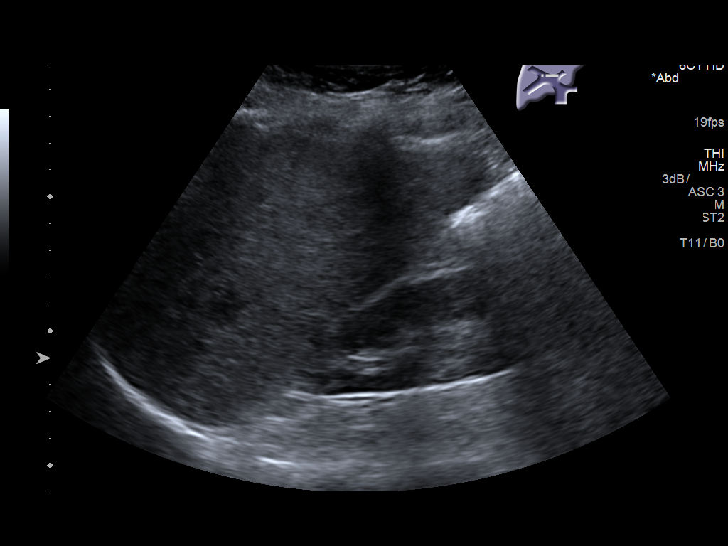
[im 33/49]
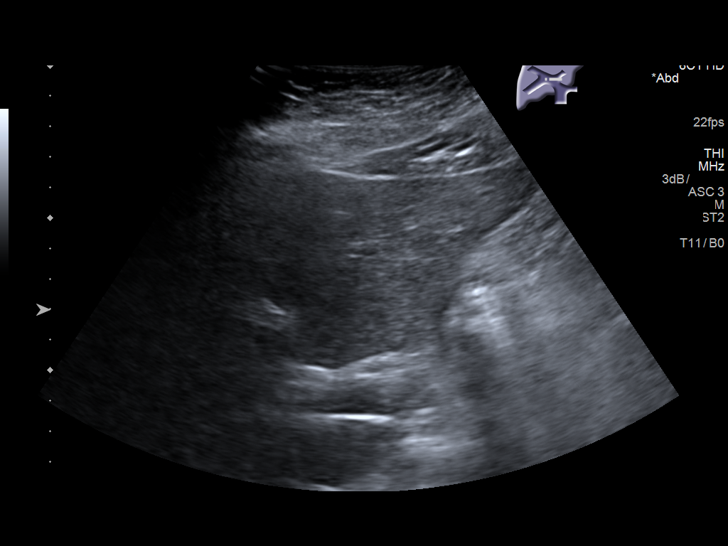
[im 37/49]
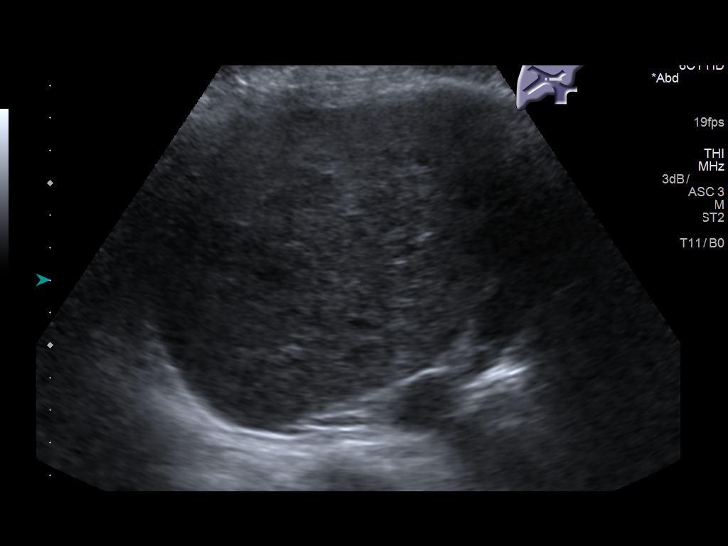
[im 41/49]
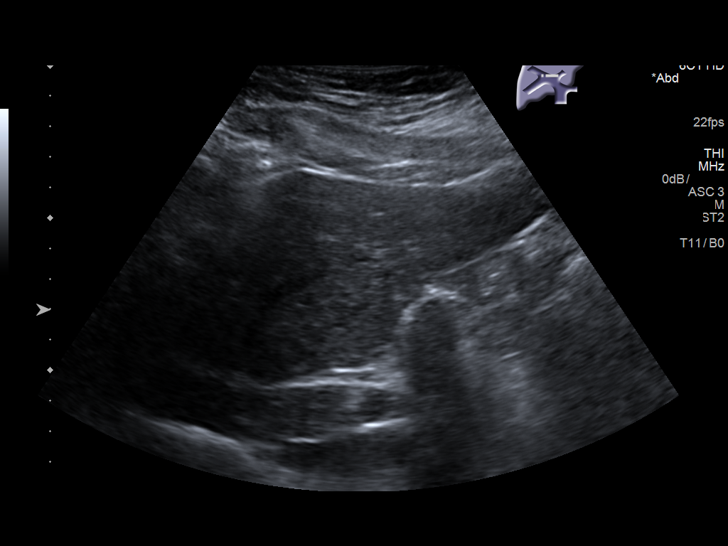
[im 45/49]
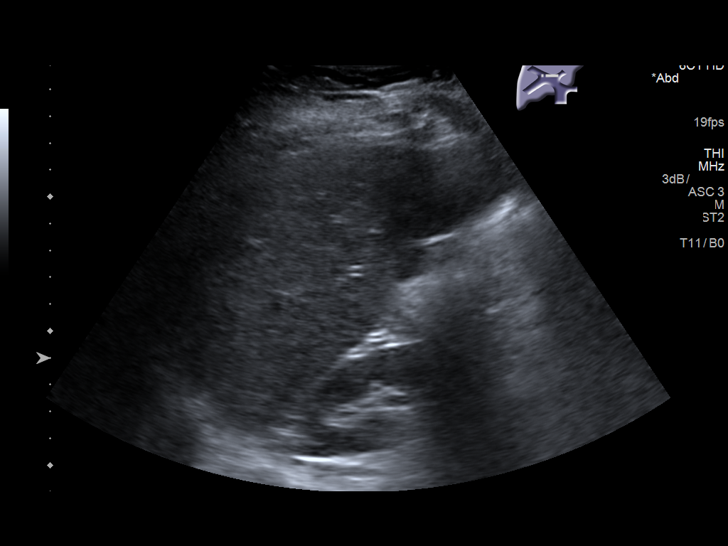
[im 49/49]
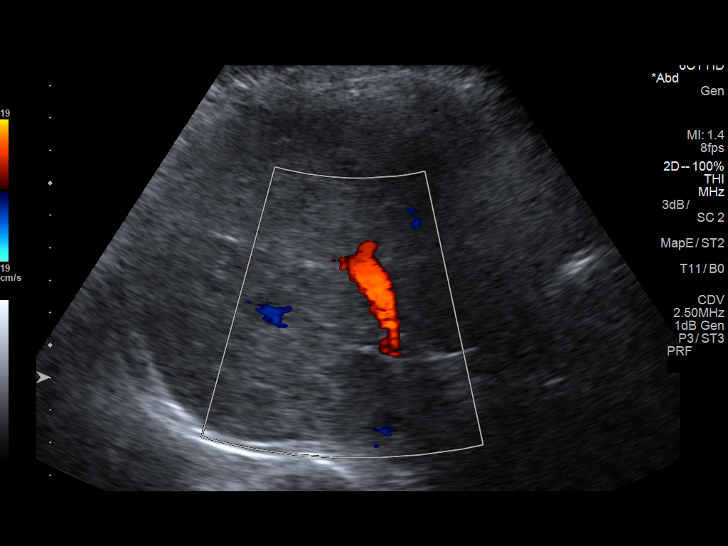

[14 of 25 positions shown; findings below may reference images not displayed]

FINDINGS: Gallbladder:

No gallstones or wall thickening visualized. No sonographic Murphy
sign noted by sonographer.

Common bile duct:

Diameter: 3.4 mm, normal

Liver:

No focal lesion identified. Within normal limits in parenchymal
echogenicity. Portal vein is patent on color Doppler imaging with
normal direction of blood flow towards the liver.
IMPRESSION: Normal examination.  No evidence of cholelithiasis or cholecystitis.

## 2018-11-27 DIAGNOSIS — N912 Amenorrhea, unspecified: Secondary | ICD-10-CM

## 2018-11-27 HISTORY — DX: Amenorrhea, unspecified: N91.2

## 2018-11-28 ENCOUNTER — Inpatient Hospital Stay (HOSPITAL_COMMUNITY)
Admission: EM | Admit: 2018-11-28 | Discharge: 2018-12-03 | DRG: 312 | Disposition: A | Discharge status: Home / Self Care | Payer: Medicaid Other | Attending: Internal Medicine | Admitting: Internal Medicine

## 2018-11-28 ENCOUNTER — Emergency Department (HOSPITAL_COMMUNITY): Payer: Medicaid Other

## 2018-11-28 ENCOUNTER — Other Ambulatory Visit: Payer: Self-pay

## 2018-11-28 DIAGNOSIS — D649 Anemia, unspecified: Secondary | ICD-10-CM | POA: Diagnosis present

## 2018-11-28 DIAGNOSIS — D500 Iron deficiency anemia secondary to blood loss (chronic): Secondary | ICD-10-CM

## 2018-11-28 DIAGNOSIS — M25552 Pain in left hip: Secondary | ICD-10-CM

## 2018-11-28 DIAGNOSIS — Z7982 Long term (current) use of aspirin: Secondary | ICD-10-CM

## 2018-11-28 DIAGNOSIS — E559 Vitamin D deficiency, unspecified: Secondary | ICD-10-CM | POA: Diagnosis present

## 2018-11-28 DIAGNOSIS — K219 Gastro-esophageal reflux disease without esophagitis: Secondary | ICD-10-CM | POA: Diagnosis present

## 2018-11-28 DIAGNOSIS — R0602 Shortness of breath: Secondary | ICD-10-CM

## 2018-11-28 DIAGNOSIS — I951 Orthostatic hypotension: Secondary | ICD-10-CM | POA: Diagnosis present

## 2018-11-28 DIAGNOSIS — E860 Dehydration: Secondary | ICD-10-CM | POA: Diagnosis present

## 2018-11-28 DIAGNOSIS — Z8249 Family history of ischemic heart disease and other diseases of the circulatory system: Secondary | ICD-10-CM

## 2018-11-28 DIAGNOSIS — G8929 Other chronic pain: Secondary | ICD-10-CM | POA: Diagnosis present

## 2018-11-28 DIAGNOSIS — Z91030 Bee allergy status: Secondary | ICD-10-CM

## 2018-11-28 DIAGNOSIS — F4310 Post-traumatic stress disorder, unspecified: Secondary | ICD-10-CM | POA: Diagnosis present

## 2018-11-28 DIAGNOSIS — Z9851 Tubal ligation status: Secondary | ICD-10-CM

## 2018-11-28 DIAGNOSIS — Z8261 Family history of arthritis: Secondary | ICD-10-CM

## 2018-11-28 DIAGNOSIS — R42 Dizziness and giddiness: Secondary | ICD-10-CM

## 2018-11-28 DIAGNOSIS — D509 Iron deficiency anemia, unspecified: Secondary | ICD-10-CM

## 2018-11-28 DIAGNOSIS — Z79891 Long term (current) use of opiate analgesic: Secondary | ICD-10-CM

## 2018-11-28 DIAGNOSIS — Z833 Family history of diabetes mellitus: Secondary | ICD-10-CM

## 2018-11-28 DIAGNOSIS — Z79899 Other long term (current) drug therapy: Secondary | ICD-10-CM

## 2018-11-28 HISTORY — DX: Amenorrhea, unspecified: N91.2

## 2018-11-28 LAB — BASIC METABOLIC PANEL
ANION GAP: 8 (ref 5–15)
BUN: 7 mg/dL (ref 6–20)
CO2: 24 mmol/L (ref 22–32)
Calcium: 8.7 mg/dL — ABNORMAL LOW (ref 8.9–10.3)
Chloride: 109 mmol/L (ref 98–111)
Creatinine, Ser: 1.07 mg/dL — ABNORMAL HIGH (ref 0.44–1.00)
GFR calc non Af Amer: >60 mL/min (ref >60)
Glucose, Bld: 77 mg/dL (ref 70–99)
POTASSIUM: 3.9 mmol/L (ref 3.5–5.1)
Sodium: 141 mmol/L (ref 135–145)

## 2018-11-28 LAB — CBC WITH DIFFERENTIAL/PLATELET
Abs Immature Granulocytes: 0.04 10*3/uL (ref 0.00–0.07)
BASOS ABS: 0 10*3/uL (ref 0.0–0.1)
Basophils Relative: 0 %
Eosinophils Absolute: 0.2 10*3/uL (ref 0.0–0.5)
Eosinophils Relative: 3 %
HCT: 28.8 % — ABNORMAL LOW (ref 36.0–46.0)
Hemoglobin: 7.5 g/dL — ABNORMAL LOW (ref 12.0–15.0)
IMMATURE GRANULOCYTES: 0 %
LYMPHS ABS: 1.9 10*3/uL (ref 0.7–4.0)
LYMPHS PCT: 21 %
MCH: 17.9 pg — ABNORMAL LOW (ref 26.0–34.0)
MCHC: 26 g/dL — ABNORMAL LOW (ref 30.0–36.0)
MCV: 68.7 fL — ABNORMAL LOW (ref 80.0–100.0)
Monocytes Absolute: 0.9 10*3/uL (ref 0.1–1.0)
Monocytes Relative: 10 %
NEUTROS ABS: 6 10*3/uL (ref 1.7–7.7)
NEUTROS PCT: 66 %
NRBC: 0.2 % (ref 0.0–0.2)
PLATELETS: 538 10*3/uL — AB (ref 150–400)
RBC: 4.19 MIL/uL (ref 3.87–5.11)
RDW: 19.9 % — AB (ref 11.5–15.5)
WBC: 9 10*3/uL (ref 4.0–10.5)

## 2018-11-28 LAB — I-STAT CHEM 8, ED
BUN: 8 mg/dL (ref 6–20)
Calcium, Ion: 1.14 mmol/L — ABNORMAL LOW (ref 1.15–1.40)
Chloride: 108 mmol/L (ref 98–111)
Creatinine, Ser: 1 mg/dL (ref 0.44–1.00)
Glucose, Bld: 80 mg/dL (ref 70–99)
HCT: 25 % — ABNORMAL LOW (ref 36.0–46.0)
Hemoglobin: 8.5 g/dL — ABNORMAL LOW (ref 12.0–15.0)
Potassium: 3.9 mmol/L (ref 3.5–5.1)
SODIUM: 142 mmol/L (ref 135–145)
TCO2: 25 mmol/L (ref 22–32)

## 2018-11-28 LAB — I-STAT TROPONIN, ED: TROPONIN I, POC: 0.01 ng/mL (ref 0.00–0.08)

## 2018-11-28 MED ORDER — LACTATED RINGERS IV BOLUS
1000.0000 mL | Freq: Once | INTRAVENOUS | Status: AC
  Administered 2018-11-29: 1000 mL via INTRAVENOUS

## 2018-11-28 MED ORDER — LACTATED RINGERS IV BOLUS
1000.0000 mL | Freq: Once | INTRAVENOUS | Status: AC
  Administered 2018-11-28: 1000 mL via INTRAVENOUS

## 2018-11-28 NOTE — ED Triage Notes (Signed)
Patient presents to the ED via EMS with C/O shortness of breath, dizziness.  Patient has a hx of anemia that requires transfusions. HR - 120, BP - 160/90,  CBG - 111. SaO2 100% on room  air

## 2018-11-28 NOTE — ED Provider Notes (Signed)
[facility] EMERGENCY DEPARTMENT Provider Note   CSN: [id number] Arrival date & time: 11/28/18  2128     History   Chief Complaint Chief Complaint  Patient presents with  . Shortness of Breath  . Dizziness    HPI Christina Mcbride is a 48 y.o. female.  HPI   Patient is a 48 year old female with a past medical history of iron deficiency anemia requiring blood transfusions, migraine, PTSD, and thyroid disease who presents for evaluation of 2 days of shortness of breath and lightheadedness.  Patient states this feels very similar to when she needed blood transfusions in the past.  She denies any fevers, chills, cough, chest pain, vomiting, diarrhea, dysuria, abdominal pain, blood in her stool, blood in her urine, extremity pain, rash, other acute complaints.  Past Medical History:  Diagnosis Date  . Anemia   . Blood transfusion without reported diagnosis   . Migraine   . PTSD (post-traumatic stress disorder)    reports daughter was murdered   . Tachycardia   . Thyroid disease   . Vitamin D deficiency     Patient Active Problem List   Diagnosis Date Noted  . Chronic daily headache 06/08/2014  . Gastroesophageal reflux disease without esophagitis 06/08/2014  . Menorrhagia with irregular cycle 06/08/2014  . Anemia, iron deficiency 03/06/2014  . Left shoulder pain 03/06/2014  . Anemia 12/27/2013  . Symptomatic anemia 12/27/2013  . Tachycardia 12/27/2013  . Bilateral leg edema 12/27/2013  . Goiter 12/27/2013  . Dehydration 12/27/2013  . Dizziness 12/27/2013  . Generalized weakness 12/27/2013  . Iron deficiency anemia 12/27/2013  . Low iron stores 12/27/2013  . Trichomoniasis 12/27/2013  . UTI (urinary tract infection) 12/27/2013  . PTSD (post-traumatic stress disorder)   . Migraine     Past Surgical History:  Procedure Laterality Date  . TUBAL LIGATION       OB History   No obstetric history on file.      Home Medications    Prior to  Admission medications   Medication Sig Start Date End Date Taking? Authorizing Provider  aspirin-acetaminophen-caffeine (EXCEDRIN MIGRAINE) [phone] MG tablet Take 1-2 tablets by mouth every 6 (six) hours as needed for headache or migraine.   Yes [provider]  CALCIUM PO Take 500 mg by mouth daily.   Yes [provider]  docusate sodium (COLACE) 100 MG capsule Take 1 capsule (100 mg total) by mouth 2 (two) times daily as needed for mild constipation. 01/29/17  Yes Jani Gravel, MD  escitalopram (LEXAPRO) 10 MG tablet Take 1 tablet (10 mg total) by mouth daily. After 4 weeks , may increase dose to 2 tablets ( 20 mg total) daily. Patient taking differently: Take 20 mg by mouth daily.  02/26/17  Yes Hairston, Toy Baker R, FNP  ferrous sulfate 325 (65 FE) MG tablet Take 1 tablet (325 mg total) by mouth 3 (three) times daily with meals. 05/29/18  Yes McClung, Dionne Bucy, PA-C  traMADol (ULTRAM) 50 MG tablet Take 1 tablet (50 mg total) by mouth every 8 (eight) hours as needed. Patient taking differently: Take 50 mg by mouth every 8 (eight) hours as needed (for pain).  05/29/18  Yes McClung, Dionne Bucy, PA-C  methocarbamol (ROBAXIN) 500 MG tablet Take 1 tablet (500 mg total) by mouth every 8 (eight) hours as needed for muscle spasms. Patient not taking: Reported on 11/28/2018 05/29/18   Argentina Donovan, PA-C  naproxen (NAPROSYN) 500 MG tablet Take 1 tablet (500 mg total) by  mouth 2 (two) times daily with a meal. Patient not taking: Reported on 11/28/2018 02/01/17   Lysbeth Penner, FNP  SUMAtriptan (IMITREX) 50 MG tablet Take 50 mg by mouth every 2 (two) hours as needed for migraine. May repeat in 2 hours if headache persists or recurs.    [provider]  topiramate (TOPAMAX) 50 MG tablet Take 50 mg by mouth 2 (two) times daily.    [provider]    Family History Family History  Problem Relation Age of Onset  . Hypertension Mother   . Arthritis Mother   . Hypertension Son     . Cancer Maternal Aunt   . Diabetes Maternal Grandmother     Social History Social History   Tobacco Use  . Smoking status: Never Smoker  . Smokeless tobacco: Never Used  Substance Use Topics  . Alcohol use: No  . Drug use: No     Allergies   Bee venom   Review of Systems Review of Systems  Constitutional: Negative for chills and fever.  HENT: Negative for ear pain and sore throat.   Eyes: Negative for pain and visual disturbance.  Respiratory: Positive for shortness of breath. Negative for cough.   Cardiovascular: Negative for chest pain and palpitations.  Gastrointestinal: Negative for abdominal pain and vomiting.  Genitourinary: Negative for dysuria and hematuria.  Musculoskeletal: Negative for arthralgias and back pain.  Skin: Negative for color change and rash.  Neurological: Positive for light-headedness and headaches. Negative for seizures and syncope.  All other systems reviewed and are negative.    Physical Exam Updated Vital Signs BP 113/77   Pulse 94   Temp 98.9 F (37.2 C) (Oral)   Resp 13   LMP 05/04/2016   SpO2 98%   Physical Exam Vitals signs and nursing note reviewed.  Constitutional:      General: She is not in acute distress.    Appearance: She is well-developed.  HENT:     Head: Normocephalic and atraumatic.     Mouth/Throat:     Mouth: Mucous membranes are moist.  Eyes:     Extraocular Movements: Extraocular movements intact.     Conjunctiva/sclera: Conjunctivae normal.     Pupils: Pupils are equal, round, and reactive to light.  Neck:     Musculoskeletal: Neck supple.  Cardiovascular:     Rate and Rhythm: Regular rhythm. Tachycardia present.     Pulses: Normal pulses.     Heart sounds: No murmur.  Pulmonary:     Effort: Pulmonary effort is normal. No respiratory distress.     Breath sounds: Normal breath sounds.  Abdominal:     Palpations: Abdomen is soft.     Tenderness: There is no abdominal tenderness.  Skin:     General: Skin is warm and dry.     Capillary Refill: Capillary refill takes 2 to 3 seconds.  Neurological:     General: No focal deficit present.     Mental Status: She is alert.      ED Treatments / Results  Labs (all labs ordered are listed, but only abnormal results are displayed) Labs Reviewed  BASIC METABOLIC PANEL - Abnormal; Notable for the following components:      Result Value   Creatinine, Ser 1.07 (*)    Calcium 8.7 (*)    All other components within normal limits  CBC WITH DIFFERENTIAL/PLATELET - Abnormal; Notable for the following components:   Hemoglobin 7.5 (*)    HCT 28.8 (*)  MCV 68.7 (*)    MCH 17.9 (*)    MCHC 26.0 (*)    RDW 19.9 (*)    Platelets 538 (*)    All other components within normal limits  I-STAT CHEM 8, ED - Abnormal; Notable for the following components:   Calcium, Ion 1.14 (*)    Hemoglobin 8.5 (*)    HCT 25.0 (*)    All other components within normal limits  BRAIN NATRIURETIC PEPTIDE  D-DIMER, QUANTITATIVE (NOT AT [facility])  TSH  I-STAT TROPONIN, ED  TYPE AND SCREEN    EKG EKG Interpretation  Date/Time:  Thursday November 28 2018 21:33:16 EST Ventricular Rate:  110 PR Interval:    QRS Duration: 82 QT Interval:  334 QTC Calculation: 452 R Axis:   20 Text Interpretation:  Sinus tachycardia Borderline T abnormalities, diffuse leads Confirmed by Sherwood Gambler [phone]) on 11/28/2018 9:43:20 PM   Radiology Dg Chest Port 1 View  Result Date: 11/28/2018 CLINICAL DATA:  Shortness of breath for 3 days. EXAM: PORTABLE CHEST 1 VIEW COMPARISON:  Lung bases from abdominal CT 03/01/2016 FINDINGS: Heart is normal in size. Rounded retrocardiac density represents hiatal hernia is seen on prior abdominal CT. No pulmonary edema, confluent airspace disease, pleural effusion or pneumothorax. IMPRESSION: 1. No acute chest findings. 2. Hiatal hernia. Electronically Signed   By: Keith Rake M.D.   On: 11/28/2018 22:53    Procedures Procedures  (including critical care time)  Medications Ordered in ED Medications  lactated ringers bolus 1,000 mL (0 mLs Intravenous Stopped 11/29/18 0031)  lactated ringers bolus 1,000 mL (1,000 mLs Intravenous New Bag/Given 11/29/18 0031)     Initial Impression / Assessment and Plan / ED Course  I have reviewed the triage vital signs and the nursing notes.  Pertinent labs & imaging results that were available during my care of the patient were reviewed by me and considered in my medical decision making (see chart for details).     Patient is a 48 year old female who presents with above-stated history exam for evaluation of shortness of breath, lightheadedness, and headache.  On presentation patient is noted to be tachycardic to the 1 teens with otherwise stable vital signs.  Exam as above remarkable lungs clear to auscultation bilaterally, abdomen soft nontender, no focal neurological deficits.  Patient is noted to have intact extremity pulses.  I have low suspicion for ACS given patient denies any chest pain and ECG shows sinus tachycardia with a ventricular rate of 110, normal intervals, no signs of acute ischemic change.  In addition initial troponin is 0.01.  Chest x-ray does not show pneumonia, pneumothorax, pulmonary edema, pleural effusion, or other acute intrathoracic abnormality within limits of the study.  While CBC does show a hemoglobin of 7.5 this does appear to be close to patient's baseline so a dimer was obtained to assess for the likelihood of PE.  BMP shows no significant metabolic abnormalities.  Patient given IVF in the ED and HR was noted to decrease into the 90s.   Plan of care was discussed with Dr. Roxanne Mins and care assumed by Dr. Roxanne Mins att approximately 2330. Plan is to f/u dimer and obtain CTA if pos. Plan is to have patient ambulate and assess response to IVF if dimer is negative.   Final Clinical Impressions(s) / ED Diagnoses   Final diagnoses:  Lightheaded  Iron deficiency  anemia, unspecified iron deficiency anemia type    ED Discharge Orders    None  Hulan Saas, MD 11/29/18 Kerrin Champagne    Sherwood Gambler, MD 11/29/18 [phone]

## 2018-11-29 ENCOUNTER — Encounter (HOSPITAL_COMMUNITY): Payer: Self-pay | Admitting: Internal Medicine

## 2018-11-29 DIAGNOSIS — I951 Orthostatic hypotension: Secondary | ICD-10-CM | POA: Diagnosis present

## 2018-11-29 DIAGNOSIS — D649 Anemia, unspecified: Secondary | ICD-10-CM | POA: Diagnosis not present

## 2018-11-29 LAB — URINALYSIS, ROUTINE W REFLEX MICROSCOPIC
Bilirubin Urine: NEGATIVE
GLUCOSE, UA: NEGATIVE mg/dL
Ketones, ur: NEGATIVE mg/dL
Leukocytes, UA: NEGATIVE
Nitrite: NEGATIVE
Protein, ur: NEGATIVE mg/dL
Specific Gravity, Urine: 1.025 (ref 1.005–1.030)
pH: 5 (ref 5.0–8.0)

## 2018-11-29 LAB — RETICULOCYTES
Immature Retic Fract: 34.7 % — ABNORMAL HIGH (ref 2.3–15.9)
RBC.: 4.68 MIL/uL (ref 3.87–5.11)
Retic Count, Absolute: 32.3 10*3/uL (ref 19.0–186.0)
Retic Ct Pct: 0.7 % (ref 0.4–3.1)

## 2018-11-29 LAB — IRON AND TIBC
Iron: 27 ug/dL — ABNORMAL LOW (ref 28–170)
Saturation Ratios: 8 % — ABNORMAL LOW (ref 10.4–31.8)
TIBC: 336 ug/dL (ref 250–450)
UIBC: 309 ug/dL

## 2018-11-29 LAB — CBC
HCT: 33 % — ABNORMAL LOW (ref 36.0–46.0)
Hemoglobin: 9.3 g/dL — ABNORMAL LOW (ref 12.0–15.0)
MCH: 19.9 pg — ABNORMAL LOW (ref 26.0–34.0)
MCHC: 28.2 g/dL — ABNORMAL LOW (ref 30.0–36.0)
MCV: 70.5 fL — ABNORMAL LOW (ref 80.0–100.0)
PLATELETS: 486 10*3/uL — AB (ref 150–400)
RBC: 4.68 MIL/uL (ref 3.87–5.11)
RDW: 21.5 % — ABNORMAL HIGH (ref 11.5–15.5)
WBC: 9.2 10*3/uL (ref 4.0–10.5)
nRBC: 0 % (ref 0.0–0.2)

## 2018-11-29 LAB — BASIC METABOLIC PANEL
Anion gap: 6 (ref 5–15)
BUN: 14 mg/dL (ref 6–20)
CO2: 24 mmol/L (ref 22–32)
Calcium: 8.5 mg/dL — ABNORMAL LOW (ref 8.9–10.3)
Chloride: 109 mmol/L (ref 98–111)
Creatinine, Ser: 0.85 mg/dL (ref 0.44–1.00)
GFR calc Af Amer: >60 mL/min (ref >60)
GFR calc non Af Amer: >60 mL/min (ref >60)
Glucose, Bld: 106 mg/dL — ABNORMAL HIGH (ref 70–99)
POTASSIUM: 4.2 mmol/L (ref 3.5–5.1)
Sodium: 139 mmol/L (ref 135–145)

## 2018-11-29 LAB — FERRITIN: Ferritin: 11 ng/mL (ref 11–307)

## 2018-11-29 LAB — CORTISOL: Cortisol, Plasma: 6.3 ug/dL

## 2018-11-29 LAB — D-DIMER, QUANTITATIVE: D-Dimer, Quant: <0.27 ug/mL-FEU (ref 0.00–0.50)

## 2018-11-29 LAB — FOLATE: Folate: 7.1 ng/mL

## 2018-11-29 LAB — BRAIN NATRIURETIC PEPTIDE: B Natriuretic Peptide: 44.3 pg/mL (ref 0.0–100.0)

## 2018-11-29 LAB — PREPARE RBC (CROSSMATCH)

## 2018-11-29 LAB — HIV ANTIBODY (ROUTINE TESTING W REFLEX): HIV SCREEN 4TH GENERATION: NONREACTIVE

## 2018-11-29 LAB — TSH: TSH: 0.984 u[IU]/mL (ref 0.350–4.500)

## 2018-11-29 LAB — VITAMIN B12: Vitamin B-12: 502 pg/mL (ref 180–914)

## 2018-11-29 MED ORDER — FERROUS SULFATE 325 (65 FE) MG PO TABS
325.0000 mg | ORAL_TABLET | Freq: Three times a day (TID) | ORAL | Status: DC
  Administered 2018-11-29: 325 mg via ORAL
  Filled 2018-11-29 (×2): qty 1

## 2018-11-29 MED ORDER — ONDANSETRON HCL 4 MG/2ML IJ SOLN: 4.0000 mg | Freq: Four times a day (QID) | INTRAMUSCULAR | Status: DC | PRN

## 2018-11-29 MED ORDER — ACETAMINOPHEN 325 MG PO TABS
650.0000 mg | ORAL_TABLET | Freq: Four times a day (QID) | ORAL | Status: DC | PRN
  Administered 2018-11-29: 650 mg via ORAL
  Filled 2018-11-29: qty 2

## 2018-11-29 MED ORDER — TRAMADOL HCL 50 MG PO TABS: 50.0000 mg | ORAL_TABLET | Freq: Three times a day (TID) | ORAL | Status: DC | PRN

## 2018-11-29 MED ORDER — ENOXAPARIN SODIUM 40 MG/0.4ML ~~LOC~~ SOLN
40.0000 mg | Freq: Every day | SUBCUTANEOUS | Status: DC
  Administered 2018-11-29 – 2018-12-03 (×5): 40 mg via SUBCUTANEOUS
  Filled 2018-11-29 (×5): qty 0.4

## 2018-11-29 MED ORDER — ESCITALOPRAM OXALATE 20 MG PO TABS
20.0000 mg | ORAL_TABLET | Freq: Every day | ORAL | Status: DC
  Administered 2018-11-29 – 2018-12-03 (×5): 20 mg via ORAL
  Filled 2018-11-29 (×5): qty 1

## 2018-11-29 MED ORDER — SODIUM CHLORIDE 0.9 % IV SOLN
10.0000 mL/h | Freq: Once | INTRAVENOUS | Status: AC
  Administered 2018-11-29: 10 mL/h via INTRAVENOUS

## 2018-11-29 MED ORDER — ACETAMINOPHEN 650 MG RE SUPP: 650.0000 mg | Freq: Four times a day (QID) | RECTAL | Status: DC | PRN

## 2018-11-29 MED ORDER — DOCUSATE SODIUM 100 MG PO CAPS: 100.0000 mg | ORAL_CAPSULE | Freq: Two times a day (BID) | ORAL | Status: DC | PRN

## 2018-11-29 MED ORDER — SODIUM CHLORIDE 0.9 % IV SOLN
500.0000 mg | Freq: Once | INTRAVENOUS | Status: AC
  Administered 2018-11-29: 500 mg via INTRAVENOUS
  Filled 2018-11-29: qty 10

## 2018-11-29 MED ORDER — SODIUM CHLORIDE 0.9 % IV SOLN
25.0000 mg | Freq: Once | INTRAVENOUS | Status: AC
  Administered 2018-11-29: 25 mg via INTRAVENOUS
  Filled 2018-11-29: qty 0.5

## 2018-11-29 MED ORDER — ONDANSETRON HCL 4 MG PO TABS: 4.0000 mg | ORAL_TABLET | Freq: Four times a day (QID) | ORAL | Status: DC | PRN

## 2018-11-29 NOTE — ED Notes (Signed)
Lab called; blood hemolyzed.   Patient currently receiving blood.

## 2018-11-29 NOTE — ED Notes (Signed)
Breakfast Tray ordered  

## 2018-11-29 NOTE — ED Notes (Signed)
Patient tolerating blood transfusion well, no reaction noted at this time. VSS.

## 2018-11-29 NOTE — H&P (Signed)
History and Physical    Christina Mcbride JIR:[id number] DOB: Jan 23, 1971 DOA: 11/28/2018  PCP: Alfonse Spruce, FNP  Patient coming from: Home.  Chief Complaint: Dizziness.  HPI: Christina Mcbride is a 48 y.o. female with history of iron deficiency anemia admitted almost a year ago with complaint of headache at that time hemoglobin was found to be low and had iron deficiency anemia was transfused and discharged home over the last 1 month has been feeling dizziness on exertion mostly on standing.  At times has been having palpitations.  Also shortness of breath.  Denies any chest pain.  Patient has not had any menstrual cycle for last many months.  Denies noticing any blood in the stools.  Patient states she has been taking her iron pills every day.  ED Course: In the ER patient's hemoglobin is found to be around 7 with microcytic picture.  EKG showing sinus tachycardia.  On standing patient's heart rate increases by 30.  Chest x-ray was unremarkable d-dimer negative, BNP was 44.3 and TSH was 0.98.  Given the symptoms and anemia PRBC transfusion has been ordered.  Anemia panel is pending.  Admitted for further observation.  Review of Systems: As per HPI, rest all negative.   Past Medical History:  Diagnosis Date  . Anemia   . Blood transfusion without reported diagnosis   . Migraine   . PTSD (post-traumatic stress disorder)    reports daughter was murdered   . Tachycardia   . Thyroid disease   . Vitamin D deficiency     Past Surgical History:  Procedure Laterality Date  . TUBAL LIGATION       reports that she has never smoked. She has never used smokeless tobacco. She reports that she does not drink alcohol or use drugs.  Allergies  Allergen Reactions  . Bee Venom Anaphylaxis    Family History  Problem Relation Age of Onset  . Hypertension Mother   . Arthritis Mother   . Hypertension Son   . Cancer Maternal Aunt   . Diabetes Maternal Grandmother     Prior to Admission  medications   Medication Sig Start Date End Date Taking? Authorizing Provider  aspirin-acetaminophen-caffeine (EXCEDRIN MIGRAINE) [phone] MG tablet Take 1-2 tablets by mouth every 6 (six) hours as needed for headache or migraine.   Yes [provider]  CALCIUM PO Take 500 mg by mouth daily.   Yes [provider]  docusate sodium (COLACE) 100 MG capsule Take 1 capsule (100 mg total) by mouth 2 (two) times daily as needed for mild constipation. 01/29/17  Yes Jani Gravel, MD  escitalopram (LEXAPRO) 10 MG tablet Take 1 tablet (10 mg total) by mouth daily. After 4 weeks , may increase dose to 2 tablets ( 20 mg total) daily. Patient taking differently: Take 20 mg by mouth daily.  02/26/17  Yes Hairston, Toy Baker R, FNP  ferrous sulfate 325 (65 FE) MG tablet Take 1 tablet (325 mg total) by mouth 3 (three) times daily with meals. 05/29/18  Yes McClung, Dionne Bucy, PA-C  traMADol (ULTRAM) 50 MG tablet Take 1 tablet (50 mg total) by mouth every 8 (eight) hours as needed. Patient taking differently: Take 50 mg by mouth every 8 (eight) hours as needed (for pain).  05/29/18  Yes McClung, Dionne Bucy, PA-C  methocarbamol (ROBAXIN) 500 MG tablet Take 1 tablet (500 mg total) by mouth every 8 (eight) hours as needed for muscle spasms. Patient not taking: Reported on 11/28/2018 05/29/18   Thereasa Solo,  Dionne Bucy, PA-C  naproxen (NAPROSYN) 500 MG tablet Take 1 tablet (500 mg total) by mouth 2 (two) times daily with a meal. Patient not taking: Reported on 11/28/2018 02/01/17   Lysbeth Penner, FNP  SUMAtriptan (IMITREX) 50 MG tablet Take 50 mg by mouth every 2 (two) hours as needed for migraine. May repeat in 2 hours if headache persists or recurs.    [provider]  topiramate (TOPAMAX) 50 MG tablet Take 50 mg by mouth 2 (two) times daily.    [provider]    Physical Exam: Vitals:   11/28/18 2330 11/29/18 0045 11/29/18 0115 11/29/18 0230  BP: 113/77 117/63 106/60 120/62  Pulse: 94 89 92 88    Resp: 13 15 14 12   Temp:      TempSrc:      SpO2: 98% 99% 95% 99%      Constitutional: Moderately built and nourished. Vitals:   11/28/18 2330 11/29/18 0045 11/29/18 0115 11/29/18 0230  BP: 113/77 117/63 106/60 120/62  Pulse: 94 89 92 88  Resp: 13 15 14 12   Temp:      TempSrc:      SpO2: 98% 99% 95% 99%   Eyes: Anicteric mild pallor. ENMT: No discharge from the ears eyes nose or mouth. Neck: No mass felt.  No neck rigidity. Respiratory: No rhonchi or crepitations. Cardiovascular: S1-S2 heard. Abdomen: Soft nontender bowel sounds present. Musculoskeletal: No edema.  No joint effusion. Skin: No rash. Neurologic: Alert awake oriented to time place and person.  Moves all extremities. Psychiatric: Appears normal per normal affect.   Labs on Admission: I have personally reviewed following labs and imaging studies  CBC: Recent Labs  Lab 11/28/18 2135 11/28/18 2232  WBC 9.0  --   NEUTROABS 6.0  --   HGB 7.5* 8.5*  HCT 28.8* 25.0*  MCV 68.7*  --   PLT 538*  --    Basic Metabolic Panel: Recent Labs  Lab 11/28/18 2135 11/28/18 2232  NA 141 142  K 3.9 3.9  CL 109 108  CO2 24  --   GLUCOSE 77 80  BUN 7 8  CREATININE 1.07* 1.00  CALCIUM 8.7*  --    GFR: CrCl cannot be calculated (Unknown ideal weight.). Liver Function Tests: No results for input(s): AST, ALT, ALKPHOS, BILITOT, PROT, ALBUMIN in the last 168 hours. No results for input(s): LIPASE, AMYLASE in the last 168 hours. No results for input(s): AMMONIA in the last 168 hours. Coagulation Profile: No results for input(s): INR, PROTIME in the last 168 hours. Cardiac Enzymes: No results for input(s): CKTOTAL, CKMB, CKMBINDEX, TROPONINI in the last 168 hours. BNP (last 3 results) No results for input(s): PROBNP in the last 8760 hours. HbA1C: No results for input(s): HGBA1C in the last 72 hours. CBG: No results for input(s): GLUCAP in the last 168 hours. Lipid Profile: No results for input(s): CHOL, HDL,  LDLCALC, TRIG, CHOLHDL, LDLDIRECT in the last 72 hours. Thyroid Function Tests: Recent Labs    11/28/18 2343  TSH 0.984   Anemia Panel: No results for input(s): VITAMINB12, FOLATE, FERRITIN, TIBC, IRON, RETICCTPCT in the last 72 hours. Urine analysis:    Component Value Date/Time   COLORURINE YELLOW 11/29/2018 0215   APPEARANCEUR HAZY (A) 11/29/2018 0215   LABSPEC 1.025 11/29/2018 0215   PHURINE 5.0 11/29/2018 0215   GLUCOSEU NEGATIVE 11/29/2018 0215   HGBUR SMALL (A) 11/29/2018 0215   BILIRUBINUR NEGATIVE 11/29/2018 0215   KETONESUR NEGATIVE 11/29/2018 0215   PROTEINUR  NEGATIVE 11/29/2018 0215   UROBILINOGEN 0.2 07/10/2015 1115   NITRITE NEGATIVE 11/29/2018 0215   LEUKOCYTESUR NEGATIVE 11/29/2018 0215   Sepsis Labs: @LABRCNTIP (procalcitonin:4,lacticidven:4) )No results found for this or any previous visit (from the past 240 hour(s)).   Radiological Exams on Admission: Dg Chest Port 1 View  Result Date: 11/28/2018 CLINICAL DATA:  Shortness of breath for 3 days. EXAM: PORTABLE CHEST 1 VIEW COMPARISON:  Lung bases from abdominal CT 03/01/2016 FINDINGS: Heart is normal in size. Rounded retrocardiac density represents hiatal hernia is seen on prior abdominal CT. No pulmonary edema, confluent airspace disease, pleural effusion or pneumothorax. IMPRESSION: 1. No acute chest findings. 2. Hiatal hernia. Electronically Signed   By: Keith Rake M.D.   On: 11/28/2018 22:53    EKG: Independently reviewed.  Tachycardia.  Assessment/Plan Principal Problem:   Orthostatic hypotension Active Problems:   Symptomatic anemia    1. Orthostatic tachycardia with dizziness -not sure if patient has POTS however since patient has anemia which is been persistent PRBC transfusion has been ordered.  Recheck vital signs after transfusion.  Check cortisol level. 2. Persistent microcytic hypochromic anemia despite taking iron tablets.  Check anemia panel.  If patient still has low iron levels may  need GI work-up.  Patient states she has not had any menstrual cycle for many months.  Follow CBC after transfusion. 3. History of chronic pain takes tramadol. 4. History of depression on Lexapro.   DVT prophylaxis: SCDs. Code Status: Full code. Family Communication: Discussed with patient. Disposition Plan: Home. Consults called: None. Admission status: Observation.   Rise Patience MD [facility] Pager [phone].  If 7PM-7AM, please contact night-coverage [email or web address] Password [email or web address]  11/29/2018, 3:08 AM

## 2018-11-29 NOTE — ED Notes (Signed)
MD at bedside. 

## 2018-11-29 NOTE — Progress Notes (Signed)
Christina Mcbride is a 48 y.o. female with history of iron deficiency anemia admitted almost a year ago with complaint of headache at that time hemoglobin was found to be low and had iron deficiency anemia was transfused and discharged home over the last 1 month has been feeling dizziness on exertion mostly on standing.  Admitted for symptomatic anemia.  S/p 1 unit of prbc transfusion.  Continues to have dizziness.   Plan:  Anemia panel showing iron deficiency- IV iron infusion.  Orthostatic vital sings.    Hosie Poisson, MD [phone]

## 2018-11-29 NOTE — ED Provider Notes (Signed)
Care assumed from Dr. Tamala Julian and Dr. Regenia Skeeter, patient with orthostatic dizziness and weakness, found to have orthostatic changes consisting of significant increase in heart rate.  She had received 2 L of IV fluid and continues to have similar orthostatic vital sign changes.  Labs show microcytic anemia which is not significantly changed from her baseline.  However, since she did not improve with IV fluids, it is felt that she might benefit from blood transfusion.  Also, urinalysis will be checked to rule out occult UTI.  Case is discussed with Dr. Hal Hope of [facility], who agrees to admit the patient.   Delora Fuel, MD 30/85/69 [phone]

## 2018-11-30 DIAGNOSIS — D509 Iron deficiency anemia, unspecified: Secondary | ICD-10-CM

## 2018-11-30 DIAGNOSIS — R42 Dizziness and giddiness: Secondary | ICD-10-CM | POA: Diagnosis not present

## 2018-11-30 DIAGNOSIS — I951 Orthostatic hypotension: Secondary | ICD-10-CM | POA: Diagnosis present

## 2018-11-30 DIAGNOSIS — K219 Gastro-esophageal reflux disease without esophagitis: Secondary | ICD-10-CM | POA: Diagnosis present

## 2018-11-30 DIAGNOSIS — E559 Vitamin D deficiency, unspecified: Secondary | ICD-10-CM | POA: Diagnosis present

## 2018-11-30 DIAGNOSIS — Z9851 Tubal ligation status: Secondary | ICD-10-CM | POA: Diagnosis not present

## 2018-11-30 DIAGNOSIS — G8929 Other chronic pain: Secondary | ICD-10-CM | POA: Diagnosis present

## 2018-11-30 DIAGNOSIS — Z91030 Bee allergy status: Secondary | ICD-10-CM | POA: Diagnosis not present

## 2018-11-30 DIAGNOSIS — F4310 Post-traumatic stress disorder, unspecified: Secondary | ICD-10-CM | POA: Diagnosis present

## 2018-11-30 DIAGNOSIS — Z833 Family history of diabetes mellitus: Secondary | ICD-10-CM | POA: Diagnosis not present

## 2018-11-30 DIAGNOSIS — D500 Iron deficiency anemia secondary to blood loss (chronic): Secondary | ICD-10-CM | POA: Diagnosis not present

## 2018-11-30 DIAGNOSIS — Z79899 Other long term (current) drug therapy: Secondary | ICD-10-CM | POA: Diagnosis not present

## 2018-11-30 DIAGNOSIS — Z8249 Family history of ischemic heart disease and other diseases of the circulatory system: Secondary | ICD-10-CM | POA: Diagnosis not present

## 2018-11-30 DIAGNOSIS — Z79891 Long term (current) use of opiate analgesic: Secondary | ICD-10-CM | POA: Diagnosis not present

## 2018-11-30 DIAGNOSIS — Z8261 Family history of arthritis: Secondary | ICD-10-CM | POA: Diagnosis not present

## 2018-11-30 DIAGNOSIS — Z7982 Long term (current) use of aspirin: Secondary | ICD-10-CM | POA: Diagnosis not present

## 2018-11-30 DIAGNOSIS — E860 Dehydration: Secondary | ICD-10-CM | POA: Diagnosis present

## 2018-11-30 LAB — TYPE AND SCREEN
ABO/RH(D): B POS
ANTIBODY SCREEN: NEGATIVE
Unit division: 0
Unit division: 0

## 2018-11-30 LAB — BPAM RBC
BLOOD PRODUCT EXPIRATION DATE: 202001272359
Blood Product Expiration Date: 202001272359
ISSUE DATE / TIME: 202001030353
ISSUE DATE / TIME: 202001030625
Unit Type and Rh: 7300
Unit Type and Rh: 7300

## 2018-11-30 LAB — OCCULT BLOOD X 1 CARD TO LAB, STOOL: Fecal Occult Bld: NEGATIVE

## 2018-11-30 LAB — HEMOGLOBIN AND HEMATOCRIT, BLOOD
HCT: 34.6 % — ABNORMAL LOW (ref 36.0–46.0)
Hemoglobin: 9.9 g/dL — ABNORMAL LOW (ref 12.0–15.0)

## 2018-11-30 LAB — HEMOGLOBIN A1C
Hgb A1c MFr Bld: 5.2 % (ref 4.8–5.6)
Mean Plasma Glucose: 102.54 mg/dL

## 2018-11-30 MED ORDER — SUMATRIPTAN SUCCINATE 50 MG PO TABS: 50.0000 mg | ORAL_TABLET | ORAL | 0 refills | Status: DC | PRN

## 2018-11-30 MED ORDER — TOPIRAMATE 50 MG PO TABS: 50.0000 mg | ORAL_TABLET | Freq: Two times a day (BID) | ORAL | 0 refills | Status: DC

## 2018-11-30 MED ORDER — FERROUS SULFATE 325 (65 FE) MG PO TABS: 325.0000 mg | ORAL_TABLET | Freq: Three times a day (TID) | ORAL | 2 refills | Status: DC

## 2018-11-30 MED ORDER — SODIUM CHLORIDE 0.9 % IV SOLN
INTRAVENOUS | Status: DC
  Administered 2018-11-30 – 2018-12-02 (×2): via INTRAVENOUS

## 2018-11-30 MED ORDER — ESCITALOPRAM OXALATE 20 MG PO TABS: 20.0000 mg | ORAL_TABLET | Freq: Every day | ORAL | 0 refills | Status: DC

## 2018-11-30 NOTE — Progress Notes (Addendum)
PROGRESS NOTE    Christina Mcbride  TOI:[id number] DOB: January 18, 1971 DOA: 11/28/2018 PCP: Alfonse Spruce, FNP    Brief Narrative:  [name] a 48 y.o.femalewithhistory of iron deficiency anemia admitted almost a year ago with complaint of headache at that time hemoglobin was found to be low and had iron deficiency anemia was transfused and discharged home over the last 1 month has been feeling dizziness on exertion mostly on standing.   Assessment & Plan:   Principal Problem:   Orthostatic hypotension Active Problems:   Symptomatic anemia   Dizziness, orthostatic hypotension:  Suspect from dehydration vs symptomatic anemia.  Hydrate with NS fluids and repeat orthostatic vital signs negative.  She remains symptomatic with dizziness and tachycardia of 140's.  Echocardiogram ordered.  TSH wnl.  D dimer negative.   Further recommendations to follow.   ? Iron deficiency anemia:  S/p 1 unit of prbc transfusion.  - IV iron infusion given. Stool for occult blood ordered.  Pending .    DVT prophylaxis: scd's Code Status:  Full code.  Family Communication: none at bedside.  Disposition Plan: pending clinical improvement.    Consultants:   None.   Procedures:  Echocardiogram.   Antimicrobials: none.    Subjective: Became severely dizzy on standing and walking.  Became tachycardic to 140/min earlier.    Objective: Vitals:   11/29/18 2050 11/29/18 2334 11/30/18 0549 11/30/18 1355  BP:  125/74 125/79 (!) 143/92  Pulse:  90 87 87  Resp:  16 17 20   Temp:  98.4 F (36.9 C) 98.1 F (36.7 C) 98.7 F (37.1 C)  TempSrc:  Oral Oral Oral  SpO2:   98%   Weight: 105.2 kg     Height: 5\' 7"  (1.702 m)       Intake/Output Summary (Last 24 hours) at 11/30/2018 1531 Last data filed at 11/30/2018 1523 Gross per 24 hour  Intake 0.21 ml  Output -  Net 0.21 ml   Filed Weights   11/29/18 2050  Weight: 105.2 kg    Examination:  General exam: Appears calm and  comfortable  Respiratory system: Clear to auscultation. Respiratory effort normal. Cardiovascular system: S1 & S2 heard, tachycardia.  Gastrointestinal system: Abdomen is nondistended, soft and nontender. No organomegaly or masses felt. Normal bowel sounds heard. Central nervous system: Alert and oriented. No focal neurological deficits. Extremities: Symmetric 5 x 5 power. Skin: No rashes, lesions or ulcers Psychiatry: Mood & affect appropriate.     Data Reviewed: I have personally reviewed following labs and imaging studies  CBC: Recent Labs  Lab 11/28/18 2135 11/28/18 2232 11/29/18 1322 11/30/18 1045  WBC 9.0  --  9.2  --   NEUTROABS 6.0  --   --   --   HGB 7.5* 8.5* 9.3* 9.9*  HCT 28.8* 25.0* 33.0* 34.6*  MCV 68.7*  --  70.5*  --   PLT 538*  --  486*  --    Basic Metabolic Panel: Recent Labs  Lab 11/28/18 2135 11/28/18 2232 11/29/18 0333  NA 141 142 139  K 3.9 3.9 4.2  CL 109 108 109  CO2 24  --  24  GLUCOSE 77 80 106*  BUN 7 8 14   CREATININE 1.07* 1.00 0.85  CALCIUM 8.7*  --  8.5*   GFR: Estimated Creatinine Clearance: 100.9 mL/min (by C-G formula based on SCr of 0.85 mg/dL). Liver Function Tests: No results for input(s): AST, ALT, ALKPHOS, BILITOT, PROT, ALBUMIN in the last 168 hours. No results for  input(s): LIPASE, AMYLASE in the last 168 hours. No results for input(s): AMMONIA in the last 168 hours. Coagulation Profile: No results for input(s): INR, PROTIME in the last 168 hours. Cardiac Enzymes: No results for input(s): CKTOTAL, CKMB, CKMBINDEX, TROPONINI in the last 168 hours. BNP (last 3 results) No results for input(s): PROBNP in the last 8760 hours. HbA1C: No results for input(s): HGBA1C in the last 72 hours. CBG: No results for input(s): GLUCAP in the last 168 hours. Lipid Profile: No results for input(s): CHOL, HDL, LDLCALC, TRIG, CHOLHDL, LDLDIRECT in the last 72 hours. Thyroid Function Tests: Recent Labs    11/28/18 2343  TSH 0.984    Anemia Panel: Recent Labs    11/29/18 0333 11/29/18 1322  VITAMINB12 502  --   FOLATE 7.1  --   FERRITIN 11  --   TIBC 336  --   IRON 27*  --   RETICCTPCT  --  0.7   Sepsis Labs: No results for input(s): PROCALCITON, LATICACIDVEN in the last 168 hours.  No results found for this or any previous visit (from the past 240 hour(s)).       Radiology Studies: Dg Chest Port 1 View  Result Date: 11/28/2018 CLINICAL DATA:  Shortness of breath for 3 days. EXAM: PORTABLE CHEST 1 VIEW COMPARISON:  Lung bases from abdominal CT 03/01/2016 FINDINGS: Heart is normal in size. Rounded retrocardiac density represents hiatal hernia is seen on prior abdominal CT. No pulmonary edema, confluent airspace disease, pleural effusion or pneumothorax. IMPRESSION: 1. No acute chest findings. 2. Hiatal hernia. Electronically Signed   By: Keith Rake M.D.   On: 11/28/2018 22:53        Scheduled Meds: . enoxaparin (LOVENOX) injection  40 mg Subcutaneous Daily  . escitalopram  20 mg Oral Daily   Continuous Infusions: . sodium chloride 50 mL/hr at 11/30/18 1523     LOS: 0 days    Time spent: 35 minutes.     Hosie Poisson, MD [facility] Pager [phone]  If 7PM-7AM, please contact night-coverage [email or web address] Password [email or web address] 11/30/2018, 3:31 PM

## 2018-12-01 ENCOUNTER — Inpatient Hospital Stay (HOSPITAL_COMMUNITY): Payer: Medicaid Other

## 2018-12-01 DIAGNOSIS — I951 Orthostatic hypotension: Secondary | ICD-10-CM

## 2018-12-01 LAB — ECHOCARDIOGRAM COMPLETE
HEIGHTINCHES: 67 in
Weight: 3670.4 oz

## 2018-12-01 NOTE — Progress Notes (Signed)
  Echocardiogram 2D Echocardiogram has been performed.  Christina Mcbride 12/01/2018, 10:53 AM

## 2018-12-01 NOTE — Progress Notes (Signed)
PROGRESS NOTE    Christina Mcbride  CZY:[id number] DOB: 06-Sep-1971 DOA: 11/28/2018 PCP: Alfonse Spruce, FNP    Brief Narrative:  Christina Mcbride a 48 y.o.femalewithhistory of iron deficiency anemia admitted almost a year ago with complaint of headache at that time hemoglobin was found to be low and had iron deficiency anemia was transfused and discharged home over the last 1 month has been feeling dizziness on exertion mostly on standing.   Assessment & Plan:   Principal Problem:   Orthostatic hypotension Active Problems:   Symptomatic anemia   Dizziness, orthostatic hypotension:  Suspect from dehydration vs symptomatic anemia.  Hydrate with NS fluids and repeat orthostatic vital signs negative.  Since she was symptomatic with dizziness and tachycardia of 140's.  Echocardiogram ordered, it showed Normal LV systolic function; probable mild diastolic dysfunction. TSH wnl.  D dimer negative.   If no symptoms overnight will d/c pt in am.   ? Iron deficiency anemia:  S/p 1 unit of prbc transfusion.  - IV iron infusion given. Stool for occult blood ordered and is negative.    DVT prophylaxis: scd's Code Status:  Full code.  Family Communication: none at bedside.  Disposition Plan: pending clinical improvement.    Consultants:   None.   Procedures:  Echocardiogram.   Antimicrobials: none.    Subjective: Dizziness, improved, but not completed resolved. Watch overnight with fluids and walk her in the hallway and if asymptomatic, d/c home in am.    Objective: Vitals:   11/30/18 2149 12/01/18 0312 12/01/18 0904 12/01/18 1744  BP: (!) 142/89 127/84 (!) 143/76 132/86  Pulse: 66 88 78 95  Resp: 18 16 18 20   Temp: 98.6 F (37 C) 98.4 F (36.9 C) 98.3 F (36.8 C) 99.3 F (37.4 C)  TempSrc: Oral Oral Oral Oral  SpO2: 96% 96% 94% 94%  Weight:      Height:        Intake/Output Summary (Last 24 hours) at 12/01/2018 1845 Last data filed at 12/01/2018 1808 Gross  per 24 hour  Intake 1930.8 ml  Output 1100 ml  Net 830.8 ml   Filed Weights   11/29/18 2050 11/30/18 2035  Weight: 105.2 kg 104.1 kg    Examination:  General exam: Appears calm and comfortable , not in distress.  Respiratory system: Clear to auscultation. Respiratory effort normal. No wheezing or rhonchi.  Cardiovascular system: S1 & S2 heard, tachycardia.  Gastrointestinal system: Abdomen is soft NT ND BS+ Central nervous system: Alert and oriented. No focal neurological deficits. Extremities: Symmetric 5 x 5 power. Skin: No rashes, lesions or ulcers Psychiatry: Mood & affect appropriate.     Data Reviewed: I have personally reviewed following labs and imaging studies  CBC: Recent Labs  Lab 11/28/18 2135 11/28/18 2232 11/29/18 1322 11/30/18 1045  WBC 9.0  --  9.2  --   NEUTROABS 6.0  --   --   --   HGB 7.5* 8.5* 9.3* 9.9*  HCT 28.8* 25.0* 33.0* 34.6*  MCV 68.7*  --  70.5*  --   PLT 538*  --  486*  --    Basic Metabolic Panel: Recent Labs  Lab 11/28/18 2135 11/28/18 2232 11/29/18 0333  NA 141 142 139  K 3.9 3.9 4.2  CL 109 108 109  CO2 24  --  24  GLUCOSE 77 80 106*  BUN 7 8 14   CREATININE 1.07* 1.00 0.85  CALCIUM 8.7*  --  8.5*   GFR: Estimated Creatinine Clearance: 100.4 mL/min (  by C-G formula based on SCr of 0.85 mg/dL). Liver Function Tests: No results for input(s): AST, ALT, ALKPHOS, BILITOT, PROT, ALBUMIN in the last 168 hours. No results for input(s): LIPASE, AMYLASE in the last 168 hours. No results for input(s): AMMONIA in the last 168 hours. Coagulation Profile: No results for input(s): INR, PROTIME in the last 168 hours. Cardiac Enzymes: No results for input(s): CKTOTAL, CKMB, CKMBINDEX, TROPONINI in the last 168 hours. BNP (last 3 results) No results for input(s): PROBNP in the last 8760 hours. HbA1C: Recent Labs    11/30/18 1045  HGBA1C 5.2   CBG: No results for input(s): GLUCAP in the last 168 hours. Lipid Profile: No results  for input(s): CHOL, HDL, LDLCALC, TRIG, CHOLHDL, LDLDIRECT in the last 72 hours. Thyroid Function Tests: Recent Labs    11/28/18 2343  TSH 0.984   Anemia Panel: Recent Labs    11/29/18 0333 11/29/18 1322  VITAMINB12 502  --   FOLATE 7.1  --   FERRITIN 11  --   TIBC 336  --   IRON 27*  --   RETICCTPCT  --  0.7   Sepsis Labs: No results for input(s): PROCALCITON, LATICACIDVEN in the last 168 hours.  No results found for this or any previous visit (from the past 240 hour(s)).       Radiology Studies: No results found.      Scheduled Meds: . enoxaparin (LOVENOX) injection  40 mg Subcutaneous Daily  . escitalopram  20 mg Oral Daily   Continuous Infusions: . sodium chloride 50 mL/hr at 11/30/18 1523     LOS: 1 day    Time spent: 25 minutes.     Hosie Poisson, MD [facility] Pager [phone]  If 7PM-7AM, please contact night-coverage [email or web address] Password [facility] 12/01/2018, 6:45 PM

## 2018-12-02 ENCOUNTER — Inpatient Hospital Stay (HOSPITAL_COMMUNITY): Payer: Medicaid Other

## 2018-12-02 DIAGNOSIS — R42 Dizziness and giddiness: Secondary | ICD-10-CM

## 2018-12-02 LAB — CBC
HCT: 31.2 % — ABNORMAL LOW (ref 36.0–46.0)
Hemoglobin: 8.9 g/dL — ABNORMAL LOW (ref 12.0–15.0)
MCH: 20.1 pg — ABNORMAL LOW (ref 26.0–34.0)
MCHC: 28.5 g/dL — ABNORMAL LOW (ref 30.0–36.0)
MCV: 70.6 fL — ABNORMAL LOW (ref 80.0–100.0)
Platelets: 466 10*3/uL — ABNORMAL HIGH (ref 150–400)
RBC: 4.42 MIL/uL (ref 3.87–5.11)
RDW: 23.2 % — ABNORMAL HIGH (ref 11.5–15.5)
WBC: 9.7 10*3/uL (ref 4.0–10.5)
nRBC: 0.5 % — ABNORMAL HIGH (ref 0.0–0.2)

## 2018-12-02 LAB — BASIC METABOLIC PANEL
Anion gap: 7 (ref 5–15)
BUN: 9 mg/dL (ref 6–20)
CO2: 24 mmol/L (ref 22–32)
Calcium: 8.6 mg/dL — ABNORMAL LOW (ref 8.9–10.3)
Chloride: 105 mmol/L (ref 98–111)
Creatinine, Ser: 0.87 mg/dL (ref 0.44–1.00)
GFR calc Af Amer: >60 mL/min (ref >60)
Glucose, Bld: 80 mg/dL (ref 70–99)
Potassium: 3.9 mmol/L (ref 3.5–5.1)
Sodium: 136 mmol/L (ref 135–145)

## 2018-12-02 LAB — PHOSPHORUS: Phosphorus: 3.6 mg/dL (ref 2.5–4.6)

## 2018-12-02 LAB — TROPONIN I: Troponin I: <0.03 ng/mL (ref <0.03)

## 2018-12-02 LAB — MAGNESIUM: Magnesium: 2.1 mg/dL (ref 1.7–2.4)

## 2018-12-02 MED ORDER — FOLIC ACID 1 MG PO TABS
1.0000 mg | ORAL_TABLET | Freq: Every day | ORAL | Status: DC
  Administered 2018-12-02 – 2018-12-03 (×2): 1 mg via ORAL
  Filled 2018-12-02 (×2): qty 1

## 2018-12-02 MED ORDER — MECLIZINE HCL 25 MG PO TABS
25.0000 mg | ORAL_TABLET | Freq: Two times a day (BID) | ORAL | Status: DC | PRN
  Administered 2018-12-02: 25 mg via ORAL
  Filled 2018-12-02: qty 1

## 2018-12-02 NOTE — Evaluation (Signed)
Physical Therapy Vestibular/Mobility Evaluation Patient Details Name: Christina Mcbride MRN: [id number] DOB: 1971/01/07 Today's Date: 12/02/2018   History of Present Illness  Pt is a 48 y/o female who presents with 1 month history of dizziness on exertion - mostly on standing. Pt reports occasional palpitations and SOB as well. PMH significant for thyroid disease, PTSD, tachycardia, migraine, anemia.  Clinical Impression  Pt admitted with above diagnosis. Pt currently with functional limitations due to the deficits listed below (see PT Problem List). Vestibular testing revealed a positive R posterior canal, and x2 rounds of Epley Maneuver performed. Pt reports improvement in symptoms at end of session. No nystagmus was noted with horizontal or posterior canal testing, however did see mild R beating nystagmus during first round of Epley Maneuver. Recommending further vestibular physical therapy follow-up at discharge. Acutely, pt will benefit from skilled PT to increase their independence and safety with mobility to allow discharge to the venue listed below.    Of note: HR 125 bpm during ~150' ambulation in hall     Follow Up Recommendations Outpatient PT(for Vestibular follow-up)    Equipment Recommendations  None recommended by PT    Recommendations for Other Services       Precautions / Restrictions Precautions Precautions: Fall Restrictions Weight Bearing Restrictions: No      Mobility  Bed Mobility Overal bed mobility: Modified Independent                Transfers Overall transfer level: Modified independent Equipment used: None             General transfer comment: Increased time due to dizziness.  Ambulation/Gait Ambulation/Gait assistance: Supervision Gait Distance (Feet): 150 Feet Assistive device: None Gait Pattern/deviations: Step-through pattern;Decreased stride length Gait velocity: Decreased Gait velocity interpretation: 1.31 - 2.62 ft/sec, indicative  of limited community ambulator General Gait Details: Slow and guarded. Pt was instructed in gaze stabilization which she states improved symptoms.   Stairs            Wheelchair Mobility    Modified Rankin (Stroke Patients Only)       Balance Vestibular Assessment - 12/02/18 0001      Vestibular Assessment   General Observation  Pt reports onset of migraine and onset of dizziness at the same time. Pt denies tinnitis, hearing loss, sinus drainage or upper respiratory symptoms, change in medication, corrective eye surgery, or recent head trauma/whiplash. Pt endorses migraines and wears reading glasses.       Symptom Behavior   Type of Dizziness  Comment   "Wavy - I feel like I am moving"   Frequency of Dizziness  with standing    Duration of Dizziness  ~30-45 seconds    Aggravating Factors  Moving eyes;Turning head quickly;Sit to stand    Relieving Factors  Closing eyes      Occulomotor Exam   Occulomotor Alignment  Normal    Spontaneous  Absent    Gaze-induced  Absent    Smooth Pursuits  Saccades    Saccades  Dysmetria    Comment  Noted on L eye more than R      Positional Testing   Dix-Hallpike  Dix-Hallpike Right;Dix-Hallpike Left    Horizontal Canal Testing  Horizontal Canal Right;Horizontal Canal Left      Dix-Hallpike Right   Dix-Hallpike Right Duration  ~30 seconds    Dix-Hallpike Right Symptoms  No nystagmus      Dix-Hallpike Left   Dix-Hallpike Left Duration  No symptoms    Dix-Hallpike  Left Symptoms  No nystagmus      Horizontal Canal Right   Horizontal Canal Right Symptoms  Normal      Horizontal Canal Left   Horizontal Canal Left Symptoms  Normal           Pertinent Vitals/Pain Pain Assessment: No/denies pain    Home Living Family/patient expects to be discharged to:: Private residence Living Arrangements: Children Available Help at Discharge: Family Type of Home: Apartment Home Access: Level entry     Home Layout: One level Home  Equipment: None      Prior Function Level of Independence: Independent         Comments: Does not drive. Bus to the store, then takes a cab home.     Hand Dominance   Dominant Hand: Right    Extremity/Trunk Assessment   Upper Extremity Assessment Upper Extremity Assessment: Overall WFL for tasks assessed    Lower Extremity Assessment Lower Extremity Assessment: Overall WFL for tasks assessed    Cervical / Trunk Assessment Cervical / Trunk Assessment: Normal  Communication   Communication: No difficulties  Cognition Arousal/Alertness: Awake/alert Behavior During Therapy: WFL for tasks assessed/performed Overall Cognitive Status: Within Functional Limits for tasks assessed                                        General Comments      Exercises     Assessment/Plan    PT Assessment Patient needs continued PT services  PT Problem List Decreased activity tolerance;Decreased balance;Decreased knowledge of precautions       PT Treatment Interventions DME instruction;Gait training;Stair training;Functional mobility training;Therapeutic activities;Therapeutic exercise;Neuromuscular re-education;Patient/family education    PT Goals (Current goals can be found in the Care Plan section)  Acute Rehab PT Goals Patient Stated Goal: Get home to her sons PT Goal Formulation: With patient Time For Goal Achievement: 12/09/18 Potential to Achieve Goals: Good    Frequency Min 3X/week   Barriers to discharge        Co-evaluation               AM-PAC PT "6 Clicks" Mobility  Outcome Measure Help needed turning from your back to your side while in a flat bed without using bedrails?: None Help needed moving from lying on your back to sitting on the side of a flat bed without using bedrails?: None Help needed moving to and from a bed to a chair (including a wheelchair)?: None Help needed standing up from a chair using your arms (e.g., wheelchair or  bedside chair)?: None Help needed to walk in hospital room?: A Little Help needed climbing 3-5 steps with a railing? : A Little 6 Click Score: 22    End of Session Equipment Utilized During Treatment: Gait belt Activity Tolerance: Patient tolerated treatment well Patient left: in chair;with call bell/phone within reach Nurse Communication: Mobility status PT Visit Diagnosis: Dizziness and giddiness (R42);BPPV BPPV - Right/Left : Right    Time: 1316-1406 PT Time Calculation (min) (ACUTE ONLY): 50 min   Charges:   PT Evaluation $PT Eval Moderate Complexity: 1 Mod PT Treatments $Gait Training: 8-22 mins $Canalith Rep Proc: 8-22 mins        Rolinda Roan, PT, DPT Acute Rehabilitation Services Pager: [phone] Office: [phone]   Thelma Comp 12/02/2018, 2:59 PM

## 2018-12-02 NOTE — Progress Notes (Signed)
At approximately 0435, the patient complained of mid sternum to left chest pressure.  She rated as 8/10 and stated the pressure lasted approximately 10 minutes and resolved.  She did state that her dizziness did worsen during this time.  Vital signs stable.  Stable on telemetry.  [facility] made aware.  Order received for CBC and to give Antivert.  This was given at [place].  At shift change, the patient again stated that she had 2 bouts of chest pressure.  The las one she rated as 4/10 and it was resolving while we were in the room.  She stated that her dizziness was still significant.  Will continue to monitor patient.  Earleen Reaper RN

## 2018-12-02 NOTE — Progress Notes (Signed)
Attempted to see the patient however in radiology.  Will do consult tomorrow.

## 2018-12-02 NOTE — Progress Notes (Signed)
Pt is feeling better no c/o chest pain, but she refused bed alarm to be initiated.  She said she will call for help.  Educated her regarding the safety measures, verbalized understanding.

## 2018-12-02 NOTE — Progress Notes (Signed)
PROGRESS NOTE    Christina Mcbride  ZOX:[id number] DOB: [birth year] DOA: 11/28/2018 PCP: Alfonse Spruce, FNP    Brief Narrative:  Christina Mcbride a 48 y.o.femalewithhistory of iron deficiency anemia admitted almost a year ago with complaint of headache at that time hemoglobin was found to be low and had iron deficiency anemia was transfused and discharged home over the last 1 month has been feeling dizziness on exertion mostly on standing.   Assessment & Plan:   Principal Problem:   Orthostatic hypotension Active Problems:   Symptomatic anemia   Dizziness, orthostatic hypotension:  Suspect from dehydration vs symptomatic anemia.  Hydrate with NS fluids and repeat orthostatic vital signs negative.  Since she was symptomatic with dizziness and tachycardia of 140's.  Echocardiogram ordered, it showed Normal LV systolic function; probable mild diastolic dysfunction. TSH wnl.  D dimer negative.  Since she has persistent symptoms of dizziness, feel like passing out, and tachycardia to 150/min  On ambulation, we will get cardiology consult to see if she needs event monitor.  PT vestibular evaluation ordered.   ? Iron deficiency anemia:  S/p 1 unit of prbc transfusion.  - IV iron infusion given. Stool for occult blood ordered and is negative.    DVT prophylaxis: scd's Code Status:  Full code.  Family Communication: none at bedside.  Disposition Plan: pending clinical improvement.    Consultants:   None.   Procedures:  Echocardiogram.   Antimicrobials: none.    Subjective: Dizziness persistent. Tachycardia, and chest pressure earlier today.  EKG ordered.    Objective: Vitals:   12/01/18 1744 12/01/18 2220 12/02/18 0447 12/02/18 0900  BP: 132/86 113/71 128/79 115/69  Pulse: 95 91 80 82  Resp: 20 16 16 19   Temp: 99.3 F (37.4 C) 98.7 F (37.1 C) 98 F (36.7 C)   TempSrc: Oral Oral Oral   SpO2: 94% 96% 100% 96%  Weight:  103.4 kg    Height:         Intake/Output Summary (Last 24 hours) at 12/02/2018 1807 Last data filed at 12/02/2018 1330 Gross per 24 hour  Intake 1780 ml  Output 0 ml  Net 1780 ml   Filed Weights   11/29/18 2050 11/30/18 2035 12/01/18 2220  Weight: 105.2 kg 104.1 kg 103.4 kg    Examination:  General exam: Appears calm and comfortable , not in distress.  Respiratory system: Clear to auscultation. Respiratory effort normal. No wheezing or rhonchi.  Cardiovascular system: S1 & S2 heard, tachycardia.  Gastrointestinal system: Abdomen is soft NT ND BS+ Central nervous system: Alert and oriented. No focal neurological deficits. Extremities: Symmetric 5 x 5 power. Skin: No rashes, lesions or ulcers Psychiatry: Mood & affect appropriate.     Data Reviewed: I have personally reviewed following labs and imaging studies  CBC: Recent Labs  Lab 11/28/18 2135 11/28/18 2232 11/29/18 1322 11/30/18 1045 12/02/18 0537  WBC 9.0  --  9.2  --  9.7  NEUTROABS 6.0  --   --   --   --   HGB 7.5* 8.5* 9.3* 9.9* 8.9*  HCT 28.8* 25.0* 33.0* 34.6* 31.2*  MCV 68.7*  --  70.5*  --  70.6*  PLT 538*  --  486*  --  811*   Basic Metabolic Panel: Recent Labs  Lab 11/28/18 2135 11/28/18 2232 11/29/18 0333 12/02/18 1137 12/02/18 1223  NA 141 142 139 136  --   K 3.9 3.9 4.2 3.9  --   CL 109 108 109 105  --  CO2 24  --  24 24  --   GLUCOSE 77 80 106* 80  --   BUN 7 8 14 9   --   CREATININE 1.07* 1.00 0.85 0.87  --   CALCIUM 8.7*  --  8.5* 8.6*  --   MG  --   --   --   --  2.1  PHOS  --   --   --   --  3.6   GFR: Estimated Creatinine Clearance: 97.8 mL/min (by C-G formula based on SCr of 0.87 mg/dL). Liver Function Tests: No results for input(s): AST, ALT, ALKPHOS, BILITOT, PROT, ALBUMIN in the last 168 hours. No results for input(s): LIPASE, AMYLASE in the last 168 hours. No results for input(s): AMMONIA in the last 168 hours. Coagulation Profile: No results for input(s): INR, PROTIME in the last 168  hours. Cardiac Enzymes: Recent Labs  Lab 12/02/18 1137  TROPONINI <0.03   BNP (last 3 results) No results for input(s): PROBNP in the last 8760 hours. HbA1C: Recent Labs    11/30/18 1045  HGBA1C 5.2   CBG: No results for input(s): GLUCAP in the last 168 hours. Lipid Profile: No results for input(s): CHOL, HDL, LDLCALC, TRIG, CHOLHDL, LDLDIRECT in the last 72 hours. Thyroid Function Tests: No results for input(s): TSH, T4TOTAL, FREET4, T3FREE, THYROIDAB in the last 72 hours. Anemia Panel: No results for input(s): VITAMINB12, FOLATE, FERRITIN, TIBC, IRON, RETICCTPCT in the last 72 hours. Sepsis Labs: No results for input(s): PROCALCITON, LATICACIDVEN in the last 168 hours.  No results found for this or any previous visit (from the past 240 hour(s)).       Radiology Studies: No results found.      Scheduled Meds: . enoxaparin (LOVENOX) injection  40 mg Subcutaneous Daily  . escitalopram  20 mg Oral Daily  . folic acid  1 mg Oral Daily   Continuous Infusions: . sodium chloride 50 mL/hr at 12/02/18 0441     LOS: 2 days    Time spent: 25 minutes.     Hosie Poisson, MD [facility] Pager [phone]  If 7PM-7AM, please contact night-coverage [email or web address] Password [facility] 12/02/2018, 6:07 PM

## 2018-12-03 MED ORDER — FOLIC ACID 1 MG PO TABS: 1.0000 mg | ORAL_TABLET | Freq: Every day | ORAL | 0 refills | Status: DC

## 2018-12-03 MED ORDER — MECLIZINE HCL 25 MG PO TABS: 25.0000 mg | ORAL_TABLET | Freq: Two times a day (BID) | ORAL | 1 refills | Status: DC | PRN

## 2018-12-03 NOTE — Discharge Summary (Signed)
Physician Discharge Summary  Christina Mcbride ZOX:[id number] DOB: 04/01/1971 DOA: 11/28/2018  PCP: Alfonse Spruce, FNP  Admit date: 11/28/2018 Discharge date: 12/03/2018  Admitted From: Home.  Disposition: Home.   Recommendations for Outpatient Follow-up:  1. Follow up with PCP in 1-2 weeks 2. Please obtain BMP/CBC in one week 3. Please follow up with outpatient vestibular PT.  4. Please do not drive or operate vehicles until cleared by PCP.  5.     Discharge Condition:guarded.  CODE STATUS: full code.  Diet recommendation: Heart Healthy  Brief/Interim Summary: Christina Mcbride a 48 y.o.femalewithhistory of iron deficiency anemia admitted almost a year ago with complaint of headache at that time hemoglobin was found to be low and had iron deficiency anemia was transfused and discharged home over the last 1 month has been feeling dizziness on exertion mostly on standing.   Discharge Diagnoses:  Principal Problem:   Orthostatic hypotension Active Problems:   Symptomatic anemia   Dizziness, orthostatic hypotension:  Suspect from dehydration vs symptomatic anemia.  Hydrate with NS fluids and repeat orthostatic vital signs negative.  Since she was symptomatic with dizziness and tachycardia of 140's.  Echocardiogram ordered, it showed Normal LV systolic function; probable mild diastolic dysfunction. TSH wnl.  D dimer negative.  Since she has persistent symptoms of dizziness, feel like passing out, and tachycardia to 150/min  On ambulation, requested cardiology consult but she was found tohave positive symptoms on Epley's maneuver.  PT vestibular evaluation recommending outpatient vestibular PT. Some improvement with meclizine.  Pt was advised not to drive or operate vehicles until her dizziness and vertigo improved or resolved.  She was also recommended to follow upw ith cardiology for event monitor placement.    ? Iron deficiency anemia:  S/p 1 unit of prbc transfusion.   - IV iron infusion given. Stool for occult blood ordered and is negative.  Folate supplementation and iron supplementation recommended.    Discharge Instructions  Discharge Instructions    Ambulatory referral to Physical Therapy   Complete by:  As directed    Evaluation and treatment for vestibular therapy   Diet - low sodium heart healthy   Complete by:  As directed    Discharge instructions   Complete by:  As directed    PLEASE follow up with PCP in one week.     Allergies as of 12/03/2018      Reactions   Bee Venom Anaphylaxis      Medication List    STOP taking these medications   aspirin-acetaminophen-caffeine 250-250-65 MG tablet Commonly known as:  EXCEDRIN MIGRAINE   methocarbamol 500 MG tablet Commonly known as:  ROBAXIN   naproxen 500 MG tablet Commonly known as:  NAPROSYN     TAKE these medications   CALCIUM PO Take 500 mg by mouth daily.   docusate sodium 100 MG capsule Commonly known as:  COLACE Take 1 capsule (100 mg total) by mouth 2 (two) times daily as needed for mild constipation.   escitalopram 20 MG tablet Commonly known as:  LEXAPRO Take 1 tablet (20 mg total) by mouth daily. What changed:    medication strength  how much to take  additional instructions   ferrous sulfate 325 (65 FE) MG tablet Take 1 tablet (325 mg total) by mouth 3 (three) times daily with meals.   folic acid 1 MG tablet Commonly known as:  FOLVITE Take 1 tablet (1 mg total) by mouth daily. Start taking on:  December 04, 2018   meclizine  25 MG tablet Commonly known as:  ANTIVERT Take 1 tablet (25 mg total) by mouth 2 (two) times daily as needed for dizziness.   SUMAtriptan 50 MG tablet Commonly known as:  IMITREX Take 1 tablet (50 mg total) by mouth every 2 (two) hours as needed for migraine. May repeat in 2 hours if headache persists or recurs.   topiramate 50 MG tablet Commonly known as:  TOPAMAX Take 1 tablet (50 mg total) by mouth 2 (two) times daily.    traMADol 50 MG tablet Commonly known as:  ULTRAM Take 1 tablet (50 mg total) by mouth every 8 (eight) hours as needed. What changed:  reasons to take this      Follow-up Information    Alfonse Spruce, FNP. Schedule an appointment as soon as possible for a visit in 1 week(s).   Specialty:  Family Medicine Contact information: [place] [address] [phone]        [facility] Follow up in 1 week(s).   Specialty:  Cardiology Why:  event monitor.  Contact information: [address] [place] [facility].   Specialty:  Rehabilitation Why:  office will call you within 2-3 business days-if office does not call, then feel free to call the office for an appointment Contact information: [place] [id number] [place] [address] [phone]         Allergies  Allergen Reactions  . Bee Venom Anaphylaxis    Consultations:  Cardiology over the phone.    Procedures/Studies: Mr Brain Wo Contrast  Result Date: 12/02/2018 CLINICAL DATA:  Vertigo EXAM: MRI HEAD WITHOUT CONTRAST TECHNIQUE: Multiplanar, multiecho pulse sequences of the brain and surrounding structures were obtained without intravenous contrast. COMPARISON:  Head CT 06/28/2013 FINDINGS: BRAIN: There is no acute infarct, acute hemorrhage, hydrocephalus or extra-axial collection. Partially empty sella. There are no old infarcts. The white matter signal is normal for the patient's age. The cerebral and cerebellar volume are age-appropriate. Susceptibility-sensitive sequences show no chronic microhemorrhage or superficial siderosis. VASCULAR: Major intracranial arterial and venous sinus flow voids are normal. SKULL AND UPPER CERVICAL SPINE: Calvarial bone marrow signal is normal. There is no skull base mass. Visualized upper cervical  spine and soft tissues are normal. SINUSES/ORBITS: No fluid levels or advanced mucosal thickening. No mastoid or middle ear effusion. The orbits are normal. IMPRESSION: Normal brain MRI Electronically Signed   By: Ulyses Jarred M.D.   On: 12/02/2018 18:43   Dg Chest Port 1 View  Result Date: 11/28/2018 CLINICAL DATA:  Shortness of breath for 3 days. EXAM: PORTABLE CHEST 1 VIEW COMPARISON:  Lung bases from abdominal CT 03/01/2016 FINDINGS: Heart is normal in size. Rounded retrocardiac density represents hiatal hernia is seen on prior abdominal CT. No pulmonary edema, confluent airspace disease, pleural effusion or pneumothorax. IMPRESSION: 1. No acute chest findings. 2. Hiatal hernia. Electronically Signed   By: Keith Rake M.D.   On: 11/28/2018 22:53    Echocardiogram.    Subjective: DIZZINESS improved, heart rate improved.   Discharge Exam: Vitals:   12/03/18 0503 12/03/18 0929  BP: 117/70 129/77  Pulse: 72 81  Resp: 19 18  Temp: 98.6 F (37 C) 98.5 F (36.9 C)  SpO2: 99% 100%   Vitals:   12/02/18 0900 12/02/18 2130 12/03/18 0503 12/03/18 0929  BP: 115/69 130/88 117/70 129/77  Pulse: 82 87  72 81  Resp: 19 18 19 18   Temp:  98.9 F (37.2 C) 98.6 F (37 C) 98.5 F (36.9 C)  TempSrc:  Oral Oral Oral  SpO2: 96% 95% 99% 100%  Weight:  103.4 kg    Height:        General: Pt is alert, awake, not in acute distress Cardiovascular: RRR, S1/S2 +, no rubs, no gallops Respiratory: CTA bilaterally, no wheezing, no rhonchi Abdominal: Soft, NT, ND, bowel sounds + Extremities: no edema, no cyanosis    The results of significant diagnostics from this hospitalization (including imaging, microbiology, ancillary and laboratory) are listed below for reference.     Microbiology: No results found for this or any previous visit (from the past 240 hour(s)).   Labs: BNP (last 3 results) Recent Labs    11/28/18 2135  BNP 28.0   Basic Metabolic Panel: Recent Labs  Lab  11/28/18 2135 11/28/18 2232 11/29/18 0333 12/02/18 1137 12/02/18 1223  NA 141 142 139 136  --   K 3.9 3.9 4.2 3.9  --   CL 109 108 109 105  --   CO2 24  --  24 24  --   GLUCOSE 77 80 106* 80  --   BUN 7 8 14 9   --   CREATININE 1.07* 1.00 0.85 0.87  --   CALCIUM 8.7*  --  8.5* 8.6*  --   MG  --   --   --   --  2.1  PHOS  --   --   --   --  3.6   Liver Function Tests: No results for input(s): AST, ALT, ALKPHOS, BILITOT, PROT, ALBUMIN in the last 168 hours. No results for input(s): LIPASE, AMYLASE in the last 168 hours. No results for input(s): AMMONIA in the last 168 hours. CBC: Recent Labs  Lab 11/28/18 2135 11/28/18 2232 11/29/18 1322 11/30/18 1045 12/02/18 0537  WBC 9.0  --  9.2  --  9.7  NEUTROABS 6.0  --   --   --   --   HGB 7.5* 8.5* 9.3* 9.9* 8.9*  HCT 28.8* 25.0* 33.0* 34.6* 31.2*  MCV 68.7*  --  70.5*  --  70.6*  PLT 538*  --  486*  --  466*   Cardiac Enzymes: Recent Labs  Lab 12/02/18 1137  TROPONINI <0.03   BNP: Invalid input(s): POCBNP CBG: No results for input(s): GLUCAP in the last 168 hours. D-Dimer No results for input(s): DDIMER in the last 72 hours. Hgb A1c No results for input(s): HGBA1C in the last 72 hours. Lipid Profile No results for input(s): CHOL, HDL, LDLCALC, TRIG, CHOLHDL, LDLDIRECT in the last 72 hours. Thyroid function studies No results for input(s): TSH, T4TOTAL, T3FREE, THYROIDAB in the last 72 hours.  Invalid input(s): FREET3 Anemia work up No results for input(s): VITAMINB12, FOLATE, FERRITIN, TIBC, IRON, RETICCTPCT in the last 72 hours. Urinalysis    Component Value Date/Time   COLORURINE YELLOW 11/29/2018 0215   APPEARANCEUR HAZY (A) 11/29/2018 0215   LABSPEC 1.025 11/29/2018 0215   PHURINE 5.0 11/29/2018 0215   GLUCOSEU NEGATIVE 11/29/2018 0215   HGBUR SMALL (A) 11/29/2018 0215   BILIRUBINUR NEGATIVE 11/29/2018 0215   KETONESUR NEGATIVE 11/29/2018 0215   PROTEINUR NEGATIVE 11/29/2018 0215   UROBILINOGEN 0.2  07/10/2015 1115   NITRITE NEGATIVE 11/29/2018 0215   LEUKOCYTESUR NEGATIVE 11/29/2018 0215   Sepsis Labs Invalid input(s): PROCALCITONIN,  WBC,  LACTICIDVEN Microbiology No results found for this or any previous visit (  from the past 240 hour(s)).   Time coordinating discharge: 34 minutes  SIGNED:   Hosie Poisson, MD  [facility] 12/03/2018, 8:52 PM Pager   If 7PM-7AM, please contact night-coverage [email or web address] Password [email or web address]

## 2018-12-03 NOTE — Progress Notes (Signed)
Physical Therapy Treatment Patient Details Name: Christina Mcbride MRN: [id number] DOB: 07/17/1971 Today's Date: 12/03/2018    History of Present Illness Pt is a 48 y/o female who presents with 1 month history of dizziness on exertion - mostly on standing. Pt reports occasional palpitations and SOB as well. PMH significant for thyroid disease, PTSD, tachycardia, migraine, anemia.    PT Comments    Pt reports feeling the same as yesterday in regards to dizziness. Overall pt appears more uncomfortable and emotional today, and pt states she is trying to get home to her sons as soon as possible as they are alone and taking care of themselves right now (ages 61 and 81). Fiance' lives out of state and is not able to travel back to [place] right now. Retested R posterior canal which elicited symptoms, and performed Epley maneuver without change in symptoms. No nystagmus noted throughout session. Pt was instructed in gaze stabilization as well as x1 exercises. Handout provided. Continue to feel that pt will benefit from outpatient vestibular PT follow-up at d/c.       Follow Up Recommendations  Outpatient PT(for Vestibular follow-up)     Equipment Recommendations  None recommended by PT    Recommendations for Other Services       Precautions / Restrictions Precautions Precautions: Fall Restrictions Weight Bearing Restrictions: No    Mobility  Bed Mobility Overal bed mobility: Modified Independent                Transfers Overall transfer level: Modified independent Equipment used: None             General transfer comment: Increased time due to dizziness.  Ambulation/Gait Ambulation/Gait assistance: Supervision Gait Distance (Feet): 200 Feet Assistive device: None Gait Pattern/deviations: Step-through pattern;Decreased stride length Gait velocity: Decreased Gait velocity interpretation: 1.31 - 2.62 ft/sec, indicative of limited community ambulator General Gait Details: Slow  and guarded. Pt was instructed in gaze stabilization which she states improved symptoms.    Stairs             Wheelchair Mobility    Modified Rankin (Stroke Patients Only)       Balance Overall balance assessment: Needs assistance Sitting-balance support: Feet supported;No upper extremity supported Sitting balance-Leahy Scale: Good     Standing balance support: No upper extremity supported;During functional activity Standing balance-Leahy Scale: Fair Standing balance comment: Guarded due to dizziness                            Cognition Arousal/Alertness: Awake/alert Behavior During Therapy: WFL for tasks assessed/performed Overall Cognitive Status: Within Functional Limits for tasks assessed                                        Exercises      General Comments        Pertinent Vitals/Pain Pain Assessment: No/denies pain    Home Living                      Prior Function            PT Goals (current goals can now be found in the care plan section) Acute Rehab PT Goals Patient Stated Goal: Get home to her sons PT Goal Formulation: With patient Time For Goal Achievement: 12/09/18 Potential to Achieve Goals: Good Progress towards PT goals: Progressing  toward goals    Frequency    Min 3X/week      PT Plan Current plan remains appropriate    Co-evaluation              AM-PAC PT "6 Clicks" Mobility   Outcome Measure  Help needed turning from your back to your side while in a flat bed without using bedrails?: None Help needed moving from lying on your back to sitting on the side of a flat bed without using bedrails?: None Help needed moving to and from a bed to a chair (including a wheelchair)?: None Help needed standing up from a chair using your arms (e.g., wheelchair or bedside chair)?: None Help needed to walk in hospital room?: A Little Help needed climbing 3-5 steps with a railing? : A Little 6  Click Score: 22    End of Session Equipment Utilized During Treatment: Gait belt Activity Tolerance: Patient tolerated treatment well Patient left: in chair;with call bell/phone within reach Nurse Communication: Mobility status PT Visit Diagnosis: Dizziness and giddiness (R42);BPPV BPPV - Right/Left : Right     Time: 2355-7322 PT Time Calculation (min) (ACUTE ONLY): 46 min  Charges:  $Gait Training: 8-22 mins $Therapeutic Exercise: 8-22 mins $Canalith Rep Proc: 8-22 mins                     Rolinda Roan, PT, DPT Acute Rehabilitation Services Pager: [phone] Office: [phone]    Thelma Comp 12/03/2018, 9:09 AM

## 2018-12-03 NOTE — Progress Notes (Signed)
Patient Discharge: Disposition: Patient discharged to home. Education: Reviewed medications, prescriptions, discharge instructions, and follow-up appointments, verbalized  Understanding. IV: Discontinued IV before discharge. Telemetry: Discontinued Tele before discharge. Transportation: Patient escorted out of the unit in w/c. Belongings: She took all her belongings with her.

## 2019-03-19 ENCOUNTER — Other Ambulatory Visit: Payer: Self-pay

## 2019-03-19 ENCOUNTER — Encounter: Payer: Self-pay | Admitting: Primary Care

## 2019-03-19 ENCOUNTER — Ambulatory Visit: Payer: Medicaid Other | Attending: Primary Care | Admitting: Primary Care

## 2019-03-19 VITALS — BP 129/76 | HR 89 | Temp 98.2°F | Resp 18 | Ht 67.0 in | Wt 251.0 lb

## 2019-03-19 DIAGNOSIS — R42 Dizziness and giddiness: Secondary | ICD-10-CM | POA: Insufficient documentation

## 2019-03-19 DIAGNOSIS — G43101 Migraine with aura, not intractable, with status migrainosus: Secondary | ICD-10-CM | POA: Diagnosis not present

## 2019-03-19 DIAGNOSIS — Z809 Family history of malignant neoplasm, unspecified: Secondary | ICD-10-CM | POA: Insufficient documentation

## 2019-03-19 DIAGNOSIS — D649 Anemia, unspecified: Secondary | ICD-10-CM | POA: Insufficient documentation

## 2019-03-19 DIAGNOSIS — Z7689 Persons encountering health services in other specified circumstances: Secondary | ICD-10-CM | POA: Diagnosis not present

## 2019-03-19 DIAGNOSIS — Z6841 Body Mass Index (BMI) 40.0 and over, adult: Secondary | ICD-10-CM | POA: Insufficient documentation

## 2019-03-19 DIAGNOSIS — Z833 Family history of diabetes mellitus: Secondary | ICD-10-CM | POA: Insufficient documentation

## 2019-03-19 DIAGNOSIS — I951 Orthostatic hypotension: Secondary | ICD-10-CM | POA: Insufficient documentation

## 2019-03-19 DIAGNOSIS — Z91030 Bee allergy status: Secondary | ICD-10-CM | POA: Insufficient documentation

## 2019-03-19 DIAGNOSIS — Z79899 Other long term (current) drug therapy: Secondary | ICD-10-CM | POA: Diagnosis not present

## 2019-03-19 DIAGNOSIS — R000 Tachycardia, unspecified: Secondary | ICD-10-CM | POA: Insufficient documentation

## 2019-03-19 DIAGNOSIS — M25552 Pain in left hip: Secondary | ICD-10-CM | POA: Diagnosis not present

## 2019-03-19 DIAGNOSIS — F4310 Post-traumatic stress disorder, unspecified: Secondary | ICD-10-CM | POA: Diagnosis not present

## 2019-03-19 DIAGNOSIS — D509 Iron deficiency anemia, unspecified: Secondary | ICD-10-CM | POA: Insufficient documentation

## 2019-03-19 NOTE — Progress Notes (Signed)
Acute Office Visit  Subjective:    Patient ID: Christina Mcbride, female    DOB: 17-Sep-1971, 48 y.o.   MRN: [id number]  Chief Complaint  Patient presents with  . Establish Care    HPI Patient is in today to establish care and admitted for orthostatic hypotension. She has a past medical history of iron deficiency anemia requiring blood transfusions, migraine, PTSD, and thyroid disease. She has a unsteady gait and c/oleft hip pain.  Past Medical History:  Diagnosis Date  . [place] 11/2018  . Anemia   . Blood transfusion without reported diagnosis   . Migraine   . PTSD (post-traumatic stress disorder)    reports daughter was murdered   . Tachycardia   . Thyroid disease   . Vitamin D deficiency     Past Surgical History:  Procedure Laterality Date  . TUBAL LIGATION      Family History  Problem Relation Age of Onset  . Hypertension Mother   . Arthritis Mother   . Hypertension Son   . Cancer Maternal Aunt   . Diabetes Maternal Grandmother     Social History   Socioeconomic History  . Marital status: Single    Spouse name: Not on file  . Number of children: Not on file  . Years of education: Not on file  . Highest education level: Not on file  Occupational History  . Not on file  Social Needs  . Financial resource strain: Not on file  . Food insecurity:    Worry: Not on file    Inability: Not on file  . Transportation needs:    Medical: Not on file    Non-medical: Not on file  Tobacco Use  . Smoking status: Never Smoker  . Smokeless tobacco: Never Used  Substance and Sexual Activity  . Alcohol use: No  . Drug use: No  . Sexual activity: Not Currently    Birth control/protection: Surgical  Lifestyle  . Physical activity:    Days per week: Not on file    Minutes per session: Not on file  . Stress: Not on file  Relationships  . Social connections:    Talks on phone: Not on file    Gets together: Not on file    Attends religious service: Not on file   Active member of club or organization: Not on file    Attends meetings of clubs or organizations: Not on file    Relationship status: Not on file  . Intimate partner violence:    Fear of current or ex partner: Not on file    Emotionally abused: Not on file    Physically abused: Not on file    Forced sexual activity: Not on file  Other Topics Concern  . Not on file  Social History Narrative   Pt relocated to [place] from [place] around 2012    Outpatient Medications Prior to Visit  Medication Sig Dispense Refill  . CALCIUM PO Take 500 mg by mouth daily.    Marland Kitchen docusate sodium (COLACE) 100 MG capsule Take 1 capsule (100 mg total) by mouth 2 (two) times daily as needed for mild constipation. 10 capsule 0  . escitalopram (LEXAPRO) 20 MG tablet Take 1 tablet (20 mg total) by mouth daily. 30 tablet 0  . ferrous sulfate 325 (65 FE) MG tablet Take 1 tablet (325 mg total) by mouth 3 (three) times daily with meals. 90 tablet 2  . folic acid (FOLVITE) 1 MG tablet Take 1 tablet (  1 mg total) by mouth daily. 30 tablet 0  . meclizine (ANTIVERT) 25 MG tablet Take 1 tablet (25 mg total) by mouth 2 (two) times daily as needed for dizziness. 30 tablet 1  . SUMAtriptan (IMITREX) 50 MG tablet Take 1 tablet (50 mg total) by mouth every 2 (two) hours as needed for migraine. May repeat in 2 hours if headache persists or recurs. 10 tablet 0  . topiramate (TOPAMAX) 50 MG tablet Take 1 tablet (50 mg total) by mouth 2 (two) times daily. 60 tablet 0  . traMADol (ULTRAM) 50 MG tablet Take 1 tablet (50 mg total) by mouth every 8 (eight) hours as needed. (Patient taking differently: Take 50 mg by mouth every 8 (eight) hours as needed (for pain). ) 30 tablet 0   No facility-administered medications prior to visit.     Allergies  Allergen Reactions  . Bee Venom Anaphylaxis    Review of Systems  Constitutional: Positive for malaise/fatigue.  Eyes: Positive for blurred vision.  Respiratory: Positive for  cough and shortness of breath.   Cardiovascular: Positive for chest pain and leg swelling.       Orthostatic HTN  Gastrointestinal: Negative.   Genitourinary: Positive for frequency and urgency.  Musculoskeletal:       Left hip pain  Neurological: Positive for dizziness and headaches.  Endo/Heme/Allergies: Positive for environmental allergies.       Seasonal allergies  Psychiatric/Behavioral: The patient has insomnia.        Objective:    Physical Exam  Constitutional: She is oriented to person, place, and time. She appears well-developed and well-nourished.  HENT:  Head: Normocephalic and atraumatic.  Eyes: EOM are normal.  Neck: Normal range of motion. Neck supple.  Pulmonary/Chest: She exhibits tenderness.  Abdominal: Soft. Bowel sounds are normal. She exhibits distension.  Musculoskeletal:        General: Tenderness and deformity present.  Neurological: She is oriented to person, place, and time.  Skin: Skin is warm and dry.  Psychiatric: She has a normal mood and affect.    BP 129/76 (BP Location: Left Arm, Patient Position: Sitting, Cuff Size: Large)   Pulse 89   Temp 98.2 F (36.8 C) (Oral)   Resp 18   Ht 5\' 7"  (1.702 m)   Wt 251 lb (113.9 kg)   LMP 05/04/2016   SpO2 100%   BMI 39.31 kg/m  Wt Readings from Last 3 Encounters:  03/19/19 251 lb (113.9 kg)  12/02/18 227 lb 15.3 oz (103.4 kg)  05/29/18 234 lb (106.1 kg)    Health Maintenance Due  Topic Date Due  . PAP SMEAR-Modifier  07/10/1991    There are no preventive care reminders to display for this patient.   Lab Results  Component Value Date   TSH 0.984 11/28/2018   Lab Results  Component Value Date   WBC 9.7 12/02/2018   HGB 8.9 (L) 12/02/2018   HCT 31.2 (L) 12/02/2018   MCV 70.6 (L) 12/02/2018   PLT 466 (H) 12/02/2018   Lab Results  Component Value Date   NA 136 12/02/2018   K 3.9 12/02/2018   CO2 24 12/02/2018   GLUCOSE 80 12/02/2018   BUN 9 12/02/2018   CREATININE 0.87  12/02/2018   BILITOT 0.3 05/15/2018   ALKPHOS 89 05/15/2018   AST 61 (H) 05/15/2018   ALT 49 05/15/2018   PROT 8.3 (H) 05/15/2018   ALBUMIN 3.5 05/15/2018   CALCIUM 8.6 (L) 12/02/2018   ANIONGAP 7 12/02/2018  Lab Results  Component Value Date   CHOL 181 12/26/2013   Lab Results  Component Value Date   HDL 57 12/26/2013   Lab Results  Component Value Date   LDLCALC 104 (H) 12/26/2013   Lab Results  Component Value Date   TRIG 102 12/26/2013   Lab Results  Component Value Date   CHOLHDL 3.2 12/26/2013   Lab Results  Component Value Date   HGBA1C 5.2 11/30/2018       Assessment & Plan:   Problem List Items Addressed This Visit    None     Christina Mcbride was seen today for establish care.  Diagnoses and all orders for this visit:  Encounter to establish care/hospital discharge  New dx orthostatic hypotension she went to the ED for lightheadedness and headache.  Iron deficiency anemia, unspecified iron deficiency anemia type Tachycardia Will do a full panel blood work up  Migraine with aura and with status migrainosus, not intractable Followed by neurologist   PTSD (post-traumatic stress disorder) Started on lexapro and received a 30 day supply and no refills .   Left hip pain Will refer to ortho. Patient understands that do to the COVID-19 scheduling maybe delayed. Sciatica is involved and crepitus when raised left leg 45 degree pain radiated from back down to her leg.   Morbid obesity with BMI of 40.0-44.9, adult (HCC)  Orthostatic hypotension orthostatic dizziness and weakness, found to have orthostatic changes consisting of significant increase in heart rate in ED to admission new dx. EKG sinus tachycardia borderline T abnormalities, diffused leads 11/28/2018     No orders of the defined types were placed in this encounter.    Kerin Perna, NP

## 2019-06-18 ENCOUNTER — Other Ambulatory Visit: Payer: Self-pay

## 2019-06-18 ENCOUNTER — Telehealth (INDEPENDENT_AMBULATORY_CARE_PROVIDER_SITE_OTHER): Payer: Medicaid Other | Admitting: Primary Care

## 2019-06-18 DIAGNOSIS — F32A Depression, unspecified: Secondary | ICD-10-CM

## 2019-06-18 DIAGNOSIS — D500 Iron deficiency anemia secondary to blood loss (chronic): Secondary | ICD-10-CM | POA: Diagnosis not present

## 2019-06-18 DIAGNOSIS — F329 Major depressive disorder, single episode, unspecified: Secondary | ICD-10-CM

## 2019-06-18 DIAGNOSIS — Z780 Asymptomatic menopausal state: Secondary | ICD-10-CM

## 2019-06-18 DIAGNOSIS — D508 Other iron deficiency anemias: Secondary | ICD-10-CM

## 2019-06-18 DIAGNOSIS — G43C0 Periodic headache syndromes in child or adult, not intractable: Secondary | ICD-10-CM | POA: Diagnosis not present

## 2019-06-18 MED ORDER — SUMATRIPTAN SUCCINATE 50 MG PO TABS: 50.0000 mg | ORAL_TABLET | ORAL | 0 refills | Status: DC | PRN

## 2019-06-18 MED ORDER — ESCITALOPRAM OXALATE 20 MG PO TABS: 20.0000 mg | ORAL_TABLET | Freq: Every day | ORAL | 3 refills | Status: DC

## 2019-06-18 MED ORDER — FERROUS SULFATE 325 (65 FE) MG PO TABS: 325.0000 mg | ORAL_TABLET | Freq: Three times a day (TID) | ORAL | 2 refills | Status: DC

## 2019-06-18 MED ORDER — MECLIZINE HCL 25 MG PO TABS: 25.0000 mg | ORAL_TABLET | Freq: Two times a day (BID) | ORAL | 1 refills | Status: DC | PRN

## 2019-06-18 NOTE — Progress Notes (Signed)
Virtual Visit via Telephone Note  I connected with Christina Mcbride on 06/18/19 at  2:50 PM EDT by telephone and verified that I am speaking with the correct person using two identifiers.   I discussed the limitations, risks, security and privacy concerns of performing an evaluation and management service by telephone and the availability of in person appointments. I also discussed with the patient that there may be a patient responsible charge related to this service. The patient expressed understanding and agreed to proceed.   History of Present Illness: [name] is having a telemetry visit for the management of chronic disease and requesting medication refill. She has a significant past medical history of anemia which also are symptoms she describes during this visit.  Patient will have CBC done as soon as possible.  Also requesting medication refills.  Past Medical History:  Diagnosis Date  . Am[place] 11/2018  . Anemia   . Blood transfusion without reported diagnosis   . Migraine   . PTSD (post-traumatic stress disorder)    reports daughter was murdered   . Tachycardia   . Thyroid disease   . Vitamin D deficiency      Observations/Objective: Review of Systems  Constitutional: Positive for malaise/fatigue.  Cardiovascular: Positive for palpitations and orthopnea.  Musculoskeletal:       Bilateral leg pain- unsteady gait Left shoulder   Neurological: Positive for dizziness, weakness and headaches.    Assessment and Plan: Diagnoses and all orders for this visit:  Other iron deficiency anemia Iron rich foods such as shellfish,liver, organ meats(liver, gizzard), and red meats can increase cholesterol and should be consumed in moderation.However; legumes(beans), spinach, pumpkin seeds, [place], broccoli, tofu, green leafy vegetables and dark chocolate can be consumed without concern to cholesterol.   ferrous sulfate 325 (65 FE) MG tablet; Take 1 tablet (325 mg total) by mouth  3 (three) times daily with meals.  Menopause Menopause is defined as absent of a menstrual cycle for 1 year   Iron deficiency anemia due to chronic blood loss -     ferrous sulfate 325 (65 FE) MG tablet; Take 1 tablet (325 mg total) by mouth 3 (three) times daily with meals.  Periodic headache syndrome, not intractable Signs can be individualized with throbbing pain on any area of the head.  They may come with an aura-dizziness, nausea, sensitive to light sound or smell.  Triggers can be caused by drinking alcohol smoking or certain medications, eating and drinking certain products  This condition may be triggered or caused by: Caffeine, aged cheese, and chocolate.  Depression Depression is when you feel down, blue or sad for at least 2 weeks in a row. You may experience increaset increase in sleeping, eating or  loss of interest in doing things that once gave you pleasure. Feeling worthless, guilty, nervous and low self esteem, avoiding interaction with other people or increase agitation.   Other orders -     SUMAtriptan (IMITREX) 50 MG tablet; Take 1 tablet (50 mg total) by mouth every 2 (two) hours as needed for migraine. May repeat in 2 hours if headache persists or recurs. -     meclizine (ANTIVERT) 25 MG tablet; Take 1 tablet (25 mg total) by mouth 2 (two) times daily as needed for dizziness. -     escitalopram (LEXAPRO) 20 MG tablet; Take 1 tablet (20 mg total) by mouth daily. -     folic acid (FOLVITE) 1 MG tablet; Take 1 tablet (1 mg total) by mouth  daily.  Follow Up Instructions:    I discussed the assessment and treatment plan with the patient. The patient was provided an opportunity to ask questions and all were answered. The patient agreed with the plan and demonstrated an understanding of the instructions.   The patient was advised to call back or seek an in-person evaluation if the symptoms worsen or if the condition fails to improve as anticipated.  I provided 18 minutes  of non-face-to-face time during this encounter.   Kerin Perna, NP

## 2019-06-19 MED ORDER — FOLIC ACID 1 MG PO TABS: 1.0000 mg | ORAL_TABLET | Freq: Every day | ORAL | 3 refills | Status: DC

## 2019-07-19 ENCOUNTER — Encounter (HOSPITAL_COMMUNITY): Payer: Self-pay

## 2019-07-19 ENCOUNTER — Other Ambulatory Visit: Payer: Self-pay

## 2019-07-19 ENCOUNTER — Observation Stay (HOSPITAL_COMMUNITY)
Admission: EM | Admit: 2019-07-19 | Discharge: 2019-07-20 | Disposition: A | Discharge status: Home / Self Care | Payer: Medicare Other | Attending: Internal Medicine | Admitting: Internal Medicine

## 2019-07-19 ENCOUNTER — Emergency Department (HOSPITAL_COMMUNITY): Payer: Medicare Other

## 2019-07-19 DIAGNOSIS — D509 Iron deficiency anemia, unspecified: Secondary | ICD-10-CM | POA: Insufficient documentation

## 2019-07-19 DIAGNOSIS — E559 Vitamin D deficiency, unspecified: Secondary | ICD-10-CM | POA: Insufficient documentation

## 2019-07-19 DIAGNOSIS — Z0184 Encounter for antibody response examination: Secondary | ICD-10-CM | POA: Insufficient documentation

## 2019-07-19 DIAGNOSIS — Z20828 Contact with and (suspected) exposure to other viral communicable diseases: Secondary | ICD-10-CM | POA: Insufficient documentation

## 2019-07-19 DIAGNOSIS — G43909 Migraine, unspecified, not intractable, without status migrainosus: Secondary | ICD-10-CM | POA: Diagnosis not present

## 2019-07-19 DIAGNOSIS — Z6839 Body mass index (BMI) 39.0-39.9, adult: Secondary | ICD-10-CM | POA: Diagnosis not present

## 2019-07-19 DIAGNOSIS — R002 Palpitations: Principal | ICD-10-CM | POA: Insufficient documentation

## 2019-07-19 DIAGNOSIS — F4310 Post-traumatic stress disorder, unspecified: Secondary | ICD-10-CM | POA: Diagnosis not present

## 2019-07-19 DIAGNOSIS — D649 Anemia, unspecified: Secondary | ICD-10-CM | POA: Diagnosis not present

## 2019-07-19 DIAGNOSIS — K219 Gastro-esophageal reflux disease without esophagitis: Secondary | ICD-10-CM | POA: Insufficient documentation

## 2019-07-19 DIAGNOSIS — K449 Diaphragmatic hernia without obstruction or gangrene: Secondary | ICD-10-CM | POA: Diagnosis not present

## 2019-07-19 DIAGNOSIS — Z79899 Other long term (current) drug therapy: Secondary | ICD-10-CM | POA: Diagnosis not present

## 2019-07-19 DIAGNOSIS — E079 Disorder of thyroid, unspecified: Secondary | ICD-10-CM | POA: Diagnosis not present

## 2019-07-19 LAB — BASIC METABOLIC PANEL
Anion gap: 9 (ref 5–15)
BUN: 9 mg/dL (ref 6–20)
CO2: 23 mmol/L (ref 22–32)
Calcium: 8.7 mg/dL — ABNORMAL LOW (ref 8.9–10.3)
Chloride: 105 mmol/L (ref 98–111)
Creatinine, Ser: 0.86 mg/dL (ref 0.44–1.00)
GFR calc Af Amer: >60 mL/min (ref >60)
GFR calc non Af Amer: >60 mL/min (ref >60)
Glucose, Bld: 104 mg/dL — ABNORMAL HIGH (ref 70–99)
Potassium: 4 mmol/L (ref 3.5–5.1)
Sodium: 137 mmol/L (ref 135–145)

## 2019-07-19 LAB — CBC
HCT: 20 % — ABNORMAL LOW (ref 36.0–46.0)
Hemoglobin: 5.3 g/dL — CL (ref 12.0–15.0)
MCH: 17.4 pg — ABNORMAL LOW (ref 26.0–34.0)
MCHC: 26.5 g/dL — ABNORMAL LOW (ref 30.0–36.0)
MCV: 65.8 fL — ABNORMAL LOW (ref 80.0–100.0)
Platelets: 582 10*3/uL — ABNORMAL HIGH (ref 150–400)
RBC: 3.04 MIL/uL — ABNORMAL LOW (ref 3.87–5.11)
RDW: 21.5 % — ABNORMAL HIGH (ref 11.5–15.5)
WBC: 10.5 10*3/uL (ref 4.0–10.5)
nRBC: 1.1 % — ABNORMAL HIGH (ref 0.0–0.2)

## 2019-07-19 LAB — I-STAT BETA HCG BLOOD, ED (MC, WL, AP ONLY): I-stat hCG, quantitative: <5 m[IU]/mL (ref <5)

## 2019-07-19 LAB — PREPARE RBC (CROSSMATCH)

## 2019-07-19 MED ORDER — TRAMADOL HCL 50 MG PO TABS: 50.0000 mg | ORAL_TABLET | Freq: Three times a day (TID) | ORAL | Status: DC | PRN

## 2019-07-19 MED ORDER — SODIUM CHLORIDE 0.9 % IV SOLN: 10.0000 mL/h | Freq: Once | INTRAVENOUS | Status: DC

## 2019-07-19 MED ORDER — FOLIC ACID 1 MG PO TABS
1.0000 mg | ORAL_TABLET | Freq: Every day | ORAL | Status: DC
  Administered 2019-07-20: 1 mg via ORAL
  Filled 2019-07-19: qty 1

## 2019-07-19 MED ORDER — SODIUM CHLORIDE 0.9 % IV SOLN
510.0000 mg | Freq: Once | INTRAVENOUS | Status: AC
  Administered 2019-07-20: 510 mg via INTRAVENOUS
  Filled 2019-07-19: qty 17

## 2019-07-19 MED ORDER — SODIUM CHLORIDE 0.9% FLUSH: 3.0000 mL | Freq: Once | INTRAVENOUS | Status: DC

## 2019-07-19 MED ORDER — ESCITALOPRAM OXALATE 10 MG PO TABS
20.0000 mg | ORAL_TABLET | Freq: Every day | ORAL | Status: DC
  Administered 2019-07-20: 20 mg via ORAL
  Filled 2019-07-19: qty 2

## 2019-07-19 MED ORDER — FERROUS SULFATE 325 (65 FE) MG PO TABS
325.0000 mg | ORAL_TABLET | Freq: Three times a day (TID) | ORAL | Status: DC
  Administered 2019-07-20 (×2): 325 mg via ORAL
  Filled 2019-07-19 (×2): qty 1

## 2019-07-19 NOTE — ED Notes (Signed)
ED TO INPATIENT HANDOFF REPORT  ED Nurse Name and Phone #: Almyra Free [phone]  S Name/Age/Gender Christina Mcbride 48 y.o. female Room/Bed: 043C/043C  Code Status   Code Status: Prior  Home/SNF/Other Home Patient oriented to: self, place, time and situation Is this baseline? Yes   Triage Complete: Triage complete  Chief Complaint fast HR  Triage Note Pt presents to ED with concerns for a fast heart rate for "a while" but she noticed a weird noise starting today   Allergies Allergies  Allergen Reactions  . Bee Venom Anaphylaxis    Level of Care/Admitting Diagnosis ED Disposition    ED Disposition Condition [facility] Area: [place] [[id number]]  Level of Care: Telemetry Medical [104]  I expect the patient will be discharged within 24 hours: No (not a candidate for 5C-Observation unit)  Covid Evaluation: Asymptomatic Screening Protocol (No Symptoms)  Diagnosis: Symptomatic anemia [id number]  Admitting Physician: Bonnell Public [[id number]]  Attending Physician: Dana Allan I [[id number]]  PT Class (Do Not Modify): Observation [104]  PT Acc Code (Do Not Modify): Observation [10022]       B Medical/Surgery History Past Medical History:  Diagnosis Date  . Am[place] 11/2018  . Anemia   . Blood transfusion without reported diagnosis   . Migraine   . PTSD (post-traumatic stress disorder)    reports daughter was murdered   . Tachycardia   . Thyroid disease   . Vitamin D deficiency    Past Surgical History:  Procedure Laterality Date  . TUBAL LIGATION       A IV Location/Drains/Wounds Patient Lines/Drains/Airways Status   Active Line/Drains/Airways    Name:   Placement date:   Placement time:   Site:   Days:   Peripheral IV 07/19/19 Left;Anterior Forearm   07/19/19    2031    Forearm   less than 1          Intake/Output Last 24 hours  Intake/Output Summary (Last 24 hours) at 07/19/2019 2110 Last data filed at 07/19/2019 2100 Gross  per 24 hour  Intake 315 ml  Output -  Net 315 ml    Labs/Imaging Results for orders placed or performed during the hospital encounter of 07/19/19 (from the past 48 hour(s))  Basic metabolic panel     Status: Abnormal   Collection Time: 07/19/19  5:17 PM  Result Value Ref Range   Sodium 137 135 - 145 mmol/L   Potassium 4.0 3.5 - 5.1 mmol/L   Chloride 105 98 - 111 mmol/L   CO2 23 22 - 32 mmol/L   Glucose, Bld 104 (H) 70 - 99 mg/dL   BUN 9 6 - 20 mg/dL   Creatinine, Ser 0.86 0.44 - 1.00 mg/dL   Calcium 8.7 (L) 8.9 - 10.3 mg/dL   GFR calc non Af Amer >60 >60 mL/min   GFR calc Af Amer >60 >60 mL/min   Anion gap 9 5 - 15    Comment: Performed at [facility], [place] [address], [place],  [address]  CBC     Status: Abnormal   Collection Time: 07/19/19  5:17 PM  Result Value Ref Range   WBC 10.5 4.0 - 10.5 K/uL   RBC 3.04 (L) 3.87 - 5.11 MIL/uL   Hemoglobin 5.3 (LL) 12.0 - 15.0 g/dL    Comment: REPEATED TO VERIFY Reticulocyte Hemoglobin testing may be clinically indicated, consider ordering this additional test [id number] THIS CRITICAL RESULT HAS VERIFIED AND BEEN CALLED  TO RN A SANCHEZ BY LESLIE BENFIELD ON 08 22 2020 AT [id number], AND HAS BEEN READ BACK.     HCT 20.0 (L) 36.0 - 46.0 %   MCV 65.8 (L) 80.0 - 100.0 fL   MCH 17.4 (L) 26.0 - 34.0 pg   MCHC 26.5 (L) 30.0 - 36.0 g/dL   RDW 21.5 (H) 11.5 - 15.5 %   Platelets 582 (H) 150 - 400 K/uL   nRBC 1.1 (H) 0.0 - 0.2 %    Comment: Performed at [facility] [address], [place], [place] [address]  I-Stat beta hCG blood, ED     Status: None   Collection Time: 07/19/19  5:37 PM  Result Value Ref Range   I-stat hCG, quantitative <5.0 <5 mIU/mL   Comment 3            Comment:   GEST. AGE      CONC.  (mIU/mL)   <=1 WEEK        5 - 50     2 WEEKS       50 - 500     3 WEEKS       100 - 10,000     4 WEEKS     1,000 - 30,000        FEMALE AND NON-PREGNANT FEMALE:     LESS THAN 5 mIU/mL   Type and screen [place]     Status: None (Preliminary result)   Collection Time: 07/19/19  6:08 PM  Result Value Ref Range   ABO/RH(D) B POS    Antibody Screen NEG    Sample Expiration 07/22/2019,2359    Unit Number [id number]    Blood Component Type RED CELLS,LR    Unit division 00    Status of Unit ISSUED    Transfusion Status OK TO TRANSFUSE    Crossmatch Result      Compatible Performed at [facility], [place] [address] [place], [place] [address]    Unit Number [id number]    Blood Component Type RED CELLS,LR    Unit division 00    Status of Unit ALLOCATED    Transfusion Status OK TO TRANSFUSE    Crossmatch Result Compatible   Prepare RBC     Status: None   Collection Time: 07/19/19  7:00 PM  Result Value Ref Range   Order Confirmation      ORDER PROCESSED BY BLOOD BANK Performed at [facility], [place] [address] [place], [place] [address]    Dg Chest 2 View  Result Date: 07/19/2019 CLINICAL DATA:  Chest pain. EXAM: CHEST - 2 VIEW COMPARISON:  Radiograph 11/28/2018 abdominal CT 417 FINDINGS: The cardiomediastinal contours are normal. Retrocardiac hiatal hernia. Pulmonary vasculature is normal. No consolidation, pleural effusion, or pneumothorax. No acute osseous abnormalities are seen. IMPRESSION: Retrocardiac hiatal hernia. Otherwise negative radiograph of the chest. Electronically Signed   By: Keith Rake M.D.   On: 07/19/2019 19:30    Pending Labs Unresulted Labs (From admission, onward)    Start     Ordered   07/19/19 1755  SARS CORONAVIRUS 2 Nasal Swab Aptima Multi Swab  (Asymptomatic/Tier 2 Patients Labs)  Once,   STAT    Question Answer Comment  Is this test for diagnosis or screening Screening   Symptomatic for COVID-19 as defined by CDC No   Hospitalized for COVID-19 No   Admitted to ICU for COVID-19 No   Previously tested for COVID-19 No  Resident in a congregate (group) care setting No   Employed in healthcare setting No   Pregnant No       07/19/19 1754   Signed and Held  Hemoglobin  Now then every 8 hours,   R     Signed and Held   Signed and Held  Hematocrit  Now then every 8 hours,   R     Signed and Held   Signed and Held  TSH  Once,   R     Signed and Held   Signed and Occupational hygienist morning,   R     Signed and Held   Signed and Held  Folate RBC  Once,   R     Signed and Held   Signed and Held  Iron and TIBC  Once,   R     Signed and Held   Visual merchandiser and Held  Ferritin  Once,   R     Signed and Held   Signed and Held  Occult blood card to lab, stool  As needed,   R     Signed and Held          Vitals/Pain Today's Vitals   07/19/19 1708 07/19/19 1715 07/19/19 1941 07/19/19 2100  BP: 134/69   133/82  Pulse: 98   88  Resp: 16   14  Temp: 99.2 F (37.3 C)   99 F (37.2 C)  TempSrc: Oral   Oral  SpO2: 98%     Weight: 113.4 kg     Height: 5\' 7"  (1.702 m)     PainSc:  4  0-No pain     Isolation Precautions No active isolations  Medications Medications  sodium chloride flush (NS) 0.9 % injection 3 mL (has no administration in time range)  0.9 %  sodium chloride infusion (has no administration in time range)    Mobility walks Low fall risk   Focused Assessments    R Recommendations: See Admitting Provider Note  Report given to:   Additional Notes: pt has had multiple blood transfusions and iron transfusions before

## 2019-07-19 NOTE — ED Notes (Signed)
Per IV team, pt refused second IV

## 2019-07-19 NOTE — ED Provider Notes (Signed)
[facility] EMERGENCY DEPARTMENT Provider Note   CSN: [id number] Arrival date & time: 07/19/19  1700     History   Chief Complaint Chief Complaint  Patient presents with  . Tachycardia    HPI Christina Mcbride is a 48 y.o. female who presents with palpitations.  Past medical history significant for migraines, iron deficiency anemia.  The patient states that this issue happens to her every 6 months.  She is received multiple blood transfusions in the past.  She states that today she started to feel like her heart was racing with any exertion.  She reports associated fatigue, lightheadedness and dizziness, near syncope, and shortness of breath with exertion.  When she went to lie down she heard a "weird noise" in her chest.  She had her fianc listen who heard the noise as well.  She decided to come to the emergency department to be checked out.  She states that she has never been told why she is anemic.  She denies any bleeding issues.  There is no blood in the stool or vaginal bleeding.  She has not had her menstrual cycle in several months. She does not take blood thinners. She sometimes takes an Excedrin for migraines but only when it's severe. She has not seen GI or hematology for this issue.  She denies fever, chills, current shortness of breath, chest pain, abdominal pain, nausea, vomiting, diarrhea. She had an Echo this January which showed mild LV dysfunction.     HPI  Past Medical History:  Diagnosis Date  . Amenia 11/2018  . Anemia   . Blood transfusion without reported diagnosis   . Migraine   . PTSD (post-traumatic stress disorder)    reports daughter was murdered   . Tachycardia   . Thyroid disease   . Vitamin D deficiency     Patient Active Problem List   Diagnosis Date Noted  . Orthostatic hypotension 11/29/2018  . Chronic daily headache 06/08/2014  . Gastroesophageal reflux disease without esophagitis 06/08/2014  . Anemia, iron deficiency  03/06/2014  . Left shoulder pain 03/06/2014  . Anemia 12/27/2013  . Symptomatic anemia 12/27/2013  . Tachycardia 12/27/2013  . Bilateral leg edema 12/27/2013  . Goiter 12/27/2013  . Dizziness 12/27/2013  . Generalized weakness 12/27/2013  . Iron deficiency anemia 12/27/2013  . Low iron stores 12/27/2013  . UTI (urinary tract infection) 12/27/2013  . PTSD (post-traumatic stress disorder)   . Migraine     Past Surgical History:  Procedure Laterality Date  . TUBAL LIGATION       OB History   No obstetric history on file.      Home Medications    Prior to Admission medications   Medication Sig Start Date End Date Taking? Authorizing Provider  CALCIUM PO Take 500 mg by mouth daily.    [provider]  docusate sodium (COLACE) 100 MG capsule Take 1 capsule (100 mg total) by mouth 2 (two) times daily as needed for mild constipation. 01/29/17   Jani Gravel, MD  escitalopram (LEXAPRO) 20 MG tablet Take 1 tablet (20 mg total) by mouth daily. 06/18/19   Kerin Perna, NP  ferrous sulfate 325 (65 FE) MG tablet Take 1 tablet (325 mg total) by mouth 3 (three) times daily with meals. 06/18/19   Kerin Perna, NP  folic acid (FOLVITE) 1 MG tablet Take 1 tablet (1 mg total) by mouth daily. 06/19/19   Kerin Perna, NP  meclizine (ANTIVERT) 25 MG tablet  Take 1 tablet (25 mg total) by mouth 2 (two) times daily as needed for dizziness. 06/18/19   Kerin Perna, NP  SUMAtriptan (IMITREX) 50 MG tablet Take 1 tablet (50 mg total) by mouth every 2 (two) hours as needed for migraine. May repeat in 2 hours if headache persists or recurs. 06/18/19   Kerin Perna, NP  topiramate (TOPAMAX) 50 MG tablet Take 1 tablet (50 mg total) by mouth 2 (two) times daily. 11/30/18   Hosie Poisson, MD  traMADol (ULTRAM) 50 MG tablet Take 1 tablet (50 mg total) by mouth every 8 (eight) hours as needed. Patient taking differently: Take 50 mg by mouth every 8 (eight) hours as needed (for  pain).  05/29/18   Argentina Donovan, PA-C    Family History Family History  Problem Relation Age of Onset  . Hypertension Mother   . Arthritis Mother   . Hypertension Son   . Cancer Maternal Aunt   . Diabetes Maternal Grandmother     Social History Social History   Tobacco Use  . Smoking status: Never Smoker  . Smokeless tobacco: Never Used  Substance Use Topics  . Alcohol use: No  . Drug use: No     Allergies   Bee venom   Review of Systems Review of Systems  Constitutional: Positive for fatigue. Negative for fever.  Respiratory: Negative for shortness of breath.   Cardiovascular: Positive for leg swelling. Negative for chest pain.       +heart noise?  Gastrointestinal: Negative for abdominal pain, blood in stool, nausea and vomiting.  Genitourinary: Negative for hematuria and vaginal bleeding.  Neurological: Positive for light-headedness. Negative for syncope and headaches.  All other systems reviewed and are negative.    Physical Exam Updated Vital Signs BP 134/69 (BP Location: Right Arm)   Pulse 98   Temp 99.2 F (37.3 C) (Oral)   Resp 16   Ht 5\' 7"  (1.702 m)   Wt 113.4 kg   LMP 05/04/2016   SpO2 98%   BMI 39.16 kg/m   Physical Exam Vitals signs and nursing note reviewed.  Constitutional:      General: She is not in acute distress.    Appearance: She is well-developed. She is obese. She is not ill-appearing.     Comments: Calm, cooperative, pleasant  HENT:     Head: Normocephalic and atraumatic.  Eyes:     General: No scleral icterus.       Right eye: No discharge.        Left eye: No discharge.     Conjunctiva/sclera: Conjunctivae normal.     Pupils: Pupils are equal, round, and reactive to light.  Neck:     Musculoskeletal: Normal range of motion.  Cardiovascular:     Rate and Rhythm: Regular rhythm. Tachycardia present.  Pulmonary:     Effort: Pulmonary effort is normal. No respiratory distress.     Breath sounds: Normal breath sounds.   Abdominal:     General: There is no distension.     Palpations: Abdomen is soft.     Tenderness: There is no abdominal tenderness.  Skin:    General: Skin is warm and dry.  Neurological:     Mental Status: She is alert and oriented to person, place, and time.  Psychiatric:        Behavior: Behavior normal.      ED Treatments / Results  Labs (all labs ordered are listed, but only abnormal results are displayed) Labs  Reviewed  BASIC METABOLIC PANEL - Abnormal; Notable for the following components:      Result Value   Glucose, Bld 104 (*)    Calcium 8.7 (*)    All other components within normal limits  CBC - Abnormal; Notable for the following components:   RBC 3.04 (*)    Hemoglobin 5.3 (*)    HCT 20.0 (*)    MCV 65.8 (*)    MCH 17.4 (*)    MCHC 26.5 (*)    RDW 21.5 (*)    Platelets 582 (*)    nRBC 1.1 (*)    All other components within normal limits  SARS CORONAVIRUS 2  I-STAT BETA HCG BLOOD, ED ([facility], [facility], [facility] ONLY)  TYPE AND SCREEN    EKG EKG Interpretation  Date/Time:  Saturday July 19 2019 17:03:19 EDT Ventricular Rate:  113 PR Interval:  130 QRS Duration: 72 QT Interval:  322 QTC Calculation: 441 R Axis:   23 Text Interpretation:  Sinus tachycardia Cannot rule out Anterior infarct , age undetermined Abnormal ECG When compared to prior, faster rate.  No STEMI Confirmed by Antony Blackbird [phone]) on 07/19/2019 5:39:14 PM   Radiology No results found.  Procedures Procedures (including critical care time)  CRITICAL CARE Performed by: Recardo Evangelist   Total critical care time: 35 minutes  Critical care time was exclusive of separately billable procedures and treating other patients.  Critical care was necessary to treat or prevent imminent or life-threatening deterioration.  Critical care was time spent personally by me on the following activities: development of treatment plan with patient and/or surrogate as well as nursing, discussions with  consultants, evaluation of patient's response to treatment, examination of patient, obtaining history from patient or surrogate, ordering and performing treatments and interventions, ordering and review of laboratory studies, ordering and review of radiographic studies, pulse oximetry and re-evaluation of patient's condition.   Medications Ordered in ED Medications  sodium chloride flush (NS) 0.9 % injection 3 mL (has no administration in time range)  0.9 %  sodium chloride infusion (has no administration in time range)     Initial Impression / Assessment and Plan / ED Course  I have reviewed the triage vital signs and the nursing notes.  Pertinent labs & imaging results that were available during my care of the patient were reviewed by me and considered in my medical decision making (see chart for details).  48 year old female with worsening anemia. This is a recurrent issue with no clear etiology but it sounds like she hasn't had a full work up and has not seen hematology. She is mildly tachycardic here. Otherwise vitals are normal. Exam is unremarkable. Rectal was deferred since she denies blood in the stool and has had multiple negative hemoccults. CBC is remarkable for hgb of 5.3. BMP is remarkable for mild hypocalcemia. 2 units of PRBCs ordered. Discussed with Dr. Marthenia Rolling who will admit  Final Clinical Impressions(s) / ED Diagnoses   Final diagnoses:  Symptomatic anemia  Palpitations    ED Discharge Orders    None       Recardo Evangelist, PA-C 07/19/19 1906    Tegeler, Gwenyth Allegra, MD 07/19/19 1945

## 2019-07-19 NOTE — ED Notes (Signed)
Patient transported to X-ray 

## 2019-07-19 NOTE — ED Notes (Signed)
IV team at bedside 

## 2019-07-19 NOTE — H&P (Signed)
History and Physical  Christina Mcbride [id number] DOB: 1971/01/17 DOA: 07/19/2019  Referring physician: ER provider PCP: Alfonse Spruce, FNP  Outpatient Specialists:    Patient coming from: Home  Chief Complaint: Symptomatic anemia  HPI:  Patient is a 48 year old female past medical history significant for anemia, iron deficiency with prior history of blood transfusion.  Patient also has low normal folate (folate was 7 when it was checked in January of this year).  Patient presents with palpitation, dyspnea on minimal exertion and was found to have hemoglobin of 5.3 g/dL.  No obvious source of bleeding.  Patient tells me that her last menstrual period was in 2014.  No headache, abdominal pain, no URI symptoms, no GI symptoms and no urinary symptoms.  Patient be admitted for further assessment and management of symptomatic anemia.  ED Course: On presentation, patient was afebrile, heart rate of 90 bpm, respiratory rate of 16 and blood pressure 134/69 mmHg with O2 sat of 98.  Pertinent labs reveal hemoglobin of 5.3 g/dL, hematocrit of 20 with platelet count of 582.  ER team has already ordered 2 units of packed red blood cells.  Pertinent labs: CBC reveals WBC of 10.5, hemoglobin of 5.3, hematocrit of 20, MCV of 65.8 with platelet count of 582.  Last hemoglobin was 8.9 on December 02, 2018.  Mistry revealed sodium of 137, potassium of 4, chloride 105, CO2 23, BUN of 9 and creatinine of 0.86 with a blood sugar of 104.  Chest x-ray revealed retrocardiac hiatal hernia.  EKG: Independently reviewed.   Imaging: independently reviewed.   Review of Systems:  Negative for fever, visual changes, sore throat, rash, new muscle aches, dysuria, bleeding, n/v/abdominal pain.  Past Medical History:  Diagnosis Date  . Amenia 11/2018  . Anemia   . Blood transfusion without reported diagnosis   . Migraine   . PTSD (post-traumatic stress disorder)    reports daughter was murdered   . Tachycardia   .  Thyroid disease   . Vitamin D deficiency     Past Surgical History:  Procedure Laterality Date  . TUBAL LIGATION       reports that she has never smoked. She has never used smokeless tobacco. She reports that she does not drink alcohol or use drugs.  Allergies  Allergen Reactions  . Bee Venom Anaphylaxis    Family History  Problem Relation Age of Onset  . Hypertension Mother   . Arthritis Mother   . Hypertension Son   . Cancer Maternal Aunt   . Diabetes Maternal Grandmother      Prior to Admission medications   Medication Sig Start Date End Date Taking? Authorizing Provider  escitalopram (LEXAPRO) 20 MG tablet Take 1 tablet (20 mg total) by mouth daily. Patient taking differently: Take 20 mg by mouth at bedtime.  06/18/19  Yes Kerin Perna, NP  ferrous sulfate 325 (65 FE) MG tablet Take 1 tablet (325 mg total) by mouth 3 (three) times daily with meals. 06/18/19  Yes Kerin Perna, NP  folic acid (FOLVITE) 1 MG tablet Take 1 tablet (1 mg total) by mouth daily. 06/19/19  Yes Kerin Perna, NP  meclizine (ANTIVERT) 25 MG tablet Take 1 tablet (25 mg total) by mouth 2 (two) times daily as needed for dizziness. 06/18/19  Yes Kerin Perna, NP  traMADol (ULTRAM) 50 MG tablet Take 1 tablet (50 mg total) by mouth every 8 (eight) hours as needed. Patient taking differently: Take 50 mg by mouth every  8 (eight) hours as needed (for pain).  05/29/18  Yes Argentina Donovan, PA-C    Physical Exam: Vitals:   07/19/19 1708  BP: 134/69  Pulse: 98  Resp: 16  Temp: 99.2 F (37.3 C)  TempSrc: Oral  SpO2: 98%  Weight: 113.4 kg  Height: 5\' 7"  (1.702 m)    Constitutional:  . Appears calm and comfortable.  Patient is morbidly obese. Eyes:  . Severe pallor. No jaundice.  ENMT:  . external ears, nose appear normal Neck:  . Neck is supple. No JVD Respiratory:  . CTA bilaterally, no w/r/r.  . Respiratory effort normal. No retractions or accessory muscle use  Cardiovascular:  . S1S2 . No LE extremity edema   Abdomen:  . Abdomen is morbidly obese, soft and non tender. Organs are difficult to assess. Neurologic:  . Awake and alert. . Moves all limbs.  Wt Readings from Last 3 Encounters:  07/19/19 113.4 kg  03/19/19 113.9 kg  12/02/18 103.4 kg    I have personally reviewed following labs and imaging studies  Labs on Admission:  CBC: Recent Labs  Lab 07/19/19 1717  WBC 10.5  HGB 5.3*  HCT 20.0*  MCV 65.8*  PLT 0000000*   Basic Metabolic Panel: Recent Labs  Lab 07/19/19 1717  NA 137  K 4.0  CL 105  CO2 23  GLUCOSE 104*  BUN 9  CREATININE 0.86  CALCIUM 8.7*   Liver Function Tests: No results for input(s): AST, ALT, ALKPHOS, BILITOT, PROT, ALBUMIN in the last 168 hours. No results for input(s): LIPASE, AMYLASE in the last 168 hours. No results for input(s): AMMONIA in the last 168 hours. Coagulation Profile: No results for input(s): INR, PROTIME in the last 168 hours. Cardiac Enzymes: No results for input(s): CKTOTAL, CKMB, CKMBINDEX, TROPONINI in the last 168 hours. BNP (last 3 results) No results for input(s): PROBNP in the last 8760 hours. HbA1C: No results for input(s): HGBA1C in the last 72 hours. CBG: No results for input(s): GLUCAP in the last 168 hours. Lipid Profile: No results for input(s): CHOL, HDL, LDLCALC, TRIG, CHOLHDL, LDLDIRECT in the last 72 hours. Thyroid Function Tests: No results for input(s): TSH, T4TOTAL, FREET4, T3FREE, THYROIDAB in the last 72 hours. Anemia Panel: No results for input(s): VITAMINB12, FOLATE, FERRITIN, TIBC, IRON, RETICCTPCT in the last 72 hours. Urine analysis:    Component Value Date/Time   COLORURINE YELLOW 11/29/2018 0215   APPEARANCEUR HAZY (A) 11/29/2018 0215   LABSPEC 1.025 11/29/2018 0215   PHURINE 5.0 11/29/2018 0215   GLUCOSEU NEGATIVE 11/29/2018 0215   HGBUR SMALL (A) 11/29/2018 0215   BILIRUBINUR NEGATIVE 11/29/2018 0215   KETONESUR NEGATIVE 11/29/2018 0215    PROTEINUR NEGATIVE 11/29/2018 0215   UROBILINOGEN 0.2 07/10/2015 1115   NITRITE NEGATIVE 11/29/2018 0215   LEUKOCYTESUR NEGATIVE 11/29/2018 0215   Sepsis Labs: @LABRCNTIP (procalcitonin:4,lacticidven:4) )No results found for this or any previous visit (from the past 240 hour(s)).    Radiological Exams on Admission: Dg Chest 2 View  Result Date: 07/19/2019 CLINICAL DATA:  Chest pain. EXAM: CHEST - 2 VIEW COMPARISON:  Radiograph 11/28/2018 abdominal CT 417 FINDINGS: The cardiomediastinal contours are normal. Retrocardiac hiatal hernia. Pulmonary vasculature is normal. No consolidation, pleural effusion, or pneumothorax. No acute osseous abnormalities are seen. IMPRESSION: Retrocardiac hiatal hernia. Otherwise negative radiograph of the chest. Electronically Signed   By: Keith Rake M.D.   On: 07/19/2019 19:30    EKG: Independently reviewed.   Active Problems:   * No active hospital problems. *  Assessment/Plan Symptomatic anemia likely secondary to iron deficiency: Admit patient as planned. Transfuse 2 units of packed red blood cells Iron studies Folate level No obvious source of bleeding We need follow-up with GI and hematology team on discharge Further management depend on hospital course  Iron deficiency: Continue iron supplements. Follow with hematology on discharge.  Morbid obesity: Diet and exercise Further management on outpatient basis PCP to consider ruling out OSA.  DVT prophylaxis: SCD Code Status: Full code Family Communication: Husband Disposition Plan: Home eventually Consults called: None Admission status: Observation  Time spent: 60 minutes  Dana Allan, MD  [facility] Pager #: [phone] 7PM-7AM contact night coverage as above  07/19/2019, 7:47 PM

## 2019-07-19 NOTE — ED Triage Notes (Signed)
Pt presents to ED with concerns for a fast heart rate for "a while" but she noticed a weird noise starting today

## 2019-07-20 DIAGNOSIS — D649 Anemia, unspecified: Secondary | ICD-10-CM | POA: Diagnosis not present

## 2019-07-20 DIAGNOSIS — R002 Palpitations: Secondary | ICD-10-CM | POA: Diagnosis not present

## 2019-07-20 LAB — IRON AND TIBC
Iron: 388 ug/dL — ABNORMAL HIGH (ref 28–170)
Saturation Ratios: 116 % — ABNORMAL HIGH (ref 10.4–31.8)
TIBC: 333 ug/dL (ref 250–450)

## 2019-07-20 LAB — TYPE AND SCREEN
ABO/RH(D): B POS
Antibody Screen: NEGATIVE
Unit division: 0
Unit division: 0

## 2019-07-20 LAB — TSH: TSH: 1.683 u[IU]/mL (ref 0.350–4.500)

## 2019-07-20 LAB — FERRITIN: Ferritin: 19 ng/mL (ref 11–307)

## 2019-07-20 LAB — BPAM RBC
Blood Product Expiration Date: 202009012359
Blood Product Expiration Date: 202009032359
ISSUE DATE / TIME: 202008222047
ISSUE DATE / TIME: 202008222350
Unit Type and Rh: 7300
Unit Type and Rh: 7300

## 2019-07-20 LAB — HEMATOCRIT: HCT: 23.9 % — ABNORMAL LOW (ref 36.0–46.0)

## 2019-07-20 LAB — HEMOGLOBIN AND HEMATOCRIT, BLOOD
HCT: 25.4 % — ABNORMAL LOW (ref 36.0–46.0)
Hemoglobin: 7.5 g/dL — ABNORMAL LOW (ref 12.0–15.0)

## 2019-07-20 LAB — HEMOGLOBIN: Hemoglobin: 7 g/dL — ABNORMAL LOW (ref 12.0–15.0)

## 2019-07-20 LAB — SARS CORONAVIRUS 2 (TAT 6-24 HRS): SARS Coronavirus 2: NEGATIVE

## 2019-07-20 MED ORDER — ONDANSETRON HCL 4 MG/2ML IJ SOLN: 4.0000 mg | Freq: Four times a day (QID) | INTRAMUSCULAR | Status: DC | PRN

## 2019-07-20 NOTE — Care Management Obs Status (Signed)
[facility] NOTIFICATION   Patient Details  Name: Christina Mcbride MRN: [id number] Date of Birth: 12-Jan-1971   Medicare Observation Status Notification Given:  Yes    Carles Collet, RN 07/20/2019, 1:18 PM

## 2019-07-20 NOTE — Progress Notes (Signed)
Discharge instruction and medication information was given to patient. Patient verbal understands instructions. Pt is transferred to Main entrance via wheelchair.

## 2019-07-20 NOTE — Discharge Summary (Signed)
Physician Discharge Summary  Dnasia Gritzmacher [id number] DOB: [birth year] DOA: 07/19/2019  PCP: Kerin Perna, NP  Admit date: 07/19/2019 Discharge date: 07/20/2019  Admitted From: Home Disposition: Home  Recommendations for Outpatient Follow-up:  1. Follow up with PCP in 1-2 weeks\ 2. Follow-up with oncology, please call the number provided 3. Please obtain BMP/CBC in one week 4. Please follow up on the following pending results:  Home Health: None Equipment/Devices: None   Discharge Condition: Stable CODE STATUS: FULL Diet recommendation: Heart Healthy  Brief/Interim Summary: 48 year old female past medical history significant for anemia, iron deficiency with prior history of blood transfusion.  Patient also has low normal folate (folate was 7 when it was checked in January of this year).  Patient presents with palpitation, dyspnea on minimal exertion and was found to have hemoglobin of 5.3 g/dL.  No obvious source of bleeding.  Patient tells me that her last menstrual period was in 2014.  No headache, abdominal pain, no URI symptoms, no GI symptoms and no urinary symptoms.  Patient be admitted for further assessment and management of symptomatic anemia.  ED Course: On presentation, patient was afebrile, heart rate of 90 bpm, respiratory rate of 16 and blood pressure 134/69 mmHg with O2 sat of 98.  Pertinent labs reveal hemoglobin of 5.3 g/dL, hematocrit of 20 with platelet count of 582.  ER team has already ordered 2 units of packed red blood cells.  Pertinent labs: CBC reveals WBC of 10.5, hemoglobin of 5.3, hematocrit of 20, MCV of 65.8 with platelet count of 582.  Last hemoglobin was 8.9 on December 02, 2018.  Mistry revealed sodium of 137, potassium of 4, chloride 105, CO2 23, BUN of 9 and creatinine of 0.86 with a blood sugar of 104.  Chest x-ray revealed retrocardiac hiatal hernia  Patient was admitted, see complete heart units transfusion hemoglobin has improved to 7.5 g.   She has been ambulating.  She remains asymptomatic without obvious blood loss.  She reports she has been getting blood transfusion every 77-month and going on for over 5 years. She has iron supplements at home which she will continue it.  I have provided her with hematology referral and number provided so that she can call for appointment. At this time she is medically stable for discharge.   Discharge Diagnoses:  Active Problems:   Symptomatic anemia   Symptomatic anemia likely secondary to iron deficiency:Hemoglobin stabilized after 2 unit PRBC transfusion, at 7.5 g. She will follow-up with PCP with repeat CBC in a week.  She will also follow with hematology oncology and the number provided.  Iron studies are fairly stable.  Iron deficiency:Continue iron supplements. Follow with hematology on discharge.  Morbid obesity:Diet and exercise Further management on outpatient basis PCP to consider ruling out OSA.  Discharge Instructions  Discharge Instructions    Diet - low sodium heart healthy   Complete by: As directed    Discharge instructions   Complete by: As directed    Please call call MD or return to ER for similar or worsening recurring problem that brought you to hospital or if any fever,nausea/vomiting,abdominal pain, uncontrolled pain, chest pain,  shortness of breath or any other alarming symptoms.  Please follow-up your doctor and with hematology as instructed in a week time and call the office for appointment.  Check cbc in a week from pcp  Please avoid alcohol, smoking, or any other illicit substance and maintain healthy habits including taking your regular medications as prescribed.  You  were cared for by a hospitalist during your hospital stay. If you have any questions about your discharge medications or the care you received while you were in the hospital after you are discharged, you can call the unit and ask to speak with the hospitalist on call if the hospitalist  that took care of you is not available.  Once you are discharged, your primary care physician will handle any further medical issues. Please note that NO REFILLS for any discharge medications will be authorized once you are discharged, as it is imperative that you return to your primary care physician (or establish a relationship with a primary care physician if you do not have one) for your aftercare needs so that they can reassess your need for medications and monitor your lab values   Increase activity slowly   Complete by: As directed      Allergies as of 07/20/2019      Reactions   Bee Venom Anaphylaxis      Medication List    TAKE these medications   escitalopram 20 MG tablet Commonly known as: LEXAPRO Take 1 tablet (20 mg total) by mouth daily. What changed: when to take this   ferrous sulfate 325 (65 FE) MG tablet Take 1 tablet (325 mg total) by mouth 3 (three) times daily with meals.   folic acid 1 MG tablet Commonly known as: FOLVITE Take 1 tablet (1 mg total) by mouth daily.   meclizine 25 MG tablet Commonly known as: ANTIVERT Take 1 tablet (25 mg total) by mouth 2 (two) times daily as needed for dizziness.   traMADol 50 MG tablet Commonly known as: ULTRAM Take 1 tablet (50 mg total) by mouth every 8 (eight) hours as needed. What changed: reasons to take this      Follow-up Information    Kerin Perna, NP Follow up in 1 week(s).   Specialty: Internal Medicine Contact information: [place] [address] [phone]        Volanda Napoleon, MD. Call in 1 week(s).   Specialty: Oncology Contact information: [address] [place] [address] [phone]          Allergies  Allergen Reactions  . Bee Venom Anaphylaxis    Consultations:    Procedures/Studies: Dg Chest 2 View  Result Date: 07/19/2019 CLINICAL DATA:  Chest pain. EXAM: CHEST - 2 VIEW COMPARISON:  Radiograph 11/28/2018 abdominal CT 417  FINDINGS: The cardiomediastinal contours are normal. Retrocardiac hiatal hernia. Pulmonary vasculature is normal. No consolidation, pleural effusion, or pneumothorax. No acute osseous abnormalities are seen. IMPRESSION: Retrocardiac hiatal hernia. Otherwise negative radiograph of the chest. Electronically Signed   By: Keith Rake M.D.   On: 07/19/2019 19:30   Subjective: Doing well no nausea vomiting diarrhea or abdominal pain.  No hematemesis melena no hemoptysis.  Discharge Exam: Vitals:   07/20/19 0418 07/20/19 0835  BP: 130/65 125/82  Pulse: 91 91  Resp: 18 18  Temp: 98.8 F (37.1 C) 98.6 F (37 C)  SpO2: 100% 98%   Vitals:   07/20/19 0014 07/20/19 0243 07/20/19 0418 07/20/19 0835  BP: 129/72 124/76 130/65 125/82  Pulse: 88 96 91 91  Resp: 18 18 18 18   Temp: 98.6 F (37 C) 98.9 F (37.2 C) 98.8 F (37.1 C) 98.6 F (37 C)  TempSrc: Oral Oral Oral Oral  SpO2: 100% 99% 100% 98%  Weight:   115.3 kg   Height:        General:  Pt is alert, awake, not in acute distress Cardiovascular: RRR, S1/S2 +, no rubs, no gallops Respiratory: CTA bilaterally, no wheezing, no rhonchi Abdominal: Soft, NT, ND, bowel sounds + Extremities: no edema, no cyanosis   The results of significant diagnostics from this hospitalization (including imaging, microbiology, ancillary and laboratory) are listed below for reference.     Microbiology: Recent Results (from the past 240 hour(s))  SARS CORONAVIRUS 2 Nasal Swab Aptima Multi Swab     Status: None   Collection Time: 07/19/19  6:50 PM   Specimen: Aptima Multi Swab; Nasal Swab  Result Value Ref Range Status   SARS Coronavirus 2 NEGATIVE NEGATIVE Final    Comment: (NOTE) SARS-CoV-2 target nucleic acids are NOT DETECTED. The SARS-CoV-2 RNA is generally detectable in upper and lower respiratory specimens during the acute phase of infection. Negative results do not preclude SARS-CoV-2 infection, do not rule out co-infections with other  pathogens, and should not be used as the sole basis for treatment or other patient management decisions. Negative results must be combined with clinical observations, patient history, and epidemiological information. The expected result is Negative. Fact Sheet for Patients: SugarRoll.be Fact Sheet for Healthcare Providers: https://www.woods-mathews.com/ This test is not yet approved or cleared by the [place] FDA and  has been authorized for detection and/or diagnosis of SARS-CoV-2 by FDA under an Emergency Use Authorization (EUA). This EUA will remain  in effect (meaning this test can be used) for the duration of the COVID-19 declaration under Section 56 4(b)(1) of the Act, 21 U.S.C. section 360bbb-3(b)(1), unless the authorization is terminated or revoked sooner. Performed at [facility], [place] [address] [place], [place] [address]      Labs: BNP (last 3 results) Recent Labs    11/28/18 2135  BNP 0000000   Basic Metabolic Panel: Recent Labs  Lab 07/19/19 1717  NA 137  K 4.0  CL 105  CO2 23  GLUCOSE 104*  BUN 9  CREATININE 0.86  CALCIUM 8.7*   Liver Function Tests: No results for input(s): AST, ALT, ALKPHOS, BILITOT, PROT, ALBUMIN in the last 168 hours. No results for input(s): LIPASE, AMYLASE in the last 168 hours. No results for input(s): AMMONIA in the last 168 hours. CBC: Recent Labs  Lab 07/19/19 1717 07/20/19 0501 07/20/19 1018  WBC 10.5  --   --   HGB 5.3* 7.0* 7.5*  HCT 20.0* 23.9* 25.4*  MCV 65.8*  --   --   PLT 582*  --   --    Cardiac Enzymes: No results for input(s): CKTOTAL, CKMB, CKMBINDEX, TROPONINI in the last 168 hours. BNP: Invalid input(s): POCBNP CBG: No results for input(s): GLUCAP in the last 168 hours. D-Dimer No results for input(s): DDIMER in the last 72 hours. Hgb A1c No results for input(s): HGBA1C in the last 72 hours. Lipid Profile No results for input(s): CHOL, HDL,  LDLCALC, TRIG, CHOLHDL, LDLDIRECT in the last 72 hours. Thyroid function studies Recent Labs    07/20/19 0501  TSH 1.683   Anemia work up Recent Labs    07/20/19 0501  FERRITIN 19  TIBC 333  IRON 388*   Urinalysis    Component Value Date/Time   COLORURINE YELLOW 11/29/2018 0215   APPEARANCEUR HAZY (A) 11/29/2018 0215   LABSPEC 1.025 11/29/2018 0215   PHURINE 5.0 11/29/2018 0215   GLUCOSEU NEGATIVE 11/29/2018 0215   HGBUR SMALL (A) 11/29/2018 0215   BILIRUBINUR NEGATIVE 11/29/2018 0215   KETONESUR NEGATIVE 11/29/2018 0215  PROTEINUR NEGATIVE 11/29/2018 0215   UROBILINOGEN 0.2 07/10/2015 1115   NITRITE NEGATIVE 11/29/2018 0215   LEUKOCYTESUR NEGATIVE 11/29/2018 0215   Sepsis Labs Invalid input(s): PROCALCITONIN,  WBC,  LACTICIDVEN Microbiology Recent Results (from the past 240 hour(s))  SARS CORONAVIRUS 2 Nasal Swab Aptima Multi Swab     Status: None   Collection Time: 07/19/19  6:50 PM   Specimen: Aptima Multi Swab; Nasal Swab  Result Value Ref Range Status   SARS Coronavirus 2 NEGATIVE NEGATIVE Final    Comment: (NOTE) SARS-CoV-2 target nucleic acids are NOT DETECTED. The SARS-CoV-2 RNA is generally detectable in upper and lower respiratory specimens during the acute phase of infection. Negative results do not preclude SARS-CoV-2 infection, do not rule out co-infections with other pathogens, and should not be used as the sole basis for treatment or other patient management decisions. Negative results must be combined with clinical observations, patient history, and epidemiological information. The expected result is Negative. Fact Sheet for Patients: SugarRoll.be Fact Sheet for Healthcare Providers: https://www.woods-mathews.com/ This test is not yet approved or cleared by the [place] FDA and  has been authorized for detection and/or diagnosis of SARS-CoV-2 by FDA under an Emergency Use Authorization (EUA). This  EUA will remain  in effect (meaning this test can be used) for the duration of the COVID-19 declaration under Section 56 4(b)(1) of the Act, 21 U.S.C. section 360bbb-3(b)(1), unless the authorization is terminated or revoked sooner. Performed at [facility], [place] [address] [place], [place] [address]     Time coordinating discharge: 25 minutes  SIGNED:   Antonieta Pert, MD  [facility] 07/20/2019, 1:34 PM  If 7PM-7AM, please contact night-coverage [email or web address]

## 2019-07-20 NOTE — Progress Notes (Signed)
Patient got light headed during orthostatic vitals. Patient's blood pressure was checked and was 140/81. Patient also got nauseated, NP notified and received an order for Zofran but patient is reporting feeling better right now and does not want to take the zofran. Lab was called about coming to draw the H&H. Will continue to monitor.

## 2019-07-21 ENCOUNTER — Telehealth: Payer: Self-pay | Admitting: *Deleted

## 2019-07-21 LAB — FOLATE RBC
Folate, Hemolysate: 425 ng/mL
Folate, RBC: 1809 ng/mL (ref >498)
Hematocrit: 23.5 % — ABNORMAL LOW (ref 34.0–46.6)

## 2019-07-21 NOTE — Telephone Encounter (Signed)
Call placed back to patient and patient notified to contact her PCP for f/u and to see if referral is needed for Hematology.  Pt appreciative of call back and states that she will contact her PCP.

## 2019-07-21 NOTE — Telephone Encounter (Signed)
Call received from patient requesting an appt with Dr. Marin Olp d/t she was given this phone number from the physician who discharged her on 07/20/19.

## 2019-07-28 ENCOUNTER — Telehealth (INDEPENDENT_AMBULATORY_CARE_PROVIDER_SITE_OTHER): Payer: Medicare Other | Admitting: Primary Care

## 2019-07-29 ENCOUNTER — Telehealth (INDEPENDENT_AMBULATORY_CARE_PROVIDER_SITE_OTHER): Payer: Medicare Other | Admitting: Primary Care

## 2019-10-15 ENCOUNTER — Encounter (HOSPITAL_COMMUNITY): Payer: Self-pay | Admitting: *Deleted

## 2019-10-15 ENCOUNTER — Other Ambulatory Visit: Payer: Self-pay

## 2019-10-15 ENCOUNTER — Emergency Department (HOSPITAL_COMMUNITY): Payer: BC Managed Care – PPO

## 2019-10-15 ENCOUNTER — Inpatient Hospital Stay (HOSPITAL_COMMUNITY)
Admission: EM | Admit: 2019-10-15 | Discharge: 2019-10-17 | DRG: 812 | Disposition: A | Discharge status: Home / Self Care | Payer: BC Managed Care – PPO | Attending: Family Medicine | Admitting: Family Medicine

## 2019-10-15 DIAGNOSIS — Z7982 Long term (current) use of aspirin: Secondary | ICD-10-CM

## 2019-10-15 DIAGNOSIS — K449 Diaphragmatic hernia without obstruction or gangrene: Secondary | ICD-10-CM | POA: Diagnosis present

## 2019-10-15 DIAGNOSIS — F4310 Post-traumatic stress disorder, unspecified: Secondary | ICD-10-CM | POA: Diagnosis present

## 2019-10-15 DIAGNOSIS — K219 Gastro-esophageal reflux disease without esophagitis: Secondary | ICD-10-CM | POA: Diagnosis present

## 2019-10-15 DIAGNOSIS — D509 Iron deficiency anemia, unspecified: Secondary | ICD-10-CM | POA: Diagnosis not present

## 2019-10-15 DIAGNOSIS — R000 Tachycardia, unspecified: Secondary | ICD-10-CM | POA: Diagnosis not present

## 2019-10-15 DIAGNOSIS — I951 Orthostatic hypotension: Secondary | ICD-10-CM | POA: Diagnosis present

## 2019-10-15 DIAGNOSIS — Z87892 Personal history of anaphylaxis: Secondary | ICD-10-CM

## 2019-10-15 DIAGNOSIS — Z91030 Bee allergy status: Secondary | ICD-10-CM

## 2019-10-15 DIAGNOSIS — Z20828 Contact with and (suspected) exposure to other viral communicable diseases: Secondary | ICD-10-CM | POA: Diagnosis present

## 2019-10-15 DIAGNOSIS — Z79899 Other long term (current) drug therapy: Secondary | ICD-10-CM

## 2019-10-15 DIAGNOSIS — D649 Anemia, unspecified: Secondary | ICD-10-CM | POA: Diagnosis present

## 2019-10-15 DIAGNOSIS — D500 Iron deficiency anemia secondary to blood loss (chronic): Secondary | ICD-10-CM

## 2019-10-15 DIAGNOSIS — G43909 Migraine, unspecified, not intractable, without status migrainosus: Secondary | ICD-10-CM | POA: Diagnosis present

## 2019-10-15 MED ORDER — SODIUM CHLORIDE 0.9% FLUSH
3.0000 mL | Freq: Once | INTRAVENOUS | Status: AC
  Administered 2019-10-16: 04:00:00 3 mL via INTRAVENOUS

## 2019-10-15 MED ORDER — SODIUM CHLORIDE 0.9 % IV BOLUS (SEPSIS)
1000.0000 mL | Freq: Once | INTRAVENOUS | Status: AC
  Administered 2019-10-16: 1000 mL via INTRAVENOUS

## 2019-10-15 NOTE — ED Notes (Signed)
RN attempted to start IV, unsuccessful. 

## 2019-10-15 NOTE — ED Provider Notes (Signed)
[facility] EMERGENCY DEPARTMENT Provider Note   CSN: [id number] Arrival date & time: 10/15/19  2208     History   Chief Complaint Chief Complaint  Patient presents with  . Shortness of Breath    HPI Christina Mcbride is a 48 y.o. female.     The history is provided by the patient.  Shortness of Breath Severity:  Moderate Onset quality:  Gradual Duration:  2 weeks Timing:  Intermittent Progression:  Worsening Chronicity:  Recurrent Relieved by:  Rest Worsened by:  Exertion Associated symptoms: no cough, no fever, no syncope and no vomiting   Associated symptoms comment:  Chest tightness Patient with history of anemia, tachycardia presents with shortness of breath.  She reports the past week she has had episodes of dyspnea on exertion.  She also reports feeling dizzy but no syncope.  She reports some chest tightness.  She reports this feels worse since the weather has gotten cold.  Denies any recent blood loss.  She has had to receive blood transfusions previously.  She also reports a history of orthostatic hypotension.  Past Medical History:  Diagnosis Date  . Amenia 11/2018  . Anemia   . Blood transfusion without reported diagnosis   . Migraine   . PTSD (post-traumatic stress disorder)    reports daughter was murdered   . Tachycardia   . Thyroid disease   . Vitamin D deficiency     Patient Active Problem List   Diagnosis Date Noted  . Orthostatic hypotension 11/29/2018  . Chronic daily headache 06/08/2014  . Gastroesophageal reflux disease without esophagitis 06/08/2014  . Anemia, iron deficiency 03/06/2014  . Left shoulder pain 03/06/2014  . Anemia 12/27/2013  . Symptomatic anemia 12/27/2013  . Tachycardia 12/27/2013  . Bilateral leg edema 12/27/2013  . Goiter 12/27/2013  . Dizziness 12/27/2013  . Generalized weakness 12/27/2013  . Iron deficiency anemia 12/27/2013  . Low iron stores 12/27/2013  . UTI (urinary tract infection) 12/27/2013   . PTSD (post-traumatic stress disorder)   . Migraine     Past Surgical History:  Procedure Laterality Date  . TUBAL LIGATION       OB History   No obstetric history on file.      Home Medications    Prior to Admission medications   Medication Sig Start Date End Date Taking? Authorizing Provider  escitalopram (LEXAPRO) 20 MG tablet Take 1 tablet (20 mg total) by mouth daily. Patient taking differently: Take 20 mg by mouth at bedtime.  06/18/19   Kerin Perna, NP  ferrous sulfate 325 (65 FE) MG tablet Take 1 tablet (325 mg total) by mouth 3 (three) times daily with meals. 06/18/19   Kerin Perna, NP  folic acid (FOLVITE) 1 MG tablet Take 1 tablet (1 mg total) by mouth daily. 06/19/19   Kerin Perna, NP  meclizine (ANTIVERT) 25 MG tablet Take 1 tablet (25 mg total) by mouth 2 (two) times daily as needed for dizziness. 06/18/19   Kerin Perna, NP  traMADol (ULTRAM) 50 MG tablet Take 1 tablet (50 mg total) by mouth every 8 (eight) hours as needed. Patient taking differently: Take 50 mg by mouth every 8 (eight) hours as needed (for pain).  05/29/18   Argentina Donovan, PA-C    Family History Family History  Problem Relation Age of Onset  . Hypertension Mother   . Arthritis Mother   . Hypertension Son   . Cancer Maternal Aunt   . Diabetes Maternal  Grandmother     Social History Social History   Tobacco Use  . Smoking status: Never Smoker  . Smokeless tobacco: Never Used  Substance Use Topics  . Alcohol use: No  . Drug use: No     Allergies   Bee venom   Review of Systems Review of Systems  Constitutional: Negative for fever.  Respiratory: Positive for shortness of breath. Negative for cough.   Cardiovascular: Negative for syncope.  Gastrointestinal: Negative for blood in stool and vomiting.  Genitourinary: Negative for vaginal bleeding.  Neurological: Negative for syncope.  All other systems reviewed and are negative.    Physical Exam  Updated Vital Signs BP 125/85 (BP Location: Right Arm)   Pulse (!) 115   Temp 98.8 F (37.1 C) (Oral)   Resp 20   Ht 1.702 m (5\' 7" )   Wt 104.3 kg   LMP 05/04/2016   SpO2 100%   BMI 36.02 kg/m   Physical Exam CONSTITUTIONAL: Well developed/well nourished HEAD: Normocephalic/atraumatic EYES: EOMI/PERRL, conjunctival pink ENMT: Mucous membranes moist NECK: supple no meningeal signs SPINE/BACK:entire spine nontender CV: S1/S2 noted, no murmurs/rubs/gallops noted, tachycardic LUNGS: Lungs are clear to auscultation bilaterally, no apparent distress ABDOMEN: soft, nontender, no rebound or guarding, bowel sounds noted throughout abdomen GU:no cva tenderness NEURO: Pt is awake/alert/appropriate, moves all extremitiesx4.  No facial droop.   EXTREMITIES: pulses normal/equal, full ROM SKIN: warm, color normal PSYCH: no abnormalities of mood noted, alert and oriented to situation   ED Treatments / Results  Labs (all labs ordered are listed, but only abnormal results are displayed) Labs Reviewed  CBC - Abnormal; Notable for the following components:      Result Value   WBC 11.9 (*)    RBC 1.86 (*)    Hemoglobin 3.6 (*)    HCT 13.2 (*)    MCV 71.0 (*)    MCH 19.4 (*)    MCHC 27.3 (*)    RDW 19.3 (*)    Platelets 786 (*)    All other components within normal limits  COMPREHENSIVE METABOLIC PANEL  I-STAT BETA HCG BLOOD, ED ([facility], [facility], [facility] ONLY)  TYPE AND SCREEN  PREPARE RBC (CROSSMATCH)    EKG EKG Interpretation  Date/Time:  Wednesday October 15 2019 22:20:02 EST Ventricular Rate:  121 PR Interval:  146 QRS Duration: 72 QT Interval:  318 QTC Calculation: 451 R Axis:   18 Text Interpretation: Sinus tachycardia Anterior infarct , age undetermined Abnormal ECG No significant change since last tracing Confirmed by Isla Pence [id number]) on 10/15/2019 10:33:44 PM   Radiology Dg Chest Portable 1 View  Result Date: 10/15/2019 CLINICAL DATA:  Shortness of breath EXAM:  PORTABLE CHEST 1 VIEW COMPARISON:  07/19/2019 FINDINGS: Moderate-sized hiatal hernia. Heart and mediastinal contours are within normal limits. No focal opacities or effusions. No acute bony abnormality. IMPRESSION: Hiatal hernia.  No active cardiopulmonary disease. Electronically Signed   By: Rolm Baptise M.D.   On: 10/15/2019 22:35    Procedures .Critical Care Performed by: Ripley Fraise, MD Authorized by: Ripley Fraise, MD   Critical care provider statement:    Critical care time (minutes):  40   Critical care start time:  10/16/2019 3:37 AM   Critical care end time:  10/16/2019 4:17 AM   Critical care time was exclusive of:  Separately billable procedures and treating other patients   Critical care was necessary to treat or prevent imminent or life-threatening deterioration of the following conditions:  Shock and circulatory failure  Critical care was time spent personally by me on the following activities:  Ordering and review of laboratory studies, ordering and review of radiographic studies, pulse oximetry, re-evaluation of patient's condition, examination of patient, discussions with consultants, review of old charts and ordering and performing treatments and interventions   I assumed direction of critical care for this patient from another provider in my specialty: no      Medications Ordered in ED Medications  sodium chloride 0.9 % bolus 1,000 mL (1,000 mLs Intravenous New Bag/Given 10/16/19 0358)  0.9 %  sodium chloride infusion (has no administration in time range)  sodium chloride flush (NS) 0.9 % injection 3 mL (3 mLs Intravenous Given 10/16/19 0358)     Initial Impression / Assessment and Plan / ED Course  I have reviewed the triage vital signs and the nursing notes.  Pertinent labs & imaging results that were available during my care of the patient were reviewed by me and considered in my medical decision making (see chart for details).        11:35 PM  Patient has been admitted previously for iron deficiency anemia as well as orthostatic hypotension and tachycardia. Labs are pending at this time 2:53 AM There was a significant delay in obtaining labs.  Patient was a difficult IV stick.  Labs are sent, but patient is now refusing IV. 3:36 AM Patient found to have significant anemia.  Hemoglobin is now dropped to 3.6.  She has been admitted before for hemoglobin in the range of 5.  This is an acute on chronic process.  She no longer has any vaginal bleeding from menstrual cycle, denies any recent black or bloody stools. She would need to be admitted to the hospital 4:16 AM Updated patient on plan.  She received IV fluids and then blood transfusion.  Patient accepts blood transfusion D/w dr opyd for admission  Final Clinical Impressions(s) / ED Diagnoses   Final diagnoses:  Acute anemia  Tachycardia    ED Discharge Orders    None       Ripley Fraise, MD 10/16/19 [phone]

## 2019-10-15 NOTE — ED Triage Notes (Signed)
The pt is c/o sob and dizziness for 2 weeks  She ha had dizziness in the past and is on meds for the same no audible wheezes

## 2019-10-16 ENCOUNTER — Other Ambulatory Visit: Payer: Self-pay

## 2019-10-16 ENCOUNTER — Encounter (HOSPITAL_COMMUNITY): Payer: Self-pay | Admitting: Family Medicine

## 2019-10-16 DIAGNOSIS — Z87892 Personal history of anaphylaxis: Secondary | ICD-10-CM | POA: Diagnosis not present

## 2019-10-16 DIAGNOSIS — Z7982 Long term (current) use of aspirin: Secondary | ICD-10-CM | POA: Diagnosis not present

## 2019-10-16 DIAGNOSIS — Z20828 Contact with and (suspected) exposure to other viral communicable diseases: Secondary | ICD-10-CM | POA: Diagnosis present

## 2019-10-16 DIAGNOSIS — R000 Tachycardia, unspecified: Secondary | ICD-10-CM | POA: Diagnosis present

## 2019-10-16 DIAGNOSIS — F4310 Post-traumatic stress disorder, unspecified: Secondary | ICD-10-CM

## 2019-10-16 DIAGNOSIS — I951 Orthostatic hypotension: Secondary | ICD-10-CM | POA: Diagnosis present

## 2019-10-16 DIAGNOSIS — D649 Anemia, unspecified: Secondary | ICD-10-CM | POA: Diagnosis present

## 2019-10-16 DIAGNOSIS — D509 Iron deficiency anemia, unspecified: Principal | ICD-10-CM

## 2019-10-16 DIAGNOSIS — K219 Gastro-esophageal reflux disease without esophagitis: Secondary | ICD-10-CM | POA: Diagnosis present

## 2019-10-16 DIAGNOSIS — Z79899 Other long term (current) drug therapy: Secondary | ICD-10-CM | POA: Diagnosis not present

## 2019-10-16 DIAGNOSIS — Z91030 Bee allergy status: Secondary | ICD-10-CM | POA: Diagnosis not present

## 2019-10-16 DIAGNOSIS — K449 Diaphragmatic hernia without obstruction or gangrene: Secondary | ICD-10-CM | POA: Diagnosis present

## 2019-10-16 DIAGNOSIS — G43909 Migraine, unspecified, not intractable, without status migrainosus: Secondary | ICD-10-CM | POA: Diagnosis present

## 2019-10-16 LAB — POC OCCULT BLOOD, ED: Fecal Occult Bld: POSITIVE — AB

## 2019-10-16 LAB — CBC
HCT: 13.2 % — ABNORMAL LOW (ref 36.0–46.0)
HCT: 26.5 % — ABNORMAL LOW (ref 36.0–46.0)
Hemoglobin: 3.6 g/dL — CL (ref 12.0–15.0)
Hemoglobin: 7.3 g/dL — ABNORMAL LOW (ref 12.0–15.0)
MCH: 19.4 pg — ABNORMAL LOW (ref 26.0–34.0)
MCH: 20.7 pg — ABNORMAL LOW (ref 26.0–34.0)
MCHC: 27.3 g/dL — ABNORMAL LOW (ref 30.0–36.0)
MCHC: 27.5 g/dL — ABNORMAL LOW (ref 30.0–36.0)
MCV: 71 fL — ABNORMAL LOW (ref 80.0–100.0)
MCV: 75.3 fL — ABNORMAL LOW (ref 80.0–100.0)
Platelets: 786 10*3/uL — ABNORMAL HIGH (ref 150–400)
Platelets: 612 10*3/uL — ABNORMAL HIGH (ref 150–400)
RBC: 1.86 MIL/uL — ABNORMAL LOW (ref 3.87–5.11)
RBC: 3.52 MIL/uL — ABNORMAL LOW (ref 3.87–5.11)
RDW: 19.3 % — ABNORMAL HIGH (ref 11.5–15.5)
RDW: 21.6 % — ABNORMAL HIGH (ref 11.5–15.5)
WBC: 11.9 10*3/uL — ABNORMAL HIGH (ref 4.0–10.5)
WBC: 8.9 10*3/uL (ref 4.0–10.5)
nRBC: 0 % (ref 0.0–0.2)
nRBC: 0.2 % (ref 0.0–0.2)

## 2019-10-16 LAB — COMPREHENSIVE METABOLIC PANEL
ALT: 9 U/L (ref 0–44)
AST: 12 U/L — ABNORMAL LOW (ref 15–41)
Albumin: 3.1 g/dL — ABNORMAL LOW (ref 3.5–5.0)
Alkaline Phosphatase: 70 U/L (ref 38–126)
Anion gap: 8 (ref 5–15)
BUN: 14 mg/dL (ref 6–20)
CO2: 23 mmol/L (ref 22–32)
Calcium: 8.6 mg/dL — ABNORMAL LOW (ref 8.9–10.3)
Chloride: 108 mmol/L (ref 98–111)
Creatinine, Ser: 0.91 mg/dL (ref 0.44–1.00)
GFR calc Af Amer: >60 mL/min (ref >60)
GFR calc non Af Amer: >60 mL/min (ref >60)
Glucose, Bld: 102 mg/dL — ABNORMAL HIGH (ref 70–99)
Potassium: 4 mmol/L (ref 3.5–5.1)
Sodium: 139 mmol/L (ref 135–145)
Total Bilirubin: <0.1 mg/dL — ABNORMAL LOW (ref 0.3–1.2)
Total Protein: 7.3 g/dL (ref 6.5–8.1)

## 2019-10-16 LAB — DIFFERENTIAL
Basophils Absolute: 0 10*3/uL (ref 0.0–0.1)
Basophils Relative: 0 %
Eosinophils Absolute: 0.2 10*3/uL (ref 0.0–0.5)
Eosinophils Relative: 2 %
Lymphocytes Relative: 17 %
Lymphs Abs: 1.5 10*3/uL (ref 0.7–4.0)
Monocytes Absolute: 0.5 10*3/uL (ref 0.1–1.0)
Monocytes Relative: 6 %
Neutro Abs: 6.7 10*3/uL (ref 1.7–7.7)
Neutrophils Relative %: 75 %

## 2019-10-16 LAB — I-STAT BETA HCG BLOOD, ED (MC, WL, AP ONLY): I-stat hCG, quantitative: <5 m[IU]/mL (ref <5)

## 2019-10-16 LAB — SARS CORONAVIRUS 2 (TAT 6-24 HRS): SARS Coronavirus 2: NEGATIVE

## 2019-10-16 LAB — PREPARE RBC (CROSSMATCH)

## 2019-10-16 LAB — RETICULOCYTES
Immature Retic Fract: 25.7 % — ABNORMAL HIGH (ref 2.3–15.9)
RBC.: 3.52 MIL/uL — ABNORMAL LOW (ref 3.87–5.11)
Retic Count, Absolute: 22.9 10*3/uL (ref 19.0–186.0)
Retic Ct Pct: 0.7 % (ref 0.4–3.1)

## 2019-10-16 LAB — SAVE SMEAR(SSMR), FOR PROVIDER SLIDE REVIEW

## 2019-10-16 LAB — FERRITIN: Ferritin: 15 ng/mL (ref 11–307)

## 2019-10-16 LAB — VITAMIN B12: Vitamin B-12: 589 pg/mL (ref 180–914)

## 2019-10-16 LAB — IRON AND TIBC
Iron: 23 ug/dL — ABNORMAL LOW (ref 28–170)
Saturation Ratios: 7 % — ABNORMAL LOW (ref 10.4–31.8)
TIBC: 342 ug/dL (ref 250–450)
UIBC: 319 ug/dL

## 2019-10-16 LAB — FOLATE: Folate: 18.9 ng/mL (ref >5.9)

## 2019-10-16 LAB — LACTATE DEHYDROGENASE: LDH: 132 U/L (ref 98–192)

## 2019-10-16 MED ORDER — PEG-KCL-NACL-NASULF-NA ASC-C 100 G PO SOLR
0.5000 | Freq: Once | ORAL | Status: AC
  Administered 2019-10-16: 21:00:00 100 g via ORAL
  Filled 2019-10-16: qty 1

## 2019-10-16 MED ORDER — ONDANSETRON HCL 4 MG/2ML IJ SOLN
4.0000 mg | Freq: Four times a day (QID) | INTRAMUSCULAR | Status: DC | PRN
  Administered 2019-10-16: 4 mg via INTRAVENOUS
  Filled 2019-10-16: qty 2

## 2019-10-16 MED ORDER — SODIUM CHLORIDE 0.9% FLUSH: 3.0000 mL | INTRAVENOUS | Status: DC | PRN

## 2019-10-16 MED ORDER — SODIUM CHLORIDE 0.9% FLUSH
3.0000 mL | Freq: Two times a day (BID) | INTRAVENOUS | Status: DC
  Administered 2019-10-16: 3 mL via INTRAVENOUS

## 2019-10-16 MED ORDER — HYDROCODONE-ACETAMINOPHEN 5-325 MG PO TABS: 1.0000 | ORAL_TABLET | ORAL | Status: DC | PRN

## 2019-10-16 MED ORDER — ESCITALOPRAM OXALATE 20 MG PO TABS
20.0000 mg | ORAL_TABLET | Freq: Every day | ORAL | Status: DC
  Administered 2019-10-16: 20 mg via ORAL
  Filled 2019-10-16: qty 1

## 2019-10-16 MED ORDER — SODIUM CHLORIDE 0.9% FLUSH
3.0000 mL | Freq: Two times a day (BID) | INTRAVENOUS | Status: DC
  Administered 2019-10-16 (×2): 3 mL via INTRAVENOUS

## 2019-10-16 MED ORDER — METOCLOPRAMIDE HCL 5 MG/ML IJ SOLN
10.0000 mg | Freq: Once | INTRAMUSCULAR | Status: AC
  Administered 2019-10-16: 10 mg via INTRAVENOUS
  Filled 2019-10-16: qty 2

## 2019-10-16 MED ORDER — ONDANSETRON HCL 4 MG PO TABS: 4.0000 mg | ORAL_TABLET | Freq: Four times a day (QID) | ORAL | Status: DC | PRN

## 2019-10-16 MED ORDER — ACETAMINOPHEN 325 MG PO TABS
650.0000 mg | ORAL_TABLET | Freq: Four times a day (QID) | ORAL | Status: DC | PRN
  Administered 2019-10-16: 650 mg via ORAL
  Filled 2019-10-16: qty 2

## 2019-10-16 MED ORDER — SODIUM CHLORIDE 0.9 % IV SOLN: 250.0000 mL | INTRAVENOUS | Status: DC | PRN

## 2019-10-16 MED ORDER — BISACODYL 5 MG PO TBEC
20.0000 mg | DELAYED_RELEASE_TABLET | Freq: Once | ORAL | Status: AC
  Administered 2019-10-16: 20 mg via ORAL
  Filled 2019-10-16: qty 4

## 2019-10-16 MED ORDER — PEG-KCL-NACL-NASULF-NA ASC-C 100 G PO SOLR: 1.0000 | Freq: Once | ORAL | Status: DC

## 2019-10-16 MED ORDER — FUROSEMIDE 10 MG/ML IJ SOLN
20.0000 mg | Freq: Once | INTRAMUSCULAR | Status: AC
  Administered 2019-10-16: 20 mg via INTRAVENOUS
  Filled 2019-10-16: qty 2

## 2019-10-16 MED ORDER — SODIUM CHLORIDE 0.9% IV SOLUTION
Freq: Once | INTRAVENOUS | Status: AC
  Administered 2019-10-16: 14:00:00 via INTRAVENOUS

## 2019-10-16 MED ORDER — PEG-KCL-NACL-NASULF-NA ASC-C 100 G PO SOLR
0.5000 | Freq: Once | ORAL | Status: AC
  Administered 2019-10-16: 100 g via ORAL

## 2019-10-16 MED ORDER — PEG-KCL-NACL-NASULF-NA ASC-C 100 G PO SOLR
0.5000 | Freq: Once | ORAL | Status: DC
  Filled 2019-10-16: qty 1

## 2019-10-16 MED ORDER — ACETAMINOPHEN 650 MG RE SUPP: 650.0000 mg | Freq: Four times a day (QID) | RECTAL | Status: DC | PRN

## 2019-10-16 MED ORDER — FUROSEMIDE 10 MG/ML IJ SOLN: 20.0000 mg | Freq: Once | INTRAMUSCULAR | Status: DC

## 2019-10-16 MED ORDER — METOCLOPRAMIDE HCL 5 MG/ML IJ SOLN
10.0000 mg | Freq: Once | INTRAMUSCULAR | Status: AC
  Administered 2019-10-16: 10 mg via INTRAVENOUS

## 2019-10-16 MED ORDER — SODIUM CHLORIDE 0.9 % IV SOLN
10.0000 mL/h | Freq: Once | INTRAVENOUS | Status: AC
  Administered 2019-10-16: 10 mL/h via INTRAVENOUS

## 2019-10-16 NOTE — Consult Note (Addendum)
[facility] Consult: 1:53 PM 10/16/2019  LOS: 0 days    Referring Provider: Dr Sydnee Levans  Primary Care Physician:  Kerin Perna, NP at [facility]. Primary Gastroenterologist:  unassigned     Reason for Consultation: Anemia.  FOBT positive stools.   HPI: Christina Mcbride is a 48 y.o. female.  7 live vaginal births, kids ages 23 to 13.  S/p tubal ligation.  Orthostatic hypotension.  On disablility.    History of transfusion requiring anemia which she says dates back to 2014.  Initially it was attributed to menorrhagia but it has persisted even with cessation of menses over 1 year ago.  She has never seen a hematologist or to a GI doctor.  In January 2020 she was iron deficient.  She takes iron 325 TID.  She was admitted in 06/2019 and received transfusion with 2 units PRBCs for Hb 5.3, MCV 65.  Ferritin was 19, iron level 388, normal TIBC and above normal iron saturation.  She was given the phone number of oncologist to call and make an appointment but when she called, she they said she needed a referral from her PMD which has yet to happen so she never saw the hematologist.  She never sees unusual bleeding or bruising.  Her stools are brown.  A few of her cousins have sickle cell trait but no first-degree relatives with any anemia issues.  Patient rarely uses aspirin or nonsteroidal medications.  Patient has a history of orthostatic hypotension which causes chronic dizziness and weakness.  About a week ago her symptoms got much worse, getting to the point of presyncope and more significant dyspnea on exertion.  She is in the ED today and her Hb is 3.5, MCV 71.  She is currently transfusing with unit 3 of 4 PRBCs. BUN is not elevated, renal function normal. COVID-19 negative  No alcohol.   Occasional aspirin and NSAID products but nothing on a regular or heavy duty basis.    Past Medical History:  Diagnosis Date  . Amenia 11/2018  . Anemia   . Blood transfusion without reported diagnosis   . Migraine   . PTSD (post-traumatic stress disorder)    reports daughter was murdered   . Tachycardia   . Thyroid disease   . Vitamin D deficiency     Past Surgical History:  Procedure Laterality Date  . TUBAL LIGATION      Prior to Admission medications   Medication Sig Start Date End Date Taking? Authorizing Provider  aspirin-acetaminophen-caffeine (EXCEDRIN MIGRAINE) [phone] MG tablet Take 1-2 tablets by mouth every 6 (six) hours as needed for headache or migraine.   Yes [provider]  diphenhydrAMINE (BENADRYL) 25 mg capsule Take 25-50 mg by mouth as needed (for allergic reactions).   Yes [provider]  escitalopram (LEXAPRO) 20 MG tablet Take 1 tablet (20 mg total) by mouth daily. Patient taking differently: Take 20 mg by mouth at bedtime.  06/18/19  Yes Kerin Perna, NP  ferrous sulfate 325 (65 FE) MG tablet Take 1 tablet (  325 mg total) by mouth 3 (three) times daily with meals. 06/18/19  Yes Kerin Perna, NP  folic acid (FOLVITE) 1 MG tablet Take 1 tablet (1 mg total) by mouth daily. 06/19/19  Yes Kerin Perna, NP  meclizine (ANTIVERT) 25 MG tablet Take 1 tablet (25 mg total) by mouth 2 (two) times daily as needed for dizziness. 06/18/19  Yes Kerin Perna, NP  Multiple Vitamins-Minerals (CENTRUM MULTIGUMMIES) CHEW Chew 1-2 tablets by mouth daily.   Yes [provider]  SUMAtriptan (IMITREX) 50 MG tablet Take 50 mg by mouth every 2 (two) hours as needed for migraine or headache (and may repeat in 2 hours if headache persists or recurs).   Yes [provider]  traMADol (ULTRAM) 50 MG tablet Take 1 tablet (50 mg total) by mouth every 8 (eight) hours as needed. Patient not taking: Reported on 10/16/2019 05/29/18    Argentina Donovan, PA-C    Scheduled Meds: . escitalopram  20 mg Oral QHS  . furosemide  20 mg Intravenous Once  . sodium chloride flush  3 mL Intravenous Q12H  . sodium chloride flush  3 mL Intravenous Q12H   Infusions: . sodium chloride     PRN Meds: sodium chloride, acetaminophen **OR** acetaminophen, HYDROcodone-acetaminophen, ondansetron **OR** ondansetron (ZOFRAN) IV, sodium chloride flush   Allergies as of 10/15/2019 - Review Complete 10/15/2019  Allergen Reaction Noted  . Bee venom Anaphylaxis 06/06/2013    Family History  Problem Relation Age of Onset  . Hypertension Mother   . Arthritis Mother   . Hypertension Son   . Cancer Maternal Aunt   . Diabetes Maternal Grandmother     Social History   Socioeconomic History  . Marital status: Single    Spouse name: Not on file  . Number of children: Not on file  . Years of education: Not on file  . Highest education level: Not on file  Occupational History  . Not on file  Social Needs  . Financial resource strain: Not on file  . Food insecurity    Worry: Not on file    Inability: Not on file  . Transportation needs    Medical: Not on file    Non-medical: Not on file  Tobacco Use  . Smoking status: Never Smoker  . Smokeless tobacco: Never Used  Substance and Sexual Activity  . Alcohol use: No  . Drug use: No  . Sexual activity: Not Currently    Birth control/protection: Surgical  Lifestyle  . Physical activity    Days per week: Not on file    Minutes per session: Not on file  . Stress: Not on file  Relationships  . Social Herbalist on phone: Not on file    Gets together: Not on file    Attends religious service: Not on file    Active member of club or organization: Not on file    Attends meetings of clubs or organizations: Not on file    Relationship status: Not on file  . Intimate partner violence    Fear of current or ex partner: Not on file    Emotionally abused: Not on file     Physically abused: Not on file    Forced sexual activity: Not on file  Other Topics Concern  . Not on file  Social History Narrative   Pt relocated to [place] from [place] around 2012    REVIEW OF SYSTEMS: Constitutional: Weakness, dizziness ENT:  No  nose bleeds Pulm: Shortness of breath without cough. CV:  No palpitations, no LE edema.  GU:  No hematuria, no frequency GI: See HPI.  No dysphagia.  Appetite is great.  No abdominal pain.  Brown stools. Heme: See HPI Transfusions: See HPI. Neuro:  No headaches, no peripheral tingling or numbness.  Presyncope in the last several days. Derm:  No itching, no rash or sores.  Endocrine:  No sweats or chills.  No polyuria or dysuria Immunization: Reviewed.  I do not see a flu vaccination for this current season.    PHYSICAL EXAM: Vital signs in last 24 hours: Vitals:   10/16/19 1030 10/16/19 1335  BP: (!) 101/56 (!) 103/52  Pulse: 87 83  Resp: 11 16  Temp:  98.7 F (37.1 C)  SpO2: 100% 100%   Wt Readings from Last 3 Encounters:  10/15/19 104.3 kg  07/20/19 115.3 kg  03/19/19 113.9 kg    General: Obese, pleasant, comfortable, nonill appearing Head: No facial asymmetry or swelling.  No signs of head trauma. Eyes: No scleral icterus.  Conjunctiva pale.  EOMI. Ears: Not hard of hearing Nose: No congestion, no discharge. Mouth: Oropharynx moist, pink, clear.  Tongue midline. Neck: No JVD, no masses, no thyromegaly.  IV positioned at right neck.   Lungs: Clear bilaterally.  No labored breathing, no cough. Heart: RRR.  No MRG.  S1, S2 present Abdomen: Soft.  Obese.  Not tender, not distended.  No HSM, masses, bruits, hernias.   Rectal: I did not repeat DRE.  This was done by ED staff stool was brown and FOBT positive Musc/Skeltl: No joint redness, swelling or gross deformity. Extremities: No CCE Neurologic: Alert.  Oriented x3.  Good historian.  No tremors.  Moves all 4 limbs, strength not tested. Skin: No rash, no  sores, no suspicious lesions. Nodes: No cervical adenopathy. Psych: Calm, pleasant, cooperative, fluid speech.  Intake/Output from previous day: 11/18 0701 - 11/19 0700 In: 4268.7 [I.V.:2016.3; Blood:1252.3; IV Piggyback:1000] Out: -  Intake/Output this shift: Total I/O In: 630 [Blood:630] Out: -   LAB RESULTS: Recent Labs    10/16/19 0250 10/16/19 1047  WBC 11.9* 8.9  HGB 3.6* 7.3*  HCT 13.2* 26.5*  PLT 786* 612*   BMET Lab Results  Component Value Date   NA 139 10/16/2019   NA 137 07/19/2019   NA 136 12/02/2018   K 4.0 10/16/2019   K 4.0 07/19/2019   K 3.9 12/02/2018   CL 108 10/16/2019   CL 105 07/19/2019   CL 105 12/02/2018   CO2 23 10/16/2019   CO2 23 07/19/2019   CO2 24 12/02/2018   GLUCOSE 102 (H) 10/16/2019   GLUCOSE 104 (H) 07/19/2019   GLUCOSE 80 12/02/2018   BUN 14 10/16/2019   BUN 9 07/19/2019   BUN 9 12/02/2018   CREATININE 0.91 10/16/2019   CREATININE 0.86 07/19/2019   CREATININE 0.87 12/02/2018   CALCIUM 8.6 (L) 10/16/2019   CALCIUM 8.7 (L) 07/19/2019   CALCIUM 8.6 (L) 12/02/2018   LFT Recent Labs    10/16/19 0250  PROT 7.3  ALBUMIN 3.1*  AST 12*  ALT 9  ALKPHOS 70  BILITOT <0.1*   PT/INR Lab Results  Component Value Date   INR 1.10 01/28/2017   INR 1.08 12/27/2013   Hepatitis Panel No results for input(s): HEPBSAG, HCVAB, HEPAIGM, HEPBIGM in the last 72 hours. C-Diff No components found for: CDIFF Lipase     Component Value Date/Time   LIPASE 31 05/15/2018 2101  Drugs of Abuse     Component Value Date/Time   LABOPIA NONE DETECTED 12/27/2013 1439   COCAINSCRNUR NONE DETECTED 12/27/2013 1439   LABBENZ NONE DETECTED 12/27/2013 1439   AMPHETMU NONE DETECTED 12/27/2013 1439   THCU NONE DETECTED 12/27/2013 1439   LABBARB NONE DETECTED 12/27/2013 1439     RADIOLOGY STUDIES: Dg Chest Portable 1 View  Result Date: 10/15/2019 CLINICAL DATA:  Shortness of breath EXAM: PORTABLE CHEST 1 VIEW COMPARISON:  07/19/2019  FINDINGS: Moderate-sized hiatal hernia. Heart and mediastinal contours are within normal limits. No focal opacities or effusions. No acute bony abnormality. IMPRESSION: Hiatal hernia.  No active cardiopulmonary disease. Electronically Signed   By: Rolm Baptise M.D.   On: 10/15/2019 22:35      IMPRESSION:   *    Microcytic anemia.  On full dose oral iron but has recurrent, chronic microcytic anemia. No gross rectal bleeding but is FOBT positive.  *    Orthostatic hypotension.  *   IV access issues.  Current peripheral IV I in her neck at R ext jugular.  Marland Kitchen      PLAN:     *    Complete 4 PRBCs by early this evening.  *     Colonoscopy, EGD tomorrow?   *   Continue clear liquids today.  *   Pending labs include haptoglobin   Azucena Freed  10/16/2019, 1:53 PM Phone [phone]  I have reviewed the entire case in detail with the above APP and discussed the plan in detail.  Therefore, I agree with the diagnoses recorded above. In addition,  I have personally interviewed and examined the patient and have personally reviewed any abdominal/pelvic CT scan images.  My additional thoughts are as follows:   Not clear if this acute on chronic iron deficiency anemia is from chronic GI blood loss.  However, EGD and colonoscopy reasonable to rule that out.  Could be done as outpatient after her transfusion notes her hemoglobin in a safer range, but it seems like she has had difficulty navigating healthcare, could not get in with a hematologist in a timely fashion, and I would not want her to be lost to follow-up.  She has no chronic digestive symptoms, no known family history of colon or rectal cancer.  She believes a cousin may have had stomach cancer.  EGD and colonoscopy tomorrow.  She is agreeable after discussion of procedure and risks.  The benefits and risks of the planned procedure were described in detail with the patient or (when appropriate) their health care proxy.  Risks were  outlined as including, but not limited to, bleeding, infection, perforation, adverse medication reaction leading to cardiac or pulmonary decompensation, pancreatitis (if ERCP).  The limitation of incomplete mucosal visualization was also discussed.  No guarantees or warranties were given.  Hemoglobin should be in a safer range to do the procedures after 4 units of transfusion today.  She just got up to the medical floor after being in the ED most of the day, so the nursing staff is getting her bowel prep from pharmacy now.  She will be up most of the night doing that, and we may have to move her procedure to the afternoon to allow sufficient bowel preparation.  [name] [phone]

## 2019-10-16 NOTE — ED Notes (Signed)
IV team bedside. 

## 2019-10-16 NOTE — ED Notes (Signed)
  Attempted to start PIV on patient x2 with no success.  MD notified.

## 2019-10-16 NOTE — H&P (Signed)
History and Physical    Christina Mcbride [id number] DOB: 1971/08/23 DOA: 10/15/2019  PCP: Kerin Perna, NP   Patient coming from: Home   Chief Complaint: Lightheadedness, DOE   HPI: Christina Mcbride is a 48 y.o. female with medical history significant for iron deficiency anemia, posttraumatic stress disorder, and microcytic anemia, now presenting to the emergency department for evaluation of shortness of breath and lightheadedness.  Patient reports history of chronic orthostatic hypotension but this has been more severe in the past couple weeks, over which interval she has also developed new dyspnea with minimal exertion.  She denies any hematochezia, attributes her dark stools to her iron supplementation, and denies any abdominal pain, nausea, or vomiting.  She has not had a menstrual period in over a year.  She has never had endoscopy.  Has been admitted multiple times previously with symptomatic anemia requiring transfusions and with work-up consistent with iron deficiency anemia, during her last admission in August 2020, at which point her hemoglobin was 5.3, she was not iron deficient.  At time of discharge in August, she was referred to hematology by the discharging physician and she attempted to schedule this appointment, but was told that the referral had to come from her PCP; she has not yet gotten in to see her PCP.      ED Course: Upon arrival to the ED, patient is found to be afebrile, saturating well on room air, tachycardic to 120, and with blood pressure 102/60.  EKG features sinus tachycardia with rate 121.  Chest x-ray is negative for acute cardiopulmonary disease.  Chemistry panel is unremarkable.  CBC notable for leukocytosis to 11,900, thrombocytosis with platelets 786,000, and microcytic anemia with hemoglobin 3.6, down from 7.5 in August after she had been transfused.  Patient was given a liter of normal saline and 2 units of packed red blood cells were ordered from the ED.   COVID-19 screening test not yet resulted.  Review of Systems:  All other systems reviewed and apart from HPI, are negative.  Past Medical History:  Diagnosis Date  . Amenia 11/2018  . Anemia   . Blood transfusion without reported diagnosis   . Migraine   . PTSD (post-traumatic stress disorder)    reports daughter was murdered   . Tachycardia   . Thyroid disease   . Vitamin D deficiency     Past Surgical History:  Procedure Laterality Date  . TUBAL LIGATION       reports that she has never smoked. She has never used smokeless tobacco. She reports that she does not drink alcohol or use drugs.  Allergies  Allergen Reactions  . Bee Venom Anaphylaxis    Family History  Problem Relation Age of Onset  . Hypertension Mother   . Arthritis Mother   . Hypertension Son   . Cancer Maternal Aunt   . Diabetes Maternal Grandmother      Prior to Admission medications   Medication Sig Start Date End Date Taking? Authorizing Provider  aspirin-acetaminophen-caffeine (EXCEDRIN MIGRAINE) [phone] MG tablet Take 1-2 tablets by mouth every 6 (six) hours as needed for headache or migraine.   Yes [provider]  diphenhydrAMINE (BENADRYL) 25 mg capsule Take 25-50 mg by mouth as needed (for allergic reactions).   Yes [provider]  escitalopram (LEXAPRO) 20 MG tablet Take 1 tablet (20 mg total) by mouth daily. Patient taking differently: Take 20 mg by mouth at bedtime.  06/18/19  Yes Kerin Perna, NP  ferrous sulfate  325 (65 FE) MG tablet Take 1 tablet (325 mg total) by mouth 3 (three) times daily with meals. 06/18/19  Yes Kerin Perna, NP  folic acid (FOLVITE) 1 MG tablet Take 1 tablet (1 mg total) by mouth daily. 06/19/19  Yes Kerin Perna, NP  meclizine (ANTIVERT) 25 MG tablet Take 1 tablet (25 mg total) by mouth 2 (two) times daily as needed for dizziness. 06/18/19  Yes Kerin Perna, NP  Multiple Vitamins-Minerals (CENTRUM MULTIGUMMIES) CHEW  Chew 1-2 tablets by mouth daily.   Yes [provider]  SUMAtriptan (IMITREX) 50 MG tablet Take 50 mg by mouth every 2 (two) hours as needed for migraine or headache (and may repeat in 2 hours if headache persists or recurs).   Yes [provider]  traMADol (ULTRAM) 50 MG tablet Take 1 tablet (50 mg total) by mouth every 8 (eight) hours as needed. Patient not taking: Reported on 10/16/2019 05/29/18   Argentina Donovan, PA-C    Physical Exam: Vitals:   10/15/19 2330 10/16/19 0130 10/16/19 0145 10/16/19 0300  BP: (!) 121/55 112/69 107/78 102/61  Pulse: 96   95  Resp: 11 20 18 13   Temp:      TempSrc:      SpO2: 100%   100%  Weight:      Height:        Constitutional: NAD, calm  Eyes: PERTLA, lids and conjunctivae normal ENMT: Mucous membranes are moist. Posterior pharynx clear of any exudate or lesions.   Neck: normal, supple, no masses, no thyromegaly Respiratory:  no wheezing, no crackles. Normal respiratory effort. No accessory muscle use.  Cardiovascular: S1 & S2 heard, regular rate and rhythm. No significant JVD. Abdomen: No distension, no tenderness, soft. Bowel sounds active.  Musculoskeletal: no clubbing / cyanosis. No joint deformity upper and lower extremities.   Skin: no significant rashes, lesions, ulcers. Warm, dry, well-perfused. Neurologic: No facial asymmetry. Sensation intact. Moving all extremities.  Psychiatric: Alert and oriented to person, place, and situation. Pleasant, cooperative.      Labs on Admission: I have personally reviewed following labs and imaging studies  CBC: Recent Labs  Lab 10/16/19 0250  WBC 11.9*  HGB 3.6*  HCT 13.2*  MCV 71.0*  PLT [id number]*   Basic Metabolic Panel: Recent Labs  Lab 10/16/19 0250  NA 139  K 4.0  CL 108  CO2 23  GLUCOSE 102*  BUN 14  CREATININE 0.91  CALCIUM 8.6*   GFR: Estimated Creatinine Clearance: 92.9 mL/min (by C-G formula based on SCr of 0.91 mg/dL). Liver Function Tests: Recent Labs   Lab 10/16/19 0250  AST 12*  ALT 9  ALKPHOS 70  BILITOT <0.1*  PROT 7.3  ALBUMIN 3.1*   No results for input(s): LIPASE, AMYLASE in the last 168 hours. No results for input(s): AMMONIA in the last 168 hours. Coagulation Profile: No results for input(s): INR, PROTIME in the last 168 hours. Cardiac Enzymes: No results for input(s): CKTOTAL, CKMB, CKMBINDEX, TROPONINI in the last 168 hours. BNP (last 3 results) No results for input(s): PROBNP in the last 8760 hours. HbA1C: No results for input(s): HGBA1C in the last 72 hours. CBG: No results for input(s): GLUCAP in the last 168 hours. Lipid Profile: No results for input(s): CHOL, HDL, LDLCALC, TRIG, CHOLHDL, LDLDIRECT in the last 72 hours. Thyroid Function Tests: No results for input(s): TSH, T4TOTAL, FREET4, T3FREE, THYROIDAB in the last 72 hours. Anemia Panel: No results for input(s): VITAMINB12, FOLATE, FERRITIN, TIBC, IRON,  RETICCTPCT in the last 72 hours. Urine analysis:    Component Value Date/Time   COLORURINE YELLOW 11/29/2018 0215   APPEARANCEUR HAZY (A) 11/29/2018 0215   LABSPEC 1.025 11/29/2018 0215   PHURINE 5.0 11/29/2018 0215   GLUCOSEU NEGATIVE 11/29/2018 0215   HGBUR SMALL (A) 11/29/2018 0215   BILIRUBINUR NEGATIVE 11/29/2018 0215   KETONESUR NEGATIVE 11/29/2018 0215   PROTEINUR NEGATIVE 11/29/2018 0215   UROBILINOGEN 0.2 07/10/2015 1115   NITRITE NEGATIVE 11/29/2018 0215   LEUKOCYTESUR NEGATIVE 11/29/2018 0215   Sepsis Labs: @LABRCNTIP (procalcitonin:4,lacticidven:4) )No results found for this or any previous visit (from the past 240 hour(s)).   Radiological Exams on Admission: Dg Chest Portable 1 View  Result Date: 10/15/2019 CLINICAL DATA:  Shortness of breath EXAM: PORTABLE CHEST 1 VIEW COMPARISON:  07/19/2019 FINDINGS: Moderate-sized hiatal hernia. Heart and mediastinal contours are within normal limits. No focal opacities or effusions. No acute bony abnormality. IMPRESSION: Hiatal hernia.  No  active cardiopulmonary disease. Electronically Signed   By: Rolm Baptise M.D.   On: 10/15/2019 22:35    EKG: Independently reviewed. Sinus tachycardia (rate 121).   Assessment/Plan   1. Symptomatic anemia  - Presents with progressive DOE and lightheadedness on standing, found to have Hgb of 3.6, down from 7.5 in August (after she was transfused up from 5.3) - She has hx of IDA but has been taking iron supplement and was not iron-deficient in August 2020 when she required transfusions  - She has not noticed any bleeding, bruising, or change in bowel habits   - 2 units RBC ordered from ED and first unit now infusing  - Check FOBT, add anemia panel, LDH, haptoglobin, and save smear from initial blood draw, check H&H after transfusing 2 units RBC  2. PTSD  - Continue Lexapro     PPE: Mask, face shield  DVT prophylaxis: SCD's  Code Status: Full  Family Communication: Discussed with patient  Consults called: none  Admission status: Observation     Vianne Bulls, MD [facility] Pager [phone]  If 7PM-7AM, please contact night-coverage [email or web address] Password [email or web address]  10/16/2019, 4:08 AM

## 2019-10-16 NOTE — ED Notes (Signed)
Clear liquid dinner tray ordered 

## 2019-10-16 NOTE — ED Notes (Signed)
Report given to 6 N RN. All questions answered 

## 2019-10-16 NOTE — H&P (View-Only) (Signed)
[facility] Consult: 1:53 PM 10/16/2019  LOS: 0 days    Referring Provider: Dr Sydnee Levans  Primary Care Physician:  Kerin Perna, NP at [facility]. Primary Gastroenterologist:  unassigned     Reason for Consultation: Anemia.  FOBT positive stools.   HPI: Christina Mcbride is a 48 y.o. female.  7 live vaginal births, kids ages 9 to 54.  S/p tubal ligation.  Orthostatic hypotension.  On disablility.    History of transfusion requiring anemia which she says dates back to 2014.  Initially it was attributed to menorrhagia but it has persisted even with cessation of menses over 1 year ago.  She has never seen a hematologist or to a GI doctor.  In January 2020 she was iron deficient.  She takes iron 325 TID.  She was admitted in 06/2019 and received transfusion with 2 units PRBCs for Hb 5.3, MCV 65.  Ferritin was 19, iron level 388, normal TIBC and above normal iron saturation.  She was given the phone number of oncologist to call and make an appointment but when she called, she they said she needed a referral from her PMD which has yet to happen so she never saw the hematologist.  She never sees unusual bleeding or bruising.  Her stools are brown.  A few of her cousins have sickle cell trait but no first-degree relatives with any anemia issues.  Patient rarely uses aspirin or nonsteroidal medications.  Patient has a history of orthostatic hypotension which causes chronic dizziness and weakness.  About a week ago her symptoms got much worse, getting to the point of presyncope and more significant dyspnea on exertion.  She is in the ED today and her Hb is 3.5, MCV 71.  She is currently transfusing with unit 3 of 4 PRBCs. BUN is not elevated, renal function normal. COVID-19 negative  No alcohol.   Occasional aspirin and NSAID products but nothing on a regular or heavy duty basis.    Past Medical History:  Diagnosis Date  . Amenia 11/2018  . Anemia   . Blood transfusion without reported diagnosis   . Migraine   . PTSD (post-traumatic stress disorder)    reports daughter was murdered   . Tachycardia   . Thyroid disease   . Vitamin D deficiency     Past Surgical History:  Procedure Laterality Date  . TUBAL LIGATION      Prior to Admission medications   Medication Sig Start Date End Date Taking? Authorizing Provider  aspirin-acetaminophen-caffeine (EXCEDRIN MIGRAINE) [phone] MG tablet Take 1-2 tablets by mouth every 6 (six) hours as needed for headache or migraine.   Yes [provider]  diphenhydrAMINE (BENADRYL) 25 mg capsule Take 25-50 mg by mouth as needed (for allergic reactions).   Yes [provider]  escitalopram (LEXAPRO) 20 MG tablet Take 1 tablet (20 mg total) by mouth daily. Patient taking differently: Take 20 mg by mouth at bedtime.  06/18/19  Yes Kerin Perna, NP  ferrous sulfate 325 (65 FE) MG tablet Take 1 tablet (  325 mg total) by mouth 3 (three) times daily with meals. 06/18/19  Yes Kerin Perna, NP  folic acid (FOLVITE) 1 MG tablet Take 1 tablet (1 mg total) by mouth daily. 06/19/19  Yes Kerin Perna, NP  meclizine (ANTIVERT) 25 MG tablet Take 1 tablet (25 mg total) by mouth 2 (two) times daily as needed for dizziness. 06/18/19  Yes Kerin Perna, NP  Multiple Vitamins-Minerals (CENTRUM MULTIGUMMIES) CHEW Chew 1-2 tablets by mouth daily.   Yes [provider]  SUMAtriptan (IMITREX) 50 MG tablet Take 50 mg by mouth every 2 (two) hours as needed for migraine or headache (and may repeat in 2 hours if headache persists or recurs).   Yes [provider]  traMADol (ULTRAM) 50 MG tablet Take 1 tablet (50 mg total) by mouth every 8 (eight) hours as needed. Patient not taking: Reported on 10/16/2019 05/29/18    Argentina Donovan, PA-C    Scheduled Meds: . escitalopram  20 mg Oral QHS  . furosemide  20 mg Intravenous Once  . sodium chloride flush  3 mL Intravenous Q12H  . sodium chloride flush  3 mL Intravenous Q12H   Infusions: . sodium chloride     PRN Meds: sodium chloride, acetaminophen **OR** acetaminophen, HYDROcodone-acetaminophen, ondansetron **OR** ondansetron (ZOFRAN) IV, sodium chloride flush   Allergies as of 10/15/2019 - Review Complete 10/15/2019  Allergen Reaction Noted  . Bee venom Anaphylaxis 06/06/2013    Family History  Problem Relation Age of Onset  . Hypertension Mother   . Arthritis Mother   . Hypertension Son   . Cancer Maternal Aunt   . Diabetes Maternal Grandmother     Social History   Socioeconomic History  . Marital status: Single    Spouse name: Not on file  . Number of children: Not on file  . Years of education: Not on file  . Highest education level: Not on file  Occupational History  . Not on file  Social Needs  . Financial resource strain: Not on file  . Food insecurity    Worry: Not on file    Inability: Not on file  . Transportation needs    Medical: Not on file    Non-medical: Not on file  Tobacco Use  . Smoking status: Never Smoker  . Smokeless tobacco: Never Used  Substance and Sexual Activity  . Alcohol use: No  . Drug use: No  . Sexual activity: Not Currently    Birth control/protection: Surgical  Lifestyle  . Physical activity    Days per week: Not on file    Minutes per session: Not on file  . Stress: Not on file  Relationships  . Social Herbalist on phone: Not on file    Gets together: Not on file    Attends religious service: Not on file    Active member of club or organization: Not on file    Attends meetings of clubs or organizations: Not on file    Relationship status: Not on file  . Intimate partner violence    Fear of current or ex partner: Not on file    Emotionally abused: Not on file     Physically abused: Not on file    Forced sexual activity: Not on file  Other Topics Concern  . Not on file  Social History Narrative   Pt relocated to [place] from [place] around 2012    REVIEW OF SYSTEMS: Constitutional: Weakness, dizziness ENT:  No  nose bleeds Pulm: Shortness of breath without cough. CV:  No palpitations, no LE edema.  GU:  No hematuria, no frequency GI: See HPI.  No dysphagia.  Appetite is great.  No abdominal pain.  Brown stools. Heme: See HPI Transfusions: See HPI. Neuro:  No headaches, no peripheral tingling or numbness.  Presyncope in the last several days. Derm:  No itching, no rash or sores.  Endocrine:  No sweats or chills.  No polyuria or dysuria Immunization: Reviewed.  I do not see a flu vaccination for this current season.    PHYSICAL EXAM: Vital signs in last 24 hours: Vitals:   10/16/19 1030 10/16/19 1335  BP: (!) 101/56 (!) 103/52  Pulse: 87 83  Resp: 11 16  Temp:  98.7 F (37.1 C)  SpO2: 100% 100%   Wt Readings from Last 3 Encounters:  10/15/19 104.3 kg  07/20/19 115.3 kg  03/19/19 113.9 kg    General: Obese, pleasant, comfortable, nonill appearing Head: No facial asymmetry or swelling.  No signs of head trauma. Eyes: No scleral icterus.  Conjunctiva pale.  EOMI. Ears: Not hard of hearing Nose: No congestion, no discharge. Mouth: Oropharynx moist, pink, clear.  Tongue midline. Neck: No JVD, no masses, no thyromegaly.  IV positioned at right neck.   Lungs: Clear bilaterally.  No labored breathing, no cough. Heart: RRR.  No MRG.  S1, S2 present Abdomen: Soft.  Obese.  Not tender, not distended.  No HSM, masses, bruits, hernias.   Rectal: I did not repeat DRE.  This was done by ED staff stool was brown and FOBT positive Musc/Skeltl: No joint redness, swelling or gross deformity. Extremities: No CCE Neurologic: Alert.  Oriented x3.  Good historian.  No tremors.  Moves all 4 limbs, strength not tested. Skin: No rash, no  sores, no suspicious lesions. Nodes: No cervical adenopathy. Psych: Calm, pleasant, cooperative, fluid speech.  Intake/Output from previous day: 11/18 0701 - 11/19 0700 In: 4268.7 [I.V.:2016.3; Blood:1252.3; IV Piggyback:1000] Out: -  Intake/Output this shift: Total I/O In: 630 [Blood:630] Out: -   LAB RESULTS: Recent Labs    10/16/19 0250 10/16/19 1047  WBC 11.9* 8.9  HGB 3.6* 7.3*  HCT 13.2* 26.5*  PLT 786* 612*   BMET Lab Results  Component Value Date   NA 139 10/16/2019   NA 137 07/19/2019   NA 136 12/02/2018   K 4.0 10/16/2019   K 4.0 07/19/2019   K 3.9 12/02/2018   CL 108 10/16/2019   CL 105 07/19/2019   CL 105 12/02/2018   CO2 23 10/16/2019   CO2 23 07/19/2019   CO2 24 12/02/2018   GLUCOSE 102 (H) 10/16/2019   GLUCOSE 104 (H) 07/19/2019   GLUCOSE 80 12/02/2018   BUN 14 10/16/2019   BUN 9 07/19/2019   BUN 9 12/02/2018   CREATININE 0.91 10/16/2019   CREATININE 0.86 07/19/2019   CREATININE 0.87 12/02/2018   CALCIUM 8.6 (L) 10/16/2019   CALCIUM 8.7 (L) 07/19/2019   CALCIUM 8.6 (L) 12/02/2018   LFT Recent Labs    10/16/19 0250  PROT 7.3  ALBUMIN 3.1*  AST 12*  ALT 9  ALKPHOS 70  BILITOT <0.1*   PT/INR Lab Results  Component Value Date   INR 1.10 01/28/2017   INR 1.08 12/27/2013   Hepatitis Panel No results for input(s): HEPBSAG, HCVAB, HEPAIGM, HEPBIGM in the last 72 hours. C-Diff No components found for: CDIFF Lipase     Component Value Date/Time   LIPASE 31 05/15/2018 2101  Drugs of Abuse     Component Value Date/Time   LABOPIA NONE DETECTED 12/27/2013 1439   COCAINSCRNUR NONE DETECTED 12/27/2013 1439   LABBENZ NONE DETECTED 12/27/2013 1439   AMPHETMU NONE DETECTED 12/27/2013 1439   THCU NONE DETECTED 12/27/2013 1439   LABBARB NONE DETECTED 12/27/2013 1439     RADIOLOGY STUDIES: Dg Chest Portable 1 View  Result Date: 10/15/2019 CLINICAL DATA:  Shortness of breath EXAM: PORTABLE CHEST 1 VIEW COMPARISON:  07/19/2019  FINDINGS: Moderate-sized hiatal hernia. Heart and mediastinal contours are within normal limits. No focal opacities or effusions. No acute bony abnormality. IMPRESSION: Hiatal hernia.  No active cardiopulmonary disease. Electronically Signed   By: Rolm Baptise M.D.   On: 10/15/2019 22:35      IMPRESSION:   *    Microcytic anemia.  On full dose oral iron but has recurrent, chronic microcytic anemia. No gross rectal bleeding but is FOBT positive.  *    Orthostatic hypotension.  *   IV access issues.  Current peripheral IV I in her neck at R ext jugular.  Marland Kitchen      PLAN:     *    Complete 4 PRBCs by early this evening.  *     Colonoscopy, EGD tomorrow?   *   Continue clear liquids today.  *   Pending labs include haptoglobin   Azucena Freed  10/16/2019, 1:53 PM Phone [phone]  I have reviewed the entire case in detail with the above APP and discussed the plan in detail.  Therefore, I agree with the diagnoses recorded above. In addition,  I have personally interviewed and examined the patient and have personally reviewed any abdominal/pelvic CT scan images.  My additional thoughts are as follows:   Not clear if this acute on chronic iron deficiency anemia is from chronic GI blood loss.  However, EGD and colonoscopy reasonable to rule that out.  Could be done as outpatient after her transfusion notes her hemoglobin in a safer range, but it seems like she has had difficulty navigating healthcare, could not get in with a hematologist in a timely fashion, and I would not want her to be lost to follow-up.  She has no chronic digestive symptoms, no known family history of colon or rectal cancer.  She believes a cousin may have had stomach cancer.  EGD and colonoscopy tomorrow.  She is agreeable after discussion of procedure and risks.  The benefits and risks of the planned procedure were described in detail with the patient or (when appropriate) their health care proxy.  Risks were  outlined as including, but not limited to, bleeding, infection, perforation, adverse medication reaction leading to cardiac or pulmonary decompensation, pancreatitis (if ERCP).  The limitation of incomplete mucosal visualization was also discussed.  No guarantees or warranties were given.  Hemoglobin should be in a safer range to do the procedures after 4 units of transfusion today.  She just got up to the medical floor after being in the ED most of the day, so the nursing staff is getting her bowel prep from pharmacy now.  She will be up most of the night doing that, and we may have to move her procedure to the afternoon to allow sufficient bowel preparation.  [name] Office:[phone]

## 2019-10-16 NOTE — ED Notes (Signed)
ED Provider at bedside. 

## 2019-10-16 NOTE — ED Notes (Signed)
ED TO INPATIENT HANDOFF REPORT  ED Nurse Name and Phone #: Tearah Saulsbury, [phone]  S Name/Age/Gender Christina Mcbride 49 y.o. female Room/Bed: 036C/036C  Code Status   Code Status: Full Code  Home/SNF/Other Home Patient oriented to: self, place, time and situation Is this baseline? Yes   Triage Complete: Triage complete  Chief Complaint SOB dizziness  Triage Note The pt is c/o sob and dizziness for 2 weeks  She ha had dizziness in the past and is on meds for the same no audible wheezes   Allergies Allergies  Allergen Reactions  . Bee Venom Anaphylaxis    Level of Care/Admitting Diagnosis ED Disposition    ED Disposition Condition [facility] Area: [place] [facility] [[id number]]  Level of Care: Telemetry Medical [104]  I expect the patient will be discharged within 24 hours: Yes  LOW acuity---Tx typically complete <24 hrs---ACUTE conditions typically can be evaluated <24 hours---LABS likely to return to acceptable levels <24 hours---IS near functional baseline---EXPECTED to return to current living arrangement---NOT newly hypoxic: Does not meet criteria for 5C-Observation unit  Covid Evaluation: Asymptomatic Screening Protocol (No Symptoms)  Diagnosis: Symptomatic anemia [id number]  Admitting Physician: Vianne Bulls [id number]  Attending Physician: Vianne Bulls [id number]  PT Class (Do Not Modify): Observation [104]  PT Acc Code (Do Not Modify): Observation [10022]       B Medical/Surgery History Past Medical History:  Diagnosis Date  . Amenia 11/2018  . Anemia   . Blood transfusion without reported diagnosis   . Migraine   . PTSD (post-traumatic stress disorder)    reports daughter was murdered   . Tachycardia   . Thyroid disease   . Vitamin D deficiency    Past Surgical History:  Procedure Laterality Date  . TUBAL LIGATION       A IV Location/Drains/Wounds Patient Lines/Drains/Airways Status   Active Line/Drains/Airways    Name:    Placement date:   Placement time:   Site:   Days:   Peripheral IV 10/16/19 Right External jugular   10/16/19    0344    External jugular   less than 1          Intake/Output Last 24 hours  Intake/Output Summary (Last 24 hours) at 10/16/2019 1148 Last data filed at 10/16/2019 1025 Gross per 24 hour  Intake 4898.66 ml  Output -  Net 4898.66 ml    Labs/Imaging Results for orders placed or performed during the hospital encounter of 10/15/19 (from the past 48 hour(s))  Comprehensive metabolic panel     Status: Abnormal   Collection Time: 10/16/19  2:50 AM  Result Value Ref Range   Sodium 139 135 - 145 mmol/L   Potassium 4.0 3.5 - 5.1 mmol/L   Chloride 108 98 - 111 mmol/L   CO2 23 22 - 32 mmol/L   Glucose, Bld 102 (H) 70 - 99 mg/dL   BUN 14 6 - 20 mg/dL   Creatinine, Ser 0.91 0.44 - 1.00 mg/dL   Calcium 8.6 (L) 8.9 - 10.3 mg/dL   Total Protein 7.3 6.5 - 8.1 g/dL   Albumin 3.1 (L) 3.5 - 5.0 g/dL   AST 12 (L) 15 - 41 U/L   ALT 9 0 - 44 U/L   Alkaline Phosphatase 70 38 - 126 U/L   Total Bilirubin <0.1 (L) 0.3 - 1.2 mg/dL   GFR calc non Af Amer >60 >60 mL/min   GFR calc Af Amer >60 >60 mL/min   Anion  gap 8 5 - 15    Comment: Performed at [facility], [place] [address], [place], [place] [address]  CBC     Status: Abnormal   Collection Time: 10/16/19  2:50 AM  Result Value Ref Range   WBC 11.9 (H) 4.0 - 10.5 K/uL   RBC 1.86 (L) 3.87 - 5.11 MIL/uL   Hemoglobin 3.6 (LL) 12.0 - 15.0 g/dL    Comment: REPEATED TO VERIFY Reticulocyte Hemoglobin testing may be clinically indicated, consider ordering this additional test [id number] THIS CRITICAL RESULT HAS VERIFIED AND BEEN CALLED TO SQUIER R RN BY SAINVIL SAINVILUS ON 11 19 2020 AT 0315, AND HAS BEEN READ BACK.     HCT 13.2 (L) 36.0 - 46.0 %   MCV 71.0 (L) 80.0 - 100.0 fL   MCH 19.4 (L) 26.0 - 34.0 pg   MCHC 27.3 (L) 30.0 - 36.0 g/dL   RDW 19.3 (H) 11.5 - 15.5 %   Platelets 786 (H) 150 - 400 K/uL   nRBC 0.0 0.0 - 0.2 %     Comment: Performed at [facility] [address] [place], [place] [address]  Type and screen     Status: None (Preliminary result)   Collection Time: 10/16/19  2:50 AM  Result Value Ref Range   ABO/RH(D) B POS    Antibody Screen NEG    Sample Expiration      10/19/2019,2359 Performed at [facility], [place] [address] [place], [place] [address]    Unit Number [id number]    Blood Component Type RBC LR PHER2    Unit division 00    Status of Unit ISSUED    Transfusion Status OK TO TRANSFUSE    Crossmatch Result Compatible    Unit Number [id number]    Blood Component Type RED CELLS,LR    Unit division 00    Status of Unit ISSUED    Transfusion Status OK TO TRANSFUSE    Crossmatch Result Compatible    Unit Number [id number]    Blood Component Type RBC LR PHER1    Unit division 00    Status of Unit ALLOCATED    Transfusion Status OK TO TRANSFUSE    Crossmatch Result Compatible    Unit Number [id number]    Blood Component Type RED CELLS,LR    Unit division 00    Status of Unit ALLOCATED    Transfusion Status OK TO TRANSFUSE    Crossmatch Result Compatible   Prepare RBC     Status: None   Collection Time: 10/16/19  4:00 AM  Result Value Ref Range   Order Confirmation      ORDER PROCESSED BY BLOOD BANK Performed at [facility], [place] [address], [place], [place] [address]   I-Stat Beta hCG blood, ED ([facility], [facility], [facility] only)     Status: None   Collection Time: 10/16/19  4:18 AM  Result Value Ref Range   I-stat hCG, quantitative <5.0 <5 mIU/mL   Comment 3            Comment:   GEST. AGE      CONC.  (mIU/mL)   <=1 WEEK        5 - 50     2 WEEKS       50 - 500     3 WEEKS       100 - 10,000     4 WEEKS     1,000 - 30,000  FEMALE AND NON-PREGNANT FEMALE:     LESS THAN 5 mIU/mL   SARS CORONAVIRUS 2 (TAT 6-24 HRS) Nasopharyngeal Nasopharyngeal Swab     Status: None   Collection Time: 10/16/19  4:35 AM   Specimen: Nasopharyngeal Swab  Result Value  Ref Range   SARS Coronavirus 2 NEGATIVE NEGATIVE    Comment: (NOTE) SARS-CoV-2 target nucleic acids are NOT DETECTED. The SARS-CoV-2 RNA is generally detectable in upper and lower respiratory specimens during the acute phase of infection. Negative results do not preclude SARS-CoV-2 infection, do not rule out co-infections with other pathogens, and should not be used as the sole basis for treatment or other patient management decisions. Negative results must be combined with clinical observations, patient history, and epidemiological information. The expected result is Negative. Fact Sheet for Patients: SugarRoll.be Fact Sheet for Healthcare Providers: https://www.woods-mathews.com/ This test is not yet approved or cleared by the [place] FDA and  has been authorized for detection and/or diagnosis of SARS-CoV-2 by FDA under an Emergency Use Authorization (EUA). This EUA will remain  in effect (meaning this test can be used) for the duration of the COVID-19 declaration under Section 56 4(b)(1) of the Act, 21 U.S.C. section 360bbb-3(b)(1), unless the authorization is terminated or revoked sooner. Performed at [facility], [place] [address] [place], [place] [address]   Prepare RBC     Status: None   Collection Time: 10/16/19  7:38 AM  Result Value Ref Range   Order Confirmation      ORDER PROCESSED BY BLOOD BANK Performed at [facility], [place] [address] [place],  [address]   POC occult blood, ED Provider will collect     Status: Abnormal   Collection Time: 10/16/19 10:02 AM  Result Value Ref Range   Fecal Occult Bld POSITIVE (A) NEGATIVE   Dg Chest Portable 1 View  Result Date: 10/15/2019 CLINICAL DATA:  Shortness of breath EXAM: PORTABLE CHEST 1 VIEW COMPARISON:  07/19/2019 FINDINGS: Moderate-sized hiatal hernia. Heart and mediastinal contours are within normal limits. No focal opacities or effusions. No acute bony  abnormality. IMPRESSION: Hiatal hernia.  No active cardiopulmonary disease. Electronically Signed   By: Rolm Baptise M.D.   On: 10/15/2019 22:35    Pending Labs Unresulted Labs (From admission, onward)    Start     Ordered   10/16/19 1047  CBC  Once,   R     10/16/19 1047   10/16/19 0412  Save Smear  Add-on,   AD     10/16/19 0411   10/16/19 0412  Differential  Add-on,   AD     10/16/19 0411   10/16/19 0411  Lactate dehydrogenase  Add-on,   AD     10/16/19 0411   10/16/19 0411  Haptoglobin  Add-on,   AD     10/16/19 0411   10/16/19 0404  Vitamin B12  (Anemia Panel (PNL))  Add-on,   AD     10/16/19 0403   10/16/19 0404  Folate  (Anemia Panel (PNL))  Add-on,   AD     10/16/19 0403   10/16/19 0404  Iron and TIBC  (Anemia Panel (PNL))  Add-on,   AD     10/16/19 0403   10/16/19 0404  Ferritin  (Anemia Panel (PNL))  Add-on,   AD     10/16/19 0403   10/16/19 0404  Reticulocytes  (Anemia Panel (PNL))  Add-on,   AD     10/16/19 0403   Unscheduled  Occult blood card to lab, stool RN will collect  As needed,   R    Question:  Specimen to be collected by:  Answer:  RN will collect   10/16/19 0508          Vitals/Pain Today's Vitals   10/16/19 0945 10/16/19 1000 10/16/19 1015 10/16/19 1024  BP: 91/60 (!) 89/48 94/61 94/61   Pulse: 88 88 90 86  Resp: 13 11 11 11   Temp:    99.2 F (37.3 C)  TempSrc:    Oral  SpO2: 100% 100% 100%   Weight:      Height:      PainSc:        Isolation Precautions No active isolations  Medications Medications  escitalopram (LEXAPRO) tablet 20 mg (has no administration in time range)  sodium chloride flush (NS) 0.9 % injection 3 mL (has no administration in time range)  sodium chloride flush (NS) 0.9 % injection 3 mL (has no administration in time range)  sodium chloride flush (NS) 0.9 % injection 3 mL (has no administration in time range)  0.9 %  sodium chloride infusion (has no administration in time range)  acetaminophen (TYLENOL) tablet 650  mg (has no administration in time range)    Or  acetaminophen (TYLENOL) suppository 650 mg (has no administration in time range)  HYDROcodone-acetaminophen (NORCO/VICODIN) 5-325 MG per tablet 1-2 tablet (has no administration in time range)  ondansetron (ZOFRAN) tablet 4 mg (has no administration in time range)    Or  ondansetron (ZOFRAN) injection 4 mg (has no administration in time range)  0.9 %  sodium chloride infusion (Manually program via Guardrails IV Fluids) (has no administration in time range)  furosemide (LASIX) injection 20 mg (has no administration in time range)  furosemide (LASIX) injection 20 mg (has no administration in time range)  sodium chloride flush (NS) 0.9 % injection 3 mL (3 mLs Intravenous Given 10/16/19 0358)  sodium chloride 0.9 % bolus 1,000 mL (0 mLs Intravenous Stopped 10/16/19 0428)  0.9 %  sodium chloride infusion (0 mL/hr Intravenous Stopped 10/16/19 0606)    Mobility walks Low fall risk   Focused Assessments    R Recommendations: See Admitting Provider Note  Report given to:   Additional Notes:

## 2019-10-16 NOTE — Progress Notes (Addendum)
PROGRESS NOTE    Patient: Christina Mcbride                            PCP: Kerin Perna, NP                    DOB: [birth year]            DOA: 10/15/2019 [id number]             DOS: 10/16/2019, 7:21 AM   LOS: 0 days   Date of Service: The patient was seen and examined on 10/16/2019  Subjective:   The patient was seen and examined this morning, remained stable no acute distress,  hemodynamically remained to be stable.  Reporting of no rectal bleed, stating she does not have her periods anymore.  Brief Narrative. HPI :   Christina Mcbride is a 48 y.o. female with medical history significant for iron deficiency anemia, posttraumatic stress disorder, and microcytic anemia, now presenting to the emergency department for evaluation of shortness of breath and lightheadedness.   Patient reports history of chronic orthostatic hypotension but this has been more severe in the past couple weeks, over which interval she has also developed new dyspnea with minimal exertion.  She denies any hematochezia, attributes her dark stools to her iron supplementation, and denies any abdominal pain, nausea, or vomiting.  She has not had a menstrual period in over a year.  She has never had endoscopy.  Has been admitted multiple times previously with symptomatic anemia requiring transfusions and with work-up consistent with iron deficiency anemia, during her last admission in August 2020, at which point her hemoglobin was 5.3, she was not iron deficient.  At time of discharge in August, she was referred to hematology by the discharging physician and she attempted to schedule this appointment, but was told that the referral had to come from her PCP; she has not yet gotten in to see her PCP.      ED Course: patient is found to be afebrile, saturating well on room air, tachycardic to 120, and with blood pressure 102/60.  EKG features sinus tachycardia with rate 121.  Chest x-ray is negative for acute  cardiopulmonary disease.  Chemistry panel is unremarkable.  CBC notable for leukocytosis to 11,900, thrombocytosis with platelets 786,000, and microcytic anemia with hemoglobin 3.6, down from 7.5 in August after she had been transfused.  Patient was given a liter of normal saline and 2 units of packed red blood cells were ordered from the ED. -COVID-19 screening   Assessment & Plan:   Principal Problem:   Symptomatic anemia  Symptomatic anemia  -Symptomatic with tachycardia heart rate as high as 115, mild hypotension blood pressure as low as 105/54, - progressive DOE and lightheadedness on standing, found to have Hgb of 3.6, down from 7.5 in August (after she was transfused up from 5.3) -Iron deficiency anemia - has been taking iron supplement and was not iron-deficient in August 2020 when she required transfusions  - She has not noticed any bleeding, bruising, or change in bowel habits   - 2 units RBC -posttransfusion hemoglobin improved to 7.3, remained mildly hypotensive, tachycardic, still complain generalized weaknesses -we have decided to pursue another 2 units of packed red blood cell transfusion -we will proceed with slow transfusion, with Lasix in between - Check FOBT >> Hemoccult positive -GI consulted:  pending evaluation -  Anemia panel; -Total iron 23 low, TIBC 432,  sat ratio of 7 low, ferritin 15 normal, folate normal at 18.9 B12 normal 589,    -LDH, haptoglobin, and save smear from initial blood draw,    PTSD  - Continue Lexapro     PPE: Mask, face shield  DVT prophylaxis: SCD's  Code Status: Full  Family Communication: Discussed with patient  Consults called: none  Admission status: Observation       Procedures:   No admission procedures for hospital encounter.     Antimicrobials:  Anti-infectives (From admission, onward)   None       Medication:  . escitalopram  20 mg Oral QHS  . sodium chloride flush  3 mL Intravenous Q12H  . sodium chloride  flush  3 mL Intravenous Q12H    sodium chloride, acetaminophen **OR** acetaminophen, HYDROcodone-acetaminophen, ondansetron **OR** ondansetron (ZOFRAN) IV, sodium chloride flush   Objective:   Vitals:   10/16/19 0600 10/16/19 0615 10/16/19 0632 10/16/19 0647  BP: (!) 111/57 110/62 (!) 115/52 (!) 114/54  Pulse: 87 88 94 88  Resp: 11 12 18 12   Temp:   98.8 F (37.1 C) 98.9 F (37.2 C)  TempSrc:   Oral Oral  SpO2: 100% 100% 100% 100%  Weight:      Height:        Intake/Output Summary (Last 24 hours) at 10/16/2019 [id number] Last data filed at 10/16/2019 [id number] Gross per 24 hour  Intake 4268.66 ml  Output -  Net 4268.66 ml   Filed Weights   10/15/19 2216  Weight: 104.3 kg     Examination:   Physical Exam  female chaperone nursing present at bedside Constitution:  Alert, cooperative, no distress,  Appears calm and comfortable  Psychiatric: Normal and stable mood and affect, cognition intact,   HEENT: Normocephalic, PERRL, otherwise with in Normal limits  Chest:Chest symmetric Cardio vascular:  S1/S2, RRR, No murmure, No Rubs or Gallops  pulmonary: Clear to auscultation bilaterally, respirations unlabored, negative wheezes / crackles Abdomen: Soft, non-tender, non-distended, bowel sounds,no masses, no organomegaly Muscular skeletal: Limited exam - in bed, able to move all 4 extremities, Normal strength,  Neuro: CNII-XII intact. , normal motor and sensation, reflexes intact  Extremities: No pitting edema lower extremities, +2 pulses  Rectal exam no zip visible hemorrhoids, or acute frank blood, fecal Hemoccult was obtained Skin: Dry, warm to touch, negative for any Rashes, No open wounds Wounds: per nursing documentation  LABs:  CBC Latest Ref Rng & Units 10/16/2019 07/20/2019 07/20/2019  WBC 4.0 - 10.5 K/uL 11.9(H) - -  Hemoglobin 12.0 - 15.0 g/dL 3.6(LL) 7.5(L) -  Hematocrit 36.0 - 46.0 % 13.2(L) 25.4(L) 23.5(L)  Platelets 150 - 400 K/uL 786(H) - -   CMP Latest Ref Rng &  Units 10/16/2019 07/19/2019 12/02/2018  Glucose 70 - 99 mg/dL 102(H) 104(H) 80  BUN 6 - 20 mg/dL 14 9 9   Creatinine 0.44 - 1.00 mg/dL 0.91 0.86 0.87  Sodium 135 - 145 mmol/L 139 137 136  Potassium 3.5 - 5.1 mmol/L 4.0 4.0 3.9  Chloride 98 - 111 mmol/L 108 105 105  CO2 22 - 32 mmol/L 23 23 24   Calcium 8.9 - 10.3 mg/dL 8.6(L) 8.7(L) 8.6(L)  Total Protein 6.5 - 8.1 g/dL 7.3 - -  Total Bilirubin 0.3 - 1.2 mg/dL <0.1(L) - -  Alkaline Phos 38 - 126 U/L 70 - -  AST 15 - 41 U/L 12(L) - -  ALT 0 - 44 U/L 9 - -      SIGNED: Deatra James,  MD, FACP, FHM. [facility],  Pager [phone]  If 7PM-7AM, please contact night-coverage [email or web address], Password [facility] 10/16/2019, 7:21 AM

## 2019-10-17 ENCOUNTER — Inpatient Hospital Stay (HOSPITAL_COMMUNITY): Payer: BC Managed Care – PPO | Admitting: Certified Registered"

## 2019-10-17 ENCOUNTER — Encounter (HOSPITAL_COMMUNITY)
Admission: EM | Disposition: A | Discharge status: Home / Self Care | Payer: Self-pay | Source: Home / Self Care | Attending: Family Medicine

## 2019-10-17 DIAGNOSIS — K449 Diaphragmatic hernia without obstruction or gangrene: Secondary | ICD-10-CM

## 2019-10-17 HISTORY — PX: BIOPSY: SHX5522

## 2019-10-17 HISTORY — PX: ESOPHAGOGASTRODUODENOSCOPY (EGD) WITH PROPOFOL: SHX5813

## 2019-10-17 HISTORY — PX: COLONOSCOPY WITH PROPOFOL: SHX5780

## 2019-10-17 LAB — TYPE AND SCREEN
ABO/RH(D): B POS
Antibody Screen: NEGATIVE
Unit division: 0
Unit division: 0
Unit division: 0
Unit division: 0

## 2019-10-17 LAB — BPAM RBC
Blood Product Expiration Date: 202012162359
Blood Product Expiration Date: 202012162359
Blood Product Expiration Date: 202012162359
Blood Product Expiration Date: 202012162359
ISSUE DATE / TIME: 202011190411
ISSUE DATE / TIME: 202011190616
ISSUE DATE / TIME: 202011191329
ISSUE DATE / TIME: 202011191747
Unit Type and Rh: 7300
Unit Type and Rh: 7300
Unit Type and Rh: 7300
Unit Type and Rh: 7300

## 2019-10-17 LAB — HEMOGLOBIN AND HEMATOCRIT, BLOOD
HCT: 33.4 % — ABNORMAL LOW (ref 36.0–46.0)
Hemoglobin: 10.5 g/dL — ABNORMAL LOW (ref 12.0–15.0)

## 2019-10-17 SURGERY — COLONOSCOPY WITH PROPOFOL
Anesthesia: Monitor Anesthesia Care

## 2019-10-17 MED ORDER — HYDROCODONE-ACETAMINOPHEN 5-325 MG PO TABS: 1.0000 | ORAL_TABLET | Freq: Four times a day (QID) | ORAL | 0 refills | Status: AC | PRN

## 2019-10-17 MED ORDER — MIDAZOLAM HCL 5 MG/5ML IJ SOLN
INTRAMUSCULAR | Status: DC | PRN
  Administered 2019-10-17: 2 mg via INTRAVENOUS

## 2019-10-17 MED ORDER — GLYCOPYRROLATE PF 0.2 MG/ML IJ SOSY
PREFILLED_SYRINGE | INTRAMUSCULAR | Status: DC | PRN
  Administered 2019-10-17: .1 mg via INTRAVENOUS

## 2019-10-17 MED ORDER — FAMOTIDINE 20 MG PO TABS: 40.0000 mg | ORAL_TABLET | Freq: Two times a day (BID) | ORAL | 1 refills | Status: DC

## 2019-10-17 MED ORDER — ACETAMINOPHEN 325 MG PO TABS: 650.0000 mg | ORAL_TABLET | Freq: Four times a day (QID) | ORAL | 0 refills | Status: AC | PRN

## 2019-10-17 MED ORDER — FENTANYL CITRATE (PF) 100 MCG/2ML IJ SOLN
INTRAMUSCULAR | Status: DC | PRN
  Administered 2019-10-17 (×2): 50 ug via INTRAVENOUS

## 2019-10-17 MED ORDER — LACTATED RINGERS IV SOLN
INTRAVENOUS | Status: DC | PRN
  Administered 2019-10-17: 13:00:00 via INTRAVENOUS

## 2019-10-17 MED ORDER — LIDOCAINE 2% (20 MG/ML) 5 ML SYRINGE
INTRAMUSCULAR | Status: DC | PRN
  Administered 2019-10-17 (×2): 50 mg via INTRAVENOUS

## 2019-10-17 MED ORDER — FERROUS SULFATE 325 (65 FE) MG PO TABS: 325.0000 mg | ORAL_TABLET | Freq: Three times a day (TID) | ORAL | 2 refills | Status: DC

## 2019-10-17 MED ORDER — PROPOFOL 500 MG/50ML IV EMUL
INTRAVENOUS | Status: DC | PRN
  Administered 2019-10-17: 100 ug/kg/min via INTRAVENOUS

## 2019-10-17 MED ORDER — LACTATED RINGERS IV SOLN
INTRAVENOUS | Status: AC | PRN
  Administered 2019-10-17: 1000 mL via INTRAVENOUS

## 2019-10-17 MED ORDER — ONDANSETRON HCL 4 MG/2ML IJ SOLN
INTRAMUSCULAR | Status: DC | PRN
  Administered 2019-10-17: 4 mg via INTRAVENOUS

## 2019-10-17 MED ORDER — PROPOFOL 10 MG/ML IV BOLUS
INTRAVENOUS | Status: DC | PRN
  Administered 2019-10-17 (×2): 50 mg via INTRAVENOUS
  Administered 2019-10-17: 100 mg via INTRAVENOUS

## 2019-10-17 MED ORDER — PHENYLEPHRINE 40 MCG/ML (10ML) SYRINGE FOR IV PUSH (FOR BLOOD PRESSURE SUPPORT)
PREFILLED_SYRINGE | INTRAVENOUS | Status: DC | PRN
  Administered 2019-10-17: 120 ug via INTRAVENOUS
  Administered 2019-10-17: 80 ug via INTRAVENOUS

## 2019-10-17 SURGICAL SUPPLY — 25 items

## 2019-10-17 NOTE — Plan of Care (Signed)
  Problem: Education: Goal: Knowledge of General Education information will improve Description: Including pain rating scale, medication(s)/side effects and non-pharmacologic comfort measures Outcome: Progressing   Problem: Safety: Goal: Ability to remain free from injury will improve Outcome: Progressing   Problem: Skin Integrity: Goal: Risk for impaired skin integrity will decrease Outcome: Progressing   

## 2019-10-17 NOTE — Op Note (Signed)
[facility] Patient Name: Christina Mcbride Procedure Date : 10/17/2019 MRN: [id number] Attending MD: Estill Cotta. Christina Mcbride , MD Date of Birth: 09/07/1971 CSN: [id number] Age: 48 Admit Type: Outpatient Procedure:                Upper GI endoscopy Indications:              Unexplained iron deficiency anemia (years of anemia                            persisting after menopause within last year- no GI                            symptoms. No prior GI or hematology evaluation.                            Admitted with Hgb 3.5) Providers:                Mallie Mussel L. Christina Carrow, MD, Ashley Jacobs, RN, Marguerita Merles, Technician, Vania Rea, CRNA Referring MD:             [facility] Medicines:                Monitored Anesthesia Care Complications:            No immediate complications. Estimated Blood Loss:     Estimated blood loss was minimal. Procedure:                Pre-Anesthesia Assessment:                           - Prior to the procedure, a History and Physical                            was performed, and patient medications and                            allergies were reviewed. The patient's tolerance of                            previous anesthesia was also reviewed. The risks                            and benefits of the procedure and the sedation                            options and risks were discussed with the patient.                            All questions were answered, and informed consent                            was obtained. Prior Anticoagulants: The patient has  taken no previous anticoagulant or antiplatelet                            agents. ASA Grade Assessment: II - A patient with                            mild systemic disease. After reviewing the risks                            and benefits, the patient was deemed in                            satisfactory condition to undergo the procedure.                       After obtaining informed consent, the endoscope was                            passed under direct vision. Throughout the                            procedure, the patient's blood pressure, pulse, and                            oxygen saturations were monitored continuously. The                            [id number]) Olympus gastroscope was                            introduced through the mouth, and advanced to the                            third part of duodenum. The upper GI endoscopy was                            accomplished without difficulty. The patient                            tolerated the procedure well. Scope In: Scope Out: Findings:      The esophagus was normal.      A medium-sized para-esophageal hiatal hernia was present. No erosions       were seen within the hernia sac.      The exam of the stomach was otherwise normal.      The examined duodenum was normal. Biopsies for histology were taken with       a cold forceps for evaluation of celiac disease. Impression:               - Normal esophagus.                           - Medium-sized hiatal hernia.                           - Normal examined duodenum. Biopsied.  Recommendation:           - Return patient to referring hospital for possible                            discharge same day.                           - Resume regular diet.                           - See the other procedure note for documentation of                            additional recommendations. Procedure Code(s):        --- Professional ---                           [phone], Esophagogastroduodenoscopy, flexible,                            transoral; with biopsy, single or multiple Diagnosis Code(s):        --- Professional ---                           K44.9, Diaphragmatic hernia without obstruction or                            gangrene                           D50.9, Iron deficiency anemia, unspecified CPT  copyright 2019 American Medical Association. All rights reserved. The codes documented in this report are preliminary and upon coder review may  be revised to meet current compliance requirements. [name] L. Christina Carrow, MD 10/17/2019 2:08:40 PM This report has been signed electronically. Number of Addenda: 0

## 2019-10-17 NOTE — Interval H&P Note (Signed)
History and Physical Interval Note:  10/17/2019 1:19 PM  Christina Mcbride  has presented today for surgery, with the diagnosis of Anemia.  Blood detected in stool, no overt GI bleeding.  Has required transfusions periodically since 2014..  The various methods of treatment have been discussed with the patient and family. After consideration of risks, benefits and other options for treatment, the patient has consented to  Procedure(s): COLONOSCOPY WITH PROPOFOL (N/A) ESOPHAGOGASTRODUODENOSCOPY (EGD) WITH PROPOFOL (N/A) as a surgical intervention.  The patient's history has been reviewed, patient examined, no change in status, stable for surgery.  I have reviewed the patient's chart and labs.  Questions were answered to the patient's satisfaction.    Hgb 10.5 today  [name]

## 2019-10-17 NOTE — Transfer of Care (Signed)
Immediate Anesthesia Transfer of Care Note  Patient: Noma Quijas  Procedure(s) Performed: COLONOSCOPY WITH PROPOFOL (N/A ) ESOPHAGOGASTRODUODENOSCOPY (EGD) WITH PROPOFOL (N/A ) BIOPSY  Patient Location: Endoscopy Unit  Anesthesia Type:MAC  Level of Consciousness: drowsy  Airway & Oxygen Therapy: Patient Spontanous Breathing and Patient connected to face mask oxygen  Post-op Assessment: Report given to RN and Post -op Vital signs reviewed and stable  Post vital signs: Reviewed and stable  Last Vitals:  Vitals Value Taken Time  BP    Temp 36.7 C 10/17/19 1406  Pulse 76 10/17/19 1408  Resp 22 10/17/19 1408  SpO2 100 % 10/17/19 1408  Vitals shown include unvalidated device data.  Last Pain:  Vitals:   10/17/19 1406  TempSrc: Temporal  PainSc:          Complications: No apparent anesthesia complications

## 2019-10-17 NOTE — Anesthesia Preprocedure Evaluation (Signed)
Anesthesia Evaluation  Patient identified by MRN, date of birth, ID band Patient awake    Reviewed: Allergy & Precautions, H&P , NPO status , Patient's Chart, lab work & pertinent test results, reviewed documented beta blocker date and time   Airway Mallampati: II  TM Distance: >3 FB Neck ROM: full    Dental no notable dental hx.    Pulmonary neg pulmonary ROS,    Pulmonary exam normal breath sounds clear to auscultation       Cardiovascular Exercise Tolerance: Good negative cardio ROS   Rhythm:regular Rate:Normal     Neuro/Psych  Headaches, PSYCHIATRIC DISORDERS Anxiety    GI/Hepatic Neg liver ROS, GERD  Medicated,  Endo/Other    Renal/GU negative Renal ROS  negative genitourinary   Musculoskeletal   Abdominal   Peds  Hematology  (+) Blood dyscrasia, anemia ,   Anesthesia Other Findings   Reproductive/Obstetrics negative OB ROS                             Anesthesia Physical Anesthesia Plan  ASA: III  Anesthesia Plan: General   Post-op Pain Management:    Induction: Intravenous  PONV Risk Score and Plan:   Airway Management Planned: Mask and Natural Airway  Additional Equipment:   Intra-op Plan:   Post-operative Plan:   Informed Consent: I have reviewed the patients History and Physical, chart, labs and discussed the procedure including the risks, benefits and alternatives for the proposed anesthesia with the patient or authorized representative who has indicated his/her understanding and acceptance.     Dental Advisory Given  Plan Discussed with: CRNA  Anesthesia Plan Comments:         Anesthesia Quick Evaluation

## 2019-10-17 NOTE — Anesthesia Procedure Notes (Signed)
Procedure Name: MAC Date/Time: 10/17/2019 1:39 PM Performed by: Orlie Dakin, CRNA Pre-anesthesia Checklist: Emergency Drugs available, Patient identified, Suction available and Patient being monitored Patient Re-evaluated:Patient Re-evaluated prior to induction Oxygen Delivery Method: Nasal cannula Preoxygenation: Pre-oxygenation with 100% oxygen Induction Type: IV induction Placement Confirmation: positive ETCO2

## 2019-10-17 NOTE — Progress Notes (Signed)
Discussed discharge summary with patient. Reviewed all medications with patient. IV removed. Patient ready for discharge.

## 2019-10-17 NOTE — Discharge Summary (Signed)
Physician Discharge Summary [facility]    Patient: Christina Mcbride                   Admit date: 10/15/2019   DOB: 12-24-1970             Discharge date:10/17/2019/12:09 PM [id number]                          PCP: Kerin Perna, NP  Disposition: Home   Recommendations for Outpatient Follow-up:    Follow up: in 1 week  Discharge Condition: Stable   Code Status:   Code Status: Full Code  Diet recommendation: Regular healthy diet   Discharge Diagnoses:    Principal Problem:   Symptomatic anemia Active Problems:   Severe anemia   History of Present Illness/ Hospital Course Christina Mcbride Summary:   Catriona Dutchis a 48 y.o.femalewith medical history significant foriron deficiency anemia, posttraumatic stress disorder, and microcytic anemia, now presenting to the emergency department for evaluation of shortness of breath and lightheadedness. Patient reports history of chronic orthostatic hypotension but this has been more severe in the past couple weeks, over which interval she has also developed new dyspnea with minimal exertion. She denies any hematochezia, attributes her dark stools to her iron supplementation, and denies any abdominal pain, nausea, or vomiting. She has not had a menstrual period in over a year. She has never had endoscopy. Has been admitted multiple times previously with symptomatic anemia requiring transfusions and with work-up consistent with iron deficiency anemia,during her last admission in August 2020, at which point her hemoglobin was 5.3, she was not iron deficient. At time of discharge in August, she was referredto hematologyby the discharging physician and she attempted to schedule this appointment,but was told thatthe referral had to come from her PCP; she has not yet gotten in to see her PCP.  ED Course: patient is found to be afebrile, saturating well on room air, tachycardic to 120, and with blood pressure  102/60. EKG features sinus tachycardia with rate 121.  Chest x-ray is negative for acute cardiopulmonary disease. Chemistry panel is unremarkable. CBC notable for leukocytosis to 11,900, thrombocytosis with platelets 786,000, and microcytic anemia with hemoglobin 3.6, down from 7.5 in August after she had been transfused.  Patient was given a liter of normal saline and 2 units of packed red blood cells were ordered from the ED. -COVID-19screening   -10/16/2019-patient received total of 4 units of packed red blood cells, vitals are stabilized, dizziness weakness tachycardia tachypnea and blood pressure is stabilized.  10/17/2019-status post GI prep for EGD colonoscopy, tolerated well Patient initiated on Pepcid 40 mg p.o. twice daily for now, to follow-up with PCP with repeat CBC in 5 to 7 days. Patient has been advised to avoid all NSAIDs  Prescription for Vicodin, as needed Tylenol for severe migraine headaches was given she to avoid NSAIDs including Ultram, aspirin.  Assessment & Plan:   Principal Problem:   Symptomatic anemia  Symptomatic anemia-in the setting of acute on chronic iron deficiency anemia/GI bleed -Status post total of 4 units of packed red blood cell transfusion yesterday tolerated well, tachycardia, tachypnea, blood pressure has improved -Hemoccult positive -10/17/19 GI prep ---> EGD/ colonoscopy with in normal limites    -On admission symptomatic with tachycardia heart rate as high as 115, mild hypotension blood pressure as low as 105/54, - progressive DOE and lightheadedness on standing, found to have Hgb of 3.6, down from 7.5 in  August (after she was transfused up from 5.3)  -Iron deficiency anemia - has been taking iron supplement and was not iron-deficient in August 2020 when she required transfusions -She has not noticed any bleeding, bruising, or change in bowel habits  -Check FOBT >> Hemoccult positive x2 -GI consulted:  Status post GI prep, EGD  colonoscopy 10/17/2019   -  Anemia panel; consistent with iron deficiency -Total iron 23 low, TIBC 432, sat ratio of 7 low, ferritin 15 normal, folate normal at 18.9 B12 normal 589,    -To continue ferrous sulfate 325 mg p.o. 3 times daily    -Blood smear: RBC morphology, probably chronic, normal reticulocyte count 0.7, absolute reticulocyte count 22.9, immature reticulocyte fracture elevated 27.7,  -Patient is advised to follow with hematologist as an outpatient for further evaluation and recommendations   PTSD -Continue Lexapro     Code Status:Full Family Communication:Discussed with patient Consults called:GI Admission status:Observation  Disposition -to be discharged home with specific instructions to avoid NSAIDs, follow-up with hematologist and PCP, continue PPI and iron supplements   Procedures:  10/17/2019 status post EGD colonoscopy   Discharge Instructions:   Discharge Instructions    Activity as tolerated - No restrictions   Complete by: As directed    Diet - low sodium heart healthy   Complete by: As directed    Discharge instructions   Complete by: As directed    Try to avoid all NSAID including aspirin, Ultram, may use non-NSAID medication for your migraine headaches Continue Pepcid 40 mg twice a day for at least 2 weeks then follow-up with your PCP and GI if no further bleeding may reduce it to once a day.   Increase activity slowly   Complete by: As directed        Medication List    STOP taking these medications   aspirin-acetaminophen-caffeine 250-250-65 MG tablet Commonly known as: EXCEDRIN MIGRAINE   traMADol 50 MG tablet Commonly known as: ULTRAM     TAKE these medications   acetaminophen 325 MG tablet Commonly known as: TYLENOL Take 2 tablets (650 mg total) by mouth every 6 (six) hours as needed for up to 20 days for mild pain (or Fever >/= 101).   Centrum MultiGummies Chew Chew 1-2 tablets by mouth daily.     diphenhydrAMINE 25 mg capsule Commonly known as: BENADRYL Take 25-50 mg by mouth as needed (for allergic reactions).   escitalopram 20 MG tablet Commonly known as: LEXAPRO Take 1 tablet (20 mg total) by mouth daily. What changed: when to take this   famotidine 20 MG tablet Commonly known as: PEPCID Take 2 tablets (40 mg total) by mouth 2 (two) times daily.   ferrous sulfate 325 (65 FE) MG tablet Take 1 tablet (325 mg total) by mouth 3 (three) times daily with meals.   folic acid 1 MG tablet Commonly known as: FOLVITE Take 1 tablet (1 mg total) by mouth daily.   HYDROcodone-acetaminophen 5-325 MG tablet Commonly known as: NORCO/VICODIN Take 1-2 tablets by mouth every 6 (six) hours as needed for up to 10 days for severe pain (For severe migraine headaches).   meclizine 25 MG tablet Commonly known as: ANTIVERT Take 1 tablet (25 mg total) by mouth 2 (two) times daily as needed for dizziness.   SUMAtriptan 50 MG tablet Commonly known as: IMITREX Take 50 mg by mouth every 2 (two) hours as needed for migraine or headache (and may repeat in 2 hours if headache persists or recurs).  Allergies  Allergen Reactions   Bee Venom Anaphylaxis     Procedures /Studies:   Dg Chest Portable 1 View  Result Date: 10/15/2019 CLINICAL DATA:  Shortness of breath EXAM: PORTABLE CHEST 1 VIEW COMPARISON:  07/19/2019 FINDINGS: Moderate-sized hiatal hernia. Heart and mediastinal contours are within normal limits. No focal opacities or effusions. No acute bony abnormality. IMPRESSION: Hiatal hernia.  No active cardiopulmonary disease. Electronically Signed   By: Rolm Baptise M.D.   On: 10/15/2019 22:35     Subjective:   Patient was seen and examined 10/17/2019, 12:09 PM Patient stable today. No acute distress.  No issues overnight Stable for discharge.  Discharge Exam:    Vitals:   10/16/19 2041 10/16/19 2342 10/17/19 0420 10/17/19 1132  BP: 131/70 133/79 124/67 (!) 162/79   Pulse: 73 92 98 80  Resp: 16 20 20 11   Temp: 98.4 F (36.9 C) 98.4 F (36.9 C) 98.6 F (37 C) 98 F (36.7 C)  TempSrc: Oral  Oral Temporal  SpO2: 100% 100% 99% 97%  Weight:    104.3 kg  Height:        General: Pt lying comfortably in bed & appears in no obvious distress. Cardiovascular: S1 & S2 heard, RRR, S1/S2 +. No murmurs, rubs, gallops or clicks. No JVD or pedal edema. Respiratory: Clear to auscultation without wheezing, rhonchi or crackles. No increased work of breathing. Abdominal:  Non-distended, non-tender & soft. No organomegaly or masses appreciated. Normal bowel sounds heard. CNS: Alert and oriented. No focal deficits. Extremities: no edema, no cyanosis    The results of significant diagnostics from this hospitalization (including imaging, microbiology, ancillary and laboratory) are listed below for reference.      Microbiology:   Recent Results (from the past 240 hour(s))  SARS CORONAVIRUS 2 (TAT 6-24 HRS) Nasopharyngeal Nasopharyngeal Swab     Status: None   Collection Time: 10/16/19  4:35 AM   Specimen: Nasopharyngeal Swab  Result Value Ref Range Status   SARS Coronavirus 2 NEGATIVE NEGATIVE Final    Comment: (NOTE) SARS-CoV-2 target nucleic acids are NOT DETECTED. The SARS-CoV-2 RNA is generally detectable in upper and lower respiratory specimens during the acute phase of infection. Negative results do not preclude SARS-CoV-2 infection, do not rule out co-infections with other pathogens, and should not be used as the sole basis for treatment or other patient management decisions. Negative results must be combined with clinical observations, patient history, and epidemiological information. The expected result is Negative. Fact Sheet for Patients: SugarRoll.be Fact Sheet for Healthcare Providers: https://www.woods-mathews.com/ This test is not yet approved or cleared by the [place] FDA and  has been  authorized for detection and/or diagnosis of SARS-CoV-2 by FDA under an Emergency Use Authorization (EUA). This EUA will remain  in effect (meaning this test can be used) for the duration of the COVID-19 declaration under Section 56 4(b)(1) of the Act, 21 U.S.C. section 360bbb-3(b)(1), unless the authorization is terminated or revoked sooner. Performed at [facility], [place] [address], [place], [place] [address]      Labs:   CBC: Recent Labs  Lab 10/16/19 0250 10/16/19 1047 10/17/19 0148  WBC 11.9* 8.9  --   NEUTROABS  --  6.7  --   HGB 3.6* 7.3* 10.5*  HCT 13.2* 26.5* 33.4*  MCV 71.0* 75.3*  --   PLT 786* 612*  --    Basic Metabolic Panel: Recent Labs  Lab 10/16/19 0250  NA 139  K 4.0  CL 108  CO2 23  GLUCOSE 102*  BUN 14  CREATININE 0.91  CALCIUM 8.6*   Liver Function Tests: Recent Labs  Lab 10/16/19 0250  AST 12*  ALT 9  ALKPHOS 70  BILITOT <0.1*  PROT 7.3  ALBUMIN 3.1*   BNP (last 3 results) Recent Labs    11/28/18 2135  BNP 44.3   Cardi Anemia work up Recent Labs    10/16/19 1047  VITAMINB12 589  FOLATE 18.9  FERRITIN 15  TIBC 342  IRON 23*  RETICCTPCT 0.7   Urinalysis    Component Value Date/Time   COLORURINE YELLOW 11/29/2018 0215   APPEARANCEUR HAZY (A) 11/29/2018 0215   LABSPEC 1.025 11/29/2018 0215   PHURINE 5.0 11/29/2018 0215   GLUCOSEU NEGATIVE 11/29/2018 0215   HGBUR SMALL (A) 11/29/2018 0215   BILIRUBINUR NEGATIVE 11/29/2018 0215   KETONESUR NEGATIVE 11/29/2018 0215   PROTEINUR NEGATIVE 11/29/2018 0215   UROBILINOGEN 0.2 07/10/2015 1115   NITRITE NEGATIVE 11/29/2018 0215   LEUKOCYTESUR NEGATIVE 11/29/2018 0215    Time coordinating discharge: Over 48 minutes  SIGNED: Deatra James, MD, FACP, FHM. [facility],  Pager [phone]  If 7PM-7AM, please contact night-coverage [email or web address] 10/17/2019, 12:09 PM

## 2019-10-17 NOTE — Progress Notes (Signed)
Pt encouraged to drink prep. Pt stated "I will try,I kept dosing on and off,I am tired"

## 2019-10-17 NOTE — Progress Notes (Signed)
EGD /colonoscopy done.  No source anemia found.  Significance (and accuracy) of reported heme positive stool unclear, as multiple prior negative stool heme studies, and years of IDA.  We will arrange office follow up with me.  Please also make a referral to outpatient hematology.Needs regular hemoglobin checks and oral iron has not worked for her.

## 2019-10-17 NOTE — Op Note (Signed)
[facility] Patient Name: Christina Mcbride Procedure Date : 10/17/2019 MRN: [id number] Attending MD: Estill Cotta. [name] , MD Date of Birth: 19-Nov-1971 CSN: [id number] Age: 48 Admit Type: Inpatient Procedure:                Colonoscopy Indications:              Unexplained iron deficiency anemia (see EGD report                            and hospital consult note for details) Providers:                Mallie Mussel L. [name], MD, Ashley Jacobs, RN, Marguerita Merles, Technician Referring MD:             [facility] Medicines:                Monitored Anesthesia Care Complications:            No immediate complications. Estimated Blood Loss:     Estimated blood loss: none. Procedure:                Pre-Anesthesia Assessment:                           - Prior to the procedure, a History and Physical                            was performed, and patient medications and                            allergies were reviewed. The patient's tolerance of                            previous anesthesia was also reviewed. The risks                            and benefits of the procedure and the sedation                            options and risks were discussed with the patient.                            All questions were answered, and informed consent                            was obtained. Prior Anticoagulants: The patient has                            taken no previous anticoagulant or antiplatelet                            agents. ASA Grade Assessment: II - A patient with  mild systemic disease. After reviewing the risks                            and benefits, the patient was deemed in                            satisfactory condition to undergo the procedure.                           - Prior to the procedure, a History and Physical                            was performed, and patient medications and   allergies were reviewed. The patient's tolerance of                            previous anesthesia was also reviewed. The risks                            and benefits of the procedure and the sedation                            options and risks were discussed with the patient.                            All questions were answered, and informed consent                            was obtained. Prior Anticoagulants: The patient has                            taken no previous anticoagulant or antiplatelet                            agents. ASA Grade Assessment: II - A patient with                            mild systemic disease. After reviewing the risks                            and benefits, the patient was deemed in                            satisfactory condition to undergo the procedure.                           After obtaining informed consent, the colonoscope                            was passed under direct vision. Throughout the                            procedure, the patient's blood pressure, pulse, and  oxygen saturations were monitored continuously. The                            [id number]) Olympus colonoscope was                            introduced through the anus and advanced to the the                            terminal ileum, with identification of the                            appendiceal orifice and IC valve. The colonoscopy                            was performed without difficulty. The patient                            tolerated the procedure well. The quality of the                            bowel preparation was fair (PATIENT DID NOT                            COMPLETE BOWEL PREPARATION SOLUTION AS ORDERED).                            Extensive lavage performed.                           The terminal ileum, ileocecal valve, appendiceal                            orifice, and rectum were photographed. The bowel                             preparation used was MoviPrep via split dose                            instruction. Scope In: 1:46:36 PM Scope Out: 2:02:33 PM Scope Withdrawal Time: 0 hours 13 minutes 0 seconds  Total Procedure Duration: 0 hours 15 minutes 57 seconds  Findings:      The perianal and digital rectal examinations were normal.      The terminal ileum appeared normal.      The exam was otherwise without abnormality on direct and retroflexion       views. Impression:               - Preparation of the colon was fair.                           - The examined portion of the ileum was normal.                           - The examination was otherwise normal on direct  and retroflexion views.                           - No specimens collected.                           The prep quality after lavage was sufficient to                            exclude sources of IDA, however small polyps may                            not have been seen. Recommendation:           - Return patient to hospital ward for possible                            discharge same day.                           - Resume regular diet.                           - Repeat colonoscopy in 1 year for screening                            purposes due to fair prep.                           - Patient needs outpatient Hematology evaluation                            for close monitoring of blood counts and periodic                            administration of IV iron. Oral iron has not                            improved the anemia.                           - See the other procedure note for documentation of                            additional recommendations. Procedure Code(s):        --- Professional ---                           [phone], Colonoscopy, flexible; diagnostic, including                            collection of specimen(s) by brushing or washing,                            when performed  (separate procedure) Diagnosis Code(s):        --- Professional ---  D50.9, Iron deficiency anemia, unspecified CPT copyright 2019 American Medical Association. All rights reserved. The codes documented in this report are preliminary and upon coder review may  be revised to meet current compliance requirements. Ronika Kelson L. [name], MD 10/17/2019 2:13:26 PM This report has been signed electronically. Number of Addenda: 0

## 2019-10-18 LAB — HAPTOGLOBIN: Haptoglobin: 260 mg/dL (ref 42–296)

## 2019-10-19 ENCOUNTER — Encounter (HOSPITAL_COMMUNITY): Payer: Self-pay | Admitting: Gastroenterology

## 2019-10-19 NOTE — Anesthesia Postprocedure Evaluation (Signed)
Anesthesia Post Note  Patient: Dafna Romo  Procedure(s) Performed: COLONOSCOPY WITH PROPOFOL (N/A ) ESOPHAGOGASTRODUODENOSCOPY (EGD) WITH PROPOFOL (N/A ) BIOPSY     Patient location during evaluation: PACU Anesthesia Type: MAC Level of consciousness: awake and alert Pain management: pain level controlled Vital Signs Assessment: post-procedure vital signs reviewed and stable Respiratory status: spontaneous breathing, nonlabored ventilation, respiratory function stable and patient connected to nasal cannula oxygen Cardiovascular status: stable and blood pressure returned to baseline Postop Assessment: no apparent nausea or vomiting Anesthetic complications: no    Last Vitals:  Vitals:   10/17/19 1426 10/17/19 1427  BP:    Pulse:    Resp: 11 13  Temp:    SpO2:      Last Pain:  Vitals:   10/17/19 1406  TempSrc: Temporal  PainSc:                  Almedia Cordell

## 2019-10-20 LAB — SURGICAL PATHOLOGY

## 2019-10-27 ENCOUNTER — Other Ambulatory Visit: Payer: Self-pay | Admitting: *Deleted

## 2019-10-27 ENCOUNTER — Encounter: Payer: Self-pay | Admitting: *Deleted

## 2019-10-27 DIAGNOSIS — D509 Iron deficiency anemia, unspecified: Secondary | ICD-10-CM

## 2019-11-03 ENCOUNTER — Other Ambulatory Visit: Payer: Self-pay

## 2019-11-04 ENCOUNTER — Inpatient Hospital Stay: Payer: BLUE CROSS/BLUE SHIELD

## 2019-11-04 ENCOUNTER — Other Ambulatory Visit: Payer: Self-pay

## 2019-11-04 ENCOUNTER — Encounter: Payer: Self-pay | Admitting: Internal Medicine

## 2019-11-04 ENCOUNTER — Inpatient Hospital Stay: Payer: BLUE CROSS/BLUE SHIELD | Attending: Internal Medicine | Admitting: Internal Medicine

## 2019-11-04 VITALS — BP 126/86 | HR 83 | Temp 98.3°F | Wt 255.0 lb

## 2019-11-04 DIAGNOSIS — D649 Anemia, unspecified: Secondary | ICD-10-CM

## 2019-11-04 DIAGNOSIS — D500 Iron deficiency anemia secondary to blood loss (chronic): Secondary | ICD-10-CM | POA: Insufficient documentation

## 2019-11-04 DIAGNOSIS — G43909 Migraine, unspecified, not intractable, without status migrainosus: Secondary | ICD-10-CM | POA: Insufficient documentation

## 2019-11-04 DIAGNOSIS — K921 Melena: Secondary | ICD-10-CM | POA: Insufficient documentation

## 2019-11-04 DIAGNOSIS — D509 Iron deficiency anemia, unspecified: Secondary | ICD-10-CM

## 2019-11-04 DIAGNOSIS — F4310 Post-traumatic stress disorder, unspecified: Secondary | ICD-10-CM | POA: Diagnosis not present

## 2019-11-04 DIAGNOSIS — Z79899 Other long term (current) drug therapy: Secondary | ICD-10-CM | POA: Insufficient documentation

## 2019-11-04 DIAGNOSIS — Z809 Family history of malignant neoplasm, unspecified: Secondary | ICD-10-CM | POA: Insufficient documentation

## 2019-11-04 DIAGNOSIS — E079 Disorder of thyroid, unspecified: Secondary | ICD-10-CM | POA: Insufficient documentation

## 2019-11-04 DIAGNOSIS — K449 Diaphragmatic hernia without obstruction or gangrene: Secondary | ICD-10-CM | POA: Diagnosis not present

## 2019-11-04 LAB — CBC WITH DIFFERENTIAL/PLATELET
Abs Immature Granulocytes: 0.05 10*3/uL (ref 0.00–0.07)
Basophils Absolute: 0.1 10*3/uL (ref 0.0–0.1)
Basophils Relative: 1 %
Eosinophils Absolute: 0.2 10*3/uL (ref 0.0–0.5)
Eosinophils Relative: 2 %
HCT: 34 % — ABNORMAL LOW (ref 36.0–46.0)
Hemoglobin: 9.8 g/dL — ABNORMAL LOW (ref 12.0–15.0)
Immature Granulocytes: 1 %
Lymphocytes Relative: 18 %
Lymphs Abs: 1.8 10*3/uL (ref 0.7–4.0)
MCH: 22.2 pg — ABNORMAL LOW (ref 26.0–34.0)
MCHC: 28.8 g/dL — ABNORMAL LOW (ref 30.0–36.0)
MCV: 76.9 fL — ABNORMAL LOW (ref 80.0–100.0)
Monocytes Absolute: 0.5 10*3/uL (ref 0.1–1.0)
Monocytes Relative: 5 %
Neutro Abs: 7.2 10*3/uL (ref 1.7–7.7)
Neutrophils Relative %: 73 %
Platelets: 733 10*3/uL — ABNORMAL HIGH (ref 150–400)
RBC: 4.42 MIL/uL (ref 3.87–5.11)
RDW: 23.5 % — ABNORMAL HIGH (ref 11.5–15.5)
WBC: 9.8 10*3/uL (ref 4.0–10.5)
nRBC: 0 % (ref 0.0–0.2)

## 2019-11-04 LAB — LACTATE DEHYDROGENASE: LDH: 129 U/L (ref 98–192)

## 2019-11-04 NOTE — Assessment & Plan Note (Addendum)
#   Severe iron deficient anemia-question etiology [see below]; recommend IV iron/continue p.o. iron.  LDH is normal; no concerns for hemolysis.  Check CBC [facility] panel.  # Iron deficiency secondary to possible blood loss [FOBT positive]; recommend capsule study.  I have informed patient's GI doctor re: need for capsule study.  Insurance declining CT of the abdomen pelvis.   # Thank you, Ms. Edwards for allowing me to participate in the care of your pleasant patient. Please do not hesitate to contact me with questions or concerns in the interim.  Once patient's clinical status is stable/improved-she would be referred to [facility] as this is closer to her home.  Discussed with patient in agreement.   # DISPOSITION:  # labs today;ordered # proceed with venofer weekly x4; as soon as possible.  # 2 weeks- MD; no labs- venofer-Dr.B

## 2019-11-04 NOTE — Progress Notes (Signed)
[facility] CONSULT NOTE  Patient Care Team: Kerin Perna, NP as PCP - General (Internal Medicine)  CHIEF COMPLAINTS/PURPOSE OF CONSULTATION: severe anemia  HEMATOLOGY HISTORY  # IRON DEFICIENCY ANEMIA [since 2015; NOV 2020 nadir Hb- 3.6-] NOV 2020-EGD/colonoscopy- unremarkable [Dr.Danis; [facility]]  # Migraines/ PTSD  HISTORY OF PRESENTING ILLNESS:  Christina Mcbride 48 y.o.  female has been referred to [place] for further evaluation/management for anemia.  Patient is a history of iron deficient anemia of unclear etiology was recently admitted hospital in [place]-for severe anemia hemoglobin of 3.7.  Patient received 4 units of PRBC transfusion; patient had upper and lower endoscopies that were fairly unremarkable.  In the time of discharge her hemoglobin was around 10.  Interestingly patient's stool occult blood was positive in the hospital.  Patient states that she had been diagnosed with anemia since 2012 when she started to develop migraine headaches.   Blood in stools: None Change in bowel habits- None Blood in urine: None Difficulty swallowing: None Abnormal weight loss: None Iron supplementation: iron TID Prior Blood transfusions: multiple blood transfusion.  Vaginal bleeding: None  Review of Systems  Constitutional: Positive for malaise/fatigue. Negative for chills, diaphoresis, fever and weight loss.  HENT: Negative for nosebleeds and sore throat.   Eyes: Negative for double vision.  Respiratory: Positive for shortness of breath. Negative for cough, hemoptysis, sputum production and wheezing.   Cardiovascular: Negative for chest pain, palpitations, orthopnea and leg swelling.  Gastrointestinal: Negative for abdominal pain, blood in stool, constipation, diarrhea, heartburn, melena, nausea and vomiting.  Genitourinary: Negative for dysuria, frequency and urgency.  Musculoskeletal: Positive for joint pain. Negative for back pain.  Skin: Negative.  Negative  for itching and rash.  Neurological: Positive for dizziness. Negative for tingling, focal weakness, weakness and headaches.  Endo/Heme/Allergies: Does not bruise/bleed easily.  Psychiatric/Behavioral: Negative for depression. The patient is not nervous/anxious and does not have insomnia.    MEDICAL HISTORY:  Past Medical History:  Diagnosis Date  . Amenia 11/2018  . Blood transfusion without reported diagnosis   . Migraine   . PTSD (post-traumatic stress disorder)    reports daughter was murdered   . Tachycardia   . Thyroid disease   . Vitamin D deficiency     SURGICAL HISTORY: Past Surgical History:  Procedure Laterality Date  . BIOPSY  10/17/2019   Procedure: BIOPSY;  Surgeon: Doran Stabler, MD;  Location: [facility];  Service: Gastroenterology;;  . COLONOSCOPY WITH PROPOFOL N/A 10/17/2019   Procedure: COLONOSCOPY WITH PROPOFOL;  Surgeon: Doran Stabler, MD;  Location: [facility];  Service: Gastroenterology;  Laterality: N/A;  . ESOPHAGOGASTRODUODENOSCOPY (EGD) WITH PROPOFOL N/A 10/17/2019   Procedure: ESOPHAGOGASTRODUODENOSCOPY (EGD) WITH PROPOFOL;  Surgeon: Doran Stabler, MD;  Location: [facility];  Service: Gastroenterology;  Laterality: N/A;  . TUBAL LIGATION      SOCIAL HISTORY: Social History   Socioeconomic History  . Marital status: Single    Spouse name: Not on file  . Number of children: Not on file  . Years of education: Not on file  . Highest education level: Not on file  Occupational History  . Not on file  Social Needs  . Financial resource strain: Not on file  . Food insecurity    Worry: Not on file    Inability: Not on file  . Transportation needs    Medical: Not on file    Non-medical: Not on file  Tobacco Use  . Smoking status: Never Smoker  .  Smokeless tobacco: Never Used  Substance and Sexual Activity  . Alcohol use: No  . Drug use: No  . Sexual activity: Not Currently    Birth control/protection: Surgical  Lifestyle   . Physical activity    Days per week: Not on file    Minutes per session: Not on file  . Stress: Not on file  Relationships  . Social Herbalist on phone: Not on file    Gets together: Not on file    Attends religious service: Not on file    Active member of club or organization: Not on file    Attends meetings of clubs or organizations: Not on file    Relationship status: Not on file  . Intimate partner violence    Fear of current or ex partner: Not on file    Emotionally abused: Not on file    Physically abused: Not on file    Forced sexual activity: Not on file  Other Topics Concern  . Not on file  Social History Narrative   Pt relocated to [place] from [place] around 2012.      Lives in [place]; with significant other. 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     FAMILY HISTORY: Family History  Problem Relation Age of Onset  . Hypertension Mother   . Arthritis Mother   . Hypertension Son   . Cancer Maternal Aunt   . Diabetes Maternal Grandmother     ALLERGIES:  is allergic to bee venom.  MEDICATIONS:  Current Outpatient Medications  Medication Sig Dispense Refill  . acetaminophen (TYLENOL) 325 MG tablet Take 2 tablets (650 mg total) by mouth every 6 (six) hours as needed for up to 20 days for mild pain (or Fever >/= 101). 20 tablet 0  . diphenhydrAMINE (BENADRYL) 25 mg capsule Take 25-50 mg by mouth as needed (for allergic reactions).    Marland Kitchen escitalopram (LEXAPRO) 20 MG tablet Take 1 tablet (20 mg total) by mouth daily. (Patient taking differently: Take 20 mg by mouth at bedtime. ) 30 tablet 3  . famotidine (PEPCID) 20 MG tablet Take 2 tablets (40 mg total) by mouth 2 (two) times daily. 60 tablet 1  . ferrous sulfate 325 (65 FE) MG tablet Take 1 tablet (325 mg total) by mouth 3 (three) times daily with meals. 90 tablet 2  . folic acid (FOLVITE) 1 MG tablet Take 1 tablet (1 mg total) by mouth daily. 30 tablet 3  . meclizine (ANTIVERT) 25  MG tablet Take 1 tablet (25 mg total) by mouth 2 (two) times daily as needed for dizziness. 30 tablet 1  . Multiple Vitamins-Minerals (CENTRUM MULTIGUMMIES) CHEW Chew 1-2 tablets by mouth daily.    . SUMAtriptan (IMITREX) 50 MG tablet Take 50 mg by mouth every 2 (two) hours as needed for migraine or headache (and may repeat in 2 hours if headache persists or recurs).     No current facility-administered medications for this visit.     PHYSICAL EXAMINATION:   Vitals:   11/04/19 1508  BP: 126/86  Pulse: 83  Temp: 98.3 F (36.8 C)   Filed Weights   11/04/19 1508  Weight: 255 lb (115.7 kg)    Physical Exam  Constitutional: She is oriented to person, place, and time and well-developed, well-nourished, and in no distress.  HENT:  Head: Normocephalic and atraumatic.  Mouth/Throat: Oropharynx is clear and moist. No oropharyngeal exudate.  Eyes: Pupils are equal, round, and reactive to light.  Neck: Normal range of motion. Neck supple.  Cardiovascular: Normal rate and regular rhythm.  Pulmonary/Chest: No respiratory distress. She has no wheezes.  Abdominal: Soft. Bowel sounds are normal. She exhibits no distension and no mass. There is no abdominal tenderness. There is no rebound and no guarding.  Musculoskeletal: Normal range of motion.        General: No tenderness or edema.  Neurological: She is alert and oriented to person, place, and time.  Skin: Skin is warm.  Psychiatric: Affect normal.    LABORATORY DATA:  I have reviewed the data as listed Lab Results  Component Value Date   WBC 9.8 11/04/2019   HGB 9.8 (L) 11/04/2019   HCT 34.0 (L) 11/04/2019   MCV 76.9 (L) 11/04/2019   PLT 733 (H) 11/04/2019   Recent Labs    12/02/18 1137 07/19/19 1717 10/16/19 0250  NA 136 137 139  K 3.9 4.0 4.0  CL 105 105 108  CO2 24 23 23   GLUCOSE 80 104* 102*  BUN 9 9 14   CREATININE 0.87 0.86 0.91  CALCIUM 8.6* 8.7* 8.6*  GFRNONAA >60 >60 >60  GFRAA >60 >60 >60  PROT  --   --   7.3  ALBUMIN  --   --  3.1*  AST  --   --  12*  ALT  --   --  9  ALKPHOS  --   --  70  BILITOT  --   --  <0.1*     Dg Chest Portable 1 View  Result Date: 10/15/2019 CLINICAL DATA:  Shortness of breath EXAM: PORTABLE CHEST 1 VIEW COMPARISON:  07/19/2019 FINDINGS: Moderate-sized hiatal hernia. Heart and mediastinal contours are within normal limits. No focal opacities or effusions. No acute bony abnormality. IMPRESSION: Hiatal hernia.  No active cardiopulmonary disease. Electronically Signed   By: Rolm Baptise M.D.   On: 10/15/2019 22:35    Iron deficiency anemia due to chronic blood loss # Severe iron deficient anemia-question etiology [see below]; recommend IV iron/continue p.o. iron.  LDH is normal; no concerns for hemolysis.  Check CBC PNH panel.  # Iron deficiency secondary to possible blood loss [FOBT positive]; recommend capsule study.  I have informed patient's GI doctor re: need for capsule study.  Insurance declining CT of the abdomen pelvis.   # Thank you, Ms. Edwards for allowing me to participate in the care of your pleasant patient. Please do not hesitate to contact me with questions or concerns in the interim.  Once patient's clinical status is stable/improved-she would be referred to [facility] as this is closer to her home.  Discussed with patient in agreement.   # DISPOSITION:  # labs today;ordered # proceed with venofer weekly x4; as soon as possible.  # 2 weeks- MD; no labs- venofer-Dr.B  All questions were answered. The patient knows to call the clinic with any problems, questions or concerns.    Cammie Sickle, MD 11/04/2019 4:21 PM

## 2019-11-05 ENCOUNTER — Telehealth: Payer: Self-pay | Admitting: Gastroenterology

## 2019-11-05 LAB — HAPTOGLOBIN: Haptoglobin: 256 mg/dL (ref 42–296)

## 2019-11-05 NOTE — Telephone Encounter (Signed)
This patient recently had an EGD and colonoscopy in hospital for severe iron deficiency anemia.  She then saw hematology for IV iron therapy.  Because stool was heme positive on admission to hospital, the hematologist requests a small bowel video capsule study to rule out a small bowel source of blood loss.  Please contact patient about that and make arrangements.

## 2019-11-06 ENCOUNTER — Telehealth: Payer: Self-pay | Admitting: Internal Medicine

## 2019-11-06 ENCOUNTER — Encounter: Payer: Self-pay | Admitting: *Deleted

## 2019-11-06 NOTE — Telephone Encounter (Signed)
Discussed with Dr.Danis;GI  kindly agrees to arrange for capsule study.

## 2019-11-06 NOTE — Telephone Encounter (Signed)
Called patient, no answer, unable to LM (VM full). MyChart message sent. Awaiting return phone call.

## 2019-11-07 ENCOUNTER — Encounter: Payer: Self-pay | Admitting: *Deleted

## 2019-11-07 NOTE — Telephone Encounter (Signed)
Called patient for the second time, second day, no answer, unable to LM (VM full). MyChart message sent yesterday, the patient has not yet read the message. Continue to await return phone call.

## 2019-11-10 ENCOUNTER — Inpatient Hospital Stay: Payer: BLUE CROSS/BLUE SHIELD

## 2019-11-10 ENCOUNTER — Encounter: Payer: Self-pay | Admitting: *Deleted

## 2019-11-10 ENCOUNTER — Other Ambulatory Visit: Payer: Self-pay

## 2019-11-10 VITALS — BP 122/81 | HR 90 | Resp 18

## 2019-11-10 DIAGNOSIS — D500 Iron deficiency anemia secondary to blood loss (chronic): Secondary | ICD-10-CM | POA: Diagnosis not present

## 2019-11-10 LAB — PNH PROFILE (-HIGH SENSITIVITY)

## 2019-11-10 MED ORDER — SODIUM CHLORIDE 0.9 % IV SOLN
Freq: Once | INTRAVENOUS | Status: AC
  Administered 2019-11-10: 15:00:00 via INTRAVENOUS
  Filled 2019-11-10: qty 250

## 2019-11-10 MED ORDER — SODIUM CHLORIDE 0.9 % IV SOLN: 200.0000 mg | Freq: Once | INTRAVENOUS | Status: DC

## 2019-11-10 MED ORDER — IRON SUCROSE 20 MG/ML IV SOLN
200.0000 mg | Freq: Once | INTRAVENOUS | Status: AC
  Administered 2019-11-10: 200 mg via INTRAVENOUS
  Filled 2019-11-10: qty 10

## 2019-11-10 NOTE — Telephone Encounter (Signed)
FYI, Dr. [name],   Called patient for the third time, third day, no answer, unable to LM (VM full). MyChart message was sent to the patient and is documented as "read" at 6:15 pm on 11/07/19.   Letter mailed to the patient requesting a call to schedule her VCE.   Will continue to wait for the patient to call the office.

## 2019-11-10 NOTE — Telephone Encounter (Signed)
Understood.  She can contact us when she is ready.

## 2019-11-11 ENCOUNTER — Encounter (INDEPENDENT_AMBULATORY_CARE_PROVIDER_SITE_OTHER): Payer: Self-pay

## 2019-11-11 ENCOUNTER — Telehealth (INDEPENDENT_AMBULATORY_CARE_PROVIDER_SITE_OTHER): Payer: Medicare Other | Admitting: Primary Care

## 2019-11-17 ENCOUNTER — Other Ambulatory Visit: Payer: Self-pay

## 2019-11-17 ENCOUNTER — Ambulatory Visit: Payer: Medicare Other

## 2019-11-17 NOTE — Progress Notes (Signed)
Patient pre screened for office appointment, no questions or concerns today. Patient reminded of upcoming appointment time and date. 

## 2019-11-18 ENCOUNTER — Other Ambulatory Visit: Payer: Self-pay

## 2019-11-18 ENCOUNTER — Inpatient Hospital Stay (HOSPITAL_BASED_OUTPATIENT_CLINIC_OR_DEPARTMENT_OTHER): Payer: BLUE CROSS/BLUE SHIELD | Admitting: Internal Medicine

## 2019-11-18 ENCOUNTER — Inpatient Hospital Stay: Payer: BLUE CROSS/BLUE SHIELD

## 2019-11-18 VITALS — BP 109/83 | HR 84 | Temp 98.2°F | Resp 18

## 2019-11-18 DIAGNOSIS — D500 Iron deficiency anemia secondary to blood loss (chronic): Secondary | ICD-10-CM | POA: Diagnosis not present

## 2019-11-18 MED ORDER — IRON SUCROSE 20 MG/ML IV SOLN
200.0000 mg | Freq: Once | INTRAVENOUS | Status: AC
  Administered 2019-11-18: 200 mg via INTRAVENOUS
  Filled 2019-11-18: qty 10

## 2019-11-18 MED ORDER — SODIUM CHLORIDE 0.9 % IV SOLN
Freq: Once | INTRAVENOUS | Status: AC
  Filled 2019-11-18: qty 250

## 2019-11-18 NOTE — Progress Notes (Signed)
Pt tolerated infusion well. Pt denies any concerns or complaints at this time. No s/s of distress noted. Pt and VS stable at discharge.  

## 2019-11-18 NOTE — Progress Notes (Signed)
[facility] NOTE  Patient Care Team: Kerin Perna, NP as PCP - General (Internal Medicine)  CHIEF COMPLAINTS/PURPOSE OF CONSULTATION: severe anemia  HEMATOLOGY HISTORY  # IRON DEFICIENCY ANEMIA [since 2015; NOV 2020 nadir Hb- 3.6-s/p multiple PRBC transfusions] NOV 2020-EGD/colonoscopy- unremarkable [Dr.Danis; [facility]]; no menorrhagia. PNH-Normal; NO Hemolysis.   # Migraines/ PTSD  HISTORY OF PRESENTING ILLNESS:  [name] 48 y.o.  female severe iron deficiency of unclear etiology is here for follow-up.  Patient is currently status post Venofer x1.  Tolerated well.  No blood in stools no black or stools.  Nausea no vomiting.  Energy level slightly improved on iron.  Review of Systems  Constitutional: Positive for malaise/fatigue. Negative for chills, diaphoresis, fever and weight loss.  HENT: Negative for nosebleeds and sore throat.   Eyes: Negative for double vision.  Respiratory: Negative for cough, hemoptysis, sputum production and wheezing.   Cardiovascular: Negative for chest pain, palpitations, orthopnea and leg swelling.  Gastrointestinal: Negative for abdominal pain, blood in stool, constipation, diarrhea, heartburn, melena, nausea and vomiting.  Genitourinary: Negative for dysuria, frequency and urgency.  Musculoskeletal: Positive for joint pain. Negative for back pain.  Skin: Negative.  Negative for itching and rash.  Neurological: Negative for tingling, focal weakness, weakness and headaches.  Endo/Heme/Allergies: Does not bruise/bleed easily.  Psychiatric/Behavioral: Negative for depression. The patient is not nervous/anxious and does not have insomnia.    MEDICAL HISTORY:  Past Medical History:  Diagnosis Date  . Amenia 11/2018  . Blood transfusion without reported diagnosis   . Migraine   . PTSD (post-traumatic stress disorder)    reports daughter was murdered   . Tachycardia   . Thyroid disease   . Vitamin D deficiency      SURGICAL HISTORY: Past Surgical History:  Procedure Laterality Date  . BIOPSY  10/17/2019   Procedure: BIOPSY;  Surgeon: Doran Stabler, MD;  Location: [facility];  Service: Gastroenterology;;  . COLONOSCOPY WITH PROPOFOL N/A 10/17/2019   Procedure: COLONOSCOPY WITH PROPOFOL;  Surgeon: Doran Stabler, MD;  Location: [facility];  Service: Gastroenterology;  Laterality: N/A;  . ESOPHAGOGASTRODUODENOSCOPY (EGD) WITH PROPOFOL N/A 10/17/2019   Procedure: ESOPHAGOGASTRODUODENOSCOPY (EGD) WITH PROPOFOL;  Surgeon: Doran Stabler, MD;  Location: [facility];  Service: Gastroenterology;  Laterality: N/A;  . TUBAL LIGATION      SOCIAL HISTORY: Social History   Socioeconomic History  . Marital status: Single    Spouse name: Not on file  . Number of children: Not on file  . Years of education: Not on file  . Highest education level: Not on file  Occupational History  . Not on file  Tobacco Use  . Smoking status: Never Smoker  . Smokeless tobacco: Never Used  Substance and Sexual Activity  . Alcohol use: No  . Drug use: No  . Sexual activity: Not Currently    Birth control/protection: Surgical  Other Topics Concern  . Not on file  Social History Narrative   Pt relocated to [place] from [place] around 2012.      Lives in [place]; with significant other. 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    Social Determinants of Health   Financial Resource Strain:   . Difficulty of Paying Living Expenses: Not on file  Food Insecurity:   . Worried About Charity fundraiser in the Last Year: Not on file  . Ran Out of Food in the Last Year: Not on file  Transportation  Needs:   . Lack of Transportation (Medical): Not on file  . Lack of Transportation (Non-Medical): Not on file  Physical Activity:   . Days of Exercise per Week: Not on file  . Minutes of Exercise per Session: Not on file  Stress:   . Feeling of Stress : Not on file  Social Connections:    . Frequency of Communication with Friends and Family: Not on file  . Frequency of Social Gatherings with Friends and Family: Not on file  . Attends Religious Services: Not on file  . Active Member of Clubs or Organizations: Not on file  . Attends Archivist Meetings: Not on file  . Marital Status: Not on file  Intimate Partner Violence:   . Fear of Current or Ex-Partner: Not on file  . Emotionally Abused: Not on file  . Physically Abused: Not on file  . Sexually Abused: Not on file    FAMILY HISTORY: Family History  Problem Relation Age of Onset  . Hypertension Mother   . Arthritis Mother   . Hypertension Son   . Cancer Maternal Aunt   . Diabetes Maternal Grandmother     ALLERGIES:  is allergic to bee venom.  MEDICATIONS:  Current Outpatient Medications  Medication Sig Dispense Refill  . diphenhydrAMINE (BENADRYL) 25 mg capsule Take 25-50 mg by mouth as needed (for allergic reactions).    Marland Kitchen escitalopram (LEXAPRO) 20 MG tablet Take 1 tablet (20 mg total) by mouth daily. (Patient taking differently: Take 20 mg by mouth at bedtime. ) 30 tablet 3  . famotidine (PEPCID) 20 MG tablet Take 2 tablets (40 mg total) by mouth 2 (two) times daily. 60 tablet 1  . ferrous sulfate 325 (65 FE) MG tablet Take 1 tablet (325 mg total) by mouth 3 (three) times daily with meals. 90 tablet 2  . folic acid (FOLVITE) 1 MG tablet Take 1 tablet (1 mg total) by mouth daily. 30 tablet 3  . meclizine (ANTIVERT) 25 MG tablet Take 1 tablet (25 mg total) by mouth 2 (two) times daily as needed for dizziness. 30 tablet 1  . Multiple Vitamins-Minerals (CENTRUM MULTIGUMMIES) CHEW Chew 1-2 tablets by mouth daily.    . SUMAtriptan (IMITREX) 50 MG tablet Take 50 mg by mouth every 2 (two) hours as needed for migraine or headache (and may repeat in 2 hours if headache persists or recurs).     No current facility-administered medications for this visit.    PHYSICAL EXAMINATION:   Vitals:   11/18/19  1356  BP: 117/81  Pulse: 85  Temp: 97.7 F (36.5 C)   Filed Weights   11/18/19 1356  Weight: 258 lb (117 kg)    Physical Exam  Constitutional: She is oriented to person, place, and time and well-developed, well-nourished, and in no distress.  HENT:  Head: Normocephalic and atraumatic.  Mouth/Throat: Oropharynx is clear and moist. No oropharyngeal exudate.  Eyes: Pupils are equal, round, and reactive to light.  Cardiovascular: Normal rate and regular rhythm.  Pulmonary/Chest: No respiratory distress. She has no wheezes.  Abdominal: Soft. Bowel sounds are normal. She exhibits no distension and no mass. There is no abdominal tenderness. There is no rebound and no guarding.  Musculoskeletal:        General: No tenderness or edema. Normal range of motion.     Cervical back: Normal range of motion and neck supple.  Neurological: She is alert and oriented to person, place, and time.  Skin: Skin is warm.  Psychiatric: Affect normal.    LABORATORY DATA:  I have reviewed the data as listed Lab Results  Component Value Date   WBC 9.8 11/04/2019   HGB 9.8 (L) 11/04/2019   HCT 34.0 (L) 11/04/2019   MCV 76.9 (L) 11/04/2019   PLT 733 (H) 11/04/2019   Recent Labs    12/02/18 1137 07/19/19 1717 10/16/19 0250  NA 136 137 139  K 3.9 4.0 4.0  CL 105 105 108  CO2 24 23 23   GLUCOSE 80 104* 102*  BUN 9 9 14   CREATININE 0.87 0.86 0.91  CALCIUM 8.6* 8.7* 8.6*  GFRNONAA >60 >60 >60  GFRAA >60 >60 >60  PROT  --   --  7.3  ALBUMIN  --   --  3.1*  AST  --   --  12*  ALT  --   --  9  ALKPHOS  --   --  70  BILITOT  --   --  <0.1*     No results found.  Iron deficiency anemia due to chronic blood loss # Severe iron deficient anemia-question etiology- on IV venofer- Hb 9.8; proceed with venofer today.    # Iron deficiency secondary to possible blood loss [FOBT positive]; awaiting capsule study.  Insurance declining CT of the abdomen pelvis.   # DISPOSITION:  # venofer today as  planned;and as planned in next 2 week # 2 months- MD;cbc; possible venofer-Dr.B  All questions were answered. The patient knows to call the clinic with any problems, questions or concerns.    Cammie Sickle, MD 11/19/2019 7:58 AM

## 2019-11-18 NOTE — Assessment & Plan Note (Addendum)
#   Severe iron deficient anemia-question etiology- on IV venofer- Hb 9.8; proceed with venofer today.    # Iron deficiency secondary to possible blood loss [FOBT positive]; awaiting capsule study.  Insurance declining CT of the abdomen pelvis.   # DISPOSITION:  # venofer today as planned;and as planned in next 2 week # 2 months- MD;cbc; possible venofer-Dr.B

## 2019-11-19 ENCOUNTER — Other Ambulatory Visit: Payer: Self-pay

## 2019-11-24 ENCOUNTER — Inpatient Hospital Stay: Payer: BLUE CROSS/BLUE SHIELD

## 2019-11-24 ENCOUNTER — Other Ambulatory Visit: Payer: Self-pay

## 2019-11-24 VITALS — BP 121/78 | HR 84 | Temp 97.8°F | Resp 18

## 2019-11-24 DIAGNOSIS — D500 Iron deficiency anemia secondary to blood loss (chronic): Secondary | ICD-10-CM

## 2019-11-24 MED ORDER — IRON SUCROSE 20 MG/ML IV SOLN
200.0000 mg | Freq: Once | INTRAVENOUS | Status: AC
  Administered 2019-11-24: 200 mg via INTRAVENOUS
  Filled 2019-11-24: qty 10

## 2019-11-24 MED ORDER — SODIUM CHLORIDE 0.9 % IV SOLN
Freq: Once | INTRAVENOUS | Status: AC
  Filled 2019-11-24: qty 250

## 2019-11-24 NOTE — Progress Notes (Signed)
1455: Pt tolerated infusion well. Pt denies any concerns or complaints at this time. Pt and VS stable at time of discharge.

## 2019-12-01 ENCOUNTER — Other Ambulatory Visit: Payer: Self-pay

## 2019-12-01 ENCOUNTER — Inpatient Hospital Stay: Payer: BLUE CROSS/BLUE SHIELD | Attending: Internal Medicine

## 2019-12-01 VITALS — BP 135/85 | HR 82 | Temp 98.4°F | Resp 20

## 2019-12-01 DIAGNOSIS — D500 Iron deficiency anemia secondary to blood loss (chronic): Secondary | ICD-10-CM

## 2019-12-01 DIAGNOSIS — D509 Iron deficiency anemia, unspecified: Secondary | ICD-10-CM | POA: Insufficient documentation

## 2019-12-01 MED ORDER — SODIUM CHLORIDE 0.9 % IV SOLN
Freq: Once | INTRAVENOUS | Status: AC
  Filled 2019-12-01: qty 250

## 2019-12-01 MED ORDER — IRON SUCROSE 20 MG/ML IV SOLN
200.0000 mg | Freq: Once | INTRAVENOUS | Status: AC
  Administered 2019-12-01: 200 mg via INTRAVENOUS
  Filled 2019-12-01: qty 10

## 2020-01-20 ENCOUNTER — Other Ambulatory Visit: Payer: Self-pay

## 2020-01-20 ENCOUNTER — Encounter: Payer: Self-pay | Admitting: Internal Medicine

## 2020-01-20 ENCOUNTER — Inpatient Hospital Stay: Payer: BLUE CROSS/BLUE SHIELD

## 2020-01-20 ENCOUNTER — Inpatient Hospital Stay: Payer: BLUE CROSS/BLUE SHIELD | Attending: Internal Medicine | Admitting: Internal Medicine

## 2020-01-20 VITALS — BP 115/57 | HR 67 | Temp 98.0°F | Resp 18

## 2020-01-20 DIAGNOSIS — D500 Iron deficiency anemia secondary to blood loss (chronic): Secondary | ICD-10-CM

## 2020-01-20 DIAGNOSIS — R58 Hemorrhage, not elsewhere classified: Secondary | ICD-10-CM | POA: Diagnosis present

## 2020-01-20 LAB — CBC WITH DIFFERENTIAL/PLATELET
Abs Immature Granulocytes: 0.03 10*3/uL (ref 0.00–0.07)
Basophils Absolute: 0.1 10*3/uL (ref 0.0–0.1)
Basophils Relative: 1 %
Eosinophils Absolute: 0.2 10*3/uL (ref 0.0–0.5)
Eosinophils Relative: 2 %
HCT: 36.2 % (ref 36.0–46.0)
Hemoglobin: 10.3 g/dL — ABNORMAL LOW (ref 12.0–15.0)
Immature Granulocytes: 0 %
Lymphocytes Relative: 25 %
Lymphs Abs: 2.1 10*3/uL (ref 0.7–4.0)
MCH: 21.9 pg — ABNORMAL LOW (ref 26.0–34.0)
MCHC: 28.5 g/dL — ABNORMAL LOW (ref 30.0–36.0)
MCV: 76.9 fL — ABNORMAL LOW (ref 80.0–100.0)
Monocytes Absolute: 0.6 10*3/uL (ref 0.1–1.0)
Monocytes Relative: 7 %
Neutro Abs: 5.2 10*3/uL (ref 1.7–7.7)
Neutrophils Relative %: 65 %
Platelets: 596 10*3/uL — ABNORMAL HIGH (ref 150–400)
RBC: 4.71 MIL/uL (ref 3.87–5.11)
RDW: 18.2 % — ABNORMAL HIGH (ref 11.5–15.5)
WBC: 8.1 10*3/uL (ref 4.0–10.5)
nRBC: 0 % (ref 0.0–0.2)

## 2020-01-20 MED ORDER — IRON SUCROSE 20 MG/ML IV SOLN
200.0000 mg | Freq: Once | INTRAVENOUS | Status: AC
  Administered 2020-01-20: 200 mg via INTRAVENOUS
  Filled 2020-01-20: qty 10

## 2020-01-20 MED ORDER — SODIUM CHLORIDE 0.9 % IV SOLN
Freq: Once | INTRAVENOUS | Status: AC
  Filled 2020-01-20: qty 250

## 2020-01-20 NOTE — Progress Notes (Signed)
[facility] NOTE  Patient Care Team: Kerin Perna, NP as PCP - General (Internal Medicine)  CHIEF COMPLAINTS/PURPOSE OF CONSULTATION: severe anemia  HEMATOLOGY HISTORY  # IRON DEFICIENCY ANEMIA [since 2015; NOV 2020 nadir Hb- 3.6-s/p multiple PRBC transfusions] NOV 2020-EGD/colonoscopy- unremarkable [Dr.Danis; [facility]]; no menorrhagia. PNH-Normal; NO Hemolysis.   # Migraines/ PTSD  HISTORY OF PRESENTING ILLNESS:  Christina Mcbride 49 y.o.  female severe iron deficiency of unclear etiology is here for follow-up.  Patient is tolerating IV Venofer fairly well.  Notes her improvement of her energy levels.  No blood in stools or black or stools.  No nausea no vomiting.  Review of Systems  Constitutional: Positive for malaise/fatigue. Negative for chills, diaphoresis, fever and weight loss.  HENT: Negative for nosebleeds and sore throat.   Eyes: Negative for double vision.  Respiratory: Negative for cough, hemoptysis, sputum production and wheezing.   Cardiovascular: Negative for chest pain, palpitations, orthopnea and leg swelling.  Gastrointestinal: Negative for abdominal pain, blood in stool, constipation, diarrhea, heartburn, melena, nausea and vomiting.  Genitourinary: Negative for dysuria, frequency and urgency.  Musculoskeletal: Positive for joint pain. Negative for back pain.  Skin: Negative.  Negative for itching and rash.  Neurological: Negative for tingling, focal weakness, weakness and headaches.  Endo/Heme/Allergies: Does not bruise/bleed easily.  Psychiatric/Behavioral: Negative for depression. The patient is not nervous/anxious and does not have insomnia.    MEDICAL HISTORY:  Past Medical History:  Diagnosis Date  . Amenia 11/2018  . Blood transfusion without reported diagnosis   . Migraine   . PTSD (post-traumatic stress disorder)    reports daughter was murdered   . Tachycardia   . Thyroid disease   . Vitamin D deficiency     SURGICAL  HISTORY: Past Surgical History:  Procedure Laterality Date  . BIOPSY  10/17/2019   Procedure: BIOPSY;  Surgeon: Doran Stabler, MD;  Location: [facility];  Service: Gastroenterology;;  . COLONOSCOPY WITH PROPOFOL N/A 10/17/2019   Procedure: COLONOSCOPY WITH PROPOFOL;  Surgeon: Doran Stabler, MD;  Location: [facility];  Service: Gastroenterology;  Laterality: N/A;  . ESOPHAGOGASTRODUODENOSCOPY (EGD) WITH PROPOFOL N/A 10/17/2019   Procedure: ESOPHAGOGASTRODUODENOSCOPY (EGD) WITH PROPOFOL;  Surgeon: Doran Stabler, MD;  Location: [facility];  Service: Gastroenterology;  Laterality: N/A;  . TUBAL LIGATION      SOCIAL HISTORY: Social History   Socioeconomic History  . Marital status: Single    Spouse name: Not on file  . Number of children: Not on file  . Years of education: Not on file  . Highest education level: Not on file  Occupational History  . Not on file  Tobacco Use  . Smoking status: Never Smoker  . Smokeless tobacco: Never Used  Substance and Sexual Activity  . Alcohol use: No  . Drug use: No  . Sexual activity: Not Currently    Birth control/protection: Surgical  Other Topics Concern  . Not on file  Social History Narrative   Pt relocated to [place] from [place] around 2012.      Lives in [place]; with significant other. 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    Social Determinants of Health   Financial Resource Strain:   . Difficulty of Paying Living Expenses: Not on file  Food Insecurity:   . Worried About Charity fundraiser in the Last Year: Not on file  . Ran Out of Food in the Last Year: Not on file  Transportation Needs:   .  Lack of Transportation (Medical): Not on file  . Lack of Transportation (Non-Medical): Not on file  Physical Activity:   . Days of Exercise per Week: Not on file  . Minutes of Exercise per Session: Not on file  Stress:   . Feeling of Stress : Not on file  Social Connections:   . Frequency  of Communication with Friends and Family: Not on file  . Frequency of Social Gatherings with Friends and Family: Not on file  . Attends Religious Services: Not on file  . Active Member of Clubs or Organizations: Not on file  . Attends Archivist Meetings: Not on file  . Marital Status: Not on file  Intimate Partner Violence:   . Fear of Current or Ex-Partner: Not on file  . Emotionally Abused: Not on file  . Physically Abused: Not on file  . Sexually Abused: Not on file    FAMILY HISTORY: Family History  Problem Relation Age of Onset  . Hypertension Mother   . Arthritis Mother   . Hypertension Son   . Cancer Maternal Aunt   . Diabetes Maternal Grandmother     ALLERGIES:  is allergic to bee venom.  MEDICATIONS:  Current Outpatient Medications  Medication Sig Dispense Refill  . diphenhydrAMINE (BENADRYL) 25 mg capsule Take 25-50 mg by mouth as needed (for allergic reactions).    Marland Kitchen escitalopram (LEXAPRO) 20 MG tablet Take 1 tablet (20 mg total) by mouth daily. (Patient taking differently: Take 20 mg by mouth at bedtime. ) 30 tablet 3  . folic acid (FOLVITE) 1 MG tablet Take 1 tablet (1 mg total) by mouth daily. 30 tablet 3  . meclizine (ANTIVERT) 25 MG tablet Take 1 tablet (25 mg total) by mouth 2 (two) times daily as needed for dizziness. 30 tablet 1  . Multiple Vitamins-Minerals (CENTRUM MULTIGUMMIES) CHEW Chew 1-2 tablets by mouth daily.    . SUMAtriptan (IMITREX) 50 MG tablet Take 50 mg by mouth every 2 (two) hours as needed for migraine or headache (and may repeat in 2 hours if headache persists or recurs).    . famotidine (PEPCID) 20 MG tablet Take 2 tablets (40 mg total) by mouth 2 (two) times daily. 60 tablet 1  . ferrous sulfate 325 (65 FE) MG tablet Take 1 tablet (325 mg total) by mouth 3 (three) times daily with meals. 90 tablet 2   No current facility-administered medications for this visit.    PHYSICAL EXAMINATION:   Vitals:   01/20/20 1321  BP: 109/73   Pulse: 83  Temp: (!) 96.9 F (36.1 C)   Filed Weights   01/20/20 1321  Weight: 259 lb 8 oz (117.7 kg)    Physical Exam  Constitutional: She is oriented to person, place, and time and well-developed, well-nourished, and in no distress.  HENT:  Head: Normocephalic and atraumatic.  Mouth/Throat: Oropharynx is clear and moist. No oropharyngeal exudate.  Eyes: Pupils are equal, round, and reactive to light.  Cardiovascular: Normal rate and regular rhythm.  Pulmonary/Chest: No respiratory distress. She has no wheezes.  Abdominal: Soft. Bowel sounds are normal. She exhibits no distension and no mass. There is no abdominal tenderness. There is no rebound and no guarding.  Musculoskeletal:        General: No tenderness or edema. Normal range of motion.     Cervical back: Normal range of motion and neck supple.  Neurological: She is alert and oriented to person, place, and time.  Skin: Skin is warm.  Psychiatric:  Affect normal.    LABORATORY DATA:  I have reviewed the data as listed Lab Results  Component Value Date   WBC 8.1 01/20/2020   HGB 10.3 (L) 01/20/2020   HCT 36.2 01/20/2020   MCV 76.9 (L) 01/20/2020   PLT 596 (H) 01/20/2020   Recent Labs    07/19/19 1717 10/16/19 0250  NA 137 139  K 4.0 4.0  CL 105 108  CO2 23 23  GLUCOSE 104* 102*  BUN 9 14  CREATININE 0.86 0.91  CALCIUM 8.7* 8.6*  GFRNONAA >60 >60  GFRAA >60 >60  PROT  --  7.3  ALBUMIN  --  3.1*  AST  --  12*  ALT  --  9  ALKPHOS  --  70  BILITOT  --  <0.1*     No results found.  Iron deficiency anemia due to chronic blood loss # Severe iron deficient anemia-question etiology- on IV venofer- Hb  10.5;  proceed with venofer today.    # Iron deficiency secondary to possible blood loss [FOBT positive]; recommend/stressed re: capsule study.  Insurance declining CT of the abdomen pelvis.   # DISPOSITION:  # venofer today # 2 months- MD;cbc/iron studies/ferritin; possible venofer-Dr.B  All questions  were answered. The patient knows to call the clinic with any problems, questions or concerns.    Christina Sickle, MD 01/21/2020 8:45 AM

## 2020-01-20 NOTE — Assessment & Plan Note (Addendum)
#   Severe iron deficient anemia-question etiology- on IV venofer- Hb  10.5;  proceed with venofer today.    # Iron deficiency secondary to possible blood loss [FOBT positive]; recommend/stressed re: capsule study.  Insurance declining CT of the abdomen pelvis.   # DISPOSITION:  # venofer today # 2 months- MD;cbc/iron studies/ferritin; possible venofer-Dr.B

## 2020-01-20 NOTE — Progress Notes (Signed)
Pt tolerated infusion well. Pt and VS stable at discharge.  

## 2020-03-09 ENCOUNTER — Other Ambulatory Visit: Payer: Self-pay

## 2020-03-09 ENCOUNTER — Encounter (HOSPITAL_COMMUNITY): Payer: Self-pay | Admitting: Family Medicine

## 2020-03-09 ENCOUNTER — Ambulatory Visit (HOSPITAL_COMMUNITY)
Admission: EM | Admit: 2020-03-09 | Discharge: 2020-03-09 | Disposition: A | Discharge status: Home / Self Care | Payer: BC Managed Care – PPO | Attending: Family Medicine | Admitting: Family Medicine

## 2020-03-09 DIAGNOSIS — Z20822 Contact with and (suspected) exposure to covid-19: Secondary | ICD-10-CM

## 2020-03-09 DIAGNOSIS — R0981 Nasal congestion: Secondary | ICD-10-CM | POA: Diagnosis not present

## 2020-03-09 NOTE — ED Triage Notes (Signed)
Pt c/o sore throat, runny nose for several days. Denies cough, SOB, abdom pain, n/v/d, fever, chills, body aches.  States her husband tested positive for COVID today and had sore throat for several days prior.

## 2020-03-09 NOTE — ED Provider Notes (Signed)
[facility]    CSN: [id number] Arrival date & time: 03/09/20  1221      History   Chief Complaint Chief Complaint  Patient presents with  . Sore Throat    HPI Christina Mcbride is a 49 y.o. female.   Patient is a 49 year old female presents today for Covid testing.  Reporting close exposure at home.  She has had mild sore throat, runny nose and congestion for 7 days.  Symptoms have been constant.  She has not been taking medication for her symptoms.  Denies any associated cough, chest congestion, shortness of breath, abdominal pain, nausea, vomiting, diarrhea, fever, chills or body aches.  ROS per HPI      Past Medical History:  Diagnosis Date  . Amenia 11/2018  . Blood transfusion without reported diagnosis   . Migraine   . PTSD (post-traumatic stress disorder)    reports daughter was murdered   . Tachycardia   . Thyroid disease   . Vitamin D deficiency     Patient Active Problem List   Diagnosis Date Noted  . Severe anemia 10/16/2019  . Orthostatic hypotension 11/29/2018  . Chronic daily headache 06/08/2014  . Gastroesophageal reflux disease without esophagitis 06/08/2014  . Iron deficiency anemia due to chronic blood loss 03/06/2014  . Left shoulder pain 03/06/2014  . Anemia 12/27/2013  . Symptomatic anemia 12/27/2013  . Tachycardia 12/27/2013  . Bilateral leg edema 12/27/2013  . Goiter 12/27/2013  . Dizziness 12/27/2013  . Generalized weakness 12/27/2013  . Iron deficiency anemia 12/27/2013  . Low iron stores 12/27/2013  . UTI (urinary tract infection) 12/27/2013  . PTSD (post-traumatic stress disorder)   . Migraine     Past Surgical History:  Procedure Laterality Date  . BIOPSY  10/17/2019   Procedure: BIOPSY;  Surgeon: Doran Stabler, MD;  Location: [facility];  Service: Gastroenterology;;  . COLONOSCOPY WITH PROPOFOL N/A 10/17/2019   Procedure: COLONOSCOPY WITH PROPOFOL;  Surgeon: Doran Stabler, MD;  Location: [facility];   Service: Gastroenterology;  Laterality: N/A;  . ESOPHAGOGASTRODUODENOSCOPY (EGD) WITH PROPOFOL N/A 10/17/2019   Procedure: ESOPHAGOGASTRODUODENOSCOPY (EGD) WITH PROPOFOL;  Surgeon: Doran Stabler, MD;  Location: [facility];  Service: Gastroenterology;  Laterality: N/A;  . TUBAL LIGATION      OB History   No obstetric history on file.      Home Medications    Prior to Admission medications   Medication Sig Start Date End Date Taking? Authorizing Provider  diphenhydrAMINE (BENADRYL) 25 mg capsule Take 25-50 mg by mouth as needed (for allergic reactions).    [provider]  escitalopram (LEXAPRO) 20 MG tablet Take 1 tablet (20 mg total) by mouth daily. Patient taking differently: Take 20 mg by mouth at bedtime.  06/18/19   Kerin Perna, NP  famotidine (PEPCID) 20 MG tablet Take 2 tablets (40 mg total) by mouth 2 (two) times daily. 10/17/19 11/17/19  Shahmehdi, Valeria Batman, MD  ferrous sulfate 325 (65 FE) MG tablet Take 1 tablet (325 mg total) by mouth 3 (three) times daily with meals. 10/17/19 11/17/19  Shahmehdi, Valeria Batman, MD  folic acid (FOLVITE) 1 MG tablet Take 1 tablet (1 mg total) by mouth daily. 06/19/19   Kerin Perna, NP  meclizine (ANTIVERT) 25 MG tablet Take 1 tablet (25 mg total) by mouth 2 (two) times daily as needed for dizziness. 06/18/19   Kerin Perna, NP  Multiple Vitamins-Minerals (CENTRUM MULTIGUMMIES) CHEW Chew 1-2 tablets by mouth daily.  [provider]  SUMAtriptan (IMITREX) 50 MG tablet Take 50 mg by mouth every 2 (two) hours as needed for migraine or headache (and may repeat in 2 hours if headache persists or recurs).    [provider]    Family History Family History  Problem Relation Age of Onset  . Hypertension Mother   . Arthritis Mother   . Hypertension Son   . Cancer Maternal Aunt   . Diabetes Maternal Grandmother     Social History Social History   Tobacco Use  . Smoking status: Never Smoker  .  Smokeless tobacco: Never Used  Substance Use Topics  . Alcohol use: No  . Drug use: No     Allergies   Bee venom   Review of Systems Review of Systems   Physical Exam Triage Vital Signs ED Triage Vitals  Enc Vitals Group     BP 03/09/20 1330 128/89     Pulse Rate 03/09/20 1330 93     Resp 03/09/20 1330 18     Temp 03/09/20 1330 98.2 F (36.8 C)     Temp Source 03/09/20 1330 Oral     SpO2 03/09/20 1330 100 %     Weight --      Height --      Head Circumference --      Peak Flow --      Pain Score 03/09/20 1326 0     Pain Loc --      Pain Edu? --      Excl. in [place]? --    No data found.  Updated Vital Signs BP 128/89 (BP Location: Left Wrist)   Pulse 93   Temp 98.2 F (36.8 C) (Oral)   Resp 18   LMP 05/04/2016   SpO2 100%   Visual Acuity Right Eye Distance:   Left Eye Distance:   Bilateral Distance:    Right Eye Near:   Left Eye Near:    Bilateral Near:     Physical Exam Vitals and nursing note reviewed.  Constitutional:      General: She is not in acute distress.    Appearance: Normal appearance. She is not ill-appearing, toxic-appearing or diaphoretic.  HENT:     Head: Normocephalic.     Nose: Congestion present.  Eyes:     Conjunctiva/sclera: Conjunctivae normal.  Pulmonary:     Effort: Pulmonary effort is normal.  Musculoskeletal:        General: Normal range of motion.     Cervical back: Normal range of motion.  Skin:    General: Skin is warm and dry.     Findings: No rash.  Neurological:     Mental Status: She is alert.  Psychiatric:        Mood and Affect: Mood normal.      UC Treatments / Results  Labs (all labs ordered are listed, but only abnormal results are displayed) Labs Reviewed  SARS CORONAVIRUS 2 (TAT 6-24 HRS)    EKG   Radiology No results found.  Procedures Procedures (including critical care time)  Medications Ordered in UC Medications - No data to display  Initial Impression / Assessment and Plan / UC  Course  I have reviewed the triage vital signs and the nursing notes.  Pertinent labs & imaging results that were available during my care of the patient were reviewed by me and considered in my medical decision making (see chart for details).     Close exposure to Covid-Covid swab  sent for testing with labs pending. Recommend over-the-counter medications as needed for symptoms. Final Clinical Impressions(s) / UC Diagnoses   Final diagnoses:  Close exposure to COVID-19 virus     Discharge Instructions     We have tested you for COVID  Go home and quarantine until we get our results.  You can take OTC medications as needed.      ED Prescriptions    None     PDMP not reviewed this encounter.   Loura Halt A, NP 03/09/20 1550

## 2020-03-09 NOTE — Discharge Instructions (Signed)
We have tested you for COVID  Go home and quarantine until we get our results.  You can take OTC medications as needed.

## 2020-03-10 LAB — SARS CORONAVIRUS 2 (TAT 6-24 HRS): SARS Coronavirus 2: NEGATIVE

## 2020-03-16 ENCOUNTER — Inpatient Hospital Stay: Payer: BC Managed Care – PPO

## 2020-03-16 ENCOUNTER — Other Ambulatory Visit: Payer: Self-pay

## 2020-03-16 ENCOUNTER — Other Ambulatory Visit: Payer: Self-pay | Admitting: *Deleted

## 2020-03-16 ENCOUNTER — Inpatient Hospital Stay (HOSPITAL_BASED_OUTPATIENT_CLINIC_OR_DEPARTMENT_OTHER): Payer: BC Managed Care – PPO | Admitting: Internal Medicine

## 2020-03-16 ENCOUNTER — Inpatient Hospital Stay: Payer: BC Managed Care – PPO | Attending: Internal Medicine

## 2020-03-16 ENCOUNTER — Encounter: Payer: Self-pay | Admitting: Internal Medicine

## 2020-03-16 VITALS — BP 118/88 | HR 94 | Temp 98.9°F | Resp 17

## 2020-03-16 DIAGNOSIS — D500 Iron deficiency anemia secondary to blood loss (chronic): Secondary | ICD-10-CM | POA: Diagnosis not present

## 2020-03-16 DIAGNOSIS — D509 Iron deficiency anemia, unspecified: Secondary | ICD-10-CM | POA: Diagnosis present

## 2020-03-16 LAB — FERRITIN: Ferritin: 18 ng/mL (ref 11–307)

## 2020-03-16 LAB — CBC WITH DIFFERENTIAL/PLATELET
Abs Immature Granulocytes: 0.04 10*3/uL (ref 0.00–0.07)
Basophils Absolute: 0 10*3/uL (ref 0.0–0.1)
Basophils Relative: 0 %
Eosinophils Absolute: 0 10*3/uL (ref 0.0–0.5)
Eosinophils Relative: 1 %
HCT: 30.1 % — ABNORMAL LOW (ref 36.0–46.0)
Hemoglobin: 8.7 g/dL — ABNORMAL LOW (ref 12.0–15.0)
Immature Granulocytes: 1 %
Lymphocytes Relative: 21 %
Lymphs Abs: 0.9 10*3/uL (ref 0.7–4.0)
MCH: 20.4 pg — ABNORMAL LOW (ref 26.0–34.0)
MCHC: 28.9 g/dL — ABNORMAL LOW (ref 30.0–36.0)
MCV: 70.5 fL — ABNORMAL LOW (ref 80.0–100.0)
Monocytes Absolute: 0.3 10*3/uL (ref 0.1–1.0)
Monocytes Relative: 7 %
Neutro Abs: 3 10*3/uL (ref 1.7–7.7)
Neutrophils Relative %: 70 %
Platelets: 529 10*3/uL — ABNORMAL HIGH (ref 150–400)
RBC: 4.27 MIL/uL (ref 3.87–5.11)
RDW: 18.6 % — ABNORMAL HIGH (ref 11.5–15.5)
WBC: 4.3 10*3/uL (ref 4.0–10.5)
nRBC: 0.7 % — ABNORMAL HIGH (ref 0.0–0.2)

## 2020-03-16 LAB — IRON AND TIBC
Iron: 23 ug/dL — ABNORMAL LOW (ref 28–170)
Saturation Ratios: 7 % — ABNORMAL LOW (ref 10.4–31.8)
TIBC: 337 ug/dL (ref 250–450)
UIBC: 314 ug/dL

## 2020-03-16 MED ORDER — SODIUM CHLORIDE 0.9 % IV SOLN
Freq: Once | INTRAVENOUS | Status: AC
  Filled 2020-03-16: qty 250

## 2020-03-16 MED ORDER — IRON SUCROSE 20 MG/ML IV SOLN
200.0000 mg | Freq: Once | INTRAVENOUS | Status: AC
  Administered 2020-03-16: 15:00:00 200 mg via INTRAVENOUS
  Filled 2020-03-16: qty 10

## 2020-03-16 NOTE — Assessment & Plan Note (Signed)
#   Severe iron deficient anemia-question etiology- on IV venofer- Hb  8.3 proceed with venofer today.    # Iron deficiency secondary to possible blood loss [FOBT positive]; recommend/stressed re: capsule study.  Recommend calling GI, Dr.Danis again.   # DISPOSITION:  #  venofer today # venofer in 2 weeks # 2 months- MD;cbc/possible venofer-Dr.B

## 2020-03-16 NOTE — Progress Notes (Signed)
[facility] NOTE  Patient Care Team: Kerin Perna, NP as PCP - General (Internal Medicine)  CHIEF COMPLAINTS/PURPOSE OF CONSULTATION: severe anemia  HEMATOLOGY HISTORY  # IRON DEFICIENCY ANEMIA [since 2015; NOV 2020 nadir Hb- 3.6-s/p multiple PRBC transfusions] NOV 2020-EGD/colonoscopy- unremarkable [Dr.Danis; [facility]]; no menorrhagia. PNH-Normal; NO Hemolysis.   # Migraines/ PTSD  HISTORY OF PRESENTING ILLNESS:  [name] 49 y.o.  female severe iron deficiency of unclear etiology is here for follow-up.  Patient has mild to moderate fatigue.  Otherwise denies any blood in stools or black or stools.  Patient has not had a capsule study-because of her husband scheduling issues.  Complains of stuffy nose; sneezing attributed to pollen.  Review of Systems  Constitutional: Positive for malaise/fatigue. Negative for chills, diaphoresis, fever and weight loss.  HENT: Negative for nosebleeds and sore throat.   Eyes: Negative for double vision.  Respiratory: Negative for cough, hemoptysis, sputum production and wheezing.   Cardiovascular: Negative for chest pain, palpitations, orthopnea and leg swelling.  Gastrointestinal: Negative for abdominal pain, blood in stool, constipation, diarrhea, heartburn, melena, nausea and vomiting.  Genitourinary: Negative for dysuria, frequency and urgency.  Musculoskeletal: Positive for joint pain. Negative for back pain.  Skin: Negative.  Negative for itching and rash.  Neurological: Negative for tingling, focal weakness, weakness and headaches.  Endo/Heme/Allergies: Does not bruise/bleed easily.  Psychiatric/Behavioral: Negative for depression. The patient is not nervous/anxious and does not have insomnia.    MEDICAL HISTORY:  Past Medical History:  Diagnosis Date  . Amenia 11/2018  . Blood transfusion without reported diagnosis   . Migraine   . PTSD (post-traumatic stress disorder)    reports daughter was murdered    . Tachycardia   . Thyroid disease   . Vitamin D deficiency     SURGICAL HISTORY: Past Surgical History:  Procedure Laterality Date  . BIOPSY  10/17/2019   Procedure: BIOPSY;  Surgeon: Doran Stabler, MD;  Location: [facility];  Service: Gastroenterology;;  . COLONOSCOPY WITH PROPOFOL N/A 10/17/2019   Procedure: COLONOSCOPY WITH PROPOFOL;  Surgeon: Doran Stabler, MD;  Location: [facility];  Service: Gastroenterology;  Laterality: N/A;  . ESOPHAGOGASTRODUODENOSCOPY (EGD) WITH PROPOFOL N/A 10/17/2019   Procedure: ESOPHAGOGASTRODUODENOSCOPY (EGD) WITH PROPOFOL;  Surgeon: Doran Stabler, MD;  Location: [facility];  Service: Gastroenterology;  Laterality: N/A;  . TUBAL LIGATION      SOCIAL HISTORY: Social History   Socioeconomic History  . Marital status: Single    Spouse name: Not on file  . Number of children: Not on file  . Years of education: Not on file  . Highest education level: Not on file  Occupational History  . Not on file  Tobacco Use  . Smoking status: Never Smoker  . Smokeless tobacco: Never Used  Substance and Sexual Activity  . Alcohol use: No  . Drug use: No  . Sexual activity: Not Currently    Birth control/protection: Surgical  Other Topics Concern  . Not on file  Social History Narrative   Pt relocated to [place] from [place] around 2012.      Lives in [place]; with significant other. 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    Social Determinants of Health   Financial Resource Strain:   . Difficulty of Paying Living Expenses:   Food Insecurity:   . Worried About Charity fundraiser in the Last Year:   . [place] in the Last Year:  Transportation Needs:   . Film/video editor (Medical):   Marland Kitchen Lack of Transportation (Non-Medical):   Physical Activity:   . Days of Exercise per Week:   . Minutes of Exercise per Session:   Stress:   . Feeling of Stress :   Social Connections:   . Frequency of  Communication with Friends and Family:   . Frequency of Social Gatherings with Friends and Family:   . Attends Religious Services:   . Active Member of Clubs or Organizations:   . Attends Archivist Meetings:   Marland Kitchen Marital Status:   Intimate Partner Violence:   . Fear of Current or Ex-Partner:   . Emotionally Abused:   Marland Kitchen Physically Abused:   . Sexually Abused:     FAMILY HISTORY: Family History  Problem Relation Age of Onset  . Hypertension Mother   . Arthritis Mother   . Hypertension Son   . Cancer Maternal Aunt   . Diabetes Maternal Grandmother     ALLERGIES:  is allergic to bee venom.  MEDICATIONS:  Current Outpatient Medications  Medication Sig Dispense Refill  . diphenhydrAMINE (BENADRYL) 25 mg capsule Take 25-50 mg by mouth as needed (for allergic reactions).    Marland Kitchen escitalopram (LEXAPRO) 20 MG tablet Take 1 tablet (20 mg total) by mouth daily. (Patient taking differently: Take 20 mg by mouth at bedtime. ) 30 tablet 3  . famotidine (PEPCID) 20 MG tablet Take 2 tablets (40 mg total) by mouth 2 (two) times daily. 60 tablet 1  . ferrous sulfate 325 (65 FE) MG tablet Take 1 tablet (325 mg total) by mouth 3 (three) times daily with meals. 90 tablet 2  . folic acid (FOLVITE) 1 MG tablet Take 1 tablet (1 mg total) by mouth daily. 30 tablet 3  . meclizine (ANTIVERT) 25 MG tablet Take 1 tablet (25 mg total) by mouth 2 (two) times daily as needed for dizziness. 30 tablet 1  . Multiple Vitamins-Minerals (CENTRUM MULTIGUMMIES) CHEW Chew 1-2 tablets by mouth daily.    . SUMAtriptan (IMITREX) 50 MG tablet Take 50 mg by mouth every 2 (two) hours as needed for migraine or headache (and may repeat in 2 hours if headache persists or recurs).     No current facility-administered medications for this visit.    PHYSICAL EXAMINATION:   Vitals:   03/16/20 1349  BP: 111/76  Pulse: (!) 101  Resp: 16  Temp: (!) 96.6 F (35.9 C)  SpO2: 100%   Filed Weights   03/16/20 1349   Weight: 261 lb (118.4 kg)    Physical Exam  Constitutional: She is oriented to person, place, and time and well-developed, well-nourished, and in no distress.  HENT:  Head: Normocephalic and atraumatic.  Mouth/Throat: Oropharynx is clear and moist. No oropharyngeal exudate.  Eyes: Pupils are equal, round, and reactive to light.  Cardiovascular: Normal rate and regular rhythm.  Pulmonary/Chest: Effort normal and breath sounds normal. No respiratory distress. She has no wheezes.  Abdominal: Soft. Bowel sounds are normal. She exhibits no distension and no mass. There is no abdominal tenderness. There is no rebound and no guarding.  Musculoskeletal:        General: No tenderness or edema. Normal range of motion.     Cervical back: Normal range of motion and neck supple.  Neurological: She is alert and oriented to person, place, and time.  Skin: Skin is warm.  Psychiatric: Affect normal.    LABORATORY DATA:  I have reviewed the data as  listed Lab Results  Component Value Date   WBC 4.3 03/16/2020   HGB 8.7 (L) 03/16/2020   HCT 30.1 (L) 03/16/2020   MCV 70.5 (L) 03/16/2020   PLT 529 (H) 03/16/2020   Recent Labs    07/19/19 1717 10/16/19 0250  NA 137 139  K 4.0 4.0  CL 105 108  CO2 23 23  GLUCOSE 104* 102*  BUN 9 14  CREATININE 0.86 0.91  CALCIUM 8.7* 8.6*  GFRNONAA >60 >60  GFRAA >60 >60  PROT  --  7.3  ALBUMIN  --  3.1*  AST  --  12*  ALT  --  9  ALKPHOS  --  70  BILITOT  --  <0.1*     No results found.  Iron deficiency anemia due to chronic blood loss # Severe iron deficient anemia-question etiology- on IV venofer- Hb  8.3 proceed with venofer today.    # Iron deficiency secondary to possible blood loss [FOBT positive]; recommend/stressed re: capsule study.  Recommend calling GI, Dr.Danis again.   # DISPOSITION:  #  venofer today # venofer in 2 weeks # 2 months- MD;cbc/possible venofer-Dr.B  All questions were answered. The patient knows to call the  clinic with any problems, questions or concerns.    Cammie Sickle, MD 03/16/2020 2:27 PM

## 2020-03-16 NOTE — Progress Notes (Signed)
Pt in for follow up, states having trouble with seasonal allergies.  Denies any other concerns.

## 2020-03-16 NOTE — Progress Notes (Signed)
Pt tolerated infusion well. Pt and VS stable at discharge.  

## 2020-03-30 ENCOUNTER — Other Ambulatory Visit: Payer: Self-pay

## 2020-03-30 ENCOUNTER — Other Ambulatory Visit: Payer: Self-pay | Admitting: *Deleted

## 2020-03-30 ENCOUNTER — Inpatient Hospital Stay: Payer: BC Managed Care – PPO | Attending: Internal Medicine

## 2020-03-30 ENCOUNTER — Inpatient Hospital Stay: Payer: BC Managed Care – PPO

## 2020-03-30 VITALS — BP 122/81 | HR 93 | Temp 97.9°F | Resp 19

## 2020-03-30 DIAGNOSIS — E079 Disorder of thyroid, unspecified: Secondary | ICD-10-CM | POA: Diagnosis not present

## 2020-03-30 DIAGNOSIS — G43101 Migraine with aura, not intractable, with status migrainosus: Secondary | ICD-10-CM | POA: Diagnosis not present

## 2020-03-30 DIAGNOSIS — D500 Iron deficiency anemia secondary to blood loss (chronic): Secondary | ICD-10-CM

## 2020-03-30 DIAGNOSIS — D649 Anemia, unspecified: Secondary | ICD-10-CM

## 2020-03-30 DIAGNOSIS — J9811 Atelectasis: Secondary | ICD-10-CM | POA: Diagnosis not present

## 2020-03-30 DIAGNOSIS — Z79899 Other long term (current) drug therapy: Secondary | ICD-10-CM | POA: Insufficient documentation

## 2020-03-30 LAB — CBC WITH DIFFERENTIAL/PLATELET
Abs Immature Granulocytes: 0.15 10*3/uL — ABNORMAL HIGH (ref 0.00–0.07)
Basophils Absolute: 0 10*3/uL (ref 0.0–0.1)
Basophils Relative: 1 %
Eosinophils Absolute: 0.1 10*3/uL (ref 0.0–0.5)
Eosinophils Relative: 2 %
HCT: 24.2 % — ABNORMAL LOW (ref 36.0–46.0)
Hemoglobin: 6.8 g/dL — ABNORMAL LOW (ref 12.0–15.0)
Immature Granulocytes: 2 %
Lymphocytes Relative: 23 %
Lymphs Abs: 2 10*3/uL (ref 0.7–4.0)
MCH: 20.7 pg — ABNORMAL LOW (ref 26.0–34.0)
MCHC: 28.1 g/dL — ABNORMAL LOW (ref 30.0–36.0)
MCV: 73.8 fL — ABNORMAL LOW (ref 80.0–100.0)
Monocytes Absolute: 0.7 10*3/uL (ref 0.1–1.0)
Monocytes Relative: 8 %
Neutro Abs: 5.7 10*3/uL (ref 1.7–7.7)
Neutrophils Relative %: 64 %
Platelets: 1056 10*3/uL (ref 150–400)
RBC: 3.28 MIL/uL — ABNORMAL LOW (ref 3.87–5.11)
RDW: 21.8 % — ABNORMAL HIGH (ref 11.5–15.5)
WBC: 8.6 10*3/uL (ref 4.0–10.5)
nRBC: 1.7 % — ABNORMAL HIGH (ref 0.0–0.2)

## 2020-03-30 LAB — SAMPLE TO BLOOD BANK

## 2020-03-30 MED ORDER — IRON SUCROSE 20 MG/ML IV SOLN
200.0000 mg | Freq: Once | INTRAVENOUS | Status: AC
  Administered 2020-03-30: 200 mg via INTRAVENOUS
  Filled 2020-03-30: qty 10

## 2020-03-30 MED ORDER — SODIUM CHLORIDE 0.9 % IV SOLN
Freq: Once | INTRAVENOUS | Status: AC
  Filled 2020-03-30: qty 250

## 2020-03-30 NOTE — Progress Notes (Signed)
Pt states that her "dizzness is coming back with a vengence" she sates that she could not get out of bed last week due to the dizziness. Pt states that her headaches are returning. Pt states that typically these symptoms improve while on iron and that since they are this bad, she feels that her levels may be dropping more. Dr. Rogue Bussing aware. Per Dr. Rogue Bussing proceed with Venofer and collect a STAT CBC and hold tube.

## 2020-03-30 NOTE — Progress Notes (Signed)
Post Venofer infusion, pt states that she "still feels dizzy and my headache is coming back and is worst than ever" VSS. Labs reviewed with MD and treatment team. Nira Conn, RN at chairside to inform pt about labs and the need for a blood transfusion on 03/31/20. Per MD pt may be discharged at this time. RN educated pt on the importance of notifying the clinic if any complications occur and if it is an emergency to call 911. Pt is aware of blood transfusion on 03/31/20 and agrees with plan. Pt verbalized understanding. Pt stable for discharge.   Christina Mcbride [employer]

## 2020-03-31 ENCOUNTER — Observation Stay: Payer: BC Managed Care – PPO

## 2020-03-31 ENCOUNTER — Inpatient Hospital Stay: Payer: BC Managed Care – PPO

## 2020-03-31 ENCOUNTER — Telehealth: Payer: Self-pay | Admitting: Internal Medicine

## 2020-03-31 ENCOUNTER — Inpatient Hospital Stay (HOSPITAL_BASED_OUTPATIENT_CLINIC_OR_DEPARTMENT_OTHER): Payer: BC Managed Care – PPO | Admitting: Oncology

## 2020-03-31 ENCOUNTER — Emergency Department: Payer: BC Managed Care – PPO

## 2020-03-31 ENCOUNTER — Encounter: Payer: Self-pay | Admitting: Emergency Medicine

## 2020-03-31 ENCOUNTER — Observation Stay
Admission: EM | Admit: 2020-03-31 | Discharge: 2020-04-01 | Disposition: A | Discharge status: Home / Self Care | Payer: BC Managed Care – PPO | Attending: Internal Medicine | Admitting: Internal Medicine

## 2020-03-31 ENCOUNTER — Other Ambulatory Visit: Payer: Self-pay

## 2020-03-31 VITALS — BP 132/77 | HR 88 | Temp 97.0°F | Resp 20

## 2020-03-31 DIAGNOSIS — D649 Anemia, unspecified: Secondary | ICD-10-CM | POA: Diagnosis not present

## 2020-03-31 DIAGNOSIS — K449 Diaphragmatic hernia without obstruction or gangrene: Secondary | ICD-10-CM | POA: Diagnosis not present

## 2020-03-31 DIAGNOSIS — Z7901 Long term (current) use of anticoagulants: Secondary | ICD-10-CM | POA: Diagnosis not present

## 2020-03-31 DIAGNOSIS — G43101 Migraine with aura, not intractable, with status migrainosus: Secondary | ICD-10-CM | POA: Diagnosis not present

## 2020-03-31 DIAGNOSIS — G43909 Migraine, unspecified, not intractable, without status migrainosus: Secondary | ICD-10-CM | POA: Insufficient documentation

## 2020-03-31 DIAGNOSIS — Z79899 Other long term (current) drug therapy: Secondary | ICD-10-CM | POA: Insufficient documentation

## 2020-03-31 DIAGNOSIS — R531 Weakness: Secondary | ICD-10-CM | POA: Diagnosis present

## 2020-03-31 DIAGNOSIS — D473 Essential (hemorrhagic) thrombocythemia: Secondary | ICD-10-CM | POA: Insufficient documentation

## 2020-03-31 DIAGNOSIS — F329 Major depressive disorder, single episode, unspecified: Secondary | ICD-10-CM | POA: Insufficient documentation

## 2020-03-31 DIAGNOSIS — J9811 Atelectasis: Secondary | ICD-10-CM | POA: Diagnosis not present

## 2020-03-31 DIAGNOSIS — D500 Iron deficiency anemia secondary to blood loss (chronic): Secondary | ICD-10-CM

## 2020-03-31 DIAGNOSIS — K922 Gastrointestinal hemorrhage, unspecified: Secondary | ICD-10-CM | POA: Insufficient documentation

## 2020-03-31 DIAGNOSIS — F4310 Post-traumatic stress disorder, unspecified: Secondary | ICD-10-CM | POA: Insufficient documentation

## 2020-03-31 DIAGNOSIS — U071 COVID-19: Secondary | ICD-10-CM | POA: Diagnosis not present

## 2020-03-31 DIAGNOSIS — D509 Iron deficiency anemia, unspecified: Principal | ICD-10-CM | POA: Insufficient documentation

## 2020-03-31 DIAGNOSIS — X58XXXA Exposure to other specified factors, initial encounter: Secondary | ICD-10-CM | POA: Diagnosis not present

## 2020-03-31 LAB — PREPARE RBC (CROSSMATCH)

## 2020-03-31 LAB — BASIC METABOLIC PANEL
Anion gap: 9 (ref 5–15)
BUN: 16 mg/dL (ref 6–20)
CO2: 23 mmol/L (ref 22–32)
Calcium: 8.9 mg/dL (ref 8.9–10.3)
Chloride: 103 mmol/L (ref 98–111)
Creatinine, Ser: 0.77 mg/dL (ref 0.44–1.00)
GFR calc Af Amer: >60 mL/min (ref >60)
GFR calc non Af Amer: >60 mL/min (ref >60)
Glucose, Bld: 84 mg/dL (ref 70–99)
Potassium: 4.7 mmol/L (ref 3.5–5.1)
Sodium: 135 mmol/L (ref 135–145)

## 2020-03-31 LAB — RETICULOCYTES
Immature Retic Fract: 42.3 % — ABNORMAL HIGH (ref 2.3–15.9)
RBC.: 3.25 MIL/uL — ABNORMAL LOW (ref 3.87–5.11)
Retic Count, Absolute: 178 10*3/uL (ref 19.0–186.0)
Retic Ct Pct: 5.6 % — ABNORMAL HIGH (ref 0.4–3.1)

## 2020-03-31 LAB — CBC WITH DIFFERENTIAL/PLATELET
Abs Immature Granulocytes: 0.26 10*3/uL — ABNORMAL HIGH (ref 0.00–0.07)
Basophils Absolute: 0 10*3/uL (ref 0.0–0.1)
Basophils Relative: 0 %
Eosinophils Absolute: 0.1 10*3/uL (ref 0.0–0.5)
Eosinophils Relative: 1 %
HCT: 22.6 % — ABNORMAL LOW (ref 36.0–46.0)
Hemoglobin: 6.3 g/dL — ABNORMAL LOW (ref 12.0–15.0)
Immature Granulocytes: 3 %
Lymphocytes Relative: 19 %
Lymphs Abs: 2 10*3/uL (ref 0.7–4.0)
MCH: 20.2 pg — ABNORMAL LOW (ref 26.0–34.0)
MCHC: 27.9 g/dL — ABNORMAL LOW (ref 30.0–36.0)
MCV: 72.4 fL — ABNORMAL LOW (ref 80.0–100.0)
Monocytes Absolute: 0.7 10*3/uL (ref 0.1–1.0)
Monocytes Relative: 7 %
Neutro Abs: 7.4 10*3/uL (ref 1.7–7.7)
Neutrophils Relative %: 70 %
Platelets: 876 10*3/uL — ABNORMAL HIGH (ref 150–400)
RBC: 3.12 MIL/uL — ABNORMAL LOW (ref 3.87–5.11)
RDW: 21.2 % — ABNORMAL HIGH (ref 11.5–15.5)
WBC: 10.5 10*3/uL (ref 4.0–10.5)
nRBC: 2.7 % — ABNORMAL HIGH (ref 0.0–0.2)

## 2020-03-31 LAB — IRON AND TIBC
Iron: 52 ug/dL (ref 28–170)
Saturation Ratios: 16 % (ref 10.4–31.8)
TIBC: 318 ug/dL (ref 250–450)
UIBC: 266 ug/dL

## 2020-03-31 LAB — RESPIRATORY PANEL BY RT PCR (FLU A&B, COVID)
Influenza A by PCR: NEGATIVE
Influenza B by PCR: NEGATIVE
SARS Coronavirus 2 by RT PCR: POSITIVE — AB

## 2020-03-31 LAB — VITAMIN B12: Vitamin B-12: 654 pg/mL (ref 180–914)

## 2020-03-31 LAB — FOLATE: Folate: 33 ng/mL (ref >5.9)

## 2020-03-31 LAB — FERRITIN: Ferritin: 103 ng/mL (ref 11–307)

## 2020-03-31 MED ORDER — PROCHLORPERAZINE EDISYLATE 10 MG/2ML IJ SOLN
10.0000 mg | Freq: Once | INTRAMUSCULAR | Status: AC
  Administered 2020-03-31: 10 mg via INTRAVENOUS
  Filled 2020-03-31: qty 2

## 2020-03-31 MED ORDER — GUAIFENESIN-DM 100-10 MG/5ML PO SYRP: 10.0000 mL | ORAL_SOLUTION | ORAL | Status: DC | PRN

## 2020-03-31 MED ORDER — ONDANSETRON HCL 4 MG PO TABS: 4.0000 mg | ORAL_TABLET | Freq: Four times a day (QID) | ORAL | Status: DC | PRN

## 2020-03-31 MED ORDER — ALBUTEROL SULFATE (2.5 MG/3ML) 0.083% IN NEBU: 2.5000 mg | INHALATION_SOLUTION | RESPIRATORY_TRACT | Status: DC | PRN

## 2020-03-31 MED ORDER — ACETAMINOPHEN 325 MG PO TABS
650.0000 mg | ORAL_TABLET | Freq: Four times a day (QID) | ORAL | Status: DC | PRN
  Administered 2020-04-01 (×2): 650 mg via ORAL
  Filled 2020-03-31 (×2): qty 2

## 2020-03-31 MED ORDER — PANTOPRAZOLE SODIUM 40 MG PO TBEC
40.0000 mg | DELAYED_RELEASE_TABLET | Freq: Two times a day (BID) | ORAL | Status: DC
  Administered 2020-03-31 – 2020-04-01 (×2): 40 mg via ORAL
  Filled 2020-03-31 (×2): qty 1

## 2020-03-31 MED ORDER — ESCITALOPRAM OXALATE 10 MG PO TABS
20.0000 mg | ORAL_TABLET | Freq: Every day | ORAL | Status: DC
  Administered 2020-03-31: 20 mg via ORAL
  Filled 2020-03-31: qty 2

## 2020-03-31 MED ORDER — PANTOPRAZOLE SODIUM 40 MG IV SOLR
40.0000 mg | Freq: Once | INTRAVENOUS | Status: AC
  Administered 2020-03-31: 40 mg via INTRAVENOUS
  Filled 2020-03-31: qty 40

## 2020-03-31 MED ORDER — ACETAMINOPHEN 650 MG RE SUPP: 650.0000 mg | Freq: Four times a day (QID) | RECTAL | Status: DC | PRN

## 2020-03-31 MED ORDER — ACETAMINOPHEN 500 MG PO TABS
1000.0000 mg | ORAL_TABLET | Freq: Once | ORAL | Status: AC
  Administered 2020-03-31: 10:00:00 1000 mg via ORAL
  Filled 2020-03-31: qty 2

## 2020-03-31 MED ORDER — SUMATRIPTAN SUCCINATE 50 MG PO TABS
50.0000 mg | ORAL_TABLET | ORAL | Status: DC | PRN
  Administered 2020-04-01: 50 mg via ORAL
  Filled 2020-03-31 (×4): qty 1

## 2020-03-31 MED ORDER — HYDROCOD POLST-CPM POLST ER 10-8 MG/5ML PO SUER: 5.0000 mL | Freq: Two times a day (BID) | ORAL | Status: DC | PRN

## 2020-03-31 MED ORDER — ASCORBIC ACID 500 MG PO TABS
500.0000 mg | ORAL_TABLET | Freq: Every day | ORAL | Status: DC
  Administered 2020-03-31 – 2020-04-01 (×2): 500 mg via ORAL
  Filled 2020-03-31 (×2): qty 1

## 2020-03-31 MED ORDER — ONDANSETRON HCL 4 MG/2ML IJ SOLN: 4.0000 mg | Freq: Four times a day (QID) | INTRAMUSCULAR | Status: DC | PRN

## 2020-03-31 MED ORDER — MECLIZINE HCL 25 MG PO TABS: 25.0000 mg | ORAL_TABLET | Freq: Two times a day (BID) | ORAL | Status: DC | PRN

## 2020-03-31 MED ORDER — ALBUTEROL SULFATE HFA 108 (90 BASE) MCG/ACT IN AERS
2.0000 | INHALATION_SPRAY | Freq: Four times a day (QID) | RESPIRATORY_TRACT | Status: DC
  Administered 2020-03-31 – 2020-04-01 (×3): 2 via RESPIRATORY_TRACT
  Filled 2020-03-31: qty 6.7

## 2020-03-31 MED ORDER — SODIUM CHLORIDE 0.9 % IV SOLN
10.0000 mL/h | Freq: Once | INTRAVENOUS | Status: AC
  Administered 2020-03-31: 10 mL/h via INTRAVENOUS

## 2020-03-31 MED ORDER — ZINC SULFATE 220 (50 ZN) MG PO CAPS
220.0000 mg | ORAL_CAPSULE | Freq: Every day | ORAL | Status: DC
  Administered 2020-03-31 – 2020-04-01 (×2): 220 mg via ORAL
  Filled 2020-03-31 (×2): qty 1

## 2020-03-31 MED ORDER — DIPHENHYDRAMINE HCL 50 MG/ML IJ SOLN
12.5000 mg | Freq: Once | INTRAMUSCULAR | Status: AC
  Administered 2020-03-31: 12.5 mg via INTRAVENOUS
  Filled 2020-03-31: qty 1

## 2020-03-31 MED ORDER — DIPHENHYDRAMINE HCL 25 MG PO CAPS: 25.0000 mg | ORAL_CAPSULE | ORAL | Status: DC | PRN

## 2020-03-31 MED ORDER — LIDOCAINE HCL (PF) 1 % IJ SOLN
5.0000 mL | Freq: Once | INTRAMUSCULAR | Status: AC
  Administered 2020-03-31: 5 mL via INTRADERMAL
  Filled 2020-03-31: qty 5

## 2020-03-31 MED ORDER — FOLIC ACID 1 MG PO TABS
1.0000 mg | ORAL_TABLET | Freq: Every day | ORAL | Status: DC
  Administered 2020-03-31 – 2020-04-01 (×2): 1 mg via ORAL
  Filled 2020-03-31 (×2): qty 1

## 2020-03-31 MED ORDER — HYDRALAZINE HCL 10 MG PO TABS
10.0000 mg | ORAL_TABLET | Freq: Four times a day (QID) | ORAL | Status: DC | PRN
  Filled 2020-03-31: qty 1

## 2020-03-31 MED ORDER — IOHEXOL 350 MG/ML SOLN
100.0000 mL | Freq: Once | INTRAVENOUS | Status: AC | PRN
  Administered 2020-03-31: 100 mL via INTRAVENOUS

## 2020-03-31 NOTE — ED Notes (Signed)
Blood Bank called RN and informed RN that both units of blood are ready for pick up and transfusion.

## 2020-03-31 NOTE — Telephone Encounter (Signed)
Spoke to Dr. Quentin Cornwall ER doctor-given the recent drop in hemoglobin recommend CT scan abdomen pelvis; recommend terms of PRBC transfusion.  If no active bleeding-could be followed outpatient.  Patient needs to follow-up with GI for capsule study.   appt 6/22; will follow p sooner.

## 2020-03-31 NOTE — ED Triage Notes (Signed)
First nurse note- was at [facility] center for blood transfusion and unable to get IV so sent to IV.  Also c/o migraine. NAD

## 2020-03-31 NOTE — ED Notes (Signed)
Pt states headache persists since arrival but is tolerable. Lights off for comfort.

## 2020-03-31 NOTE — Significant Event (Signed)
Patient was found to be covid positive on PCR.  Incidental finding.  Ordered CXR.  Updated admission orders, will send to COVID isolation unit.  No oxygen requirment or fever noted.  Hold steroids.  If infiltrate noted on CXR, can start remdesivir.  At this time no COVID specific treatments to be initated.    Ralene Muskrat MD

## 2020-03-31 NOTE — Progress Notes (Signed)
Symptom Management Consult note [facility]  [phone] [phone]  Patient Care Team: Kerin Perna, NP as PCP - General (Internal Medicine)   Name of the patient: Christina Mcbride  [id number]  [birth year]   Date of visit: 03/31/2020   Diagnosis- IDA  Chief complaint/ Reason for visit- Headache/dizziness  Heme/Onc history:  Oncology History   No history exists.   Interval history-Christina Mcbride is a 49 year old female with past medical history significant for migraines, PTSD and severe anemia requiring several transfusions in the past who presents to [facility] with complaints of headache  present since 4 AM this morning that is not improving with interventions.  She was evaluated in infusion while waiting on PRBC. She has a history of migraines and has taken her sumatriptan as prescribed.  She denies any fevers or illness.  She denies any bleeding.  Her appetite is stable and she denies weight loss.  She denies chest pain.  Admits to exertional shortness of breath.  Denies any nausea, vomiting, constipation or diarrhea.  Denies any abdominal pain.  She completed a colonoscopy and upper endoscopy back in November 2020 which were unremarkable.  Awaiting a capsule study with Dr. [name] in the near future.  ECOG FS:1 - Symptomatic but completely ambulatory  Review of systems- Review of Systems  Constitutional: Positive for malaise/fatigue. Negative for chills, fever and weight loss.  HENT: Negative for congestion, ear pain and tinnitus.   Eyes: Negative.  Negative for blurred vision and double vision.  Respiratory: Negative.  Negative for cough, sputum production and shortness of breath.   Cardiovascular: Negative.  Negative for chest pain, palpitations and leg swelling.  Gastrointestinal: Negative.  Negative for abdominal pain, constipation, diarrhea, nausea and vomiting.  Genitourinary: Negative for dysuria, frequency and urgency.   Musculoskeletal: Negative for back pain and falls.  Skin: Negative.  Negative for rash.  Neurological: Positive for dizziness and headaches. Negative for weakness.  Endo/Heme/Allergies: Negative.  Does not bruise/bleed easily.  Psychiatric/Behavioral: Negative.  Negative for depression. The patient is not nervous/anxious and does not have insomnia.      Current treatment- IV iron and PRBC  Allergies  Allergen Reactions  . Bee Venom Anaphylaxis     Past Medical History:  Diagnosis Date  . Amenia 11/2018  . Blood transfusion without reported diagnosis   . Migraine   . PTSD (post-traumatic stress disorder)    reports daughter was murdered   . Tachycardia   . Thyroid disease   . Vitamin D deficiency      Past Surgical History:  Procedure Laterality Date  . BIOPSY  10/17/2019   Procedure: BIOPSY;  Surgeon: Doran Stabler, MD;  Location: [place];  Service: Gastroenterology;;  . COLONOSCOPY WITH PROPOFOL N/A 10/17/2019   Procedure: COLONOSCOPY WITH PROPOFOL;  Surgeon: Doran Stabler, MD;  Location: [place];  Service: Gastroenterology;  Laterality: N/A;  . ESOPHAGOGASTRODUODENOSCOPY (EGD) WITH PROPOFOL N/A 10/17/2019   Procedure: ESOPHAGOGASTRODUODENOSCOPY (EGD) WITH PROPOFOL;  Surgeon: Doran Stabler, MD;  Location: [place];  Service: Gastroenterology;  Laterality: N/A;  . TUBAL LIGATION      Social History   Socioeconomic History  . Marital status: Single    Spouse name: Not on file  . Number of children: Not on file  . Years of education: Not on file  . Highest education level: Not on file  Occupational History  . Not on file  Tobacco Use  . Smoking status: Never  Smoker  . Smokeless tobacco: Never Used  Substance and Sexual Activity  . Alcohol use: No  . Drug use: No  . Sexual activity: Not Currently    Birth control/protection: Surgical  Other Topics Concern  . Not on file  Social History Narrative   Pt relocated to [place] from  [place] around 2012.      Lives in [place]; with significant other. 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    Social Determinants of Health   Financial Resource Strain:   . Difficulty of Paying Living Expenses:   Food Insecurity:   . Worried About Charity fundraiser in the Last Year:   . Arboriculturist in the Last Year:   Transportation Needs:   . Film/video editor (Medical):   Marland Kitchen Lack of Transportation (Non-Medical):   Physical Activity:   . Days of Exercise per Week:   . Minutes of Exercise per Session:   Stress:   . Feeling of Stress :   Social Connections:   . Frequency of Communication with Friends and Family:   . Frequency of Social Gatherings with Friends and Family:   . Attends Religious Services:   . Active Member of Clubs or Organizations:   . Attends Archivist Meetings:   Marland Kitchen Marital Status:   Intimate Partner Violence:   . Fear of Current or Ex-Partner:   . Emotionally Abused:   Marland Kitchen Physically Abused:   . Sexually Abused:     Family History  Problem Relation Age of Onset  . Hypertension Mother   . Arthritis Mother   . Hypertension Son   . Cancer Maternal Aunt   . Diabetes Maternal Grandmother     No current facility-administered medications for this visit.  Current Outpatient Medications:  .  diphenhydrAMINE (BENADRYL) 25 mg capsule, Take 25-50 mg by mouth as needed (for allergic reactions)., Disp: , Rfl:  .  escitalopram (LEXAPRO) 20 MG tablet, Take 1 tablet (20 mg total) by mouth daily. (Patient taking differently: Take 20 mg by mouth at bedtime. ), Disp: 30 tablet, Rfl: 3 .  famotidine (PEPCID) 20 MG tablet, Take 2 tablets (40 mg total) by mouth 2 (two) times daily., Disp: 60 tablet, Rfl: 1 .  ferrous sulfate 325 (65 FE) MG tablet, Take 1 tablet (325 mg total) by mouth 3 (three) times daily with meals., Disp: 90 tablet, Rfl: 2 .  folic acid (FOLVITE) 1 MG tablet, Take 1 tablet (1 mg total) by mouth daily., Disp: 30 tablet,  Rfl: 3 .  meclizine (ANTIVERT) 25 MG tablet, Take 1 tablet (25 mg total) by mouth 2 (two) times daily as needed for dizziness., Disp: 30 tablet, Rfl: 1 .  Multiple Vitamins-Minerals (CENTRUM MULTIGUMMIES) CHEW, Chew 1-2 tablets by mouth daily., Disp: , Rfl:  .  SUMAtriptan (IMITREX) 50 MG tablet, Take 50 mg by mouth every 2 (two) hours as needed for migraine or headache (and may repeat in 2 hours if headache persists or recurs)., Disp: , Rfl:   Facility-Administered Medications Ordered in Other Visits:  .  0.9 %  sodium chloride infusion, 10 mL/hr, Intravenous, Once, Merlyn Lot, MD  Physical exam: There were no vitals filed for this visit. Physical Exam Constitutional:      Appearance: Normal appearance.  HENT:     Head: Normocephalic and atraumatic.  Eyes:     Pupils: Pupils are equal, round, and reactive to light.  Cardiovascular:     Rate and Rhythm: Normal rate  and regular rhythm.     Heart sounds: Normal heart sounds. No murmur.  Pulmonary:     Effort: Pulmonary effort is normal.     Breath sounds: Normal breath sounds. No wheezing.  Abdominal:     General: Bowel sounds are normal. There is no distension.     Palpations: Abdomen is soft.     Tenderness: There is no abdominal tenderness.  Musculoskeletal:        General: Normal range of motion.     Cervical back: Normal range of motion.  Skin:    General: Skin is warm and dry.     Findings: No rash.  Neurological:     Mental Status: She is alert and oriented to person, place, and time.  Psychiatric:        Mood and Affect: Affect is tearful.        Judgment: Judgment normal.      CMP Latest Ref Rng & Units 03/31/2020  Glucose 70 - 99 mg/dL 84  BUN 6 - 20 mg/dL 16  Creatinine 0.44 - 1.00 mg/dL 0.77  Sodium 135 - 145 mmol/L 135  Potassium 3.5 - 5.1 mmol/L 4.7  Chloride 98 - 111 mmol/L 103  CO2 22 - 32 mmol/L 23  Calcium 8.9 - 10.3 mg/dL 8.9  Total Protein 6.5 - 8.1 g/dL -  Total Bilirubin 0.3 - 1.2 mg/dL -   Alkaline Phos 38 - 126 U/L -  AST 15 - 41 U/L -  ALT 0 - 44 U/L -   CBC Latest Ref Rng & Units 03/31/2020  WBC 4.0 - 10.5 K/uL 10.5  Hemoglobin 12.0 - 15.0 g/dL 6.3(L)  Hematocrit 36.0 - 46.0 % 22.6(L)  Platelets 150 - 400 K/uL 876(H)    No images are attached to the encounter.  CT Angio Abd/Pel W and/or Wo Contrast  Result Date: 03/31/2020 CLINICAL DATA:  49 year old female with anemia. Evaluate for a source of active bleeding. EXAM: CTA ABDOMEN AND PELVIS WITHOUT AND WITH CONTRAST TECHNIQUE: Multidetector CT imaging of the abdomen and pelvis was performed using the standard protocol during bolus administration of intravenous contrast. Multiplanar reconstructed images and MIPs were obtained and reviewed to evaluate the vascular anatomy. CONTRAST:  179mL OMNIPAQUE IOHEXOL 350 MG/ML SOLN COMPARISON:  Prior CT abdomen/pelvis 03/01/2016 FINDINGS: VASCULAR Aorta: Normal caliber aorta without aneurysm, dissection, vasculitis or significant stenosis. Celiac: Patent without evidence of aneurysm, dissection, vasculitis or significant stenosis. SMA: Patent without evidence of aneurysm, dissection, vasculitis or significant stenosis. Renals: Both renal arteries are patent without evidence of aneurysm, dissection, vasculitis, fibromuscular dysplasia or significant stenosis. IMA: Patent without evidence of aneurysm, dissection, vasculitis or significant stenosis. Inflow: Patent without evidence of aneurysm, dissection, vasculitis or significant stenosis. Proximal Outflow: Bilateral common femoral and visualized portions of the superficial and profunda femoral arteries are patent without evidence of aneurysm, dissection, vasculitis or significant stenosis. Veins: No focal venous abnormality. Review of the MIP images confirms the above findings. NON-VASCULAR Lower chest: Large sliding hiatal hernia. The visualized cardiac structures are normal in size. No pericardial effusion. Atelectasis is present in the left  lower lobe secondary to the hiatal hernia. No suspicious pulmonary mass or nodule. Trace dependent atelectasis in the right lower lobe. Hepatobiliary: Normal hepatic contour and morphology. No solid lesion identified. Punctate low-attenuation lesions in the right hepatic lobe are stable dating back to 2017 and therefore benign. Gallbladder is unremarkable. No intra or extrahepatic biliary ductal dilatation. Pancreas: The junction of the body and tail of the pancreas  are bowed upward toward the hiatal hernia defect. No mass or inflammatory changes. Spleen: Normal in size without focal abnormality. Adrenals/Urinary Tract: Adrenal glands are unremarkable. Kidneys are normal, without renal calculi, focal lesion, or hydronephrosis. Bladder is unremarkable. Circumscribed simple cyst measuring 1.6 cm in the anterior lower pole of the left kidney. Stomach/Bowel: Large sliding hiatal hernia with completely intrathoracic stomach. No evidence of extravasation of contrast material into the bowel lumen to suggest an active GI bleed. Lymphatic: No suspicious lymphadenopathy. Reproductive: Uterus and bilateral adnexa are unremarkable. Tubal ligation clips present on the left. A second set of clips are visible in the left anterior peritoneal space but do not appear to be affiliated with the right adnexa. Other: Fat containing umbilical hernia.  No ascites. Musculoskeletal: No acute fracture or aggressive appearing lytic or blastic osseous lesion. IMPRESSION: VASCULAR 1. No evidence for active GI bleed at this time. 2. No evidence of significant atherosclerotic vascular disease, dissection, aneurysm or other acute vascular abnormality. NON-VASCULAR 1. No evidence of intra-abdominal or retroperitoneal bleeding or hematoma. 2. Large hiatal hernia with completely intrathoracic stomach. Additionally, the pancreas is bowed superiorly with the junction of the body and the tail being drawn upward toward the hernia defect. 3. Left-sided  tubal ligation clips. A second set of clips is also visualized within the peritoneal space but do not appear to be affiliated with the right-sided adnexa. 4. Small fat containing umbilical hernia. Electronically Signed   By: Jacqulynn Cadet M.D.   On: 03/31/2020 14:45     Assessment and plan- Patient is a 49 y.o. female who presents to [facility] for complaints of dizziness and headache for several hours.  Patient has complicated history of iron deficiency anemia with unknown etiology.  She has required several emergent blood transfusions in the past.  She has been seen by GI and has had a normal colonoscopy and endoscopy.  She has scheduled follow-up with GI for a capsule study which is not yet scheduled.  Since December 2020 she has received 7 IV Venofer infusions.  Most recent lab work from today reveals a hemoglobin of 6.3.  She is scheduled to get a unit of packed red blood cells.  Unfortunately, we are unable to get an IV due to poor venous access.  Several attempts were made by several RNs which was unsuccessful.  Given her symptoms, would recommend ED evaluation and possibly the IV team to place an IV for a blood transfusion.  Dr. Rogue Bussing agrees with plan.  He also recommends a CT of her abdomen given decrease in hemoglobin since yesterday from 6.8-6.3.  Plan: Give 1000 mg po tylenol.  Patient to report directly to the emergency room for IV placement and blood transfusion. Follow-up with GI as recommended for capsule study ASAP.  Disposition: RTC as scheduled for follow-up.    Visit Diagnosis 1. Severe anemia   2. Iron deficiency anemia due to chronic blood loss   3. Migraine with aura and with status migrainosus, not intractable     Patient expressed understanding and was in agreement with this plan. She also understands that She can call clinic at any time with any questions, concerns, or complaints.   Greater than 50% was spent in counseling and coordination of care with this  patient including but not limited to discussion of the relevant topics above (See A&P) including, but not limited to diagnosis and management of acute and chronic medical conditions.   Thank you for allowing me to participate in the care  of this very pleasant patient.    Jacquelin Hawking, NP [place] [facility] Cell - [phone] Pager- [phone] 03/31/2020 2:57 PM

## 2020-03-31 NOTE — ED Notes (Signed)
Pt states coming in from clinic because she was supposed to get a blood transfusion, but they were unable to get IV access. Pt states she normally gets blood transfusions 1-2 times a year. Pt states her headache is still present, but better. Pt states lights off is sufficient and does not want any pain medication for her headache at this time.

## 2020-03-31 NOTE — ED Provider Notes (Signed)
[facility] Emergency Department Provider Note    First MD Initiated Contact with Patient 03/31/20 1249     (approximate)  I have reviewed the triage vital signs and the nursing notes.   HISTORY  Chief Complaint Abnormal Lab    HPI Christina Mcbride is a 49 y.o. female [place] past medical history presents the ER for blood transfusion as well as headache.  Patient states that she has a history of recurrent migraines and she frequently gets them when her hemoglobin is low.  Was supposed to have outpatient transfusion today but they were unable to obtain IV. Denies any melena or hematochezia   Past Medical History:  Diagnosis Date  . Amenia 11/2018  . Blood transfusion without reported diagnosis   . Migraine   . PTSD (post-traumatic stress disorder)    reports daughter was murdered   . Tachycardia   . Thyroid disease   . Vitamin D deficiency    Family History  Problem Relation Age of Onset  . Hypertension Mother   . Arthritis Mother   . Hypertension Son   . Cancer Maternal Aunt   . Diabetes Maternal Grandmother    Past Surgical History:  Procedure Laterality Date  . BIOPSY  10/17/2019   Procedure: BIOPSY;  Surgeon: Doran Stabler, MD;  Location: [place];  Service: Gastroenterology;;  . COLONOSCOPY WITH PROPOFOL N/A 10/17/2019   Procedure: COLONOSCOPY WITH PROPOFOL;  Surgeon: Doran Stabler, MD;  Location: [place];  Service: Gastroenterology;  Laterality: N/A;  . ESOPHAGOGASTRODUODENOSCOPY (EGD) WITH PROPOFOL N/A 10/17/2019   Procedure: ESOPHAGOGASTRODUODENOSCOPY (EGD) WITH PROPOFOL;  Surgeon: Doran Stabler, MD;  Location: [place];  Service: Gastroenterology;  Laterality: N/A;  . TUBAL LIGATION     Patient Active Problem List   Diagnosis Date Noted  . Severe anemia 10/16/2019  . Orthostatic hypotension 11/29/2018  . Chronic daily headache 06/08/2014  . Gastroesophageal reflux disease without esophagitis 06/08/2014   . Iron deficiency anemia due to chronic blood loss 03/06/2014  . Left shoulder pain 03/06/2014  . Anemia 12/27/2013  . Symptomatic anemia 12/27/2013  . Tachycardia 12/27/2013  . Bilateral leg edema 12/27/2013  . Goiter 12/27/2013  . Dizziness 12/27/2013  . Generalized weakness 12/27/2013  . Iron deficiency anemia 12/27/2013  . Low iron stores 12/27/2013  . UTI (urinary tract infection) 12/27/2013  . PTSD (post-traumatic stress disorder)   . Migraine       Prior to Admission medications   Medication Sig Start Date End Date Taking? Authorizing Provider  diphenhydrAMINE (BENADRYL) 25 mg capsule Take 25-50 mg by mouth as needed (for allergic reactions).    [provider]  escitalopram (LEXAPRO) 20 MG tablet Take 1 tablet (20 mg total) by mouth daily. Patient taking differently: Take 20 mg by mouth at bedtime.  06/18/19   Kerin Perna, NP  famotidine (PEPCID) 20 MG tablet Take 2 tablets (40 mg total) by mouth 2 (two) times daily. 10/17/19 03/16/20  Shahmehdi, Valeria Batman, MD  ferrous sulfate 325 (65 FE) MG tablet Take 1 tablet (325 mg total) by mouth 3 (three) times daily with meals. 10/17/19 03/16/20  Shahmehdi, Valeria Batman, MD  folic acid (FOLVITE) 1 MG tablet Take 1 tablet (1 mg total) by mouth daily. 06/19/19   Kerin Perna, NP  meclizine (ANTIVERT) 25 MG tablet Take 1 tablet (25 mg total) by mouth 2 (two) times daily as needed for dizziness. 06/18/19   Kerin Perna, NP  Multiple Vitamins-Minerals (  CENTRUM MULTIGUMMIES) CHEW Chew 1-2 tablets by mouth daily.    [provider]  SUMAtriptan (IMITREX) 50 MG tablet Take 50 mg by mouth every 2 (two) hours as needed for migraine or headache (and may repeat in 2 hours if headache persists or recurs).    [provider]    Allergies Bee venom    Social History Social History   Tobacco Use  . Smoking status: Never Smoker  . Smokeless tobacco: Never Used  Substance Use Topics  . Alcohol use: No   . Drug use: No    Review of Systems Patient denies headaches, rhinorrhea, blurry vision, numbness, shortness of breath, chest pain, edema, cough, abdominal pain, nausea, vomiting, diarrhea, dysuria, fevers, rashes or hallucinations unless otherwise stated above in HPI. ____________________________________________   PHYSICAL EXAM:  VITAL SIGNS: Vitals:   03/31/20 1510 03/31/20 1515  BP: 123/87 (!) 141/79  Pulse: 100 87  Resp: 17 14  Temp:    SpO2: 100% 99%    Constitutional: Alert and oriented.  Eyes: Conjunctivae are normal.  Head: Atraumatic. Nose: No congestion/rhinnorhea. Mouth/Throat: Mucous membranes are moist.   Neck: No stridor. Painless ROM.  Cardiovascular: Normal rate, regular rhythm. Grossly normal heart sounds.  Good peripheral circulation. Respiratory: Normal respiratory effort.  No retractions. Lungs CTAB. Gastrointestinal: Soft and nontender. No distention. No abdominal bruits. No CVA tenderness. Genitourinary: brown stool that is guaiac positive. No BRBPR Musculoskeletal: No lower extremity tenderness nor edema.  No joint effusions. Neurologic:  Normal speech and language. No gross focal neurologic deficits are appreciated. No facial droop Skin:  Skin is warm, dry and intact. No rash noted. Psychiatric: Mood and affect are normal. Speech and behavior are normal.  ____________________________________________   LABS (all labs ordered are listed, but only abnormal results are displayed)  Results for orders placed or performed during the hospital encounter of 03/31/20 (from the past 24 hour(s))  Basic metabolic panel     Status: None   Collection Time: 03/31/20 10:49 AM  Result Value Ref Range   Sodium 135 135 - 145 mmol/L   Potassium 4.7 3.5 - 5.1 mmol/L   Chloride 103 98 - 111 mmol/L   CO2 23 22 - 32 mmol/L   Glucose, Bld 84 70 - 99 mg/dL   BUN 16 6 - 20 mg/dL   Creatinine, Ser 0.77 0.44 - 1.00 mg/dL   Calcium 8.9 8.9 - 10.3 mg/dL   GFR calc non Af  Amer >60 >60 mL/min   GFR calc Af Amer >60 >60 mL/min   Anion gap 9 5 - 15  Type and screen     Status: None (Preliminary result)   Collection Time: 03/31/20 11:36 AM  Result Value Ref Range   ABO/RH(D) B POS    Antibody Screen NEG    Sample Expiration 04/03/2020,2359    Unit Number [id number]    Blood Component Type RED CELLS,LR    Unit division 00    Status of Unit ISSUED    Transfusion Status OK TO TRANSFUSE    Crossmatch Result      Compatible Performed at [facility], [address] [place], [place] [address]    Unit Number [id number]    Blood Component Type RED CELLS,LR    Unit division 00    Status of Unit ALLOCATED    Transfusion Status OK TO TRANSFUSE    Crossmatch Result Compatible   Prepare RBC (crossmatch)     Status: None   Collection Time: 03/31/20  1:06  PM  Result Value Ref Range   Order Confirmation      ORDER PROCESSED BY BLOOD BANK Performed at [facility], [address] [place], [place] [address]   Reticulocytes     Status: Abnormal   Collection Time: 03/31/20  2:34 PM  Result Value Ref Range   Retic Ct Pct 5.6 (H) 0.4 - 3.1 %   RBC. 3.25 (L) 3.87 - 5.11 MIL/uL   Retic Count, Absolute 178.0 19.0 - 186.0 K/uL   Immature Retic Fract 42.3 (H) 2.3 - 15.9 %   ____________________________________________ ____________________________________________  RADIOLOGY  I personally reviewed all radiographic images ordered to evaluate for the above acute complaints and reviewed radiology reports and findings.  These findings were personally discussed with the patient.  Please see medical record for radiology report.  ____________________________________________   PROCEDURES  Procedure(s) performed:  Procedures Due to difficulty with obtaining IV access, a 20G peripheral IV catheter was inserted using US guidance into the RUE.  The site was prepped with chlorhexidine and allowed to dry.  The patient tolerated the procedure without  any complications.  Due to difficulty with obtaining IV access, a 20G peripheral IV catheter was inserted using US guidance into the RUE.  The site was prepped with chlorhexidine and allowed to dry.  The patient tolerated the procedure without any complications.    Critical Care performed: no ____________________________________________   INITIAL IMPRESSION / ASSESSMENT AND PLAN / ED COURSE  Pertinent labs & imaging results that were available during my care of the patient were reviewed by me and considered in my medical decision making (see chart for details).   DDX: Symptomatic anemia, he GIB, L GIB, mass, iron deficiency,  Jaylanis [name] is a 49 y.o. who presents to the ED with symptomatic anemia as described above.  Has had extensive work-up as an outpatient with recent upper and lower scopes without objective finding or source of bleeding.  She denies any vaginal bleeding she is on anticoagulation.  Denies any easy bleeding or bruising.  Is feeling some lightheadedness and a headache.  Will give migraine cocktail.  Will order transfusion.  Will consult with Dr. Jacinto Reap.   Clinical Course as of Mar 31 1545  Wed Mar 31, 2020  1352 Patient with very difficult peripheral access.  Was able to obtain to 20-gauge in the right upper extremity.  Per recommendations by Dr. Loletha Grayer will initiate transfusion with 2 units.  He is also requested CT angiogram to evaluate for evidence of or source of bleeding.   [PR]  [id number] Patient does have guaiac positive stool.  Will discuss with hospitalist for admission for PRBC transfusion and GI consultation.   [PR]    Clinical Course User Index [PR] Merlyn Lot, MD    The patient was evaluated in Emergency Department today for the symptoms described in the history of present illness. He/she was evaluated in the context of the global COVID-19 pandemic, which necessitated consideration that the patient might be at risk for infection with the SARS-CoV-2 virus that  causes COVID-19. Institutional protocols and algorithms that pertain to the evaluation of patients at risk for COVID-19 are in a state of rapid change based on information released by regulatory bodies including the CDC and federal and state organizations. These policies and algorithms were followed during the patient's care in the ED.  As part of my medical decision making, I reviewed the following data within the [place] notes reviewed and incorporated, Labs reviewed, notes  from prior ED visits and [place] Controlled Substance Database   ____________________________________________   FINAL CLINICAL IMPRESSION(S) / ED DIAGNOSES  Final diagnoses:  Symptomatic anemia  Gastrointestinal hemorrhage, unspecified gastrointestinal hemorrhage type      NEW MEDICATIONS STARTED DURING THIS VISIT:  New Prescriptions   No medications on file     Note:  This document was prepared using Dragon voice recognition software and may include unintentional dictation errors.    Merlyn Lot, MD 03/31/20 [phone]

## 2020-03-31 NOTE — Progress Notes (Signed)
Pt here to receive one unit of blood today. Pt presents to clinic crying while expressing to RN that she still "feels dizzy, SOB, and is experiencing the worst headache that she has ever had." She also states that she has been experiencing this headache since 4 am this morning. VSS. MD and treatment team updated. Sonia Baller NP at chairside to assess pt. Per NP to give pt tylenol 1000mg  PO. Multiple RNs attempted IV access but were unsuccessful. MD and treatment team notified, per Sonia Baller NP pt needs to be escorted to ER. Pt updated and escorted to ER with no complications.   Rolland Steinert [employer]

## 2020-03-31 NOTE — ED Triage Notes (Signed)
Here for blood transfusion from cancer center.  Nurse at cancer center was unable to obtain IV in pt.  Also c/o migraine pain.  CBC in results but type and screen from today does not appear active.

## 2020-03-31 NOTE — ED Notes (Signed)
Lab called for remaining blood draws due to pt being hard stick.

## 2020-03-31 NOTE — ED Notes (Signed)
ED MD at patient bedside with Ultrasound IV machine attempting to attain 2x IV sites for Blood Transfusion.

## 2020-03-31 NOTE — H&P (Signed)
History and Physical    Christina Mcbride Christina Mcbride DOB: [birth year] DOA: 03/31/2020  PCP: Kerin Perna, NP  Patient coming from: Home  I have personally briefly reviewed patient's old medical records in [place]  Chief Complaint: Weakness, fatigue  HPI: Christina Mcbride is a 49 y.o. female with medical history significant of chronic iron deficiency anemia requiring frequent IV iron infusions, history of migraine, PTSD, vitamin D deficiency who presented to the emergency department at the request of her hematologist Dr. Rogue Bussing for a blood transfusion.  Patient states that she has history of recurrent migraines and is frequently triggered when hemoglobin is low.  Patient was supposed to have an outpatient transfusion today however she is a difficult stick and they were unable to obtain IV access.  As such they were directed to the emergency department.  Presentation in the emergency department hemoglobin was found to be 6.3.  IV access was obtained.  Transfusion ordered by ED provider.  ED provider discussed with outpatient oncologist who recommended overnight observation stay for confirmation of stability and hemoglobin.  Hospitalist called for admission.   ED Course: IV access was obtained.  Hemoglobin was drawn and found to be 6.3.  Transfuse 1 unit.  Hospitalist called for overnight observation stay.  Review of Systems: As per HPI otherwise 10 point review of systems negative.    Past Medical History:  Diagnosis Date  . Amenia 11/2018  . Blood transfusion without reported diagnosis   . Migraine   . PTSD (post-traumatic stress disorder)    reports daughter was murdered   . Tachycardia   . Thyroid disease   . Vitamin D deficiency     Past Surgical History:  Procedure Laterality Date  . BIOPSY  10/17/2019   Procedure: BIOPSY;  Surgeon: Doran Stabler, MD;  Location: [place];  Service: Gastroenterology;;  . COLONOSCOPY WITH PROPOFOL N/A 10/17/2019    Procedure: COLONOSCOPY WITH PROPOFOL;  Surgeon: Doran Stabler, MD;  Location: [place];  Service: Gastroenterology;  Laterality: N/A;  . ESOPHAGOGASTRODUODENOSCOPY (EGD) WITH PROPOFOL N/A 10/17/2019   Procedure: ESOPHAGOGASTRODUODENOSCOPY (EGD) WITH PROPOFOL;  Surgeon: Doran Stabler, MD;  Location: [place];  Service: Gastroenterology;  Laterality: N/A;  . TUBAL LIGATION       reports that she has never smoked. She has never used smokeless tobacco. She reports that she does not drink alcohol or use drugs.  Allergies  Allergen Reactions  . Bee Venom Anaphylaxis    Family History  Problem Relation Age of Onset  . Hypertension Mother   . Arthritis Mother   . Hypertension Son   . Cancer Maternal Aunt   . Diabetes Maternal Grandmother     Prior to Admission medications   Medication Sig Start Date End Date Taking? Authorizing Provider  diphenhydrAMINE (BENADRYL) 25 mg capsule Take 25-50 mg by mouth as needed (for allergic reactions).    [provider]  escitalopram (LEXAPRO) 20 MG tablet Take 1 tablet (20 mg total) by mouth daily. Patient taking differently: Take 20 mg by mouth at bedtime.  06/18/19   Kerin Perna, NP  famotidine (PEPCID) 20 MG tablet Take 2 tablets (40 mg total) by mouth 2 (two) times daily. 10/17/19 03/16/20  Shahmehdi, Valeria Batman, MD  ferrous sulfate 325 (65 FE) MG tablet Take 1 tablet (325 mg total) by mouth 3 (three) times daily with meals. 10/17/19 03/16/20  Shahmehdi, Valeria Batman, MD  folic acid (FOLVITE) 1 MG tablet Take 1 tablet (1  mg total) by mouth daily. 06/19/19   Kerin Perna, NP  meclizine (ANTIVERT) 25 MG tablet Take 1 tablet (25 mg total) by mouth 2 (two) times daily as needed for dizziness. 06/18/19   Kerin Perna, NP  Multiple Vitamins-Minerals (CENTRUM MULTIGUMMIES) CHEW Chew 1-2 tablets by mouth daily.    [provider]  SUMAtriptan (IMITREX) 50 MG tablet Take 50 mg by mouth every 2 (two) hours as needed  for migraine or headache (and may repeat in 2 hours if headache persists or recurs).    [provider]    Physical Exam: Vitals:   03/31/20 1510 03/31/20 1515 03/31/20 1545 03/31/20 1550  BP: 123/87 (!) 141/79 137/88 131/86  Pulse: 100 87 72 75  Resp: 17 14 17 14   Temp:      TempSrc:      SpO2: 100% 99% 100% 98%  Weight:      Height:        Constitutional: NAD, calm, comfortable Vitals:   03/31/20 1510 03/31/20 1515 03/31/20 1545 03/31/20 1550  BP: 123/87 (!) 141/79 137/88 131/86  Pulse: 100 87 72 75  Resp: 17 14 17 14   Temp:      TempSrc:      SpO2: 100% 99% 100% 98%  Weight:      Height:       Eyes: PERRL, lids and conjunctivae normal ENMT: Mucous membranes are moist. Posterior pharynx clear of any exudate or lesions.Normal dentition.  Neck: normal, supple, no masses, no thyromegaly Respiratory: clear to auscultation bilaterally, no wheezing, no crackles. Normal respiratory effort. No accessory muscle use.  Cardiovascular: Regular rate and rhythm, no murmurs / rubs / gallops. No extremity edema. 2+ pedal pulses. No carotid bruits.  Abdomen: no tenderness, no masses palpated. No hepatosplenomegaly. Bowel sounds positive.  Musculoskeletal: no clubbing / cyanosis. No joint deformity upper and lower extremities. Good ROM, no contractures. Normal muscle tone.  Skin: no rashes, lesions, ulcers. No induration Neurologic: CN 2-12 grossly intact. Sensation intact, DTR normal. Strength 5/5 in all 4.  Psychiatric: Normal judgment and insight. Alert and oriented x 3. Normal mood.    Labs on Admission: I have personally reviewed following labs and imaging studies  CBC: Recent Labs  Lab 03/30/20 1339 03/31/20 0830  WBC 8.6 10.5  NEUTROABS 5.7 7.4  HGB 6.8* 6.3*  HCT 24.2* 22.6*  MCV 73.8* 72.4*  PLT 1,056* [id number]*   Basic Metabolic Panel: Recent Labs  Lab 03/31/20 1049  NA 135  K 4.7  CL 103  CO2 23  GLUCOSE 84  BUN 16  CREATININE 0.77  CALCIUM 8.9    GFR: Estimated Creatinine Clearance: 113.2 mL/min (by C-G formula based on SCr of 0.77 mg/dL). Liver Function Tests: No results for input(s): AST, ALT, ALKPHOS, BILITOT, PROT, ALBUMIN in the last 168 hours. No results for input(s): LIPASE, AMYLASE in the last 168 hours. No results for input(s): AMMONIA in the last 168 hours. Coagulation Profile: No results for input(s): INR, PROTIME in the last 168 hours. Cardiac Enzymes: No results for input(s): CKTOTAL, CKMB, CKMBINDEX, TROPONINI in the last 168 hours. BNP (last 3 results) No results for input(s): PROBNP in the last 8760 hours. HbA1C: No results for input(s): HGBA1C in the last 72 hours. CBG: No results for input(s): GLUCAP in the last 168 hours. Lipid Profile: No results for input(s): CHOL, HDL, LDLCALC, TRIG, CHOLHDL, LDLDIRECT in the last 72 hours. Thyroid Function Tests: No results for input(s): TSH, T4TOTAL, FREET4, T3FREE, THYROIDAB in the  last 72 hours. Anemia Panel: Recent Labs    03/31/20 1434  RETICCTPCT 5.6*   Urine analysis:    Component Value Date/Time   COLORURINE YELLOW 11/29/2018 0215   APPEARANCEUR HAZY (A) 11/29/2018 0215   LABSPEC 1.025 11/29/2018 0215   PHURINE 5.0 11/29/2018 0215   GLUCOSEU NEGATIVE 11/29/2018 0215   HGBUR SMALL (A) 11/29/2018 0215   BILIRUBINUR NEGATIVE 11/29/2018 0215   KETONESUR NEGATIVE 11/29/2018 0215   PROTEINUR NEGATIVE 11/29/2018 0215   UROBILINOGEN 0.2 07/10/2015 1115   NITRITE NEGATIVE 11/29/2018 0215   LEUKOCYTESUR NEGATIVE 11/29/2018 0215    Radiological Exams on Admission: CT Angio Abd/Pel W and/or Wo Contrast  Result Date: 03/31/2020 CLINICAL DATA:  48 year old female with anemia. Evaluate for a source of active bleeding. EXAM: CTA ABDOMEN AND PELVIS WITHOUT AND WITH CONTRAST TECHNIQUE: Multidetector CT imaging of the abdomen and pelvis was performed using the standard protocol during bolus administration of intravenous contrast. Multiplanar reconstructed images  and MIPs were obtained and reviewed to evaluate the vascular anatomy. CONTRAST:  150mL OMNIPAQUE IOHEXOL 350 MG/ML SOLN COMPARISON:  Prior CT abdomen/pelvis 03/01/2016 FINDINGS: VASCULAR Aorta: Normal caliber aorta without aneurysm, dissection, vasculitis or significant stenosis. Celiac: Patent without evidence of aneurysm, dissection, vasculitis or significant stenosis. SMA: Patent without evidence of aneurysm, dissection, vasculitis or significant stenosis. Renals: Both renal arteries are patent without evidence of aneurysm, dissection, vasculitis, fibromuscular dysplasia or significant stenosis. IMA: Patent without evidence of aneurysm, dissection, vasculitis or significant stenosis. Inflow: Patent without evidence of aneurysm, dissection, vasculitis or significant stenosis. Proximal Outflow: Bilateral common femoral and visualized portions of the superficial and profunda femoral arteries are patent without evidence of aneurysm, dissection, vasculitis or significant stenosis. Veins: No focal venous abnormality. Review of the MIP images confirms the above findings. NON-VASCULAR Lower chest: Large sliding hiatal hernia. The visualized cardiac structures are normal in size. No pericardial effusion. Atelectasis is present in the left lower lobe secondary to the hiatal hernia. No suspicious pulmonary mass or nodule. Trace dependent atelectasis in the right lower lobe. Hepatobiliary: Normal hepatic contour and morphology. No solid lesion identified. Punctate low-attenuation lesions in the right hepatic lobe are stable dating back to 2017 and therefore benign. Gallbladder is unremarkable. No intra or extrahepatic biliary ductal dilatation. Pancreas: The junction of the body and tail of the pancreas are bowed upward toward the hiatal hernia defect. No mass or inflammatory changes. Spleen: Normal in size without focal abnormality. Adrenals/Urinary Tract: Adrenal glands are unremarkable. Kidneys are normal, without renal  calculi, focal lesion, or hydronephrosis. Bladder is unremarkable. Circumscribed simple cyst measuring 1.6 cm in the anterior lower pole of the left kidney. Stomach/Bowel: Large sliding hiatal hernia with completely intrathoracic stomach. No evidence of extravasation of contrast material into the bowel lumen to suggest an active GI bleed. Lymphatic: No suspicious lymphadenopathy. Reproductive: Uterus and bilateral adnexa are unremarkable. Tubal ligation clips present on the left. A second set of clips are visible in the left anterior peritoneal space but do not appear to be affiliated with the right adnexa. Other: Fat containing umbilical hernia.  No ascites. Musculoskeletal: No acute fracture or aggressive appearing lytic or blastic osseous lesion. IMPRESSION: VASCULAR 1. No evidence for active GI bleed at this time. 2. No evidence of significant atherosclerotic vascular disease, dissection, aneurysm or other acute vascular abnormality. NON-VASCULAR 1. No evidence of intra-abdominal or retroperitoneal bleeding or hematoma. 2. Large hiatal hernia with completely intrathoracic stomach. Additionally, the pancreas is bowed superiorly with the junction of the body and the  tail being drawn upward toward the hernia defect. 3. Left-sided tubal ligation clips. A second set of clips is also visualized within the peritoneal space but do not appear to be affiliated with the right-sided adnexa. 4. Small fat containing umbilical hernia. Electronically Signed   By: Jacqulynn Cadet M.D.   On: 03/31/2020 14:45    EKG: Independently reviewed. NSR  Assessment/Plan Active Problems:   Symptomatic anemia  Symptomatic anemia Suspected GI bleed Patient has had extensive work-up as an outpatient Relatively recent upper and lower scopes without an objective source of bleeding No excessive vaginal bleeding noted Patient is on anticoagulation No history of easy bruising or bleeding Her only complaint is  headache Plan: Transfused 2 units in ED Twice daily p.o. Protonix Recheck hemoglobin in the morning We will touch base with oncology for further recommendations Inpatient GI consult versus outpatient referral  Migraine headaches Per patient they are triggered when she has low hemoglobin Migraine cocktail in ED Resume home migraine prophylaxis  Depression/PTSD Continue home Lexapro    DVT prophylaxis: SCDs Code Status: Full Family Communication: None today Disposition Plan: Anticipate discharge back to previous home environment.  Tentative date of discharge 04/01/2020 Consults called: None Admission status: Observation, MedSurg status   Sidney Ace MD [facility] Pager [phone]-  If 7PM-7AM, please contact night-coverage   03/31/2020, 4:00 PM

## 2020-04-01 ENCOUNTER — Telehealth: Payer: Self-pay | Admitting: Internal Medicine

## 2020-04-01 DIAGNOSIS — D473 Essential (hemorrhagic) thrombocythemia: Secondary | ICD-10-CM | POA: Diagnosis not present

## 2020-04-01 DIAGNOSIS — D649 Anemia, unspecified: Secondary | ICD-10-CM | POA: Diagnosis not present

## 2020-04-01 DIAGNOSIS — U071 COVID-19: Secondary | ICD-10-CM | POA: Diagnosis not present

## 2020-04-01 DIAGNOSIS — D500 Iron deficiency anemia secondary to blood loss (chronic): Secondary | ICD-10-CM

## 2020-04-01 LAB — TYPE AND SCREEN
ABO/RH(D): B POS
ABO/RH(D): B POS
Antibody Screen: NEGATIVE
Antibody Screen: NEGATIVE
Unit division: 0
Unit division: 0

## 2020-04-01 LAB — COMPREHENSIVE METABOLIC PANEL
ALT: 17 U/L (ref 0–44)
AST: 14 U/L — ABNORMAL LOW (ref 15–41)
Albumin: 3.2 g/dL — ABNORMAL LOW (ref 3.5–5.0)
Alkaline Phosphatase: 66 U/L (ref 38–126)
Anion gap: 7 (ref 5–15)
BUN: 10 mg/dL (ref 6–20)
CO2: 23 mmol/L (ref 22–32)
Calcium: 8.4 mg/dL — ABNORMAL LOW (ref 8.9–10.3)
Chloride: 103 mmol/L (ref 98–111)
Creatinine, Ser: 0.71 mg/dL (ref 0.44–1.00)
GFR calc Af Amer: >60 mL/min (ref >60)
GFR calc non Af Amer: >60 mL/min (ref >60)
Glucose, Bld: 109 mg/dL — ABNORMAL HIGH (ref 70–99)
Potassium: 3.8 mmol/L (ref 3.5–5.1)
Sodium: 133 mmol/L — ABNORMAL LOW (ref 135–145)
Total Bilirubin: 0.9 mg/dL (ref 0.3–1.2)
Total Protein: 7.6 g/dL (ref 6.5–8.1)

## 2020-04-01 LAB — CBC WITH DIFFERENTIAL/PLATELET
Abs Immature Granulocytes: 0.14 10*3/uL — ABNORMAL HIGH (ref 0.00–0.07)
Basophils Absolute: 0.1 10*3/uL (ref 0.0–0.1)
Basophils Relative: 0 %
Eosinophils Absolute: 0.1 10*3/uL (ref 0.0–0.5)
Eosinophils Relative: 1 %
HCT: 29.4 % — ABNORMAL LOW (ref 36.0–46.0)
Hemoglobin: 9.3 g/dL — ABNORMAL LOW (ref 12.0–15.0)
Immature Granulocytes: 1 %
Lymphocytes Relative: 11 %
Lymphs Abs: 1.3 10*3/uL (ref 0.7–4.0)
MCH: 23.7 pg — ABNORMAL LOW (ref 26.0–34.0)
MCHC: 31.6 g/dL (ref 30.0–36.0)
MCV: 74.8 fL — ABNORMAL LOW (ref 80.0–100.0)
Monocytes Absolute: 0.7 10*3/uL (ref 0.1–1.0)
Monocytes Relative: 6 %
Neutro Abs: 9.4 10*3/uL — ABNORMAL HIGH (ref 1.7–7.7)
Neutrophils Relative %: 81 %
Platelets: 636 10*3/uL — ABNORMAL HIGH (ref 150–400)
RBC: 3.93 MIL/uL (ref 3.87–5.11)
RDW: 22.8 % — ABNORMAL HIGH (ref 11.5–15.5)
WBC: 11.7 10*3/uL — ABNORMAL HIGH (ref 4.0–10.5)
nRBC: 1.5 % — ABNORMAL HIGH (ref 0.0–0.2)

## 2020-04-01 LAB — PHOSPHORUS: Phosphorus: 4.6 mg/dL (ref 2.5–4.6)

## 2020-04-01 LAB — BPAM RBC
Blood Product Expiration Date: 202105262359
Blood Product Expiration Date: 202106042359
ISSUE DATE / TIME: 202105051459
ISSUE DATE / TIME: 202105052110
Unit Type and Rh: 7300
Unit Type and Rh: 7300

## 2020-04-01 LAB — PROTIME-INR
INR: 1.2 (ref 0.8–1.2)
Prothrombin Time: 14.4 seconds (ref 11.4–15.2)

## 2020-04-01 LAB — HIV ANTIBODY (ROUTINE TESTING W REFLEX): HIV Screen 4th Generation wRfx: NONREACTIVE

## 2020-04-01 LAB — FIBRIN DERIVATIVES D-DIMER (ARMC ONLY): Fibrin derivatives D-dimer (ARMC): 664.5 ng/mL (FEU) — ABNORMAL HIGH (ref 0.00–499.00)

## 2020-04-01 LAB — FERRITIN: Ferritin: 89 ng/mL (ref 11–307)

## 2020-04-01 LAB — MAGNESIUM: Magnesium: 2.1 mg/dL (ref 1.7–2.4)

## 2020-04-01 NOTE — ED Notes (Signed)
Pt had a urinary accident while sleeping. Pt was able to stand for this RN to clean bed and apply new sheets. Faulty purewick cannister replaced and new purewick applied and is now working correctly. Pt still reports headache and now agrees to take tylenol and imitrex. Pt belongings bagged. Pt given water to drink. Pt denies any other needs at this time.

## 2020-04-01 NOTE — ED Notes (Signed)
Sherie handed off care to [place], Therapist, sports

## 2020-04-01 NOTE — ED Notes (Signed)
Denies nausea.  Says she still has bad headache.  Trying to get the imitrex from pharmacy.

## 2020-04-01 NOTE — Addendum Note (Signed)
Addended by: Gloris Ham on: 04/01/2020 09:03 AM   Modules accepted: Orders

## 2020-04-01 NOTE — Telephone Encounter (Signed)
  C- Schedule-follow-up in 2-3 weeks-MD; labs-CBC/BMP hold tube; Venofer/1 unit of PRBC. Thanks

## 2020-04-01 NOTE — ED Notes (Signed)
Sig pad doesn't work.

## 2020-04-01 NOTE — Progress Notes (Signed)
Physician Discharge Summary  Luda Vandeputte [id number] DOB: 30-Aug-1971 DOA: 03/31/2020  PCP: Kerin Perna, NP  Admit date: 03/31/2020 Discharge date: 04/01/2020  Admitted From: home Disposition:  Home   Recommendations for Outpatient Follow-up:  1. Follow up with PCP in 2 weeks 2. F/u w/ hematology in 2 weeks 3. F/u w/ GI for capsule study in 2 weeks   Home Health: no  Equipment/Devices:  Discharge Condition: stable  CODE STATUS: full  Diet recommendation: Regular   Brief/Interim Summary: HPI was taken from Dr. Priscella Mann: Barbara Cower is a 49 y.o. female with medical history significant of chronic iron deficiency anemia requiring frequent IV iron infusions, history of migraine, PTSD, vitamin D deficiency who presented to the emergency department at the request of her hematologist Dr. Rogue Bussing for a blood transfusion.  Patient states that she has history of recurrent migraines and is frequently triggered when hemoglobin is low.  Patient was supposed to have an outpatient transfusion today however she is a difficult stick and they were unable to obtain IV access.  As such they were directed to the emergency department.  Presentation in the emergency department hemoglobin was found to be 6.3.  IV access was obtained.  Transfusion ordered by ED provider.  ED provider discussed with outpatient oncologist who recommended overnight observation stay for confirmation of stability and hemoglobin.  Hospitalist called for admission.   ED Course: IV access was obtained.  Hemoglobin was drawn and found to be 6.3.  Transfuse 1 unit.  Hospitalist called for overnight observation stay.  Hospital Course from Dr. Lenna Sciara. Jimmye Norman 04/01/20: Pt received 2 units of pRBCs in the ER for symptomatic anemia. Pt's Hb on day of d/c was 9.3. Pt tolerated the transfusion well. Pt will f/u w/ Dr. Rogue Bussing in 2 weeks as well as GI to get a capsule study. Pt verbalized her understanding. Of note, pt was found to  have COVID19 infection but was asymptomatic so pt was not treated for this.  Discharge Diagnoses:  Active Problems:   Symptomatic anemia  Symptomatic anemia: relatively recent upper and lower scopes without an objective source of bleeding. S/p 2 units of PRBCs 03/31/20. H&H are trending up today. Recommend to f/u w/ GI outpatient  Migraines: continue on sumatriptan prn  Depression/PTSD: severity unknown. Continue on home dose of lexapro  Thrombocytosis: etiology unclear, likely reactive. Follows w/ heme as an outpatient    Discharge Instructions  Discharge Instructions    Diet - low sodium heart healthy   Complete by: As directed    Discharge instructions   Complete by: As directed    F/U PCP in 2 weeks; F/u hematology in 2 weeks; must quarantine for 2 weeks; F/u w/ GI for capsule study in 2 weeks   Increase activity slowly   Complete by: As directed      Allergies as of 04/01/2020      Reactions   Bee Venom Anaphylaxis      Medication List    TAKE these medications   diphenhydrAMINE 25 mg capsule Commonly known as: BENADRYL Take 25-50 mg by mouth as needed (for allergic reactions).       Allergies  Allergen Reactions  . Bee Venom Anaphylaxis    Consultations:   Procedures/Studies: DG CHEST PORT 1 VIEW  Result Date: 03/31/2020 CLINICAL DATA:  COVID positive EXAM: PORTABLE CHEST 1 VIEW COMPARISON:  10/15/2019, CT 03/31/2020 FINDINGS: Right lung is clear. Heart size is normal. Large hiatal hernia. Atelectasis at the left base. No pneumothorax. IMPRESSION:  Similar hiatal hernia and atelectasis at the left base. Electronically Signed   By: Donavan Foil M.D.   On: 03/31/2020 20:10   CT Angio Abd/Pel W and/or Wo Contrast  Result Date: 03/31/2020 CLINICAL DATA:  48 year old female with anemia. Evaluate for a source of active bleeding. EXAM: CTA ABDOMEN AND PELVIS WITHOUT AND WITH CONTRAST TECHNIQUE: Multidetector CT imaging of the abdomen and pelvis was performed using the  standard protocol during bolus administration of intravenous contrast. Multiplanar reconstructed images and MIPs were obtained and reviewed to evaluate the vascular anatomy. CONTRAST:  175mL OMNIPAQUE IOHEXOL 350 MG/ML SOLN COMPARISON:  Prior CT abdomen/pelvis 03/01/2016 FINDINGS: VASCULAR Aorta: Normal caliber aorta without aneurysm, dissection, vasculitis or significant stenosis. Celiac: Patent without evidence of aneurysm, dissection, vasculitis or significant stenosis. SMA: Patent without evidence of aneurysm, dissection, vasculitis or significant stenosis. Renals: Both renal arteries are patent without evidence of aneurysm, dissection, vasculitis, fibromuscular dysplasia or significant stenosis. IMA: Patent without evidence of aneurysm, dissection, vasculitis or significant stenosis. Inflow: Patent without evidence of aneurysm, dissection, vasculitis or significant stenosis. Proximal Outflow: Bilateral common femoral and visualized portions of the superficial and profunda femoral arteries are patent without evidence of aneurysm, dissection, vasculitis or significant stenosis. Veins: No focal venous abnormality. Review of the MIP images confirms the above findings. NON-VASCULAR Lower chest: Large sliding hiatal hernia. The visualized cardiac structures are normal in size. No pericardial effusion. Atelectasis is present in the left lower lobe secondary to the hiatal hernia. No suspicious pulmonary mass or nodule. Trace dependent atelectasis in the right lower lobe. Hepatobiliary: Normal hepatic contour and morphology. No solid lesion identified. Punctate low-attenuation lesions in the right hepatic lobe are stable dating back to 2017 and therefore benign. Gallbladder is unremarkable. No intra or extrahepatic biliary ductal dilatation. Pancreas: The junction of the body and tail of the pancreas are bowed upward toward the hiatal hernia defect. No mass or inflammatory changes. Spleen: Normal in size without focal  abnormality. Adrenals/Urinary Tract: Adrenal glands are unremarkable. Kidneys are normal, without renal calculi, focal lesion, or hydronephrosis. Bladder is unremarkable. Circumscribed simple cyst measuring 1.6 cm in the anterior lower pole of the left kidney. Stomach/Bowel: Large sliding hiatal hernia with completely intrathoracic stomach. No evidence of extravasation of contrast material into the bowel lumen to suggest an active GI bleed. Lymphatic: No suspicious lymphadenopathy. Reproductive: Uterus and bilateral adnexa are unremarkable. Tubal ligation clips present on the left. A second set of clips are visible in the left anterior peritoneal space but do not appear to be affiliated with the right adnexa. Other: Fat containing umbilical hernia.  No ascites. Musculoskeletal: No acute fracture or aggressive appearing lytic or blastic osseous lesion. IMPRESSION: VASCULAR 1. No evidence for active GI bleed at this time. 2. No evidence of significant atherosclerotic vascular disease, dissection, aneurysm or other acute vascular abnormality. NON-VASCULAR 1. No evidence of intra-abdominal or retroperitoneal bleeding or hematoma. 2. Large hiatal hernia with completely intrathoracic stomach. Additionally, the pancreas is bowed superiorly with the junction of the body and the tail being drawn upward toward the hernia defect. 3. Left-sided tubal ligation clips. A second set of clips is also visualized within the peritoneal space but do not appear to be affiliated with the right-sided adnexa. 4. Small fat containing umbilical hernia. Electronically Signed   By: Jacqulynn Cadet M.D.   On: 03/31/2020 14:45     Subjective: pt c/o fatigue    Discharge Exam: Vitals:   04/01/20 1200 04/01/20 1227  BP: 138/84 138/84  Pulse: 86 85  Resp: 14 14  Temp:  98.7 F (37.1 C)  SpO2: 98% 98%   Vitals:   04/01/20 1000 04/01/20 1100 04/01/20 1200 04/01/20 1227  BP: (!) 148/113 (!) 141/97 138/84 138/84  Pulse: 93 88 86 85   Resp: 13 17 14 14   Temp:    98.7 F (37.1 C)  TempSrc:    Oral  SpO2: 97% 97% 98% 98%  Weight:      Height:        General: Pt is alert, awake, not in acute distress Cardiovascular:  S1/S2 +, no rubs, no gallops Respiratory: CTA bilaterally, no wheezing, no rhonchi Abdominal: Soft, NT, ND, bowel sounds + Extremities: no edema, no cyanosis    The results of significant diagnostics from this hospitalization (including imaging, microbiology, ancillary and laboratory) are listed below for reference.     Microbiology: Recent Results (from the past 240 hour(s))  Respiratory Panel by RT PCR (Flu A&B, Covid) - Nasopharyngeal Swab     Status: Abnormal   Collection Time: 03/31/20  3:40 PM   Specimen: Nasopharyngeal Swab  Result Value Ref Range Status   SARS Coronavirus 2 by RT PCR POSITIVE (A) NEGATIVE Final    Comment: RESULT CALLED TO, READ BACK BY AND VERIFIED WITH: LEMONS,LORI AT [id number] ON 03/31/2020 BY MOSLEY,J  (NOTE) SARS-CoV-2 target nucleic acids are DETECTED. SARS-CoV-2 RNA is generally detectable in upper respiratory specimens  during the acute phase of infection. Positive results are indicative of the presence of the identified virus, but do not rule out bacterial infection or co-infection with other pathogens not detected by the test. Clinical correlation with patient history and other diagnostic information is necessary to determine patient infection status. The expected result is Negative. Fact Sheet for Patients:  PinkCheek.be Fact Sheet for Healthcare Providers: GravelBags.it This test is not yet approved or cleared by the [place] FDA and  has been authorized for detection and/or diagnosis of SARS-CoV-2 by FDA under an Emergency Use Authorization (EUA).  This EUA will remain in effect (meaning this test can b e used) for the duration of  the COVID-19 declaration under Section 564(b)(1) of the Act,  21 U.S.C. section 360bbb-3(b)(1), unless the authorization is terminated or revoked sooner.    Influenza A by PCR NEGATIVE NEGATIVE Final   Influenza B by PCR NEGATIVE NEGATIVE Final    Comment: (NOTE) The Xpert Xpress SARS-CoV-2/FLU/RSV assay is intended as an aid in  the diagnosis of influenza from Nasopharyngeal swab specimens and  should not be used as a sole basis for treatment. Nasal washings and  aspirates are unacceptable for Xpert Xpress SARS-CoV-2/FLU/RSV  testing. Fact Sheet for Patients: PinkCheek.be Fact Sheet for Healthcare Providers: GravelBags.it This test is not yet approved or cleared by the [place] FDA and  has been authorized for detection and/or diagnosis of SARS-CoV-2 by  FDA under an Emergency Use Authorization (EUA). This EUA will remain  in effect (meaning this test can be used) for the duration of the  Covid-19 declaration under Section 564(b)(1) of the Act, 21  U.S.C. section 360bbb-3(b)(1), unless the authorization is  terminated or revoked. Performed at [facility], [address] [place], [place] [address]      Labs: BNP (last 3 results) No results for input(s): BNP in the last 8760 hours. Basic Metabolic Panel: Recent Labs  Lab 03/31/20 1049 04/01/20 0647  NA 135 133*  K 4.7 3.8  CL 103 103  CO2 23 23  GLUCOSE 84  109*  BUN 16 10  CREATININE 0.77 0.71  CALCIUM 8.9 8.4*  MG  --  2.1  PHOS  --  4.6   Liver Function Tests: Recent Labs  Lab 04/01/20 0647  AST 14*  ALT 17  ALKPHOS 66  BILITOT 0.9  PROT 7.6  ALBUMIN 3.2*   No results for input(s): LIPASE, AMYLASE in the last 168 hours. No results for input(s): AMMONIA in the last 168 hours. CBC: Recent Labs  Lab 03/30/20 1339 03/31/20 0830 04/01/20 0647  WBC 8.6 10.5 11.7*  NEUTROABS 5.7 7.4 9.4*  HGB 6.8* 6.3* 9.3*  HCT 24.2* 22.6* 29.4*  MCV 73.8* 72.4* 74.8*  PLT 1,056* 876* 636*   Cardiac  Enzymes: No results for input(s): CKTOTAL, CKMB, CKMBINDEX, TROPONINI in the last 168 hours. BNP: Invalid input(s): POCBNP CBG: No results for input(s): GLUCAP in the last 168 hours. D-Dimer No results for input(s): DDIMER in the last 72 hours. Hgb A1c No results for input(s): HGBA1C in the last 72 hours. Lipid Profile No results for input(s): CHOL, HDL, LDLCALC, TRIG, CHOLHDL, LDLDIRECT in the last 72 hours. Thyroid function studies No results for input(s): TSH, T4TOTAL, T3FREE, THYROIDAB in the last 72 hours.  Invalid input(s): FREET3 Anemia work up Recent Labs    03/31/20 1434 04/01/20 0647  VITAMINB12 654  --   FOLATE 33.0  --   FERRITIN 103 89  TIBC 318  --   IRON 52  --   RETICCTPCT 5.6*  --    Urinalysis    Component Value Date/Time   COLORURINE YELLOW 11/29/2018 0215   APPEARANCEUR HAZY (A) 11/29/2018 0215   LABSPEC 1.025 11/29/2018 0215   PHURINE 5.0 11/29/2018 0215   GLUCOSEU NEGATIVE 11/29/2018 0215   HGBUR SMALL (A) 11/29/2018 0215   BILIRUBINUR NEGATIVE 11/29/2018 0215   KETONESUR NEGATIVE 11/29/2018 0215   PROTEINUR NEGATIVE 11/29/2018 0215   UROBILINOGEN 0.2 07/10/2015 1115   NITRITE NEGATIVE 11/29/2018 0215   LEUKOCYTESUR NEGATIVE 11/29/2018 0215   Sepsis Labs Invalid input(s): PROCALCITONIN,  WBC,  LACTICIDVEN Microbiology Recent Results (from the past 240 hour(s))  Respiratory Panel by RT PCR (Flu A&B, Covid) - Nasopharyngeal Swab     Status: Abnormal   Collection Time: 03/31/20  3:40 PM   Specimen: Nasopharyngeal Swab  Result Value Ref Range Status   SARS Coronavirus 2 by RT PCR POSITIVE (A) NEGATIVE Final    Comment: RESULT CALLED TO, READ BACK BY AND VERIFIED WITH: LEMONS,LORI AT [id number] ON 03/31/2020 BY MOSLEY,J  (NOTE) SARS-CoV-2 target nucleic acids are DETECTED. SARS-CoV-2 RNA is generally detectable in upper respiratory specimens  during the acute phase of infection. Positive results are indicative of the presence of the identified  virus, but do not rule out bacterial infection or co-infection with other pathogens not detected by the test. Clinical correlation with patient history and other diagnostic information is necessary to determine patient infection status. The expected result is Negative. Fact Sheet for Patients:  PinkCheek.be Fact Sheet for Healthcare Providers: GravelBags.it This test is not yet approved or cleared by the [place] FDA and  has been authorized for detection and/or diagnosis of SARS-CoV-2 by FDA under an Emergency Use Authorization (EUA).  This EUA will remain in effect (meaning this test can b e used) for the duration of  the COVID-19 declaration under Section 564(b)(1) of the Act, 21 U.S.C. section 360bbb-3(b)(1), unless the authorization is terminated or revoked sooner.    Influenza A by PCR NEGATIVE NEGATIVE Final   Influenza B  by PCR NEGATIVE NEGATIVE Final    Comment: (NOTE) The Xpert Xpress SARS-CoV-2/FLU/RSV assay is intended as an aid in  the diagnosis of influenza from Nasopharyngeal swab specimens and  should not be used as a sole basis for treatment. Nasal washings and  aspirates are unacceptable for Xpert Xpress SARS-CoV-2/FLU/RSV  testing. Fact Sheet for Patients: PinkCheek.be Fact Sheet for Healthcare Providers: GravelBags.it This test is not yet approved or cleared by the [place] FDA and  has been authorized for detection and/or diagnosis of SARS-CoV-2 by  FDA under an Emergency Use Authorization (EUA). This EUA will remain  in effect (meaning this test can be used) for the duration of the  Covid-19 declaration under Section 564(b)(1) of the Act, 21  U.S.C. section 360bbb-3(b)(1), unless the authorization is  terminated or revoked. Performed at [facility], [address], [place], [place] [address]      Time coordinating  discharge: Over 30 minutes  SIGNED:   Wyvonnia Dusky, MD  [facility] 04/01/2020, 2:51 PM Pager   If 7PM-7AM, please contact night-coverage [email or web address]

## 2020-04-13 ENCOUNTER — Telehealth: Payer: Self-pay | Admitting: Gastroenterology

## 2020-04-13 NOTE — Telephone Encounter (Signed)
Patient called to schedule for capsule endo

## 2020-04-13 NOTE — Telephone Encounter (Signed)
Pt calling to schedule capsule endo. Please advise if ok to go ahead and schedule or would you want pt seen since we were trying to schedule this in December. Please advise.

## 2020-04-14 NOTE — Telephone Encounter (Signed)
I reviewed chart since December and see that she still has severe IDA requiring IV iron with Hematology.  Yes, she should be scheduled for a capsule study for IDA. Must be willing to take the required bowel prep (did not take all prep for colonoscopy in hospital)  - HD

## 2020-04-15 DIAGNOSIS — U071 COVID-19: Secondary | ICD-10-CM

## 2020-04-15 HISTORY — DX: COVID-19: U07.1

## 2020-04-19 NOTE — Telephone Encounter (Signed)
Attempted to reach pt but received message that pts voice mailbox has not been set up yet. Will try again.

## 2020-04-21 ENCOUNTER — Encounter: Payer: Self-pay | Admitting: Internal Medicine

## 2020-04-21 ENCOUNTER — Other Ambulatory Visit: Payer: Self-pay

## 2020-04-21 DIAGNOSIS — D509 Iron deficiency anemia, unspecified: Secondary | ICD-10-CM

## 2020-04-21 NOTE — Telephone Encounter (Signed)
Pt scheduled for capsule endo 05/03/20 at 8:30am. Ambulatory referral in epic. Pt sent instructions via mychart.

## 2020-04-21 NOTE — Progress Notes (Signed)
Patient prescreened for appointment. Patient has no concerns or questions.  

## 2020-04-22 ENCOUNTER — Inpatient Hospital Stay: Payer: BC Managed Care – PPO

## 2020-04-22 ENCOUNTER — Encounter: Payer: Self-pay | Admitting: Internal Medicine

## 2020-04-22 ENCOUNTER — Inpatient Hospital Stay (HOSPITAL_BASED_OUTPATIENT_CLINIC_OR_DEPARTMENT_OTHER): Payer: BC Managed Care – PPO | Admitting: Internal Medicine

## 2020-04-22 VITALS — BP 112/78 | HR 84

## 2020-04-22 DIAGNOSIS — D649 Anemia, unspecified: Secondary | ICD-10-CM

## 2020-04-22 DIAGNOSIS — D500 Iron deficiency anemia secondary to blood loss (chronic): Secondary | ICD-10-CM

## 2020-04-22 LAB — SAMPLE TO BLOOD BANK

## 2020-04-22 LAB — CBC WITH DIFFERENTIAL/PLATELET
Abs Immature Granulocytes: 0.07 10*3/uL (ref 0.00–0.07)
Basophils Absolute: 0.1 10*3/uL (ref 0.0–0.1)
Basophils Relative: 1 %
Eosinophils Absolute: 0.2 10*3/uL (ref 0.0–0.5)
Eosinophils Relative: 2 %
HCT: 27.5 % — ABNORMAL LOW (ref 36.0–46.0)
Hemoglobin: 8.3 g/dL — ABNORMAL LOW (ref 12.0–15.0)
Immature Granulocytes: 1 %
Lymphocytes Relative: 11 %
Lymphs Abs: 1 10*3/uL (ref 0.7–4.0)
MCH: 23.3 pg — ABNORMAL LOW (ref 26.0–34.0)
MCHC: 30.2 g/dL (ref 30.0–36.0)
MCV: 77.2 fL — ABNORMAL LOW (ref 80.0–100.0)
Monocytes Absolute: 0.7 10*3/uL (ref 0.1–1.0)
Monocytes Relative: 7 %
Neutro Abs: 7.4 10*3/uL (ref 1.7–7.7)
Neutrophils Relative %: 78 %
Platelets: 613 10*3/uL — ABNORMAL HIGH (ref 150–400)
RBC: 3.56 MIL/uL — ABNORMAL LOW (ref 3.87–5.11)
RDW: 22.4 % — ABNORMAL HIGH (ref 11.5–15.5)
WBC: 9.4 10*3/uL (ref 4.0–10.5)
nRBC: 0.2 % (ref 0.0–0.2)

## 2020-04-22 LAB — BASIC METABOLIC PANEL
Anion gap: 7 (ref 5–15)
BUN: 16 mg/dL (ref 6–20)
CO2: 27 mmol/L (ref 22–32)
Calcium: 8.4 mg/dL — ABNORMAL LOW (ref 8.9–10.3)
Chloride: 103 mmol/L (ref 98–111)
Creatinine, Ser: 0.88 mg/dL (ref 0.44–1.00)
GFR calc Af Amer: >60 mL/min (ref >60)
GFR calc non Af Amer: >60 mL/min (ref >60)
Glucose, Bld: 99 mg/dL (ref 70–99)
Potassium: 4.3 mmol/L (ref 3.5–5.1)
Sodium: 137 mmol/L (ref 135–145)

## 2020-04-22 MED ORDER — IRON SUCROSE 20 MG/ML IV SOLN
200.0000 mg | Freq: Once | INTRAVENOUS | Status: AC
  Administered 2020-04-22: 200 mg via INTRAVENOUS
  Filled 2020-04-22: qty 10

## 2020-04-22 MED ORDER — SODIUM CHLORIDE 0.9 % IV SOLN
Freq: Once | INTRAVENOUS | Status: AC
  Filled 2020-04-22: qty 250

## 2020-04-22 NOTE — Assessment & Plan Note (Addendum)
#   Severe iron deficient anemia-question etiology- on IV venofer- Hb  8.3 proceed with venofer today.    # Iron deficiency secondary to possible blood loss [FOBT positive]; May 2021 CT abdomen pelvis- question secondary to large hiatal hernia; awaiting- capsule study- planned on June 7th [ Dr.Danis]. awaiting weight loss/bariatric clinic on June 3rd.  Discussed that the treatment for hiatal hernia is usually surgical; patient symptomatic with reflux.  Will defer to GI/surgical evaluation.   # DISPOSITION:  #  venofer today # follow up as planned on June 22nd-MD;cbc/possible venofer-Dr.B 

## 2020-04-22 NOTE — Progress Notes (Signed)
Patient states today at follow up appointment she has been experiencing some lower mid abdominal pain. She rates pain at 6. States has been going on for some time.

## 2020-04-22 NOTE — Progress Notes (Signed)
[facility] NOTE  Patient Care Team: Kerin Perna, NP as PCP - General (Internal Medicine) Cammie Sickle, MD as Consulting Physician (Internal Medicine)  CHIEF COMPLAINTS/PURPOSE OF CONSULTATION: severe anemia  HEMATOLOGY HISTORY  # IRON DEFICIENCY ANEMIA [since 2015; NOV 2020 nadir Hb- 3.6-s/p multiple PRBC transfusions] NOV 2020-EGD/colonoscopy- unremarkable [Dr.Danis; [facility]]; no menorrhagia. PNH-Normal; NO Hemolysis.  May 2021-CT scan large hiatal hernia-question reason for blood loss; awaiting capsule study.  # Migraines/ PTSD  HISTORY OF PRESENTING ILLNESS:  [name] 49 y.o.  female severe iron deficiency of unclear etiology is here for follow-up.  Patient in interim was evaluated in the emergency room after noted to have a hemoglobin of 6.8.  Patient is CT scan of the abdomen pelvis that showed large hiatal hernia; otherwise no other acute etiology was noted.  Patient complains of reflux-like symptoms.   Review of Systems  Constitutional: Positive for malaise/fatigue. Negative for chills, diaphoresis, fever and weight loss.  HENT: Negative for nosebleeds and sore throat.   Eyes: Negative for double vision.  Respiratory: Negative for cough, hemoptysis, sputum production and wheezing.   Cardiovascular: Negative for chest pain, palpitations, orthopnea and leg swelling.  Gastrointestinal: Positive for heartburn. Negative for abdominal pain, blood in stool, constipation, diarrhea, melena and vomiting.  Genitourinary: Negative for dysuria, frequency and urgency.  Musculoskeletal: Positive for joint pain. Negative for back pain.  Skin: Negative.  Negative for itching and rash.  Neurological: Negative for tingling, focal weakness, weakness and headaches.  Endo/Heme/Allergies: Does not bruise/bleed easily.  Psychiatric/Behavioral: Negative for depression. The patient is not nervous/anxious and does not have insomnia.    MEDICAL HISTORY:   Past Medical History:  Diagnosis Date  . Amenia 11/2018  . Blood transfusion without reported diagnosis   . Migraine   . PTSD (post-traumatic stress disorder)    reports daughter was murdered   . Tachycardia   . Thyroid disease   . Vitamin D deficiency     SURGICAL HISTORY: Past Surgical History:  Procedure Laterality Date  . BIOPSY  10/17/2019   Procedure: BIOPSY;  Surgeon: Doran Stabler, MD;  Location: [facility];  Service: Gastroenterology;;  . COLONOSCOPY WITH PROPOFOL N/A 10/17/2019   Procedure: COLONOSCOPY WITH PROPOFOL;  Surgeon: Doran Stabler, MD;  Location: [facility];  Service: Gastroenterology;  Laterality: N/A;  . ESOPHAGOGASTRODUODENOSCOPY (EGD) WITH PROPOFOL N/A 10/17/2019   Procedure: ESOPHAGOGASTRODUODENOSCOPY (EGD) WITH PROPOFOL;  Surgeon: Doran Stabler, MD;  Location: [facility];  Service: Gastroenterology;  Laterality: N/A;  . TUBAL LIGATION      SOCIAL HISTORY: Social History   Socioeconomic History  . Marital status: Single    Spouse name: Not on file  . Number of children: Not on file  . Years of education: Not on file  . Highest education level: Not on file  Occupational History  . Not on file  Tobacco Use  . Smoking status: Never Smoker  . Smokeless tobacco: Never Used  Substance and Sexual Activity  . Alcohol use: No  . Drug use: No  . Sexual activity: Not Currently    Birth control/protection: Surgical  Other Topics Concern  . Not on file  Social History Narrative   Pt relocated to [place] from [place] around 2012.      Lives in [place]; with significant other. 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    Social Determinants of Health   Financial Resource Strain:   . Difficulty of Paying Living  Expenses:   Food Insecurity:   . Worried About Charity fundraiser in the Last Year:   . Arboriculturist in the Last Year:   Transportation Needs:   . Film/video editor (Medical):   Marland Kitchen Lack of  Transportation (Non-Medical):   Physical Activity:   . Days of Exercise per Week:   . Minutes of Exercise per Session:   Stress:   . Feeling of Stress :   Social Connections:   . Frequency of Communication with Friends and Family:   . Frequency of Social Gatherings with Friends and Family:   . Attends Religious Services:   . Active Member of Clubs or Organizations:   . Attends Archivist Meetings:   Marland Kitchen Marital Status:   Intimate Partner Violence:   . Fear of Current or Ex-Partner:   . Emotionally Abused:   Marland Kitchen Physically Abused:   . Sexually Abused:     FAMILY HISTORY: Family History  Problem Relation Age of Onset  . Hypertension Mother   . Arthritis Mother   . Hypertension Son   . Cancer Maternal Aunt   . Diabetes Maternal Grandmother     ALLERGIES:  is allergic to bee venom.  MEDICATIONS:  Current Outpatient Medications  Medication Sig Dispense Refill  . diphenhydrAMINE (BENADRYL) 25 mg capsule Take 25-50 mg by mouth as needed (for allergic reactions).     No current facility-administered medications for this visit.    PHYSICAL EXAMINATION:   Vitals:   04/22/20 0850  BP: 136/87  Pulse: 99  Resp: 20  Temp: (!) 97 F (36.1 C)  SpO2: 99%   Filed Weights   04/22/20 0850  Weight: 268 lb 9.6 oz (121.8 kg)    Physical Exam  Constitutional: She is oriented to person, place, and time and well-developed, well-nourished, and in no distress.  HENT:  Head: Normocephalic and atraumatic.  Mouth/Throat: Oropharynx is clear and moist. No oropharyngeal exudate.  Eyes: Pupils are equal, round, and reactive to light.  Cardiovascular: Normal rate and regular rhythm.  Pulmonary/Chest: Effort normal and breath sounds normal. No respiratory distress. She has no wheezes.  Abdominal: Soft. Bowel sounds are normal. She exhibits no distension and no mass. There is no abdominal tenderness. There is no rebound and no guarding.  Musculoskeletal:        General: No  tenderness or edema. Normal range of motion.     Cervical back: Normal range of motion and neck supple.  Neurological: She is alert and oriented to person, place, and time.  Skin: Skin is warm.  Psychiatric: Affect normal.    LABORATORY DATA:  I have reviewed the data as listed Lab Results  Component Value Date   WBC 9.4 04/22/2020   HGB 8.3 (L) 04/22/2020   HCT 27.5 (L) 04/22/2020   MCV 77.2 (L) 04/22/2020   PLT 613 (H) 04/22/2020   Recent Labs    10/16/19 0250 10/16/19 0250 03/31/20 1049 04/01/20 0647 04/22/20 0826  NA 139   < > 135 133* 137  K 4.0   < > 4.7 3.8 4.3  CL 108   < > 103 103 103  CO2 23   < > 23 23 27   GLUCOSE 102*   < > 84 109* 99  BUN 14   < > 16 10 16   CREATININE 0.91   < > 0.77 0.71 0.88  CALCIUM 8.6*   < > 8.9 8.4* 8.4*  GFRNONAA >60   < > >60 >60 >  60  GFRAA >60   < > >60 >60 >60  PROT 7.3  --   --  7.6  --   ALBUMIN 3.1*  --   --  3.2*  --   AST 12*  --   --  14*  --   ALT 9  --   --  17  --   ALKPHOS 70  --   --  66  --   BILITOT <0.1*  --   --  0.9  --    < > = values in this interval not displayed.     DG CHEST PORT 1 VIEW  Result Date: 03/31/2020 CLINICAL DATA:  COVID positive EXAM: PORTABLE CHEST 1 VIEW COMPARISON:  10/15/2019, CT 03/31/2020 FINDINGS: Right lung is clear. Heart size is normal. Large hiatal hernia. Atelectasis at the left base. No pneumothorax. IMPRESSION: Similar hiatal hernia and atelectasis at the left base. Electronically Signed   By: Donavan Foil M.D.   On: 03/31/2020 20:10   CT Angio Abd/Pel W and/or Wo Contrast  Result Date: 03/31/2020 CLINICAL DATA:  49 year old female with anemia. Evaluate for a source of active bleeding. EXAM: CTA ABDOMEN AND PELVIS WITHOUT AND WITH CONTRAST TECHNIQUE: Multidetector CT imaging of the abdomen and pelvis was performed using the standard protocol during bolus administration of intravenous contrast. Multiplanar reconstructed images and MIPs were obtained and reviewed to evaluate the  vascular anatomy. CONTRAST:  133mL OMNIPAQUE IOHEXOL 350 MG/ML SOLN COMPARISON:  Prior CT abdomen/pelvis 03/01/2016 FINDINGS: VASCULAR Aorta: Normal caliber aorta without aneurysm, dissection, vasculitis or significant stenosis. Celiac: Patent without evidence of aneurysm, dissection, vasculitis or significant stenosis. SMA: Patent without evidence of aneurysm, dissection, vasculitis or significant stenosis. Renals: Both renal arteries are patent without evidence of aneurysm, dissection, vasculitis, fibromuscular dysplasia or significant stenosis. IMA: Patent without evidence of aneurysm, dissection, vasculitis or significant stenosis. Inflow: Patent without evidence of aneurysm, dissection, vasculitis or significant stenosis. Proximal Outflow: Bilateral common femoral and visualized portions of the superficial and profunda femoral arteries are patent without evidence of aneurysm, dissection, vasculitis or significant stenosis. Veins: No focal venous abnormality. Review of the MIP images confirms the above findings. NON-VASCULAR Lower chest: Large sliding hiatal hernia. The visualized cardiac structures are normal in size. No pericardial effusion. Atelectasis is present in the left lower lobe secondary to the hiatal hernia. No suspicious pulmonary mass or nodule. Trace dependent atelectasis in the right lower lobe. Hepatobiliary: Normal hepatic contour and morphology. No solid lesion identified. Punctate low-attenuation lesions in the right hepatic lobe are stable dating back to 2017 and therefore benign. Gallbladder is unremarkable. No intra or extrahepatic biliary ductal dilatation. Pancreas: The junction of the body and tail of the pancreas are bowed upward toward the hiatal hernia defect. No mass or inflammatory changes. Spleen: Normal in size without focal abnormality. Adrenals/Urinary Tract: Adrenal glands are unremarkable. Kidneys are normal, without renal calculi, focal lesion, or hydronephrosis. Bladder is  unremarkable. Circumscribed simple cyst measuring 1.6 cm in the anterior lower pole of the left kidney. Stomach/Bowel: Large sliding hiatal hernia with completely intrathoracic stomach. No evidence of extravasation of contrast material into the bowel lumen to suggest an active GI bleed. Lymphatic: No suspicious lymphadenopathy. Reproductive: Uterus and bilateral adnexa are unremarkable. Tubal ligation clips present on the left. A second set of clips are visible in the left anterior peritoneal space but do not appear to be affiliated with the right adnexa. Other: Fat containing umbilical hernia.  No ascites. Musculoskeletal: No acute fracture or  aggressive appearing lytic or blastic osseous lesion. IMPRESSION: VASCULAR 1. No evidence for active GI bleed at this time. 2. No evidence of significant atherosclerotic vascular disease, dissection, aneurysm or other acute vascular abnormality. NON-VASCULAR 1. No evidence of intra-abdominal or retroperitoneal bleeding or hematoma. 2. Large hiatal hernia with completely intrathoracic stomach. Additionally, the pancreas is bowed superiorly with the junction of the body and the tail being drawn upward toward the hernia defect. 3. Left-sided tubal ligation clips. A second set of clips is also visualized within the peritoneal space but do not appear to be affiliated with the right-sided adnexa. 4. Small fat containing umbilical hernia. Electronically Signed   By: Jacqulynn Cadet M.D.   On: 03/31/2020 14:45    Iron deficiency anemia due to chronic blood loss # Severe iron deficient anemia-question etiology- on IV venofer- Hb  8.3 proceed with venofer today.    # Iron deficiency secondary to possible blood loss [FOBT positive]; May 2021 CT abdomen pelvis- question secondary to large hiatal hernia; awaiting- capsule study- planned on June 7th [ Dr.Danis]. awaiting weight loss/bariatric clinic on June 3rd.  Discussed that the treatment for hiatal hernia is usually surgical;  patient symptomatic with reflux.  Will defer to GI/surgical evaluation.   # DISPOSITION:  #  venofer today # follow up as planned on June 22nd-MD;cbc/possible venofer-Dr.B  All questions were answered. The patient knows to call the clinic with any problems, questions or concerns.    Cammie Sickle, MD 04/26/2020 6:51 PM

## 2020-05-03 ENCOUNTER — Encounter: Payer: Self-pay | Admitting: Gastroenterology

## 2020-05-03 ENCOUNTER — Ambulatory Visit (INDEPENDENT_AMBULATORY_CARE_PROVIDER_SITE_OTHER): Payer: BLUE CROSS/BLUE SHIELD | Admitting: Gastroenterology

## 2020-05-03 DIAGNOSIS — D500 Iron deficiency anemia secondary to blood loss (chronic): Secondary | ICD-10-CM

## 2020-05-03 NOTE — Progress Notes (Signed)

## 2020-05-03 NOTE — Patient Instructions (Signed)
LOT# [id number] EXP: 05/14/2021 SN # [id number] Patient tolerated well, no complications. Pt advised to call back with any questions or concerns.

## 2020-05-17 ENCOUNTER — Telehealth: Payer: Self-pay | Admitting: Internal Medicine

## 2020-05-17 NOTE — Telephone Encounter (Signed)
Patient phoned on this date and stated that she would not be able to attend her appt on 05-18-20 due to a scheduling conflict. Patient's appts rescheduled to 06-01-20. Patient stated that should she feel the need to be seen sooner, she would phone and move appts earlier.

## 2020-05-18 ENCOUNTER — Inpatient Hospital Stay: Payer: Medicare HMO

## 2020-05-18 ENCOUNTER — Inpatient Hospital Stay: Payer: Medicare HMO | Admitting: Internal Medicine

## 2020-05-19 ENCOUNTER — Telehealth: Payer: Self-pay | Admitting: Gastroenterology

## 2020-05-19 NOTE — Telephone Encounter (Signed)
[place],    Please let this patient know that I reviewed the video capsule study and did not see any source of blood loss.  I see that she has followed with a hematologist for IV iron, and it is very important that she continue to do so.  If she needs to see me for her reflux symptoms or hiatal hernia, please arrange a follow up clinic visit.  Thanks  - H. Danis  ____________________________________________  Dr. Rogue Bussing,    No source for GI blood loss on VCE (and no source on EGD or colonoscopy November 2020.) Was reportedly heme positive during that admission, ? Accuracy of FOBT.  Seems she does not absorb iron well and will need long term IV iron.  Thanks for following her.  [facility]

## 2020-05-19 NOTE — Telephone Encounter (Signed)
Called pt, no voicemail set up, unable to leave message.

## 2020-05-19 NOTE — Telephone Encounter (Signed)
Thank you for the update. GB

## 2020-05-20 ENCOUNTER — Telehealth (INDEPENDENT_AMBULATORY_CARE_PROVIDER_SITE_OTHER): Payer: BC Managed Care – PPO | Admitting: Primary Care

## 2020-05-20 DIAGNOSIS — D500 Iron deficiency anemia secondary to blood loss (chronic): Secondary | ICD-10-CM | POA: Diagnosis not present

## 2020-05-20 DIAGNOSIS — Z09 Encounter for follow-up examination after completed treatment for conditions other than malignant neoplasm: Secondary | ICD-10-CM

## 2020-05-20 NOTE — Progress Notes (Addendum)
Virtual Visit via Telephone Note  I connected with Christina Mcbride on 05/20/20 at  2:50 PM EDT by telephone and verified that I am speaking with the correct person using two identifiers.   I discussed the limitations, risks, security and privacy concerns of performing an evaluation and management service by telephone and the availability of in person appointments. I also discussed with the patient that there may be a patient responsible charge related to this service. The patient expressed understanding and agreed to proceed.  Patient was at home Christina Mire, NP was at [facility]   History of Present Illness: Ms.Christina Mcbride is a 49 year old obese female having a virtual visit for hospital follow up on 03/31/2020 symptomatic anemia. She had a Capsule  endocoscopy  04/22/20  She is followed by gastrology and oncology treatment for iron deficiency anemias.  Past Medical History:  Diagnosis Date  . Amenia 11/2018  . Blood transfusion without reported diagnosis   . Migraine   . PTSD (post-traumatic stress disorder)    reports daughter was murdered   . Tachycardia   . Thyroid disease   . Vitamin D deficiency    Current Outpatient Medications on File Prior to Visit  Medication Sig Dispense Refill  . diphenhydrAMINE (BENADRYL) 25 mg capsule Take 25-50 mg by mouth as needed (for allergic reactions).     No current facility-administered medications on file prior to visit.   Observations/Objective: Review of Systems  All other systems reviewed and are negative.  Assessment and Plan: Diagnoses and all orders for this visit:  Iron deficiency anemia due to chronic blood loss She is being evaluated by oncology and gastrology for the source of acute blood loss and treated Dr.Danis asked to give results at this encounter the video capsule study did not see any source of blood loss. Follow up for IV iron with hematologist.  Hospital discharge follow-up Presented to the ER on  03/31/2020 for critical labs requiring a blood trans fusion and headache. Originally she was to have blood transfusion during oncology encounter but was not able to find a vein and advised to go to ED   Follow Up Instructions:    I discussed the assessment and treatment plan with the patient. The patient was provided an opportunity to ask questions and all were answered. The patient agreed with the plan and demonstrated an understanding of the instructions.   The patient was advised to call back or seek an in-person evaluation if the symptoms worsen or if the condition fails to improve as anticipated.  I provided 10 minutes of non-face-to-face time during this encounter. Includes reviewing labs, encounters and imaging.    Christina Perna, NP

## 2020-05-20 NOTE — Telephone Encounter (Signed)
[name],    This is a mutual patient I saw for iron deficiency anemia.  Small bowel video capsule study done.  Tried to have my nurse convey normal results, but patient's phone without voicemail.  It appears she has an appointment with you today.  If you see this message beforehand, perhaps you would be good enough to pass the message to her.  I included you on this message stream between my nurse and the patient's hematologist.   -  - Wilfrid Lund, MD    [facility]

## 2020-05-24 NOTE — Telephone Encounter (Signed)
Attempted to contact patient, called and no vm is set up, unbable to leave message.

## 2020-05-24 NOTE — Telephone Encounter (Signed)
Spoke with patient regarding capsule results, pt scheduled follow up for hiatal hernia on 07/13/20 at 3:40 PM with Dr. [name].

## 2020-06-01 ENCOUNTER — Telehealth: Payer: Self-pay | Admitting: *Deleted

## 2020-06-01 ENCOUNTER — Inpatient Hospital Stay: Payer: BC Managed Care – PPO

## 2020-06-01 ENCOUNTER — Inpatient Hospital Stay: Payer: BC Managed Care – PPO | Admitting: Internal Medicine

## 2020-06-01 ENCOUNTER — Telehealth: Payer: Self-pay | Admitting: Internal Medicine

## 2020-06-01 NOTE — Telephone Encounter (Signed)
Md made aware

## 2020-06-01 NOTE — Assessment & Plan Note (Deleted)
#   Severe iron deficient anemia-question etiology- on IV venofer- Hb  8.3 proceed with venofer today.    # Iron deficiency secondary to possible blood loss [FOBT positive]; May 2021 CT abdomen pelvis- question secondary to large hiatal hernia; awaiting- capsule study- planned on June 7th [ Dr.Danis]. awaiting weight loss/bariatric clinic on June 3rd.  Discussed that the treatment for hiatal hernia is usually surgical; patient symptomatic with reflux.  Will defer to GI/surgical evaluation.   # DISPOSITION:  #  venofer today # follow up as planned on June 22nd-MD;cbc/possible venofer-Dr.B

## 2020-06-01 NOTE — Progress Notes (Deleted)
[facility] NOTE  Patient Care Team: Kerin Perna, NP as PCP - General (Internal Medicine) Cammie Sickle, MD as Consulting Physician (Internal Medicine)  CHIEF COMPLAINTS/PURPOSE OF CONSULTATION: severe anemia  HEMATOLOGY HISTORY  # IRON DEFICIENCY ANEMIA [since 2015; NOV 2020 nadir Hb- 3.6-s/p multiple PRBC transfusions] NOV 2020-EGD/colonoscopy- unremarkable [Dr.Danis; [facility]]; no menorrhagia. PNH-Normal; NO Hemolysis.  May 2021-CT scan large hiatal hernia-question reason for blood loss; awaiting capsule study.  # Migraines/ PTSD  HISTORY OF PRESENTING ILLNESS:  [name] 49 y.o.  female severe iron deficiency of unclear etiology is here for follow-up.  Patient in interim was evaluated in the emergency room after noted to have a hemoglobin of 6.8.  Patient is CT scan of the abdomen pelvis that showed large hiatal hernia; otherwise no other acute etiology was noted.  Patient complains of reflux-like symptoms.   Review of Systems  Constitutional: Positive for malaise/fatigue. Negative for chills, diaphoresis, fever and weight loss.  HENT: Negative for nosebleeds and sore throat.   Eyes: Negative for double vision.  Respiratory: Negative for cough, hemoptysis, sputum production and wheezing.   Cardiovascular: Negative for chest pain, palpitations, orthopnea and leg swelling.  Gastrointestinal: Positive for heartburn. Negative for abdominal pain, blood in stool, constipation, diarrhea, melena and vomiting.  Genitourinary: Negative for dysuria, frequency and urgency.  Musculoskeletal: Positive for joint pain. Negative for back pain.  Skin: Negative.  Negative for itching and rash.  Neurological: Negative for tingling, focal weakness, weakness and headaches.  Endo/Heme/Allergies: Does not bruise/bleed easily.  Psychiatric/Behavioral: Negative for depression. The patient is not nervous/anxious and does not have insomnia.    MEDICAL HISTORY:   Past Medical History:  Diagnosis Date  . Amenia 11/2018  . Blood transfusion without reported diagnosis   . Migraine   . PTSD (post-traumatic stress disorder)    reports daughter was murdered   . Tachycardia   . Thyroid disease   . Vitamin D deficiency     SURGICAL HISTORY: Past Surgical History:  Procedure Laterality Date  . BIOPSY  10/17/2019   Procedure: BIOPSY;  Surgeon: Doran Stabler, MD;  Location: [facility];  Service: Gastroenterology;;  . COLONOSCOPY WITH PROPOFOL N/A 10/17/2019   Procedure: COLONOSCOPY WITH PROPOFOL;  Surgeon: Doran Stabler, MD;  Location: [facility];  Service: Gastroenterology;  Laterality: N/A;  . ESOPHAGOGASTRODUODENOSCOPY (EGD) WITH PROPOFOL N/A 10/17/2019   Procedure: ESOPHAGOGASTRODUODENOSCOPY (EGD) WITH PROPOFOL;  Surgeon: Doran Stabler, MD;  Location: [facility];  Service: Gastroenterology;  Laterality: N/A;  . TUBAL LIGATION      SOCIAL HISTORY: Social History   Socioeconomic History  . Marital status: Single    Spouse name: Not on file  . Number of children: Not on file  . Years of education: Not on file  . Highest education level: Not on file  Occupational History  . Not on file  Tobacco Use  . Smoking status: Never Smoker  . Smokeless tobacco: Never Used  Vaping Use  . Vaping Use: Never used  Substance and Sexual Activity  . Alcohol use: No  . Drug use: No  . Sexual activity: Not Currently    Birth control/protection: Surgical  Other Topics Concern  . Not on file  Social History Narrative   Pt relocated to [place] from [place] around 2012.      Lives in [place]; with significant other. 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    Social Determinants of Health   Financial  Resource Strain:   . Difficulty of Paying Living Expenses:   Food Insecurity:   . Worried About Charity fundraiser in the Last Year:   . Arboriculturist in the Last Year:   Transportation Needs:   . Lexicographer (Medical):   Marland Kitchen Lack of Transportation (Non-Medical):   Physical Activity:   . Days of Exercise per Week:   . Minutes of Exercise per Session:   Stress:   . Feeling of Stress :   Social Connections:   . Frequency of Communication with Friends and Family:   . Frequency of Social Gatherings with Friends and Family:   . Attends Religious Services:   . Active Member of Clubs or Organizations:   . Attends Archivist Meetings:   Marland Kitchen Marital Status:   Intimate Partner Violence:   . Fear of Current or Ex-Partner:   . Emotionally Abused:   Marland Kitchen Physically Abused:   . Sexually Abused:     FAMILY HISTORY: Family History  Problem Relation Age of Onset  . Hypertension Mother   . Arthritis Mother   . Hypertension Son   . Cancer Maternal Aunt   . Diabetes Maternal Grandmother     ALLERGIES:  is allergic to bee venom.  MEDICATIONS:  Current Outpatient Medications  Medication Sig Dispense Refill  . diphenhydrAMINE (BENADRYL) 25 mg capsule Take 25-50 mg by mouth as needed (for allergic reactions).     No current facility-administered medications for this visit.    PHYSICAL EXAMINATION:   There were no vitals filed for this visit. There were no vitals filed for this visit.  Physical Exam HENT:     Head: Normocephalic and atraumatic.     Mouth/Throat:     Pharynx: No oropharyngeal exudate.  Eyes:     Pupils: Pupils are equal, round, and reactive to light.  Cardiovascular:     Rate and Rhythm: Normal rate and regular rhythm.  Pulmonary:     Effort: Pulmonary effort is normal. No respiratory distress.     Breath sounds: Normal breath sounds. No wheezing.  Abdominal:     General: Bowel sounds are normal. There is no distension.     Palpations: Abdomen is soft. There is no mass.     Tenderness: There is no abdominal tenderness. There is no guarding or rebound.  Musculoskeletal:        General: No tenderness. Normal range of motion.     Cervical back: Normal  range of motion and neck supple.  Skin:    General: Skin is warm.  Neurological:     Mental Status: She is alert and oriented to person, place, and time.  Psychiatric:        Mood and Affect: Affect normal.     LABORATORY DATA:  I have reviewed the data as listed Lab Results  Component Value Date   WBC 9.4 04/22/2020   HGB 8.3 (L) 04/22/2020   HCT 27.5 (L) 04/22/2020   MCV 77.2 (L) 04/22/2020   PLT 613 (H) 04/22/2020   Recent Labs    10/16/19 0250 10/16/19 0250 03/31/20 1049 04/01/20 0647 04/22/20 0826  NA 139   < > 135 133* 137  K 4.0   < > 4.7 3.8 4.3  CL 108   < > 103 103 103  CO2 23   < > 23 23 27   GLUCOSE 102*   < > 84 109* 99  BUN 14   < > 16 10 16  CREATININE 0.91   < > 0.77 0.71 0.88  CALCIUM 8.6*   < > 8.9 8.4* 8.4*  GFRNONAA >60   < > >60 >60 >60  GFRAA >60   < > >60 >60 >60  PROT 7.3  --   --  7.6  --   ALBUMIN 3.1*  --   --  3.2*  --   AST 12*  --   --  14*  --   ALT 9  --   --  17  --   ALKPHOS 70  --   --  66  --   BILITOT <0.1*  --   --  0.9  --    < > = values in this interval not displayed.     No results found.  No problem-specific Assessment & Plan notes found for this encounter.  All questions were answered. The patient knows to call the clinic with any problems, questions or concerns.    Cammie Sickle, MD 06/01/2020 8:29 AM

## 2020-06-01 NOTE — Telephone Encounter (Signed)
Patient called scheduler to cancel appointment stating that she does not have transportation and that she will probably be going to the hospital due to being in such pain. I called patient and got voice mail this morning. I called again and spoke with patient who states that she is calling ambulance to take her to the hospital because her head is hurting very bad. She does have a history of migraines

## 2020-06-01 NOTE — Telephone Encounter (Signed)
Patient phoned on this date and left voicemail stating that she would not be able to attend her appt today due to pain and may instead go to the hospital. Message was forwarded to triage RN. Triage RN stated that she spoke with patient and asked writer to cancel appts for 06-01-20 and that patient would phone back at a later time to reschedule.

## 2020-07-13 ENCOUNTER — Other Ambulatory Visit (INDEPENDENT_AMBULATORY_CARE_PROVIDER_SITE_OTHER): Payer: Medicare HMO

## 2020-07-13 ENCOUNTER — Encounter: Payer: Self-pay | Admitting: Gastroenterology

## 2020-07-13 ENCOUNTER — Ambulatory Visit (INDEPENDENT_AMBULATORY_CARE_PROVIDER_SITE_OTHER): Payer: Medicare HMO | Admitting: Gastroenterology

## 2020-07-13 ENCOUNTER — Telehealth: Payer: Self-pay | Admitting: Gastroenterology

## 2020-07-13 VITALS — BP 116/68 | HR 70 | Ht 67.0 in | Wt 270.0 lb

## 2020-07-13 DIAGNOSIS — R1013 Epigastric pain: Secondary | ICD-10-CM

## 2020-07-13 DIAGNOSIS — D509 Iron deficiency anemia, unspecified: Secondary | ICD-10-CM | POA: Diagnosis not present

## 2020-07-13 DIAGNOSIS — K449 Diaphragmatic hernia without obstruction or gangrene: Secondary | ICD-10-CM | POA: Diagnosis not present

## 2020-07-13 LAB — CBC WITH DIFFERENTIAL/PLATELET
Basophils Absolute: 0.1 10*3/uL (ref 0.0–0.1)
Basophils Relative: 0.5 % (ref 0.0–3.0)
Eosinophils Absolute: 0.2 10*3/uL (ref 0.0–0.7)
Eosinophils Relative: 1.5 % (ref 0.0–5.0)
HCT: 20.2 % — CL (ref 36.0–46.0)
Hemoglobin: 5.9 g/dL — CL (ref 12.0–15.0)
Lymphocytes Relative: 17.4 % (ref 12.0–46.0)
Lymphs Abs: 1.8 10*3/uL (ref 0.7–4.0)
MCHC: 29.3 g/dL — ABNORMAL LOW (ref 30.0–36.0)
MCV: 60.6 fl — ABNORMAL LOW (ref 78.0–100.0)
Monocytes Absolute: 0.6 10*3/uL (ref 0.1–1.0)
Monocytes Relative: 6.1 % (ref 3.0–12.0)
Neutro Abs: 7.7 10*3/uL (ref 1.4–7.7)
Neutrophils Relative %: 74.5 % (ref 43.0–77.0)
Platelets: 742 10*3/uL — ABNORMAL HIGH (ref 150.0–400.0)
RBC: 3.33 Mil/uL — ABNORMAL LOW (ref 3.87–5.11)
RDW: 22.1 % — ABNORMAL HIGH (ref 11.5–15.5)
WBC: 10.3 10*3/uL (ref 4.0–10.5)

## 2020-07-13 NOTE — Patient Instructions (Signed)
If you are age 50 or older, your body mass index should be between 23-30. Your Body mass index is 42.29 kg/m. If this is out of the aforementioned range listed, please consider follow up with your Primary Care Provider.  If you are age 49 or younger, your body mass index should be between 19-25. Your Body mass index is 42.29 kg/m. If this is out of the aformentioned range listed, please consider follow up with your Primary Care Provider.   Your provider has requested that you go to the basement level for lab work before leaving today. Press "B" on the elevator. The lab is located at the first door on the left as you exit the elevator.  Due to recent changes in healthcare laws, you may see the results of your imaging and laboratory studies on MyChart before your provider has had a chance to review them.  We understand that in some cases there may be results that are confusing or concerning to you. Not all laboratory results come back in the same time frame and the provider may be waiting for multiple results in order to interpret others.  Please give [place] 48 hours in order for your provider to thoroughly review all the results before contacting the office for clarification of your results.   It was a pleasure to see you today!  Dr. [name]

## 2020-07-13 NOTE — Telephone Encounter (Signed)
[facility] MD: Dr. [name]  Successfully spoke with Barbara Cower re: hemoglobin 5.9 on today's labs. She will go to the ED for PRBC transfusion.

## 2020-07-13 NOTE — Progress Notes (Addendum)
[facility] Progress Note  Chief Complaint: Epigastric/chest pain  Subjective  History: Seen in hospital consultation November 2020 for years of transfusion dependent anemia with no prior GI or hematology evaluation.  Admission hemoglobin was 3.5 with MCV 71, transfused several units PRBCs. Inpatient EGD with paraesophageal hiatal hernia, no erosions or other sources of blood loss seen.  Colonoscopy with fair preparation (patient did not consume all the bowel preparation) was a complete exam to the terminal ileum, no source of blood loss found.  Recommendations were repeat exam for screening in a year. Patient was seen by hematology outpatient and treated with periodic IV iron. Hematology contacted me and requested small bowel video capsule study due to ongoing IDA, this was done in early June of this year and normal. ______________________ Christina Mcbride return to see me for a couple of issues.  She has mainly been bothered by daily epigastric and lower sternal nonexertional discomfort that hurts worse after eating and leads to regurgitation and heartburn especially at night.  She has to sleep sitting up due to the pain and reflux symptoms.  When she has severe reflux episodes it also makes her feel short of breath.  She denies dysphagia or odynophagia but can only tolerate small portions of food and wonders why she is not losing weight.  In addition, she believes that her anemia has worsened again.  She feels short of breath, sometimes lightheaded while standing with racing heart.  Her partner drove her today because she has been feeling unwell for several weeks with the symptoms.  She was getting regular IV iron treatments with hematology, but could not make the June appointment with them because she had Covid. No melena or hematochezia.  ROS: Cardiovascular:  no chest pain Respiratory: no dyspnea Remainder of systems negative except as above The patient's Past Medical, Family and Social  History were reviewed and are on file in the EMR.  Objective:  Med list reviewed  Current Outpatient Medications:    diphenhydrAMINE (BENADRYL) 25 mg capsule, Take 25-50 mg by mouth as needed (for allergic reactions)., Disp: , Rfl:    Vital signs in last 24 hrs: Vitals:   07/13/20 1533  BP: 116/68  Pulse: 70    Physical Exam  No acute distress.  Getting on exam table causes elevation of heart rate to 110 with dyspnea, pulse then decreases to 90s after a few minutes of sitting on exam table.  HEENT: sclera anicteric but there is conjunctival pallor, oral mucosa moist without lesions  Neck: supple, no thyromegaly, JVD or lymphadenopathy  Cardiac: RRR without murmurs, S1S2 heard, no peripheral edema  Pulm: clear to auscultation bilaterally, normal RR and effort noted  Abdomen: soft, epigastric tenderness, with active bowel sounds. No guarding or palpable hepatosplenomegaly.  Skin; warm and dry, no jaundice or rash  Labs:  CBC Latest Ref Rng & Units 07/13/2020 04/22/2020 04/01/2020  WBC 4.0 - 10.5 K/uL 10.3 9.4 11.7(H)  Hemoglobin 12.0 - 15.0 g/dL 5.9 Repeated and verified X2.(LL) 8.3(L) 9.3(L)  Hematocrit 36 - 46 % 20.2 Repeated and verified X2.(LL) 27.5(L) 29.4(L)  Platelets 150 - 400 K/uL 742.0(H) 613(H) 636(H)   Last iron treatment May 27th  ___________________________________________ Radiologic studies:   ____________________________________________ Other:   _____________________________________________ Assessment & Plan  Assessment: Encounter Diagnoses  Name Primary?   Epigastric pain Yes   Iron deficiency anemia, unspecified iron deficiency anemia type    Hiatal hernia    Chronic iron deficiency anemia of unclear cause.  No  source of blood loss on EGD colonoscopy or video capsule study.  It is possible there was a source in the GI tract that could not be identified on the studies.  Nevertheless, no intervention can be taken without a definitive lesion,  so she needs close follow-up with hematology for regular dosing of IV iron.  With the symptoms today I sent her for stat CBC, results of which came back after she left the office.  Upon receiving the results I attempted to reach her on multiple numbers multiple times.  She did not answer her mobile number and the voicemail box was not set up.  She did not answer the home number and no voicemail was set up. The number listed as her significant other was reached, but that individual said we had reached the wrong number.  Therefore, that contact information in the EMR appears to be incorrect.   Plan: I spoke with our on-call physician this evening and asked them to try this patient later this evening.  If she can be reached, she will be directed to the emergency department for critically low hemoglobin level requiring transfusion.  After her acute on chronic anemia is attended to, my office will make plans for an outpatient CT scan chest and abdomen with IV contrast to further evaluate the hiatal hernia and look for any other sources of this lower chest/epigastric discomfort prior to a surgical referral for hernia repair.   40 minutes were spent on this encounter (including chart review, history/exam, counseling/coordination of care, and documentation) as noted above, multiple attempts were made to reach this patient by phone after critical lab results reviewed.  [name]   At 5:12 pm I reached someone on the number listed as home number for this patient, but it is not this patient and they said "wrong number".  So this number is also incorrect.  - H.Danis

## 2020-07-14 ENCOUNTER — Telehealth: Payer: Self-pay

## 2020-07-14 ENCOUNTER — Telehealth: Payer: Self-pay | Admitting: Internal Medicine

## 2020-07-14 DIAGNOSIS — D500 Iron deficiency anemia secondary to blood loss (chronic): Secondary | ICD-10-CM

## 2020-07-14 DIAGNOSIS — D649 Anemia, unspecified: Secondary | ICD-10-CM

## 2020-07-14 NOTE — Telephone Encounter (Signed)
Vaughan Basta,    I had sent you this chart from yesterday's office visit.  Patient with critically low hemoglobin, was eventually reached by overnight doc and instructed to go to ED.  Looking at [facility], I do not see that she went to [facility] or [facility]. Please try her mobile number this morning and find out what you can.  - HD

## 2020-07-14 NOTE — Addendum Note (Signed)
Addended by: Gloris Ham on: 07/14/2020 04:37 PM   Modules accepted: Orders

## 2020-07-14 NOTE — Telephone Encounter (Signed)
See additional phone note. 

## 2020-07-14 NOTE — Telephone Encounter (Signed)
Spoke with pt and she states she is going to go to the ER this evening. Reports she had a family emergency last evening and could not go but will go today. Dr. [name] aware.

## 2020-07-14 NOTE — Telephone Encounter (Signed)
Pt noted have Hb Hb 5.5 with GI- referred to go ER.  Heather/taylor- AFTER ER evaluation today- please schedule appt [later this week]- MD; labs-cbc/ hold tube; venofer- Dr.B  FYI-Dr.Danis

## 2020-07-15 ENCOUNTER — Other Ambulatory Visit: Payer: Self-pay

## 2020-07-15 ENCOUNTER — Encounter (HOSPITAL_COMMUNITY): Payer: Self-pay | Admitting: Emergency Medicine

## 2020-07-15 ENCOUNTER — Telehealth: Payer: Self-pay | Admitting: *Deleted

## 2020-07-15 ENCOUNTER — Observation Stay (HOSPITAL_COMMUNITY)
Admission: EM | Admit: 2020-07-15 | Discharge: 2020-07-21 | Disposition: A | Discharge status: Home / Self Care | Payer: Medicare HMO | Attending: Internal Medicine | Admitting: Internal Medicine

## 2020-07-15 DIAGNOSIS — R195 Other fecal abnormalities: Secondary | ICD-10-CM

## 2020-07-15 DIAGNOSIS — D72829 Elevated white blood cell count, unspecified: Secondary | ICD-10-CM | POA: Insufficient documentation

## 2020-07-15 DIAGNOSIS — K219 Gastro-esophageal reflux disease without esophagitis: Secondary | ICD-10-CM | POA: Diagnosis present

## 2020-07-15 DIAGNOSIS — K449 Diaphragmatic hernia without obstruction or gangrene: Secondary | ICD-10-CM

## 2020-07-15 DIAGNOSIS — R002 Palpitations: Secondary | ICD-10-CM | POA: Insufficient documentation

## 2020-07-15 DIAGNOSIS — R0602 Shortness of breath: Secondary | ICD-10-CM | POA: Insufficient documentation

## 2020-07-15 DIAGNOSIS — E66813 Obesity, class 3: Secondary | ICD-10-CM

## 2020-07-15 DIAGNOSIS — D62 Acute posthemorrhagic anemia: Secondary | ICD-10-CM | POA: Diagnosis present

## 2020-07-15 DIAGNOSIS — Z20822 Contact with and (suspected) exposure to covid-19: Secondary | ICD-10-CM | POA: Insufficient documentation

## 2020-07-15 DIAGNOSIS — D500 Iron deficiency anemia secondary to blood loss (chronic): Secondary | ICD-10-CM | POA: Diagnosis present

## 2020-07-15 DIAGNOSIS — F4310 Post-traumatic stress disorder, unspecified: Secondary | ICD-10-CM | POA: Diagnosis present

## 2020-07-15 DIAGNOSIS — D509 Iron deficiency anemia, unspecified: Secondary | ICD-10-CM | POA: Diagnosis not present

## 2020-07-15 DIAGNOSIS — D649 Anemia, unspecified: Principal | ICD-10-CM | POA: Diagnosis present

## 2020-07-15 DIAGNOSIS — G43909 Migraine, unspecified, not intractable, without status migrainosus: Secondary | ICD-10-CM | POA: Diagnosis present

## 2020-07-15 DIAGNOSIS — U071 COVID-19: Secondary | ICD-10-CM

## 2020-07-15 DIAGNOSIS — R109 Unspecified abdominal pain: Secondary | ICD-10-CM

## 2020-07-15 DIAGNOSIS — R42 Dizziness and giddiness: Secondary | ICD-10-CM | POA: Diagnosis present

## 2020-07-15 DIAGNOSIS — Z9889 Other specified postprocedural states: Secondary | ICD-10-CM

## 2020-07-15 LAB — IRON AND TIBC
Iron: 18 ug/dL — ABNORMAL LOW (ref 28–170)
Saturation Ratios: 4 % — ABNORMAL LOW (ref 10.4–31.8)
TIBC: 410 ug/dL (ref 250–450)
UIBC: 392 ug/dL

## 2020-07-15 LAB — CBC
HCT: 22.6 % — ABNORMAL LOW (ref 36.0–46.0)
Hemoglobin: 5.9 g/dL — CL (ref 12.0–15.0)
MCH: 17.4 pg — ABNORMAL LOW (ref 26.0–34.0)
MCHC: 26.1 g/dL — ABNORMAL LOW (ref 30.0–36.0)
MCV: 66.5 fL — ABNORMAL LOW (ref 80.0–100.0)
Platelets: 638 10*3/uL — ABNORMAL HIGH (ref 150–400)
RBC: 3.4 MIL/uL — ABNORMAL LOW (ref 3.87–5.11)
RDW: 21 % — ABNORMAL HIGH (ref 11.5–15.5)
WBC: 9.7 10*3/uL (ref 4.0–10.5)
nRBC: 0.7 % — ABNORMAL HIGH (ref 0.0–0.2)

## 2020-07-15 LAB — SARS CORONAVIRUS 2 BY RT PCR (HOSPITAL ORDER, PERFORMED IN ~~LOC~~ HOSPITAL LAB): SARS Coronavirus 2: NEGATIVE

## 2020-07-15 LAB — COMPREHENSIVE METABOLIC PANEL
ALT: 9 U/L (ref 0–44)
AST: 12 U/L — ABNORMAL LOW (ref 15–41)
Albumin: 3.6 g/dL (ref 3.5–5.0)
Alkaline Phosphatase: 77 U/L (ref 38–126)
Anion gap: 11 (ref 5–15)
BUN: 20 mg/dL (ref 6–20)
CO2: 23 mmol/L (ref 22–32)
Calcium: 9 mg/dL (ref 8.9–10.3)
Chloride: 103 mmol/L (ref 98–111)
Creatinine, Ser: 0.84 mg/dL (ref 0.44–1.00)
GFR calc Af Amer: >60 mL/min (ref >60)
GFR calc non Af Amer: >60 mL/min (ref >60)
Glucose, Bld: 112 mg/dL — ABNORMAL HIGH (ref 70–99)
Potassium: 4.3 mmol/L (ref 3.5–5.1)
Sodium: 137 mmol/L (ref 135–145)
Total Bilirubin: 0.4 mg/dL (ref 0.3–1.2)
Total Protein: 8.1 g/dL (ref 6.5–8.1)

## 2020-07-15 LAB — FERRITIN: Ferritin: 11 ng/mL (ref 11–307)

## 2020-07-15 LAB — PREPARE RBC (CROSSMATCH)

## 2020-07-15 LAB — POC OCCULT BLOOD, ED: Fecal Occult Bld: POSITIVE — AB

## 2020-07-15 MED ORDER — ACETAMINOPHEN 325 MG PO TABS
650.0000 mg | ORAL_TABLET | Freq: Four times a day (QID) | ORAL | Status: DC | PRN
  Administered 2020-07-17 – 2020-07-21 (×3): 650 mg via ORAL
  Filled 2020-07-15 (×4): qty 2

## 2020-07-15 MED ORDER — ACETAMINOPHEN 650 MG RE SUPP: 650.0000 mg | Freq: Four times a day (QID) | RECTAL | Status: DC | PRN

## 2020-07-15 MED ORDER — FOLIC ACID 1 MG PO TABS
1.0000 mg | ORAL_TABLET | Freq: Every day | ORAL | Status: DC
  Administered 2020-07-17 – 2020-07-21 (×4): 1 mg via ORAL
  Filled 2020-07-15 (×5): qty 1

## 2020-07-15 MED ORDER — ONDANSETRON HCL 4 MG PO TABS: 4.0000 mg | ORAL_TABLET | Freq: Four times a day (QID) | ORAL | Status: DC | PRN

## 2020-07-15 MED ORDER — ONDANSETRON HCL 4 MG/2ML IJ SOLN: 4.0000 mg | Freq: Four times a day (QID) | INTRAMUSCULAR | Status: DC | PRN

## 2020-07-15 MED ORDER — SODIUM CHLORIDE 0.9 % IV SOLN: 10.0000 mL/h | Freq: Once | INTRAVENOUS | Status: DC

## 2020-07-15 MED ORDER — SODIUM CHLORIDE 0.9% FLUSH
3.0000 mL | Freq: Two times a day (BID) | INTRAVENOUS | Status: DC
  Administered 2020-07-15 – 2020-07-21 (×11): 3 mL via INTRAVENOUS

## 2020-07-15 NOTE — H&P (Signed)
History and Physical   Christina Mcbride BJS:[id number] DOB: July 01, 1971 DOA: 07/15/2020  Referring MD/NP/PA: Dr. Zenia Resides, Rosa Sanchez PCP: Kerin Perna, NP Outpatient Specialists: GI, Dr. [name]; hematology, Dr. Rogue Bussing  Patient coming from: Home  Chief Complaint: Lightheadedness, exertional dyspnea  HPI: Christina Mcbride is a 49 y.o. female with a history of iron-deficiency anemia and migraines who presented to the ED with lightheadedness and shortness of breath with exertion that has been constant and worsening since June 2021, now severe, consistent with prior episodes where she required transfusions. She has been getting iron infusions regularly, but none since May 2021 due to transportation issues and having covid-19. She denies bleeding of any kind. She was evaluated by GI in late 2020 with EGD, colonoscopy, capsule endoscopy without bleeding source found (hiatal hernia noted). She presented there on 5/17 where CBC was consistent with severe microcytic anemia for which she was referred to the ED for blood transfusion.  ED Course: HR documented as 108bpm, BP 149/93. HR has come down without intervention. CBC confirms hgb 5.9 as noted at GI visit on 5/17. 2u PRBCs ordered and observation requested for symptomatic anemia.   Review of Systems: +palpitations without angina. No dyspnea at rest. LMP was years ago. No abnormal bleeding, bruising, hemarthrosis, bleeding gums, hematemesis, N/V/D. Denies fever, chills, weight loss, changes in vision or hearing, cough, sore throat, chest pain, abdominal pain, changes in bowel habits, blood in stool, change in bladder habits, myalgias, arthralgias, rash, and per HPI. All others reviewed and are negative.   PMH: Got covid earlier this year, has been fully vaccinated with moderna x2 doses.  Past Medical History:  Diagnosis Date  . Amenia 11/2018  . Blood transfusion without reported diagnosis   . Migraine   . PTSD (post-traumatic stress disorder)    reports  daughter was murdered   . Tachycardia   . Thyroid disease   . Vitamin D deficiency    Past Surgical History:  Procedure Laterality Date  . BIOPSY  10/17/2019   Procedure: BIOPSY;  Surgeon: Doran Stabler, MD;  Location: [facility];  Service: Gastroenterology;;  . COLONOSCOPY WITH PROPOFOL N/A 10/17/2019   Procedure: COLONOSCOPY WITH PROPOFOL;  Surgeon: Doran Stabler, MD;  Location: [facility];  Service: Gastroenterology;  Laterality: N/A;  . ESOPHAGOGASTRODUODENOSCOPY (EGD) WITH PROPOFOL N/A 10/17/2019   Procedure: ESOPHAGOGASTRODUODENOSCOPY (EGD) WITH PROPOFOL;  Surgeon: Doran Stabler, MD;  Location: [facility];  Service: Gastroenterology;  Laterality: N/A;  . 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   reports that she has never smoked. She has never used smokeless tobacco. She reports that she does not drink alcohol and does not use drugs. Allergies  Allergen Reactions  . Bee Venom Anaphylaxis   Family History  Problem Relation Age of Onset  . Hypertension Mother   . Arthritis Mother   . Hypertension Son   . Cancer Maternal Aunt   . Diabetes Maternal Grandmother    - Family history otherwise reviewed and not pertinent. Specifically denies personal or family history of bleeding disorders, thalassemia, sickle cell.  Prior to Admission medications   Medication Sig Start Date End Date Taking? Authorizing Provider  folic acid (FOLVITE) 1 MG tablet Take 1 mg by mouth daily.   Yes [provider]  Multiple Vitamin (MULTIVITAMIN ADULT) TABS Take 2 tablets by mouth daily.   Yes [provider]    Physical Exam: Vitals:   07/15/20  1053 07/15/20 1207 07/15/20 1450 07/15/20 1610  BP: 136/83 (!) 155/80 134/82 134/90  Pulse: 99 91 89 80  Resp: 18 20 20 16   Temp:   98.9 F (37.2 C)   TempSrc:   Oral   SpO2: 100% 100% 100% 100%  Weight:      Height:       Constitutional: 49 y.o. female in no  distress, calm demeanor Eyes: Lids and conjunctivae normal, PERRL ENMT: Mucous membranes are moist. Posterior pharynx clear of any exudate or lesions.  Neck: normal, supple, no masses, no thyromegaly Respiratory: Non-labored breathing room air without accessory muscle use. Clear breath sounds to auscultation bilaterally Cardiovascular: Regular rate and rhythm, systolic flow murmur across base, no other murmurs, rubs, or gallops. No carotid bruits. No JVD. Trace LE edema. + pedal pulses. Abdomen: Normoactive bowel sounds. No tenderness, non-distended, and no masses palpated. No hepatosplenomegaly. GU: No indwelling catheter Musculoskeletal: No clubbing / cyanosis. No joint deformity upper and lower extremities. Good ROM, no contractures. Normal muscle tone.  Skin: Warm, dry. No rashes, wounds, or ulcers. No significant lesions noted.  Neurologic: CN II-XII grossly intact. Speech normal. No focal deficits in motor strength or sensation in all extremities.  Psychiatric: Alert and oriented x3. Normal judgment and insight. Mood euthymic with congruent affect.   CBC: Recent Labs  Lab 07/13/20 1623 07/15/20 1149  WBC 10.3 9.7  NEUTROABS 7.7  --   HGB 5.9 Repeated and verified X2.* 5.9*  HCT 20.2 Repeated and verified X2.* 22.6*  MCV 60.6 Repeated and verified X2.* 66.5*  PLT 742.0* 638*    Assessment/Plan Active Problems:   Symptomatic anemia   Iron deficiency anemia   Symptomatic iron deficiency anemia due to chronic blood loss of unidentified origin: Do not suspect there is acute blood loss based on history of symptoms gradually worsening since June in setting of not getting iron transfusions, as well as her hemodynamic stability (not tachycardic, actually hypertensive). +FOBT is consistent with suspected cryptogenic GI bleeding, though not suspected to be acute.  - Transfuse 2u PRBCs, monitor CBC in AM.  - D/w [facility], Georgina Peer, who does not believe any further work up as an  inpatient is likely unless the patient develops gross bleeding or hgb does not rebound as anticipated. Will give diet.  - Will need arrangements for iron infusions and post-discharge CBC. Hopefully the patient can be referred for iron infusions here in [place] closer to where she lives instead of [facility]. - Orthostatic vital signs in AM.  - Will DC on oral iron which the patient states she's been taking.  DVT prophylaxis: SCDs  Code Status: Full  Family Communication: None at bedside Disposition Plan: Return home if symptoms improved with transfusions Consults called: [facility], Georgina Peer who will also discuss with Dr. Hilarie Fredrickson. Will need to touch base tomorrow AM if formal consult requested.  Admission status: Observation    Patrecia Pour, MD [facility] [email or web address] 07/15/2020, 4:16 PM

## 2020-07-15 NOTE — ED Provider Notes (Signed)
[facility] DEPT Provider Note   CSN: [id number] Arrival date & time: 07/15/20  3474     History Chief Complaint  Patient presents with  . Anemia    Christina Mcbride is a 49 y.o. female has no history of anemia, migraine, PTSD, thyroid disease who presents for evaluation of anemia.  Patient reports she has had an ongoing history of anemia and is followed by Dr. [name] ([facility]).  She had an outpatient appointment with him a few days ago for evaluation and was called and told that her hemoglobin was 5 and to go to the emergency department.  Patient reports that she had had some symptoms at home that were consistent with her anemia.  She states that whenever she would get up and walk around, she would get very tachycardic and feel lightheaded.  She also reported shortness of breath with exertion. She also reports feeling very run down and fatigued. She states that the tachycardia and shortness of breath was side whenever she would stop and rest.  She states that she had been getting iron infusions about every 2 weeks but her last one was in May.  She had gotten Covid and had transportation issues and was not able to get to the facility to get iron transfusions.  She states her last blood transfusion was in April.  She has not had any rectal bleeding, vaginal bleeding, gum bleeding.  She is not on blood thinners.  She denies any recent sickness, fevers, cough, abdominal pain, CP.   The history is provided by the patient.       Past Medical History:  Diagnosis Date  . Amenia 11/2018  . Blood transfusion without reported diagnosis   . Migraine   . PTSD (post-traumatic stress disorder)    reports daughter was murdered   . Tachycardia   . Thyroid disease   . Vitamin D deficiency     Patient Active Problem List   Diagnosis Date Noted  . Severe anemia 10/16/2019  . Orthostatic hypotension 11/29/2018  . Chronic daily headache 06/08/2014  . Gastroesophageal  reflux disease without esophagitis 06/08/2014  . Iron deficiency anemia due to chronic blood loss 03/06/2014  . Left shoulder pain 03/06/2014  . Anemia 12/27/2013  . Symptomatic anemia 12/27/2013  . Tachycardia 12/27/2013  . Bilateral leg edema 12/27/2013  . Goiter 12/27/2013  . Dizziness 12/27/2013  . Generalized weakness 12/27/2013  . Iron deficiency anemia 12/27/2013  . Low iron stores 12/27/2013  . UTI (urinary tract infection) 12/27/2013  . PTSD (post-traumatic stress disorder)   . Migraine     Past Surgical History:  Procedure Laterality Date  . BIOPSY  10/17/2019   Procedure: BIOPSY;  Surgeon: Doran Stabler, MD;  Location: [facility];  Service: Gastroenterology;;  . COLONOSCOPY WITH PROPOFOL N/A 10/17/2019   Procedure: COLONOSCOPY WITH PROPOFOL;  Surgeon: Doran Stabler, MD;  Location: [facility];  Service: Gastroenterology;  Laterality: N/A;  . ESOPHAGOGASTRODUODENOSCOPY (EGD) WITH PROPOFOL N/A 10/17/2019   Procedure: ESOPHAGOGASTRODUODENOSCOPY (EGD) WITH PROPOFOL;  Surgeon: Doran Stabler, MD;  Location: [facility];  Service: Gastroenterology;  Laterality: N/A;  . TUBAL LIGATION       OB History   No obstetric history on file.     Family History  Problem Relation Age of Onset  . Hypertension Mother   . Arthritis Mother   . Hypertension Son   . Cancer Maternal Aunt   . Diabetes Maternal Grandmother  Social History   Tobacco Use  . Smoking status: Never Smoker  . Smokeless tobacco: Never Used  Vaping Use  . Vaping Use: Never used  Substance Use Topics  . Alcohol use: No  . Drug use: No    Home Medications Prior to Admission medications   Medication Sig Start Date End Date Taking? Authorizing Provider  folic acid (FOLVITE) 1 MG tablet Take 1 mg by mouth daily.   Yes [provider]  Multiple Vitamin (MULTIVITAMIN ADULT) TABS Take 2 tablets by mouth daily.   Yes [provider]    Allergies    Bee  venom  Review of Systems   Review of Systems  Constitutional: Positive for fatigue. Negative for fever.  Respiratory: Positive for shortness of breath. Negative for cough.   Cardiovascular: Positive for palpitations. Negative for chest pain.  Gastrointestinal: Negative for abdominal pain, nausea and vomiting.  Genitourinary: Negative for dysuria and hematuria.  Neurological: Positive for light-headedness. Negative for headaches.  All other systems reviewed and are negative.   Physical Exam Updated Vital Signs BP 134/90   Pulse 80   Temp 98.9 F (37.2 C) (Oral)   Resp 16   Ht 5\' 7"  (1.702 m)   Wt 122.5 kg   LMP 05/04/2016   SpO2 100%   BMI 42.29 kg/m   Physical Exam Vitals and nursing note reviewed. Exam conducted with a chaperone present.  Constitutional:      Appearance: Normal appearance. She is well-developed.  HENT:     Head: Normocephalic and atraumatic.  Eyes:     General: Lids are normal.     Conjunctiva/sclera: Conjunctivae normal.     Pupils: Pupils are equal, round, and reactive to light.  Cardiovascular:     Rate and Rhythm: Normal rate and regular rhythm.     Pulses: Normal pulses.     Heart sounds: Normal heart sounds. No murmur heard.  No friction rub. No gallop.   Pulmonary:     Effort: Pulmonary effort is normal.     Breath sounds: Normal breath sounds.     Comments: Lungs clear to auscultation bilaterally.  Symmetric chest rise.  No wheezing, rales, rhonchi. Abdominal:     Palpations: Abdomen is soft. Abdomen is not rigid.     Tenderness: There is no abdominal tenderness. There is no guarding.     Comments: Abdomen is soft, non-distended, non-tender. No rigidity, No guarding. No peritoneal signs.  Genitourinary:    Comments: The exam was performed with a chaperone present. Normal external female genitalia. No lesions, rash, or sores. Small amount of dark stool noted on DRE. No BRBPR. No melena.  Musculoskeletal:        General: Normal range of  motion.     Cervical back: Full passive range of motion without pain.  Skin:    General: Skin is warm and dry.     Capillary Refill: Capillary refill takes less than 2 seconds.  Neurological:     Mental Status: She is alert and oriented to person, place, and time.  Psychiatric:        Speech: Speech normal.     ED Results / Procedures / Treatments   Labs (all labs ordered are listed, but only abnormal results are displayed) Labs Reviewed  COMPREHENSIVE METABOLIC PANEL - Abnormal; Notable for the following components:      Result Value   Glucose, Bld 112 (*)    AST 12 (*)    All other components within normal limits  CBC - Abnormal; Notable for the following components:   RBC 3.40 (*)    Hemoglobin 5.9 (*)    HCT 22.6 (*)    MCV 66.5 (*)    MCH 17.4 (*)    MCHC 26.1 (*)    RDW 21.0 (*)    Platelets 638 (*)    nRBC 0.7 (*)    All other components within normal limits  POC OCCULT BLOOD, ED - Abnormal; Notable for the following components:   Fecal Occult Bld POSITIVE (*)    All other components within normal limits  SARS CORONAVIRUS 2 BY RT PCR (HOSPITAL ORDER, [facility])  PROTIME-INR  IRON AND TIBC  FERRITIN  TYPE AND SCREEN  PREPARE RBC (CROSSMATCH)    EKG None  Radiology No results found.  Procedures .Critical Care Performed by: Volanda Napoleon, PA-C Authorized by: Volanda Napoleon, PA-C   Critical care provider statement:    Critical care time (minutes):  30   Critical care was necessary to treat or prevent imminent or life-threatening deterioration of the following conditions: symptomatic anemia.   Critical care was time spent personally by me on the following activities:  Discussions with consultants, evaluation of patient's response to treatment, examination of patient, ordering and performing treatments and interventions, ordering and review of laboratory studies, ordering and review of radiographic studies, pulse oximetry,  re-evaluation of patient's condition, obtaining history from patient or surrogate and review of old charts   (including critical care time)  Medications Ordered in ED Medications  0.9 %  sodium chloride infusion (has no administration in time range)  folic acid (FOLVITE) tablet 1 mg (has no administration in time range)  sodium chloride flush (NS) 0.9 % injection 3 mL (has no administration in time range)  acetaminophen (TYLENOL) tablet 650 mg (has no administration in time range)    Or  acetaminophen (TYLENOL) suppository 650 mg (has no administration in time range)  ondansetron (ZOFRAN) tablet 4 mg (has no administration in time range)    Or  ondansetron (ZOFRAN) injection 4 mg (has no administration in time range)    ED Course  I have reviewed the triage vital signs and the nursing notes.  Pertinent labs & imaging results that were available during my care of the patient were reviewed by me and considered in my medical decision making (see chart for details).    MDM Rules/Calculators/A&P                          49 year old female with possible history of anemia, thyroid issues who presents for evaluation of low hemoglobin.  Had routine blood checked a few days ago and was called today and told that her hemoglobin was low to go to the emergency department.  She reports that she has had history of iron transfusions but has not had one in the last 2 months.  No bleeding that she knows of.  On initially arrival, she is afebrile, nontoxic-appearing.  Vital signs are stable.  Digital rectal exam does show some dark stools but no evidence of bright red blood per rectum, melena.  Labs ordered at triage.  CBC shows no leukocytosis.  Hemoglobin is 5.9.  CMP shows normal BUN and creatinine.  Given low hemoglobin the patient is symptomatic, will plan for transfusion.  Review of her GI records show that [facility]  She had a colonoscopy that showed no source of  blood loss.   Additionally, she had a EGD/capsule study which was normal.  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  No erosions or other source of blood loss noted.  Given patient's hemoglobin, will plan for transfusion.  At this time, given that she has symptomatic anemia, will plan for admission. Discussed patient with Dr. Zenia Resides who is agreeable.   Discussed patient with Dr. Bonner Puna (hospitalist) who accepts patient for admission.   Portions of this note were generated with [employer]. Dictation errors may occur despite best attempts at proofreading.  Final Clinical Impression(s) / ED Diagnoses Final diagnoses:  Symptomatic anemia    Rx / DC Orders ED Discharge Orders    None       Desma Mcgregor 07/15/20 1641    Lacretia Leigh, MD 07/16/20 1324

## 2020-07-15 NOTE — Telephone Encounter (Signed)
Called patient to see if she received GI's message regarding going to the ER for a blood transfusion. Pt's voice was giddy and slurred on the phone.  Patient stated that she had been in the "ER for almost 24 hours and no one will help her and even get her back." patient stated, "I'm about to call the cab and have them take me home. I've got kids at home and I have to get home to help them." I asked her which ER that she went to she stated, "[facility] in [place]." I reiterated that her hgb was critically low at 5.9. I explained that she is at risk for syncope and other medical problems. She interrupted me and stated, "I know my risk factors and I know I may need to be admitted. No one will help me. I've already called the cab and I may just have them take me to [facility]." I explained to the patient that we are concerned that she needs to have treatment. I encouraged her to seek medical care needed. I also advised her to speak to the reception desk at [facility] ER. I explained that I do not see her actually registered in the ER. I asked her if she had a registration bracelet on her wrist. She interrupted and stated, "I"m just going to [facility]. I will have the cab take me there." I told the patient that I would inform her providers. Pt then hung up.  Note: No apts in cancer center made at this time for follow-up.

## 2020-07-15 NOTE — ED Notes (Signed)
CRITICAL VALUE ALERT  Critical Value:  Hemoglobin 5.9  Date & Time Notied:  07/15/2020 1226  Provider Notified: Zenia Resides, EDP  Orders Received/Actions taken:

## 2020-07-15 NOTE — Telephone Encounter (Addendum)
I reviewed the chart. Patient is now in [facility] ER.

## 2020-07-15 NOTE — ED Triage Notes (Signed)
Per EMS-history of anemia, gets iron transfusions every 2 weeks-elevated HR with ambulation, normal at rest-states she gets blood transfusions when iron supplement does not work

## 2020-07-15 NOTE — Plan of Care (Signed)
  Problem: Safety: Goal: Ability to remain free from injury will improve Outcome: Progressing   Problem: Pain Managment: Goal: General experience of comfort will improve Outcome: Progressing   Problem: Coping: Goal: Level of anxiety will decrease Outcome: Progressing   Problem: Activity: Goal: Risk for activity intolerance will decrease Outcome: Progressing   

## 2020-07-15 NOTE — Telephone Encounter (Signed)
Thank you for the note. My advice is still that she remain in the ED since hemoglobin is critically low.  ED and hospitals very busy now with COVID surge.

## 2020-07-15 NOTE — ED Triage Notes (Signed)
EDP informed of abnormal lab

## 2020-07-16 ENCOUNTER — Inpatient Hospital Stay (HOSPITAL_COMMUNITY): Payer: Medicare HMO

## 2020-07-16 ENCOUNTER — Encounter (HOSPITAL_COMMUNITY): Payer: Self-pay | Admitting: Family Medicine

## 2020-07-16 DIAGNOSIS — D649 Anemia, unspecified: Secondary | ICD-10-CM | POA: Diagnosis not present

## 2020-07-16 DIAGNOSIS — D500 Iron deficiency anemia secondary to blood loss (chronic): Secondary | ICD-10-CM | POA: Diagnosis not present

## 2020-07-16 DIAGNOSIS — K219 Gastro-esophageal reflux disease without esophagitis: Secondary | ICD-10-CM

## 2020-07-16 DIAGNOSIS — K449 Diaphragmatic hernia without obstruction or gangrene: Secondary | ICD-10-CM | POA: Diagnosis not present

## 2020-07-16 DIAGNOSIS — D509 Iron deficiency anemia, unspecified: Secondary | ICD-10-CM | POA: Diagnosis not present

## 2020-07-16 DIAGNOSIS — R195 Other fecal abnormalities: Secondary | ICD-10-CM

## 2020-07-16 DIAGNOSIS — D62 Acute posthemorrhagic anemia: Secondary | ICD-10-CM | POA: Diagnosis present

## 2020-07-16 LAB — CBC
HCT: 25.3 % — ABNORMAL LOW (ref 36.0–46.0)
Hemoglobin: 7.2 g/dL — ABNORMAL LOW (ref 12.0–15.0)
MCH: 19.9 pg — ABNORMAL LOW (ref 26.0–34.0)
MCHC: 28.5 g/dL — ABNORMAL LOW (ref 30.0–36.0)
MCV: 70.1 fL — ABNORMAL LOW (ref 80.0–100.0)
Platelets: 520 10*3/uL — ABNORMAL HIGH (ref 150–400)
RBC: 3.61 MIL/uL — ABNORMAL LOW (ref 3.87–5.11)
RDW: 23.9 % — ABNORMAL HIGH (ref 11.5–15.5)
WBC: 10.3 10*3/uL (ref 4.0–10.5)
nRBC: 0.7 % — ABNORMAL HIGH (ref 0.0–0.2)

## 2020-07-16 LAB — PROTIME-INR
INR: 1.2 (ref 0.8–1.2)
Prothrombin Time: 14.7 seconds (ref 11.4–15.2)

## 2020-07-16 MED ORDER — IOHEXOL 9 MG/ML PO SOLN: 500.0000 mL | ORAL | Status: AC

## 2020-07-16 MED ORDER — PANTOPRAZOLE SODIUM 40 MG PO TBEC
40.0000 mg | DELAYED_RELEASE_TABLET | Freq: Every day | ORAL | Status: DC
  Administered 2020-07-17 – 2020-07-21 (×4): 40 mg via ORAL
  Filled 2020-07-16 (×5): qty 1

## 2020-07-16 MED ORDER — SODIUM CHLORIDE 0.9 % IV SOLN
510.0000 mg | Freq: Once | INTRAVENOUS | Status: AC
  Administered 2020-07-16: 510 mg via INTRAVENOUS
  Filled 2020-07-16: qty 510

## 2020-07-16 MED ORDER — FAMOTIDINE 20 MG PO TABS
20.0000 mg | ORAL_TABLET | Freq: Every day | ORAL | Status: DC
  Administered 2020-07-16 – 2020-07-20 (×5): 20 mg via ORAL
  Filled 2020-07-16 (×6): qty 1

## 2020-07-16 MED ORDER — IOHEXOL 9 MG/ML PO SOLN
ORAL | Status: AC
  Filled 2020-07-16: qty 1000

## 2020-07-16 MED ORDER — IOHEXOL 300 MG/ML  SOLN
100.0000 mL | Freq: Once | INTRAMUSCULAR | Status: AC | PRN
  Administered 2020-07-16: 100 mL via INTRAVENOUS

## 2020-07-16 NOTE — Consult Note (Addendum)
Referring Provider: No ref. provider found Primary Care Physician:  Kerin Perna, NP Primary Gastroenterologist:  Dr. Wilfrid Lund  Reason for Consultation:  IDA  HPI: Christina Mcbride is a 49 y.o. female possible history significant for iron deficiency anemia since 2013.  She was initially seen by Dr. [name] October 16, 2019 due to iron deficiency anemia requiring blood transfusions and intermittent IV iron infusions.  During her hospitalization 11/18-11/20/2020 her admission hemoglobin was 3.5 with MCV 71.  She received several units of packed red blood cells.  She underwent an EGD which identified a paraesophageal hiatal hernia without evidence of Cameron lesions or other source of blood loss.  Duodenal biopsies negative for celiac disease. A colonoscopy resulted in fair bowel preparation without source of blood loss identified.  A repeat colonoscopy in 1 year was recommended.  She underwent a small bowel capsule endoscopy June 2021 which was negative. She is followed by hematologist Dr. Rogue Bussing.   She was last seen in our office by Dr. [name] on 07/13/2020.  At that time, she complained of having daily lower sternal and epigastric pain with regurgitation and heartburn on a daily basis, worse at nighttime.  She is sleeping in a sitting position at nighttime.  She complained of having fatigue, shortness of breath and dizziness which she felt was due to worsening anemia.  Plans for an outpatient chest, abdominal and pelvic CT with IV contrast were discussed to further evaluate her hiatal hernia and to look for any other source for her lower chest/epigastric pain prior to a surgical referral for hernia repair.  A CBC was ordered and the results were received after office hours.  Hemoglobin level was 5.9.  (Hg 8.3 on 04/22/2020). She was contacted by our on-call physician and she was instructed to go to the ED for further evaluation but she delayed going to [facility] ED until 07/15/2020.  In  the ED, Hemoglobin level was 5.9.  Hematocrit 22.6.  MCV 66.5.  Platelets 638.  CMP was normal.  Iron 18.  Ferritin 11.  FOBT positive.  She was transfused with 2 units of packed red blood cells.  Post transfusion Hemoglobin 7.2.  She remains hemodynamically stable.  Currently, she continues to have lower sternal and epigastric pain every time she eats.  No nausea or vomiting.  She noticed mild lower abdominal pain after she received 2 units of blood which has resolved. No prior lower abdominal pain. She is passing a normal brown bowel movement most days.  Her stools are somewhat darker brown while taking iron 1 tab 3 times daily.  She denies any black-colored stools.  No rectal bleeding.  No fever, sweats or chills.  No weight loss.  She takes Excedrin Migraine 1 or 2 tablets approximately 4 days monthly for headaches.  No other NSAID use.  She is not taking a PPI. Materna cousin with history of stomach cancer. No family history of colon cancer.  No other complaints at this time.  ED course: Sodium 137.  Potassium 4.3.  Glucose 112.  BUN 20.  Creatinine 0.84.  Calcium 9.0.  Alk phos 77.  Albumin 3.6.  AST 12.  ALT 9.  Total bili 0.4.  Iron 18.  TIBC 410.  Ferritin 11.  BC 9.7.  Hemoglobin 5.9.  Hematocrit 22.6.  MCV 66.5.  Platelets 638.  Sars coronavirus 2 negative.    Abdominal/pelvic CTA 03/31/2020: 1. No evidence for active GI bleed at this time. 2. No evidence of significant atherosclerotic  vascular disease, dissection, aneurysm or other acute vascular abnormality. NON-VASCULAR 1. No evidence of intra-abdominal or retroperitoneal bleeding or hematoma. 2. Large hiatal hernia with completely intrathoracic stomach. Additionally, the pancreas is bowed superiorly with the junction of the body and the tail being drawn upward toward the hernia defect. 3. Left-sided tubal ligation clips. A second set of clips is also visualized within the peritoneal space but do not appear to be affiliated with the  right-sided adnexa. 4. Small fat containing umbilical hernia.  EGD 10/17/2019:  - Normal esophagus. - Medium-sized hiatal hernia. - Normal examined duodenum. Biopsies negative for celiac disease.   Colonoscopy 10/17/2019: - Preparation of the colon was fair. - The examined portion of the ileum was normal. - The examination was otherwise normal on direct and retroflexion views. - No specimens collected. - Repeat colonoscopy in 1 year due to prep  Capsules endoscopy 05/03/2020: Negative Study  Past Medical History:  Diagnosis Date  . Amenia 11/2018  . Blood transfusion without reported diagnosis   . Migraine   . PTSD (post-traumatic stress disorder)    reports daughter was murdered   . Tachycardia   . Thyroid disease   . Vitamin D deficiency     Past Surgical History:  Procedure Laterality Date  . BIOPSY  10/17/2019   Procedure: BIOPSY;  Surgeon: Doran Stabler, MD;  Location: [place];  Service: Gastroenterology;;  . COLONOSCOPY WITH PROPOFOL N/A 10/17/2019   Procedure: COLONOSCOPY WITH PROPOFOL;  Surgeon: Doran Stabler, MD;  Location: [facility];  Service: Gastroenterology;  Laterality: N/A;  . ESOPHAGOGASTRODUODENOSCOPY (EGD) WITH PROPOFOL N/A 10/17/2019   Procedure: ESOPHAGOGASTRODUODENOSCOPY (EGD) WITH PROPOFOL;  Surgeon: Doran Stabler, MD;  Location: [place];  Service: Gastroenterology;  Laterality: N/A;  . TUBAL LIGATION      Prior to Admission medications   Medication Sig Start Date End Date Taking? Authorizing Provider  folic acid (FOLVITE) 1 MG tablet Take 1 mg by mouth daily.   Yes [provider]  Multiple Vitamin (MULTIVITAMIN ADULT) TABS Take 2 tablets by mouth daily.   Yes [provider]    Current Facility-Administered Medications  Medication Dose Route Frequency Provider Last Rate Last Admin  . 0.9 %  sodium chloride infusion  10 mL/hr Intravenous Once Patrecia Pour, MD      . acetaminophen (TYLENOL) tablet  650 mg  650 mg Oral Q6H PRN Patrecia Pour, MD       Or  . acetaminophen (TYLENOL) suppository 650 mg  650 mg Rectal Q6H PRN Patrecia Pour, MD      . ferumoxytol [facility]) 510 mg in sodium chloride 0.9 % 100 mL IVPB  510 mg Intravenous Once Alma Friendly, MD      . folic acid (FOLVITE) tablet 1 mg  1 mg Oral Daily Patrecia Pour, MD      . ondansetron [facility]) tablet 4 mg  4 mg Oral Q6H PRN Patrecia Pour, MD       Or  . ondansetron (ZOFRAN) injection 4 mg  4 mg Intravenous Q6H PRN Vance Gather B, MD      . sodium chloride flush (NS) 0.9 % injection 3 mL  3 mL Intravenous Q12H Patrecia Pour, MD   3 mL at 07/15/20 2025    Allergies as of 07/15/2020 - Review Complete 07/15/2020  Allergen Reaction Noted  . Bee venom Anaphylaxis 06/06/2013    Family History  Problem Relation Age of Onset  .  Hypertension Mother   . Arthritis Mother   . Hypertension Son   . Cancer Maternal Aunt   . Diabetes Maternal Grandmother     Social History   Socioeconomic History  . Marital status: Single    Spouse name: Not on file  . Number of children: Not on file  . Years of education: Not on file  . Highest education level: Not on file  Occupational History  . Not on file  Tobacco Use  . Smoking status: Never Smoker  . Smokeless tobacco: Never Used  Vaping Use  . Vaping Use: Never used  Substance and Sexual Activity  . Alcohol use: No  . Drug use: No  . Sexual activity: Not Currently    Birth control/protection: Surgical  Other Topics Concern  . Not on file  Social History Narrative   Pt relocated to [place] from [place] around 2012.      Lives in [place]; with significant other. 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    Social Determinants of Health   Financial Resource Strain:   . Difficulty of Paying Living Expenses: Not on file  Food Insecurity:   . Worried About Charity fundraiser in the Last Year: Not on file  . Ran Out of Food in the Last Year: Not on  file  Transportation Needs:   . Lack of Transportation (Medical): Not on file  . Lack of Transportation (Non-Medical): Not on file  Physical Activity:   . Days of Exercise per Week: Not on file  . Minutes of Exercise per Session: Not on file  Stress:   . Feeling of Stress : Not on file  Social Connections:   . Frequency of Communication with Friends and Family: Not on file  . Frequency of Social Gatherings with Friends and Family: Not on file  . Attends Religious Services: Not on file  . Active Member of Clubs or Organizations: Not on file  . Attends Archivist Meetings: Not on file  . Marital Status: Not on file  Intimate Partner Violence:   . Fear of Current or Ex-Partner: Not on file  . Emotionally Abused: Not on file  . Physically Abused: Not on file  . Sexually Abused: Not on file    Review of Systems: Gen: Denies fever, sweats or chills. No weight loss.  CV: Denies chest pain, palpitations or edema. Resp: Denies cough, shortness of breath of hemoptysis.  GI: See HPI. GU : Denies urinary burning, blood in urine, increased urinary frequency or incontinence. MS: Denies joint pain, muscles aches or weakness. Derm: Denies rash, itchiness, skin lesions or unhealing ulcers. Psych: Denies depression, anxiety or memory loss. Heme: Denies easy bruising, bleeding. Neuro:  Denies headaches, dizziness or paresthesias. Endo:  Denies any problems with DM, thyroid or adrenal function.  Physical Exam: Vital signs in last 24 hours: Temp:  [98.4 F (36.9 C)-98.9 F (37.2 C)] 98.5 F (36.9 C) (08/20 0528) Pulse Rate:  [80-108] 91 (08/20 0528) Resp:  [16-20] 18 (08/20 0528) BP: (113-155)/(60-93) 121/88 (08/20 0528) SpO2:  [96 %-100 %] 99 % (08/20 0528) Weight:  [122.5 kg] 122.5 kg (08/19 0948) Last BM Date: 07/14/20 General:  Alert, well-developed, well-nourished, pleasant and cooperative in NAD. Head:  Normocephalic and atraumatic. Eyes:  No scleral icterus. Conjunctiva  pink. Ears:  Normal auditory acuity. Nose:  No deformity, discharge or lesions. Mouth:  Dentition intact. No ulcers or lesions.  Neck:  Supple. No lymphadenopathy or thyromegaly.  Lungs:  Breath sounds clear throughout.  Heart:  RRR, no murmurs.  Abdomen:  Soft, nontender. Non distended. + BS x 4 quads. No HSM. Rectal: Deferred. Musculoskeletal:  Symmetrical without gross deformities.  Pulses:  Normal pulses noted. Extremities:  Without clubbing or edema. Neurologic:  Alert and  oriented x4. No focal deficits.  Skin:  Intact without significant lesions or rashes. Psych:  Alert and cooperative. Normal mood and affect.  Intake/Output from previous day: 08/19 0701 - 08/20 0700 In: 1682.4 [P.O.:680; I.V.:200; Blood:802.4] Out: -  Intake/Output this shift: No intake/output data recorded.  Lab Results: Recent Labs    07/13/20 1623 07/15/20 1149 07/16/20 0555  WBC 10.3 9.7 10.3  HGB 5.9 Repeated and verified X2.* 5.9* 7.2*  HCT 20.2 Repeated and verified X2.* 22.6* 25.3*  PLT 742.0* 638* 520*   BMET Recent Labs    07/15/20 1012  NA 137  K 4.3  CL 103  CO2 23  GLUCOSE 112*  BUN 20  CREATININE 0.84  CALCIUM 9.0   LFT Recent Labs    07/15/20 1012  PROT 8.1  ALBUMIN 3.6  AST 12*  ALT 9  ALKPHOS 77  BILITOT 0.4   PT/INR Recent Labs    07/16/20 0600  LABPROT 14.7  INR 1.2   Hepatitis Panel No results for input(s): HEPBSAG, HCVAB, HEPAIGM, HEPBIGM in the last 72 hours.    Studies/Results: No results found.  IMPRESSION/PLAN:  4. 49 year old female with acute on chronic iron deficiency anemia. FOBT positive without obvious active GI bleeding. Admission hemoglobin 5.9. Transfused 2 units of packed red blood cells. Posttransfusion hemoglobin 7.2. EGD and colonoscopy 09/2019 did not show etiology for her IDA. Small bowel capsule endoscopy 04/2020 was negative.  -Chest/abdominal/pelvic CT with oral and IV contrast to assess paraesophageal hernia and to assess  for other etiology for chest/epigastric pain -No plans for endoscopic evaluation at this time -IV fluids per the hospitalist -IV Feraheme ordered by the hospitalist  -Monitor H/H closely -Pantoprazole 68m po QD  Further recommendations per Dr. JWilma FlavinMDorathy Daft 07/16/2020, 8:45 AM  ________________________________________________________________________  LVelora HecklerGI MD note:  I personally examined the patient, reviewed the data and agree with the assessment and plan described above.  She has battled IDA for many years. Extensive GI testing within the past 12 months (including EGD, colonoscopy, small bowel capsule) has no clear etiology however she does have a very large HH.  Really an intrathoracic stomach. She has chronic GI symptoms (early satiety, chest pain and event SOB when eating, severe GERD symptoms) that are very likely from the very large HJoshuaand she should be considered [facility]. We know that IDA can be caused by hiatal hernias. Usually this is via Cameron's erosions that are visible by EGD.  Certainly small but significant Cameron's erosions could evade detection by EGD however.  Since she has NO overt GI bleeding I think she needs surgical consultation to consider eventual HH repair.  We will request consult.  This will likely fix her GI symptoms and will hopefully help her chronic IDA as well.  We are ordering the CT scan chest/abd/pelvis with Dr. DLoletha Carrowwas considering as outpatient.  She was not on PPI prior to admit, we are starting that for her now and also H2 blocker QHS for her overnight water-brash.   DOwens Loffler MD [facility] TxGastroenterology Pager [phone]

## 2020-07-16 NOTE — Progress Notes (Signed)
PROGRESS NOTE  Christina Mcbride SWN:[id number] DOB: Aug 20, 1971 DOA: 07/15/2020 PCP: Kerin Perna, NP  HPI/Recap of past 24 hours: HPI from Dr Vickki Hearing Christina Mcbride is a 49 y.o. female with a history of iron-deficiency anemia and migraines who presented to the ED with lightheadedness and shortness of breath with exertion that has been constant and worsening since June 2021, now severe, consistent with prior episodes where she required transfusions. She has been getting iron infusions regularly, but none since May 2021 due to transportation issues and having covid-19. She denies bleeding of any kind. She was evaluated by GI in 09/2019 with EGD, colonoscopy, capsule endoscopy without bleeding source found (large hiatal hernia noted). In the ED, CBC confirms hgb 5.9.  2u PRBCs ordered and transfused. GI consulted as well.    Today, patient denies any worsening shortness of breath/dizziness/lightheadedness, denies any signs of bleeding, chest pain, abdominal pain, nausea/vomiting.  Patient does report significant reflux likely 2/2 large hiatal hernia     Assessment/Plan: Principal Problem:   Symptomatic anemia Active Problems:   PTSD (post-traumatic stress disorder)   Migraine   Iron deficiency anemia due to chronic blood loss   Gastroesophageal reflux disease without esophagitis   Paraesophageal hiatal hernia   Obesity, Class III, BMI 40-49.9 (morbid obesity) ([place])   Occult blood positive stool   Acute on chronic symptomatic iron deficiency anemia Large hiatal hernia, ??Cameron's erosion Unknown etiology, FOBT positive without obvious active GI bleed Hemoglobin 5.9 on admission Anemia panel showed iron 18, sats 4, ferritin 11 S/P 2 units of PRBC IV Feraheme on 07/16/2020 Recent work-up, colonoscopy/EGD done 09/2019 unremarkable, small bowel capsule endoscopy done 04/2020 was also negative GI consulted, recommend CT chest/abdomen/pelvis while inpatient and possible general surgery  consult for possible hiatal hernia repair (GI will call).  No plans for any further endoscopic evaluation at this time Continue p.o. iron supplementation Start PPI daily, H2 blocker at nighttime Monitor CBC closely  Morbid obesity Lifestyle modification advised       Malnutrition Type:      Malnutrition Characteristics:      Nutrition Interventions:       Estimated body mass index is 42.29 kg/m as calculated from the following:   Height as of this encounter: 5\' 7"  (1.702 m).   Weight as of this encounter: 122.5 kg.     Code Status: Full  Family Communication: Discussed extensively with patient  Disposition Plan: Status is: Observation  The patient will require care spanning > 2 midnights and should be moved to inpatient because: Inpatient level of care appropriate due to severity of illness  Dispo: The patient is from: Home              Anticipated d/c is to: Home              Anticipated d/c date is: 1 day              Patient currently is not medically stable to d/c.    Consultants:  GI  Procedures:  None  Antimicrobials:  None  DVT prophylaxis: SCDs   Objective: Vitals:   07/16/20 0411 07/16/20 0528 07/16/20 1022 07/16/20 1356  BP: 121/80 121/88 123/76 126/80  Pulse: 92 91 80 81  Resp: 18 18 16 17   Temp: 98.6 F (37 C) 98.5 F (36.9 C) 98.7 F (37.1 C) 98.4 F (36.9 C)  TempSrc: Oral Oral Oral   SpO2: 100% 99% 100% 100%  Weight:      Height:  Intake/Output Summary (Last 24 hours) at 07/16/2020 1358 Last data filed at 07/16/2020 1039 Gross per 24 hour  Intake 1682.43 ml  Output 1 ml  Net 1681.43 ml   Filed Weights   07/15/20 0948  Weight: 122.5 kg    Exam:  General: NAD   Cardiovascular: S1, S2 present  Respiratory: CTAB  Abdomen: Soft, nontender, nondistended, bowel sounds present  Musculoskeletal: No bilateral pedal edema noted  Skin: Normal  Psychiatry: Normal mood    Data Reviewed: CBC: Recent  Labs  Lab 07/13/20 1623 07/15/20 1149 07/16/20 0555  WBC 10.3 9.7 10.3  NEUTROABS 7.7  --   --   HGB 5.9 Repeated and verified X2.* 5.9* 7.2*  HCT 20.2 Repeated and verified X2.* 22.6* 25.3*  MCV 60.6 Repeated and verified X2.* 66.5* 70.1*  PLT 742.0* 638* 387*   Basic Metabolic Panel: Recent Labs  Lab 07/15/20 1012  NA 137  K 4.3  CL 103  CO2 23  GLUCOSE 112*  BUN 20  CREATININE 0.84  CALCIUM 9.0   GFR: Estimated Creatinine Clearance: 108.8 mL/min (by C-G formula based on SCr of 0.84 mg/dL). Liver Function Tests: Recent Labs  Lab 07/15/20 1012  AST 12*  ALT 9  ALKPHOS 77  BILITOT 0.4  PROT 8.1  ALBUMIN 3.6   No results for input(s): LIPASE, AMYLASE in the last 168 hours. No results for input(s): AMMONIA in the last 168 hours. Coagulation Profile: Recent Labs  Lab 07/16/20 0600  INR 1.2   Cardiac Enzymes: No results for input(s): CKTOTAL, CKMB, CKMBINDEX, TROPONINI in the last 168 hours. BNP (last 3 results) No results for input(s): PROBNP in the last 8760 hours. HbA1C: No results for input(s): HGBA1C in the last 72 hours. CBG: No results for input(s): GLUCAP in the last 168 hours. Lipid Profile: No results for input(s): CHOL, HDL, LDLCALC, TRIG, CHOLHDL, LDLDIRECT in the last 72 hours. Thyroid Function Tests: No results for input(s): TSH, T4TOTAL, FREET4, T3FREE, THYROIDAB in the last 72 hours. Anemia Panel: Recent Labs    07/15/20 2025  FERRITIN 11  TIBC 410  IRON 18*   Urine analysis:    Component Value Date/Time   COLORURINE YELLOW 11/29/2018 0215   APPEARANCEUR HAZY (A) 11/29/2018 0215   LABSPEC 1.025 11/29/2018 0215   PHURINE 5.0 11/29/2018 0215   GLUCOSEU NEGATIVE 11/29/2018 0215   HGBUR SMALL (A) 11/29/2018 0215   BILIRUBINUR NEGATIVE 11/29/2018 0215   KETONESUR NEGATIVE 11/29/2018 0215   PROTEINUR NEGATIVE 11/29/2018 0215   UROBILINOGEN 0.2 07/10/2015 1115   NITRITE NEGATIVE 11/29/2018 0215   LEUKOCYTESUR NEGATIVE 11/29/2018  0215   Sepsis Labs: @LABRCNTIP (procalcitonin:4,lacticidven:4)  ) Recent Results (from the past 240 hour(s))  SARS Coronavirus 2 by RT PCR (hospital order, performed in [place] hospital lab) Nasopharyngeal Nasopharyngeal Swab     Status: None   Collection Time: 07/15/20  2:56 PM   Specimen: Nasopharyngeal Swab  Result Value Ref Range Status   SARS Coronavirus 2 NEGATIVE NEGATIVE Final    Comment: (NOTE) SARS-CoV-2 target nucleic acids are NOT DETECTED.  The SARS-CoV-2 RNA is generally detectable in upper and lower respiratory specimens during the acute phase of infection. The lowest concentration of SARS-CoV-2 viral copies this assay can detect is 250 copies / mL. A negative result does not preclude SARS-CoV-2 infection and should not be used as the sole basis for treatment or other patient management decisions.  A negative result may occur with improper specimen collection / handling, submission of specimen other than nasopharyngeal  swab, presence of viral mutation(s) within the areas targeted by this assay, and inadequate number of viral copies (<250 copies / mL). A negative result must be combined with clinical observations, patient history, and epidemiological information.  Fact Sheet for Patients:   StrictlyIdeas.no  Fact Sheet for Healthcare Providers: BankingDealers.co.za  This test is not yet approved or  cleared by the [place] FDA and has been authorized for detection and/or diagnosis of SARS-CoV-2 by FDA under an Emergency Use Authorization (EUA).  This EUA will remain in effect (meaning this test can be used) for the duration of the COVID-19 declaration under Section 564(b)(1) of the Act, 21 U.S.C. section 360bbb-3(b)(1), unless the authorization is terminated or revoked sooner.  Performed at [facility], [place] [address] [place], [place] [address]       Studies: No results  found.  Scheduled Meds: . famotidine  20 mg Oral QHS  . folic acid  1 mg Oral Daily  . iohexol  500 mL Oral Q1H  . iohexol      . pantoprazole  40 mg Oral QAC breakfast  . sodium chloride flush  3 mL Intravenous Q12H    Continuous Infusions: . sodium chloride       LOS: 0 days     Alma Friendly, MD [facility]  If 7PM-7AM, please contact night-coverage [email or web address] 07/16/2020, 1:58 PM

## 2020-07-16 NOTE — Plan of Care (Signed)
Plan of care discussed.   

## 2020-07-16 NOTE — Consult Note (Addendum)
Christina Mcbride 03/05/1971  [id number].    Requesting MD: Dr. Ardis Hughs  Chief Complaint/Reason for Consult: Dr. Owens Loffler   HPI: Christina Mcbride is a 49 y.o. female with a past medical history significant for iron deficiency anemia who presented to the [facility] for anemia.    Per chart review patient has been dealing with iron deficiency anemia since 2013.  She has been following with Dr. [name] of GI for this as an outpatient.  She had a EGD 09/2019 that showed a paraesophageal hiatal hernia without evidence of [name] lesions or source of blood loss.  She also had a colonoscopy on 09/2019 without source of blood loss identified. Bx taken were negative for celiacs.  She had a CTA of her abdomen and pelvis on 5/5 that showed no evidence of active GI bleed.  This did show a large hiatal hernia with completely intrathoracic stomach.  She underwent a small bowel endoscopy 04/2020 that was negative.  She follows with hematologist Dr. Burlene Arnt.   Patient was seen by GI on 8/17.  Labs were obtained on outpatient that revealed anemia.  She was instructed to go to the emergency department for evaluation.  In the ED her hemoglobin was 5.9.  FOBT was positive.  She was admitted to the hospitalist service and transfused 2 units.  General surgery was asked to see for hiatal hernia.  Patient reports that she has been having daily epigastric and lower sternal discomfort with associated regurgitation and heartburn that is worse after eating and when trying to lie down at night.  She notes that she usually has to sleep in a sitting position which does help relieve her symptoms.  She notes early satiety with eating.  She denies any associated nausea or vomiting.  She continues to have normal bowel movements daily.  She denies melena or hematochezia. She has a history of a tubal ligation. She is not on any blood thinners.    ROS: Review of Systems  Constitutional: Negative for chills and fever.  Respiratory: Positive  for shortness of breath. Negative for cough.   Cardiovascular: Positive for chest pain (lower sternal/epigastric pain). Negative for leg swelling.  Gastrointestinal: Positive for abdominal pain and heartburn. Negative for blood in stool (not reported hematochezia. +FOBT however), constipation, diarrhea, melena, nausea and vomiting.  Genitourinary: Negative for dysuria.  Musculoskeletal: Negative for back pain.  Neurological: Positive for dizziness and headaches.  Psychiatric/Behavioral: Negative for substance abuse.  All other systems reviewed and are negative.   Family History  Problem Relation Age of Onset  . Hypertension Mother   . Arthritis Mother   . Hypertension Son   . Cancer Maternal Aunt   . Diabetes Maternal Grandmother     Past Medical History:  Diagnosis Date  . Amenia 11/2018  . Blood transfusion without reported diagnosis   . COVID-19 virus infection - May 2021 04/15/2020  . Migraine   . PTSD (post-traumatic stress disorder)    reports daughter was murdered   . Tachycardia   . Thyroid disease   . Vitamin D deficiency     Past Surgical History:  Procedure Laterality Date  . BIOPSY  10/17/2019   Procedure: BIOPSY;  Surgeon: Doran Stabler, MD;  Location: [place];  Service: Gastroenterology;;  . COLONOSCOPY WITH PROPOFOL N/A 10/17/2019   Procedure: COLONOSCOPY WITH PROPOFOL;  Surgeon: Doran Stabler, MD;  Location: [place];  Service: Gastroenterology;  Laterality: N/A;  . ESOPHAGOGASTRODUODENOSCOPY (EGD) WITH PROPOFOL N/A 10/17/2019  Procedure: ESOPHAGOGASTRODUODENOSCOPY (EGD) WITH PROPOFOL;  Surgeon: Doran Stabler, MD;  Location: [place];  Service: Gastroenterology;  Laterality: N/A;  . TUBAL LIGATION      Social History:  reports that she has never smoked. She has never used smokeless tobacco. She reports that she does not drink alcohol and does not use drugs. Lives at home with her husband, and 2 boys Never smoker Does not drink  alcohol No illicit drug use On disability  Allergies:  Allergies  Allergen Reactions  . Bee Venom Anaphylaxis    Medications Prior to Admission  Medication Sig Dispense Refill  . folic acid (FOLVITE) 1 MG tablet Take 1 mg by mouth daily.    . Multiple Vitamin (MULTIVITAMIN ADULT) TABS Take 2 tablets by mouth daily.       Physical Exam: Blood pressure 126/80, pulse 81, temperature 98.4 F (36.9 C), resp. rate 17, height 5\' 7"  (1.702 m), weight 122.5 kg, last menstrual period 05/04/2016, SpO2 100 %. General: pleasant, WD/WN female who is laying in bed in NAD HEENT: head is normocephalic, atraumatic.  Sclera are noninjected.  PERRL.  Ears and nose without any masses or lesions.  Mouth is pink and moist. Dentition fair Heart: regular, rate, and rhythm.  Normal s1,s2. No obvious murmurs, gallops, or rubs noted.  Palpable pedal pulses bilaterally  Lungs: CTAB, no wheezes, rhonchi, or rales noted.  Respiratory effort nonlabored Abd: Soft, protuberant, NT/ND, +BS, no masses, hernias, or organomegaly MS: no BUE/BLE edema, calves soft and nontender Skin: warm and dry with no masses, lesions, or rashes Psych: A&Ox4 with an appropriate affect Neuro: cranial nerves grossly intact, equal strength in BUE/BLE bilaterally, normal speech, though process intact   Results for orders placed or performed during the hospital encounter of 07/15/20 (from the past 48 hour(s))  Comprehensive metabolic panel     Status: Abnormal   Collection Time: 07/15/20 10:12 AM  Result Value Ref Range   Sodium 137 135 - 145 mmol/L   Potassium 4.3 3.5 - 5.1 mmol/L   Chloride 103 98 - 111 mmol/L   CO2 23 22 - 32 mmol/L   Glucose, Bld 112 (H) 70 - 99 mg/dL    Comment: Glucose reference range applies only to samples taken after fasting for at least 8 hours.   BUN 20 6 - 20 mg/dL   Creatinine, Ser 0.84 0.44 - 1.00 mg/dL   Calcium 9.0 8.9 - 10.3 mg/dL   Total Protein 8.1 6.5 - 8.1 g/dL   Albumin 3.6 3.5 - 5.0 g/dL    AST 12 (L) 15 - 41 U/L   ALT 9 0 - 44 U/L   Alkaline Phosphatase 77 38 - 126 U/L   Total Bilirubin 0.4 0.3 - 1.2 mg/dL   GFR calc non Af Amer >60 >60 mL/min   GFR calc Af Amer >60 >60 mL/min   Anion gap 11 5 - 15    Comment: Performed at [facility], [place] [address] [place], [place] [address]  CBC     Status: Abnormal   Collection Time: 07/15/20 11:49 AM  Result Value Ref Range   WBC 9.7 4.0 - 10.5 K/uL   RBC 3.40 (L) 3.87 - 5.11 MIL/uL   Hemoglobin 5.9 (LL) 12.0 - 15.0 g/dL    Comment: Reticulocyte Hemoglobin testing may be clinically indicated, consider ordering this additional test [id number] THIS CRITICAL RESULT HAS VERIFIED AND BEEN CALLED TO JACOB, D BY NICOLE MCCOY ON 08 19 2021 AT 1220, AND HAS  BEEN READ BACK.     HCT 22.6 (L) 36 - 46 %   MCV 66.5 (L) 80.0 - 100.0 fL   MCH 17.4 (L) 26.0 - 34.0 pg   MCHC 26.1 (L) 30.0 - 36.0 g/dL   RDW 21.0 (H) 11.5 - 15.5 %   Platelets 638 (H) 150 - 400 K/uL   nRBC 0.7 (H) 0.0 - 0.2 %    Comment: Performed at [facility], [place] [address] [place], [place] [address]  POC occult blood, ED Provider will collect     Status: Abnormal   Collection Time: 07/15/20  2:40 PM  Result Value Ref Range   Fecal Occult Bld POSITIVE (A) NEGATIVE  Type and screen     Status: None (Preliminary result)   Collection Time: 07/15/20  2:46 PM  Result Value Ref Range   ABO/RH(D) B POS    Antibody Screen POS    Sample Expiration 07/18/2020,2359    Antibody Identification ANTI K    PT AG Type NEGATIVE FOR KELL ANTIGEN    DAT, IgG NEG    Unit Number [id number]    Blood Component Type RED CELLS,LR    Unit division 00    Status of Unit ISSUED,FINAL    Donor AG Type NEGATIVE FOR KELL ANTIGEN    Transfusion Status OK TO TRANSFUSE    Crossmatch Result COMPATIBLE    Unit Number [id number]    Blood Component Type RED CELLS,LR    Unit division 00    Status of Unit ISSUED    Donor AG Type NEGATIVE FOR KELL ANTIGEN     Transfusion Status OK TO TRANSFUSE    Crossmatch Result COMPATIBLE   Prepare RBC (crossmatch)     Status: None   Collection Time: 07/15/20  2:46 PM  Result Value Ref Range   Order Confirmation      ORDER PROCESSED BY BLOOD BANK Performed at [facility], [place] [address], [place], [place] [address]   SARS Coronavirus 2 by RT PCR (hospital order, performed in [facility] hospital lab) Nasopharyngeal Nasopharyngeal Swab     Status: None   Collection Time: 07/15/20  2:56 PM   Specimen: Nasopharyngeal Swab  Result Value Ref Range   SARS Coronavirus 2 NEGATIVE NEGATIVE    Comment: (NOTE) SARS-CoV-2 target nucleic acids are NOT DETECTED.  The SARS-CoV-2 RNA is generally detectable in upper and lower respiratory specimens during the acute phase of infection. The lowest concentration of SARS-CoV-2 viral copies this assay can detect is 250 copies / mL. A negative result does not preclude SARS-CoV-2 infection and should not be used as the sole basis for treatment or other patient management decisions.  A negative result may occur with improper specimen collection / handling, submission of specimen other than nasopharyngeal swab, presence of viral mutation(s) within the areas targeted by this assay, and inadequate number of viral copies (<250 copies / mL). A negative result must be combined with clinical observations, patient history, and epidemiological information.  Fact Sheet for Patients:   StrictlyIdeas.no  Fact Sheet for Healthcare Providers: BankingDealers.co.za  This test is not yet approved or  cleared by the [place] FDA and has been authorized for detection and/or diagnosis of SARS-CoV-2 by FDA under an Emergency Use Authorization (EUA).  This EUA will remain in effect (meaning this test can be used) for the duration of the COVID-19 declaration under Section 564(b)(1) of the Act, 21 U.S.C. section  360bbb-3(b)(1), unless the authorization is terminated or revoked  sooner.  Performed at [facility], [place] [address] [place], [place] [address]   Iron and TIBC     Status: Abnormal   Collection Time: 07/15/20  8:25 PM  Result Value Ref Range   Iron 18 (L) 28 - 170 ug/dL   TIBC 410 250 - 450 ug/dL   Saturation Ratios 4 (L) 10.4 - 31.8 %   UIBC 392 ug/dL    Comment: Performed at [facility], [place] [address] [place], [place] [address]  Ferritin     Status: None   Collection Time: 07/15/20  8:25 PM  Result Value Ref Range   Ferritin 11 11 - 307 ng/mL    Comment: Performed at [facility], [place] [address], [place], [place] [address]  CBC     Status: Abnormal   Collection Time: 07/16/20  5:55 AM  Result Value Ref Range   WBC 10.3 4.0 - 10.5 K/uL   RBC 3.61 (L) 3.87 - 5.11 MIL/uL   Hemoglobin 7.2 (L) 12.0 - 15.0 g/dL    Comment: Reticulocyte Hemoglobin testing may be clinically indicated, consider ordering this additional test [id number]    HCT 25.3 (L) 36 - 46 %   MCV 70.1 (L) 80.0 - 100.0 fL   MCH 19.9 (L) 26.0 - 34.0 pg   MCHC 28.5 (L) 30.0 - 36.0 g/dL   RDW 23.9 (H) 11.5 - 15.5 %   Platelets 520 (H) 150 - 400 K/uL   nRBC 0.7 (H) 0.0 - 0.2 %    Comment: Performed at [facility], [place] [address], [place], [place] [address]  Protime-INR     Status: None   Collection Time: 07/16/20  6:00 AM  Result Value Ref Range   Prothrombin Time 14.7 11.4 - 15.2 seconds   INR 1.2 0.8 - 1.2    Comment: (NOTE) INR goal varies based on device and disease states. Performed at [facility], [place] [address] [place], [place] [address]    No results found.  Anti-infectives (From admission, onward)   None      Assessment/Plan Hiatal Hernia  Acute on Chronic Iron Deficiency Anemia  Positive FOBT  - Patient has had extensive work-up by GI to find the source of her iron deficiency anemia.  This  includes colonoscopy, endoscopy and capsule endoscopy. Given negative workup, it is possible that patients source of IDA could be from her Hiatal Hernia. Her EGD was negative for [name] lesions in Nov. I discussed the case with Dr. Johney Maine. The patient may benefit from surgical repair of the hiatal hernia. This would not occur until next week at the earliest if this was to happen on an inpatient basis. Agree with PPI. We will continue to follow with you and update recs. Transfuse as needed. It appears GI is also obtaining a CT C/A/P.  FEN - Reg VTE - SCDs only 2/2 above ID - None  Jillyn Ledger, [facility] 07/16/2020, 2:04 PM Please see Amion for pager number during day hours 7:00am-4:30pm

## 2020-07-17 DIAGNOSIS — K219 Gastro-esophageal reflux disease without esophagitis: Secondary | ICD-10-CM | POA: Diagnosis not present

## 2020-07-17 DIAGNOSIS — D649 Anemia, unspecified: Secondary | ICD-10-CM | POA: Diagnosis not present

## 2020-07-17 DIAGNOSIS — D62 Acute posthemorrhagic anemia: Secondary | ICD-10-CM | POA: Diagnosis not present

## 2020-07-17 DIAGNOSIS — D500 Iron deficiency anemia secondary to blood loss (chronic): Secondary | ICD-10-CM | POA: Diagnosis not present

## 2020-07-17 LAB — CBC WITH DIFFERENTIAL/PLATELET
Abs Immature Granulocytes: 0.14 10*3/uL — ABNORMAL HIGH (ref 0.00–0.07)
Basophils Absolute: 0 10*3/uL (ref 0.0–0.1)
Basophils Relative: 0 %
Eosinophils Absolute: 0.1 10*3/uL (ref 0.0–0.5)
Eosinophils Relative: 2 %
HCT: 26.1 % — ABNORMAL LOW (ref 36.0–46.0)
Hemoglobin: 7.3 g/dL — ABNORMAL LOW (ref 12.0–15.0)
Immature Granulocytes: 2 %
Lymphocytes Relative: 17 %
Lymphs Abs: 1.5 10*3/uL (ref 0.7–4.0)
MCH: 19.8 pg — ABNORMAL LOW (ref 26.0–34.0)
MCHC: 28 g/dL — ABNORMAL LOW (ref 30.0–36.0)
MCV: 70.7 fL — ABNORMAL LOW (ref 80.0–100.0)
Monocytes Absolute: 0.7 10*3/uL (ref 0.1–1.0)
Monocytes Relative: 7 %
Neutro Abs: 6.5 10*3/uL (ref 1.7–7.7)
Neutrophils Relative %: 72 %
Platelets: 518 10*3/uL — ABNORMAL HIGH (ref 150–400)
RBC: 3.69 MIL/uL — ABNORMAL LOW (ref 3.87–5.11)
RDW: 24.8 % — ABNORMAL HIGH (ref 11.5–15.5)
WBC: 9 10*3/uL (ref 4.0–10.5)
nRBC: 1.3 % — ABNORMAL HIGH (ref 0.0–0.2)

## 2020-07-17 LAB — TYPE AND SCREEN
ABO/RH(D): B POS
Antibody Screen: POSITIVE
DAT, IgG: NEGATIVE
Donor AG Type: NEGATIVE
Donor AG Type: NEGATIVE
PT AG Type: NEGATIVE
Unit division: 0
Unit division: 0

## 2020-07-17 LAB — BPAM RBC
Blood Product Expiration Date: 202109112359
Blood Product Expiration Date: 202109202359
ISSUE DATE / TIME: 202108192210
ISSUE DATE / TIME: 202108200046
Unit Type and Rh: 9500
Unit Type and Rh: 9500

## 2020-07-17 LAB — BASIC METABOLIC PANEL
Anion gap: 9 (ref 5–15)
BUN: 11 mg/dL (ref 6–20)
CO2: 23 mmol/L (ref 22–32)
Calcium: 8.5 mg/dL — ABNORMAL LOW (ref 8.9–10.3)
Chloride: 105 mmol/L (ref 98–111)
Creatinine, Ser: 0.78 mg/dL (ref 0.44–1.00)
GFR calc Af Amer: >60 mL/min (ref >60)
GFR calc non Af Amer: >60 mL/min (ref >60)
Glucose, Bld: 101 mg/dL — ABNORMAL HIGH (ref 70–99)
Potassium: 3.7 mmol/L (ref 3.5–5.1)
Sodium: 137 mmol/L (ref 135–145)

## 2020-07-17 MED ORDER — ALUM & MAG HYDROXIDE-SIMETH 200-200-20 MG/5ML PO SUSP
30.0000 mL | Freq: Once | ORAL | Status: AC
  Administered 2020-07-17: 30 mL via ORAL
  Filled 2020-07-17: qty 30

## 2020-07-17 NOTE — Progress Notes (Signed)
PROGRESS NOTE  Christina Mcbride DEY:[id number] DOB: 04-Apr-1971 DOA: 07/15/2020 PCP: Kerin Perna, NP  HPI/Recap of past 24 hours: HPI from Dr Vickki Hearing Christina Mcbride is a 49 y.o. female with a history of iron-deficiency anemia and migraines who presented to the ED with lightheadedness and shortness of breath with exertion that has been constant and worsening since June 2021, now severe, consistent with prior episodes where she required transfusions. She has been getting iron infusions regularly, but none since May 2021 due to transportation issues and having covid-19. She denies bleeding of any kind. She was evaluated by GI in 09/2019 with EGD, colonoscopy, capsule endoscopy without bleeding source found (large hiatal hernia noted). In the ED, CBC confirms hgb 5.9.  2u PRBCs ordered and transfused. GI consulted as well.    Today, patient reported some indigestion overnight, denies any other new complaints.     Assessment/Plan: Principal Problem:   Symptomatic anemia Active Problems:   PTSD (post-traumatic stress disorder)   Migraine   Iron deficiency anemia due to chronic blood loss   Gastroesophageal reflux disease without esophagitis   Paraesophageal hiatal hernia   Obesity, Class III, BMI 40-49.9 (morbid obesity) ([place])   Occult blood positive stool   Acute on chronic blood loss anemia   Acute on chronic symptomatic iron deficiency anemia Large hiatal hernia, ??Cameron's erosion Unknown etiology, FOBT positive without obvious active GI bleed Hemoglobin 5.9 on admission Anemia panel showed iron 18, sats 4, ferritin 11 S/P 2 units of PRBC IV Feraheme on 07/16/2020 Recent work-up, colonoscopy/EGD done 09/2019 unremarkable, small bowel capsule endoscopy done 04/2020 was also negative GI consulted, recommend general surgery consult for possible hiatal hernia repair. No plans for any further endoscopic evaluation at this time CT abdomen/pelvis showed large hiatal hernia with gastric  volvulus, no evidence of obstruction General surgery consulted, planning for possible hiatal hernia repair on 07/19/2020 Continue p.o. iron supplementation Continue PPI daily, H2 blocker at nighttime Monitor CBC closely  Morbid obesity Lifestyle modification advised       Malnutrition Type:      Malnutrition Characteristics:      Nutrition Interventions:       Estimated body mass index is 42.29 kg/m as calculated from the following:   Height as of this encounter: 5\' 7"  (1.702 m).   Weight as of this encounter: 122.5 kg.     Code Status: Full  Family Communication: Discussed extensively with patient  Disposition Plan: Status is: Inpatient  The patient will require care spanning > 2 midnights and should be moved to inpatient because: Inpatient level of care appropriate due to severity of illness  Dispo: The patient is from: Home              Anticipated d/c is to: Home              Anticipated d/c date is: 3 days              Patient currently is not medically stable to d/c.    Consultants:  GI  General surgery  Procedures:  None  Antimicrobials:  None  DVT prophylaxis: SCDs   Objective: Vitals:   07/16/20 1356 07/16/20 2100 07/17/20 0530 07/17/20 1313  BP: 126/80 121/88 134/71 133/75  Pulse: 81 85 78 93  Resp: 17 16 18 18   Temp: 98.4 F (36.9 C) 98.2 F (36.8 C) 98.6 F (37 C) 98.6 F (37 C)  TempSrc:  Oral Oral Oral  SpO2: 100% 100% 100% 100%  Weight:  Height:        Intake/Output Summary (Last 24 hours) at 07/17/2020 1405 Last data filed at 07/17/2020 1246 Gross per 24 hour  Intake 1320 ml  Output 0 ml  Net 1320 ml   Filed Weights   07/15/20 0948  Weight: 122.5 kg    Exam:  General: NAD   Cardiovascular: S1, S2 present  Respiratory: CTAB  Abdomen: Soft, nontender, nondistended, bowel sounds present  Musculoskeletal: No bilateral pedal edema noted  Skin: Normal  Psychiatry: Normal mood    Data  Reviewed: CBC: Recent Labs  Lab 07/13/20 1623 07/15/20 1149 07/16/20 0555 07/17/20 0323  WBC 10.3 9.7 10.3 9.0  NEUTROABS 7.7  --   --  6.5  HGB 5.9 Repeated and verified X2.* 5.9* 7.2* 7.3*  HCT 20.2 Repeated and verified X2.* 22.6* 25.3* 26.1*  MCV 60.6 Repeated and verified X2.* 66.5* 70.1* 70.7*  PLT 742.0* 638* 520* 809*   Basic Metabolic Panel: Recent Labs  Lab 07/15/20 1012 07/17/20 0323  NA 137 137  K 4.3 3.7  CL 103 105  CO2 23 23  GLUCOSE 112* 101*  BUN 20 11  CREATININE 0.84 0.78  CALCIUM 9.0 8.5*   GFR: Estimated Creatinine Clearance: 114.2 mL/min (by C-G formula based on SCr of 0.78 mg/dL). Liver Function Tests: Recent Labs  Lab 07/15/20 1012  AST 12*  ALT 9  ALKPHOS 77  BILITOT 0.4  PROT 8.1  ALBUMIN 3.6   No results for input(s): LIPASE, AMYLASE in the last 168 hours. No results for input(s): AMMONIA in the last 168 hours. Coagulation Profile: Recent Labs  Lab 07/16/20 0600  INR 1.2   Cardiac Enzymes: No results for input(s): CKTOTAL, CKMB, CKMBINDEX, TROPONINI in the last 168 hours. BNP (last 3 results) No results for input(s): PROBNP in the last 8760 hours. HbA1C: No results for input(s): HGBA1C in the last 72 hours. CBG: No results for input(s): GLUCAP in the last 168 hours. Lipid Profile: No results for input(s): CHOL, HDL, LDLCALC, TRIG, CHOLHDL, LDLDIRECT in the last 72 hours. Thyroid Function Tests: No results for input(s): TSH, T4TOTAL, FREET4, T3FREE, THYROIDAB in the last 72 hours. Anemia Panel: Recent Labs    07/15/20 2025  FERRITIN 11  TIBC 410  IRON 18*   Urine analysis:    Component Value Date/Time   COLORURINE YELLOW 11/29/2018 0215   APPEARANCEUR HAZY (A) 11/29/2018 0215   LABSPEC 1.025 11/29/2018 0215   PHURINE 5.0 11/29/2018 0215   GLUCOSEU NEGATIVE 11/29/2018 0215   HGBUR SMALL (A) 11/29/2018 0215   BILIRUBINUR NEGATIVE 11/29/2018 0215   KETONESUR NEGATIVE 11/29/2018 0215   PROTEINUR NEGATIVE 11/29/2018  0215   UROBILINOGEN 0.2 07/10/2015 1115   NITRITE NEGATIVE 11/29/2018 0215   LEUKOCYTESUR NEGATIVE 11/29/2018 0215   Sepsis Labs: @LABRCNTIP (procalcitonin:4,lacticidven:4)  ) Recent Results (from the past 240 hour(s))  SARS Coronavirus 2 by RT PCR (hospital order, performed in [place] hospital lab) Nasopharyngeal Nasopharyngeal Swab     Status: None   Collection Time: 07/15/20  2:56 PM   Specimen: Nasopharyngeal Swab  Result Value Ref Range Status   SARS Coronavirus 2 NEGATIVE NEGATIVE Final    Comment: (NOTE) SARS-CoV-2 target nucleic acids are NOT DETECTED.  The SARS-CoV-2 RNA is generally detectable in upper and lower respiratory specimens during the acute phase of infection. The lowest concentration of SARS-CoV-2 viral copies this assay can detect is 250 copies / mL. A negative result does not preclude SARS-CoV-2 infection and should not be used as the sole  basis for treatment or other patient management decisions.  A negative result may occur with improper specimen collection / handling, submission of specimen other than nasopharyngeal swab, presence of viral mutation(s) within the areas targeted by this assay, and inadequate number of viral copies (<250 copies / mL). A negative result must be combined with clinical observations, patient history, and epidemiological information.  Fact Sheet for Patients:   StrictlyIdeas.no  Fact Sheet for Healthcare Providers: BankingDealers.co.za  This test is not yet approved or  cleared by the [place] FDA and has been authorized for detection and/or diagnosis of SARS-CoV-2 by FDA under an Emergency Use Authorization (EUA).  This EUA will remain in effect (meaning this test can be used) for the duration of the COVID-19 declaration under Section 564(b)(1) of the Act, 21 U.S.C. section 360bbb-3(b)(1), unless the authorization is terminated or revoked sooner.  Performed at [facility], [place] [address] [place], [place] [address]       Studies: CT CHEST ABDOMEN PELVIS W CONTRAST  Result Date: 07/16/2020 CLINICAL DATA:  49 year old female with chest and upper abdominal pain. Anemia. EXAM: CT CHEST, ABDOMEN, AND PELVIS WITH CONTRAST TECHNIQUE: Multidetector CT imaging of the chest, abdomen and pelvis was performed following the standard protocol during bolus administration of intravenous contrast. CONTRAST:  159mL OMNIPAQUE IOHEXOL 300 MG/ML  SOLN COMPARISON:  Chest radiograph dated 03/31/2020 and CT abdomen pelvis dated 03/31/2020 FINDINGS: CT CHEST FINDINGS Cardiovascular: There is no cardiomegaly or pericardial effusion. The thoracic aorta is unremarkable. The origins of the great vessels of the aortic arch appear patent. The central pulmonary arteries are unremarkable. Mediastinum/Nodes: There is no hilar or mediastinal adenopathy. The esophagus and the thyroid gland are grossly unremarkable. No mediastinal fluid collection. Lungs/Pleura: Fall there is minimal left lung base atelectasis. No focal consolidation, pleural effusion, pneumothorax. The central airways are patent. Musculoskeletal: No chest wall mass or suspicious bone lesions identified. CT ABDOMEN PELVIS FINDINGS No intra-abdominal free air or free fluid. Hepatobiliary: Several subcentimeter hepatic hypodense lesions are too small to characterize. No intrahepatic biliary ductal dilatation. The gallbladder is unremarkable. Pancreas: Unremarkable. No pancreatic ductal dilatation or surrounding inflammatory changes. Spleen: Normal in size without focal abnormality. Adrenals/Urinary Tract: The adrenal glands unremarkable. There is no hydronephrosis on either side. There is symmetric enhancement and excretion of contrast by both kidneys. There is a 1 cm left renal inferior pole cyst. The visualized ureters and urinary bladder appear unremarkable. Stomach/Bowel: There is a large hiatal hernia containing the  majority of the stomach with organo-axial gastric volvulus similar to prior CT. No evidence of gastric outlet obstruction. There is no bowel obstruction or active inflammation. The appendix is normal. Vascular/Lymphatic: The abdominal aorta and IVC unremarkable. No portal venous gas. There is no adenopathy. Reproductive: The uterus is anteverted and grossly unremarkable. Bilateral tubal ligation clips noted. Other: None Musculoskeletal: Bilateral sacroiliitis. No acute osseous pathology. IMPRESSION: 1. No acute intrathoracic, abdominal, or pelvic pathology. 2. Large hiatal hernia with gastric volvulus similar to prior CT. No evidence of obstruction. Electronically Signed   By: Anner Crete M.D.   On: 07/16/2020 17:46    Scheduled Meds: . famotidine  20 mg Oral QHS  . folic acid  1 mg Oral Daily  . pantoprazole  40 mg Oral QAC breakfast  . sodium chloride flush  3 mL Intravenous Q12H    Continuous Infusions: . sodium chloride       LOS: 1 day     Alma Friendly, MD [facility]  If 7PM-7AM, please contact night-coverage [email or web address] 07/17/2020, 2:05 PM

## 2020-07-17 NOTE — Progress Notes (Addendum)
S: some nausea and indigestion overnight, LH on standing O: BP 134/71 (BP Location: Right Arm)   Pulse 78   Temp 98.6 F (37 C) (Oral)   Resp 18   Ht 5\' 7"  (1.702 m)   Wt 122.5 kg   LMP 05/04/2016   SpO2 100%   BMI 42.29 kg/m  Gen: NAD Neuro: AOx4  A/P 49 yo female with symptomatic anemia and findings of large hiatal hernia with concern for volvulus -reviewed CT scan from yesterday -spoke briefly about hiatal hernia and repair options. I will talk with Dr. Rosendo Gros who will see her Monday to discuss further plans for repair -ok for diet this weekend as tolerated, NPO midnight 8/22

## 2020-07-18 DIAGNOSIS — D62 Acute posthemorrhagic anemia: Secondary | ICD-10-CM | POA: Diagnosis not present

## 2020-07-18 DIAGNOSIS — D649 Anemia, unspecified: Secondary | ICD-10-CM | POA: Diagnosis not present

## 2020-07-18 DIAGNOSIS — K219 Gastro-esophageal reflux disease without esophagitis: Secondary | ICD-10-CM | POA: Diagnosis not present

## 2020-07-18 DIAGNOSIS — D500 Iron deficiency anemia secondary to blood loss (chronic): Secondary | ICD-10-CM | POA: Diagnosis not present

## 2020-07-18 LAB — CBC WITH DIFFERENTIAL/PLATELET
Abs Immature Granulocytes: 0.34 10*3/uL — ABNORMAL HIGH (ref 0.00–0.07)
Basophils Absolute: 0.1 10*3/uL (ref 0.0–0.1)
Basophils Relative: 1 %
Eosinophils Absolute: 0.2 10*3/uL (ref 0.0–0.5)
Eosinophils Relative: 2 %
HCT: 25.4 % — ABNORMAL LOW (ref 36.0–46.0)
Hemoglobin: 7.2 g/dL — ABNORMAL LOW (ref 12.0–15.0)
Immature Granulocytes: 3 %
Lymphocytes Relative: 17 %
Lymphs Abs: 1.7 10*3/uL (ref 0.7–4.0)
MCH: 20.1 pg — ABNORMAL LOW (ref 26.0–34.0)
MCHC: 28.3 g/dL — ABNORMAL LOW (ref 30.0–36.0)
MCV: 70.8 fL — ABNORMAL LOW (ref 80.0–100.0)
Monocytes Absolute: 0.6 10*3/uL (ref 0.1–1.0)
Monocytes Relative: 6 %
Neutro Abs: 7.1 10*3/uL (ref 1.7–7.7)
Neutrophils Relative %: 71 %
Platelets: 478 10*3/uL — ABNORMAL HIGH (ref 150–400)
RBC: 3.59 MIL/uL — ABNORMAL LOW (ref 3.87–5.11)
RDW: 25.2 % — ABNORMAL HIGH (ref 11.5–15.5)
WBC: 9.9 10*3/uL (ref 4.0–10.5)
nRBC: 3.1 % — ABNORMAL HIGH (ref 0.0–0.2)

## 2020-07-18 LAB — BASIC METABOLIC PANEL
Anion gap: 8 (ref 5–15)
BUN: 12 mg/dL (ref 6–20)
CO2: 25 mmol/L (ref 22–32)
Calcium: 8.4 mg/dL — ABNORMAL LOW (ref 8.9–10.3)
Chloride: 105 mmol/L (ref 98–111)
Creatinine, Ser: 0.82 mg/dL (ref 0.44–1.00)
GFR calc Af Amer: >60 mL/min (ref >60)
GFR calc non Af Amer: >60 mL/min (ref >60)
Glucose, Bld: 102 mg/dL — ABNORMAL HIGH (ref 70–99)
Potassium: 3.6 mmol/L (ref 3.5–5.1)
Sodium: 138 mmol/L (ref 135–145)

## 2020-07-18 NOTE — Progress Notes (Signed)
PROGRESS NOTE  Christina Mcbride JTT:[id number] DOB: 20-Mar-1971 DOA: 07/15/2020 PCP: Kerin Perna, NP  HPI/Recap of past 24 hours: HPI from Dr Vickki Hearing Christina Mcbride is a 49 y.o. female with a history of iron-deficiency anemia and migraines who presented to the ED with lightheadedness and shortness of breath with exertion that has been constant and worsening since June 2021, now severe, consistent with prior episodes where she required transfusions. She has been getting iron infusions regularly, but none since May 2021 due to transportation issues and having covid-19. She denies bleeding of any kind. She was evaluated by GI in 09/2019 with EGD, colonoscopy, capsule endoscopy without bleeding source found (large hiatal hernia noted). In the ED, CBC confirms hgb 5.9.  2u PRBCs ordered and transfused. GI consulted as well.     Today, patient notes worsening epigastric discomfort and some rattling in her chest, usually after meals.  Discussed possibly due to her large hiatal hernia.  Denied any new complaints.     Assessment/Plan: Principal Problem:   Symptomatic anemia Active Problems:   PTSD (post-traumatic stress disorder)   Migraine   Iron deficiency anemia due to chronic blood loss   Gastroesophageal reflux disease without esophagitis   Paraesophageal hiatal hernia   Obesity, Class III, BMI 40-49.9 (morbid obesity) ([place])   Occult blood positive stool   Acute on chronic blood loss anemia   Acute on chronic symptomatic iron deficiency anemia Large hiatal hernia, ??Cameron's erosion Unknown etiology, FOBT positive without obvious active GI bleed Hemoglobin 5.9 on admission Anemia panel showed iron 18, sats 4, ferritin 11 S/P 2 units of PRBC IV Feraheme on 07/16/2020 Recent work-up, colonoscopy/EGD done 09/2019 unremarkable, small bowel capsule endoscopy done 04/2020 was also negative GI consulted, recommend general surgery consult for possible hiatal hernia repair. No plans for any  further endoscopic evaluation at this time CT abdomen/pelvis showed large hiatal hernia with gastric volvulus, no evidence of obstruction General surgery consulted, planning for possible hiatal hernia repair on 07/19/2020 Continue p.o. iron supplementation Continue PPI daily, H2 blocker at nighttime Monitor CBC closely  Morbid obesity Lifestyle modification advised       Malnutrition Type:      Malnutrition Characteristics:      Nutrition Interventions:       Estimated body mass index is 42.29 kg/m as calculated from the following:   Height as of this encounter: 5\' 7"  (1.702 m).   Weight as of this encounter: 122.5 kg.     Code Status: Full  Family Communication: Discussed extensively with patient  Disposition Plan: Status is: Inpatient  The patient will require care spanning > 2 midnights and should be moved to inpatient because: Inpatient level of care appropriate due to severity of illness  Dispo: The patient is from: Home              Anticipated d/c is to: Home              Anticipated d/c date is: 3 days              Patient currently is not medically stable to d/c.    Consultants:  GI  General surgery  Procedures:  None  Antimicrobials:  None  DVT prophylaxis: SCDs   Objective: Vitals:   07/17/20 0530 07/17/20 1313 07/17/20 2123 07/18/20 0523  BP: 134/71 133/75 113/70 131/76  Pulse: 78 93 99 85  Resp: 18 18 20 19   Temp: 98.6 F (37 C) 98.6 F (37 C) 98.9 F (37.2  C) 98.8 F (37.1 C)  TempSrc: Oral Oral    SpO2: 100% 100% 97% 96%  Weight:      Height:        Intake/Output Summary (Last 24 hours) at 07/18/2020 1336 Last data filed at 07/18/2020 0933 Gross per 24 hour  Intake 960 ml  Output --  Net 960 ml   Filed Weights   07/15/20 0948  Weight: 122.5 kg    Exam:  General: NAD   Cardiovascular: S1, S2 present  Respiratory: CTAB  Abdomen: Soft, nontender, nondistended, bowel sounds present  Musculoskeletal: No  bilateral pedal edema noted  Skin: Normal  Psychiatry: Normal mood    Data Reviewed: CBC: Recent Labs  Lab 07/13/20 1623 07/15/20 1149 07/16/20 0555 07/17/20 0323 07/18/20 0307  WBC 10.3 9.7 10.3 9.0 9.9  NEUTROABS 7.7  --   --  6.5 7.1  HGB 5.9 Repeated and verified X2.* 5.9* 7.2* 7.3* 7.2*  HCT 20.2 Repeated and verified X2.* 22.6* 25.3* 26.1* 25.4*  MCV 60.6 Repeated and verified X2.* 66.5* 70.1* 70.7* 70.8*  PLT 742.0* 638* 520* 518* 371*   Basic Metabolic Panel: Recent Labs  Lab 07/15/20 1012 07/17/20 0323 07/18/20 0307  NA 137 137 138  K 4.3 3.7 3.6  CL 103 105 105  CO2 23 23 25   GLUCOSE 112* 101* 102*  BUN 20 11 12   CREATININE 0.84 0.78 0.82  CALCIUM 9.0 8.5* 8.4*   GFR: Estimated Creatinine Clearance: 111.4 mL/min (by C-G formula based on SCr of 0.82 mg/dL). Liver Function Tests: Recent Labs  Lab 07/15/20 1012  AST 12*  ALT 9  ALKPHOS 77  BILITOT 0.4  PROT 8.1  ALBUMIN 3.6   No results for input(s): LIPASE, AMYLASE in the last 168 hours. No results for input(s): AMMONIA in the last 168 hours. Coagulation Profile: Recent Labs  Lab 07/16/20 0600  INR 1.2   Cardiac Enzymes: No results for input(s): CKTOTAL, CKMB, CKMBINDEX, TROPONINI in the last 168 hours. BNP (last 3 results) No results for input(s): PROBNP in the last 8760 hours. HbA1C: No results for input(s): HGBA1C in the last 72 hours. CBG: No results for input(s): GLUCAP in the last 168 hours. Lipid Profile: No results for input(s): CHOL, HDL, LDLCALC, TRIG, CHOLHDL, LDLDIRECT in the last 72 hours. Thyroid Function Tests: No results for input(s): TSH, T4TOTAL, FREET4, T3FREE, THYROIDAB in the last 72 hours. Anemia Panel: Recent Labs    07/15/20 2025  FERRITIN 11  TIBC 410  IRON 18*   Urine analysis:    Component Value Date/Time   COLORURINE YELLOW 11/29/2018 0215   APPEARANCEUR HAZY (A) 11/29/2018 0215   LABSPEC 1.025 11/29/2018 0215   PHURINE 5.0 11/29/2018 0215    GLUCOSEU NEGATIVE 11/29/2018 0215   HGBUR SMALL (A) 11/29/2018 0215   BILIRUBINUR NEGATIVE 11/29/2018 0215   KETONESUR NEGATIVE 11/29/2018 0215   PROTEINUR NEGATIVE 11/29/2018 0215   UROBILINOGEN 0.2 07/10/2015 1115   NITRITE NEGATIVE 11/29/2018 0215   LEUKOCYTESUR NEGATIVE 11/29/2018 0215   Sepsis Labs: @LABRCNTIP (procalcitonin:4,lacticidven:4)  ) Recent Results (from the past 240 hour(s))  SARS Coronavirus 2 by RT PCR (hospital order, performed in [place] hospital lab) Nasopharyngeal Nasopharyngeal Swab     Status: None   Collection Time: 07/15/20  2:56 PM   Specimen: Nasopharyngeal Swab  Result Value Ref Range Status   SARS Coronavirus 2 NEGATIVE NEGATIVE Final    Comment: (NOTE) SARS-CoV-2 target nucleic acids are NOT DETECTED.  The SARS-CoV-2 RNA is generally detectable in upper and  lower respiratory specimens during the acute phase of infection. The lowest concentration of SARS-CoV-2 viral copies this assay can detect is 250 copies / mL. A negative result does not preclude SARS-CoV-2 infection and should not be used as the sole basis for treatment or other patient management decisions.  A negative result may occur with improper specimen collection / handling, submission of specimen other than nasopharyngeal swab, presence of viral mutation(s) within the areas targeted by this assay, and inadequate number of viral copies (<250 copies / mL). A negative result must be combined with clinical observations, patient history, and epidemiological information.  Fact Sheet for Patients:   StrictlyIdeas.no  Fact Sheet for Healthcare Providers: BankingDealers.co.za  This test is not yet approved or  cleared by the [place] FDA and has been authorized for detection and/or diagnosis of SARS-CoV-2 by FDA under an Emergency Use Authorization (EUA).  This EUA will remain in effect (meaning this test can be used) for the duration of  the COVID-19 declaration under Section 564(b)(1) of the Act, 21 U.S.C. section 360bbb-3(b)(1), unless the authorization is terminated or revoked sooner.  Performed at [facility], [place] [address], [place], [place] [address]       Studies: No results found.  Scheduled Meds: . famotidine  20 mg Oral QHS  . folic acid  1 mg Oral Daily  . pantoprazole  40 mg Oral QAC breakfast  . sodium chloride flush  3 mL Intravenous Q12H    Continuous Infusions: . sodium chloride       LOS: 2 days     Alma Friendly, MD [facility]  If 7PM-7AM, please contact night-coverage [email or web address] 07/18/2020, 1:36 PM

## 2020-07-18 NOTE — Progress Notes (Addendum)
Pt c/o rattling where her hernia is located. On auscultation, rattling was heard in the chest and the lower abd. Pt denied any SOB, only discomfort where the hernia is located. Lesia Sago, MD paged.  Rattling r/t hiatal hernia per MD.

## 2020-07-18 NOTE — Plan of Care (Signed)
Plan of care reviewed and discussed with the patient. 

## 2020-07-19 ENCOUNTER — Telehealth: Payer: Self-pay

## 2020-07-19 DIAGNOSIS — K449 Diaphragmatic hernia without obstruction or gangrene: Secondary | ICD-10-CM | POA: Diagnosis not present

## 2020-07-19 DIAGNOSIS — D649 Anemia, unspecified: Secondary | ICD-10-CM | POA: Diagnosis not present

## 2020-07-19 DIAGNOSIS — D62 Acute posthemorrhagic anemia: Secondary | ICD-10-CM

## 2020-07-19 DIAGNOSIS — K219 Gastro-esophageal reflux disease without esophagitis: Secondary | ICD-10-CM | POA: Diagnosis not present

## 2020-07-19 DIAGNOSIS — D500 Iron deficiency anemia secondary to blood loss (chronic): Secondary | ICD-10-CM | POA: Diagnosis not present

## 2020-07-19 LAB — BASIC METABOLIC PANEL
Anion gap: 7 (ref 5–15)
BUN: 14 mg/dL (ref 6–20)
CO2: 25 mmol/L (ref 22–32)
Calcium: 8.5 mg/dL — ABNORMAL LOW (ref 8.9–10.3)
Chloride: 106 mmol/L (ref 98–111)
Creatinine, Ser: 0.78 mg/dL (ref 0.44–1.00)
GFR calc Af Amer: >60 mL/min (ref >60)
GFR calc non Af Amer: >60 mL/min (ref >60)
Glucose, Bld: 100 mg/dL — ABNORMAL HIGH (ref 70–99)
Potassium: 3.8 mmol/L (ref 3.5–5.1)
Sodium: 138 mmol/L (ref 135–145)

## 2020-07-19 LAB — CBC WITH DIFFERENTIAL/PLATELET
Abs Immature Granulocytes: 0.35 10*3/uL — ABNORMAL HIGH (ref 0.00–0.07)
Basophils Absolute: 0.1 10*3/uL (ref 0.0–0.1)
Basophils Relative: 1 %
Eosinophils Absolute: 0.2 10*3/uL (ref 0.0–0.5)
Eosinophils Relative: 2 %
HCT: 27.5 % — ABNORMAL LOW (ref 36.0–46.0)
Hemoglobin: 7.6 g/dL — ABNORMAL LOW (ref 12.0–15.0)
Immature Granulocytes: 3 %
Lymphocytes Relative: 17 %
Lymphs Abs: 1.8 10*3/uL (ref 0.7–4.0)
MCH: 20.2 pg — ABNORMAL LOW (ref 26.0–34.0)
MCHC: 27.6 g/dL — ABNORMAL LOW (ref 30.0–36.0)
MCV: 72.9 fL — ABNORMAL LOW (ref 80.0–100.0)
Monocytes Absolute: 0.6 10*3/uL (ref 0.1–1.0)
Monocytes Relative: 6 %
Neutro Abs: 7.7 10*3/uL (ref 1.7–7.7)
Neutrophils Relative %: 71 %
Platelets: 474 10*3/uL — ABNORMAL HIGH (ref 150–400)
RBC: 3.77 MIL/uL — ABNORMAL LOW (ref 3.87–5.11)
RDW: 26.9 % — ABNORMAL HIGH (ref 11.5–15.5)
WBC: 10.7 10*3/uL — ABNORMAL HIGH (ref 4.0–10.5)
nRBC: 2.1 % — ABNORMAL HIGH (ref 0.0–0.2)

## 2020-07-19 LAB — PREPARE RBC (CROSSMATCH)

## 2020-07-19 MED ORDER — KCL IN DEXTROSE-NACL 40-5-0.9 MEQ/L-%-% IV SOLN
INTRAVENOUS | Status: DC
  Filled 2020-07-19 (×4): qty 1000

## 2020-07-19 MED ORDER — SODIUM CHLORIDE 0.9% IV SOLUTION: Freq: Once | INTRAVENOUS | Status: AC

## 2020-07-19 MED ORDER — DEXTROSE 5 % IV SOLN
3.0000 g | INTRAVENOUS | Status: AC
  Administered 2020-07-20: 3 g via INTRAVENOUS
  Filled 2020-07-19: qty 3

## 2020-07-19 MED ORDER — MUPIROCIN 2 % EX OINT: 1.0000 "application " | TOPICAL_OINTMENT | Freq: Two times a day (BID) | CUTANEOUS | Status: DC

## 2020-07-19 NOTE — Progress Notes (Deleted)
Report called to [facility] to nurse [facility]. Patient being discharged in stable condition.

## 2020-07-19 NOTE — Telephone Encounter (Signed)
Thanks for checking.  She is currently hospitalized at [facility] and I our group has been following her.  - HD

## 2020-07-19 NOTE — Progress Notes (Signed)
[facility] Progress Note  Chief Complaint: Iron deficiency anemia  History:  Christina Mcbride has not seen any overt GI bleeding, which is also not occurred as an outpatient.  She still has lower chest/epigastric discomfort from her large hiatal hernia with gastric volvulus/intrathoracic stomach. I spoke with Dr. Rosendo Gros of surgery earlier today, and he has seen Christina Mcbride with plans for surgery tomorrow.  ROS:  Respiratory: No dyspnea Urinary: No dysuria  Objective:   Current Facility-Administered Medications:  .  0.9 %  sodium chloride infusion (Manually program via Guardrails IV Fluids), , Intravenous, Once, Ralene Ok, MD .  0.9 %  sodium chloride infusion, 10 mL/hr, Intravenous, Once, Patrecia Pour, MD .  acetaminophen (TYLENOL) tablet 650 mg, 650 mg, Oral, Q6H PRN, 650 mg at 07/17/20 1445 **OR** acetaminophen (TYLENOL) suppository 650 mg, 650 mg, Rectal, Q6H PRN, Patrecia Pour, MD .  ceFAZolin (ANCEF) IVPB 1 g/50 mL premix, 1 g, Intravenous, On Call to OR, Ralene Ok, MD .  Derrill Memo ON 07/20/2020] dextrose 5 % and 0.9 % NaCl with KCl 40 mEq/L infusion, , Intravenous, Continuous, Ralene Ok, MD .  famotidine (PEPCID) tablet 20 mg, 20 mg, Oral, QHS, Milus Banister, MD, 20 mg at 07/18/20 2148 .  folic acid (FOLVITE) tablet 1 mg, 1 mg, Oral, Daily, Patrecia Pour, MD, 1 mg at 07/18/20 0816 .  ondansetron (ZOFRAN) tablet 4 mg, 4 mg, Oral, Q6H PRN **OR** ondansetron (ZOFRAN) injection 4 mg, 4 mg, Intravenous, Q6H PRN, Vance Gather B, MD .  pantoprazole (PROTONIX) EC tablet 40 mg, 40 mg, Oral, QAC breakfast, Milus Banister, MD, 40 mg at 07/18/20 0816 .  sodium chloride flush (NS) 0.9 % injection 3 mL, 3 mL, Intravenous, Q12H, Patrecia Pour, MD, 3 mL at 07/18/20 2148  . sodium chloride    .  ceFAZolin (ANCEF) IV    . [START ON 07/20/2020] dextrose 5 % and 0.9 % NaCl with KCl 40 mEq/L       Vital signs in last 24 hrs: Vitals:   07/18/20 2145 07/19/20 0459  BP: 123/62 131/65   Pulse: 80 78  Resp: 17 16  Temp: 98.5 F (36.9 C) 98.1 F (36.7 C)  SpO2: 100% 100%    Intake/Output Summary (Last 24 hours) at 07/19/2020 0954 Last data filed at 07/19/2020 0600 Gross per 24 hour  Intake 1200 ml  Output --  Net 1200 ml     Physical Exam   Cardiac: RRR without murmurs, S1S2 heard, no peripheral edema  Pulm: clear to auscultation bilaterally, normal RR and effort noted  Abdomen: soft, mild epigastric tenderness, with active bowel sounds. No guarding or palpable hepatosplenomegaly  Skin; warm and dry, no jaundice  Recent Labs:  CBC Latest Ref Rng & Units 07/19/2020 07/18/2020 07/17/2020  WBC 4.0 - 10.5 K/uL 10.7(H) 9.9 9.0  Hemoglobin 12.0 - 15.0 g/dL 7.6(L) 7.2(L) 7.3(L)  Hematocrit 36 - 46 % 27.5(L) 25.4(L) 26.1(L)  Platelets 150 - 400 K/uL 474(H) 478(H) 518(H)    Recent Labs  Lab 07/16/20 0600  INR 1.2   CMP Latest Ref Rng & Units 07/19/2020 07/18/2020 07/17/2020  Glucose 70 - 99 mg/dL 100(H) 102(H) 101(H)  BUN 6 - 20 mg/dL 14 12 11   Creatinine 0.44 - 1.00 mg/dL 0.78 0.82 0.78  Sodium 135 - 145 mmol/L 138 138 137  Potassium 3.5 - 5.1 mmol/L 3.8 3.6 3.7  Chloride 98 - 111 mmol/L 106 105 105  CO2 22 - 32 mmol/L 25 25 23   Calcium  8.9 - 10.3 mg/dL 8.5(L) 8.4(L) 8.5(L)  Total Protein 6.5 - 8.1 g/dL - - -  Total Bilirubin 0.3 - 1.2 mg/dL - - -  Alkaline Phos 38 - 126 U/L - - -  AST 15 - 41 U/L - - -  ALT 0 - 44 U/L - - -     Radiologic studies: CT chest report was reviewed  Assessment & Plan  Assessment: Years of iron deficiency anemia with negative endoscopic work-up. 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 Further PRBC transfusion as needed. Patient will need close outpatient follow-up with hematology.  Copy that provider my office note from last week. We will sign off, call as needed.   [name] Office: [phone]

## 2020-07-19 NOTE — Plan of Care (Signed)
  Problem: Education: Goal: Knowledge of General Education information will improve Description: Including pain rating scale, medication(s)/side effects and non-pharmacologic comfort measures Outcome: Progressing   Problem: Health Behavior/Discharge Planning: Goal: Ability to manage health-related needs will improve Outcome: Progressing   Problem: Clinical Measurements: Goal: Ability to maintain clinical measurements within normal limits will improve Outcome: Progressing Goal: Will remain free from infection Outcome: Progressing Goal: Diagnostic test results will improve Outcome: Progressing Goal: Respiratory complications will improve Outcome: Progressing Goal: Cardiovascular complication will be avoided Outcome: Progressing   Problem: Nutrition: Goal: Adequate nutrition will be maintained Outcome: Progressing   Problem: Coping: Goal: Level of anxiety will decrease Outcome: Progressing   Problem: Elimination: Goal: Will not experience complications related to bowel motility Outcome: Progressing   Problem: Pain Managment: Goal: General experience of comfort will improve Outcome: Progressing   Problem: Safety: Goal: Ability to remain free from injury will improve Outcome: Progressing   

## 2020-07-19 NOTE — Progress Notes (Signed)
PROGRESS NOTE  Adiya Selmer SWF:[id number] DOB: 1971-09-06 DOA: 07/15/2020 PCP: Kerin Perna, NP  HPI/Recap of past 24 hours: HPI from Dr Vickki Hearing [name] is a 49 y.o. female with a history of iron-deficiency anemia and migraines who presented to the ED with lightheadedness and shortness of breath with exertion that has been constant and worsening since June 2021, now severe, consistent with prior episodes where she required transfusions. She has been getting iron infusions regularly, but none since May 2021 due to transportation issues and having covid-19. She denies bleeding of any kind. She was evaluated by GI in 09/2019 with EGD, colonoscopy, capsule endoscopy without bleeding source found (large hiatal hernia noted). In the ED, CBC confirms hgb 5.9.  2u PRBCs ordered and transfused. GI consulted as well.     Today, patient continues to complain of intermittent epigastric discomfort, denies any new complaints.      Assessment/Plan: Principal Problem:   Symptomatic anemia Active Problems:   PTSD (post-traumatic stress disorder)   Migraine   Iron deficiency anemia due to chronic blood loss   Gastroesophageal reflux disease without esophagitis   Paraesophageal hiatal hernia   Obesity, Class III, BMI 40-49.9 (morbid obesity) ([place])   Occult blood positive stool   Acute on chronic blood loss anemia   Acute on chronic symptomatic iron deficiency anemia Large hiatal hernia, ??Cameron's erosion Unknown etiology, FOBT positive without obvious active GI bleed Hemoglobin 5.9 on admission Anemia panel showed iron 18, sats 4, ferritin 11 S/P 2 units of PRBC, transfused another 1U of PRBC on 07/19/20 prior to surgery IV Feraheme on 07/16/2020 Recent work-up, colonoscopy/EGD done 09/2019 unremarkable, small bowel capsule endoscopy done 04/2020 was also negative GI consulted, recommend general surgery consult for possible hiatal hernia repair. No plans for any further endoscopic  evaluation at this time CT abdomen/pelvis showed large hiatal hernia with gastric volvulus, no evidence of obstruction General surgery consulted, planning for possible hiatal hernia repair on 07/20/2020 Continue p.o. iron supplementation Continue PPI daily, H2 blocker at nighttime Monitor CBC closely  Morbid obesity Lifestyle modification advised       Malnutrition Type:      Malnutrition Characteristics:      Nutrition Interventions:       Estimated body mass index is 42.29 kg/m as calculated from the following:   Height as of this encounter: 5\' 7"  (1.702 m).   Weight as of this encounter: 122.5 kg.     Code Status: Full  Family Communication: Discussed extensively with patient  Disposition Plan: Status is: Inpatient  The patient will require care spanning > 2 midnights and should be moved to inpatient because: Inpatient level of care appropriate due to severity of illness  Dispo: The patient is from: Home              Anticipated d/c is to: Home              Anticipated d/c date is: 3 days              Patient currently is not medically stable to d/c.    Consultants:  GI  General surgery  Procedures:  None  Antimicrobials:  None  DVT prophylaxis: SCDs   Objective: Vitals:   07/18/20 0523 07/18/20 1438 07/18/20 2145 07/19/20 0459  BP: 131/76 (!) 110/58 123/62 131/65  Pulse: 85 89 80 78  Resp: 19 17 17 16   Temp: 98.8 F (37.1 C) 98.2 F (36.8 C) 98.5 F (36.9 C) 98.1 F (36.7  C)  TempSrc:  Oral Oral Oral  SpO2: 96% 99% 100% 100%  Weight:      Height:        Intake/Output Summary (Last 24 hours) at 07/19/2020 1320 Last data filed at 07/19/2020 1100 Gross per 24 hour  Intake 1320 ml  Output --  Net 1320 ml   Filed Weights   07/15/20 0948  Weight: 122.5 kg    Exam:  General: NAD   Cardiovascular: S1, S2 present  Respiratory: CTAB  Abdomen: Soft, nontender, nondistended, bowel sounds present  Musculoskeletal: No  bilateral pedal edema noted  Skin: Normal  Psychiatry: Normal mood    Data Reviewed: CBC: Recent Labs  Lab 07/13/20 1623 07/13/20 1623 07/15/20 1149 07/16/20 0555 07/17/20 0323 07/18/20 0307 07/19/20 0358  WBC 10.3   < > 9.7 10.3 9.0 9.9 10.7*  NEUTROABS 7.7  --   --   --  6.5 7.1 7.7  HGB 5.9 Repeated and verified X2.*   < > 5.9* 7.2* 7.3* 7.2* 7.6*  HCT 20.2 Repeated and verified X2.*   < > 22.6* 25.3* 26.1* 25.4* 27.5*  MCV 60.6 Repeated and verified X2.*   < > 66.5* 70.1* 70.7* 70.8* 72.9*  PLT 742.0*   < > 638* 520* 518* 478* 474*   < > = values in this interval not displayed.   Basic Metabolic Panel: Recent Labs  Lab 07/15/20 1012 07/17/20 0323 07/18/20 0307 07/19/20 0358  NA 137 137 138 138  K 4.3 3.7 3.6 3.8  CL 103 105 105 106  CO2 23 23 25 25   GLUCOSE 112* 101* 102* 100*  BUN 20 11 12 14   CREATININE 0.84 0.78 0.82 0.78  CALCIUM 9.0 8.5* 8.4* 8.5*   GFR: Estimated Creatinine Clearance: 114.2 mL/min (by C-G formula based on SCr of 0.78 mg/dL). Liver Function Tests: Recent Labs  Lab 07/15/20 1012  AST 12*  ALT 9  ALKPHOS 77  BILITOT 0.4  PROT 8.1  ALBUMIN 3.6   No results for input(s): LIPASE, AMYLASE in the last 168 hours. No results for input(s): AMMONIA in the last 168 hours. Coagulation Profile: Recent Labs  Lab 07/16/20 0600  INR 1.2   Cardiac Enzymes: No results for input(s): CKTOTAL, CKMB, CKMBINDEX, TROPONINI in the last 168 hours. BNP (last 3 results) No results for input(s): PROBNP in the last 8760 hours. HbA1C: No results for input(s): HGBA1C in the last 72 hours. CBG: No results for input(s): GLUCAP in the last 168 hours. Lipid Profile: No results for input(s): CHOL, HDL, LDLCALC, TRIG, CHOLHDL, LDLDIRECT in the last 72 hours. Thyroid Function Tests: No results for input(s): TSH, T4TOTAL, FREET4, T3FREE, THYROIDAB in the last 72 hours. Anemia Panel: No results for input(s): VITAMINB12, FOLATE, FERRITIN, TIBC, IRON,  RETICCTPCT in the last 72 hours. Urine analysis:    Component Value Date/Time   COLORURINE YELLOW 11/29/2018 0215   APPEARANCEUR HAZY (A) 11/29/2018 0215   LABSPEC 1.025 11/29/2018 0215   PHURINE 5.0 11/29/2018 0215   GLUCOSEU NEGATIVE 11/29/2018 0215   HGBUR SMALL (A) 11/29/2018 0215   BILIRUBINUR NEGATIVE 11/29/2018 0215   KETONESUR NEGATIVE 11/29/2018 0215   PROTEINUR NEGATIVE 11/29/2018 0215   UROBILINOGEN 0.2 07/10/2015 1115   NITRITE NEGATIVE 11/29/2018 0215   LEUKOCYTESUR NEGATIVE 11/29/2018 0215   Sepsis Labs: @LABRCNTIP (procalcitonin:4,lacticidven:4)  ) Recent Results (from the past 240 hour(s))  SARS Coronavirus 2 by RT PCR (hospital order, performed in [facility] hospital lab) Nasopharyngeal Nasopharyngeal Swab     Status: None  Collection Time: 07/15/20  2:56 PM   Specimen: Nasopharyngeal Swab  Result Value Ref Range Status   SARS Coronavirus 2 NEGATIVE NEGATIVE Final    Comment: (NOTE) SARS-CoV-2 target nucleic acids are NOT DETECTED.  The SARS-CoV-2 RNA is generally detectable in upper and lower respiratory specimens during the acute phase of infection. The lowest concentration of SARS-CoV-2 viral copies this assay can detect is 250 copies / mL. A negative result does not preclude SARS-CoV-2 infection and should not be used as the sole basis for treatment or other patient management decisions.  A negative result may occur with improper specimen collection / handling, submission of specimen other than nasopharyngeal swab, presence of viral mutation(s) within the areas targeted by this assay, and inadequate number of viral copies (<250 copies / mL). A negative result must be combined with clinical observations, patient history, and epidemiological information.  Fact Sheet for Patients:   StrictlyIdeas.no  Fact Sheet for Healthcare Providers: BankingDealers.co.za  This test is not yet approved or  cleared by  the [place] FDA and has been authorized for detection and/or diagnosis of SARS-CoV-2 by FDA under an Emergency Use Authorization (EUA).  This EUA will remain in effect (meaning this test can be used) for the duration of the COVID-19 declaration under Section 564(b)(1) of the Act, 21 U.S.C. section 360bbb-3(b)(1), unless the authorization is terminated or revoked sooner.  Performed at [facility], [place] [address], [place], [place] [address]       Studies: No results found.  Scheduled Meds: . sodium chloride   Intravenous Once  . famotidine  20 mg Oral QHS  . folic acid  1 mg Oral Daily  . pantoprazole  40 mg Oral QAC breakfast  . sodium chloride flush  3 mL Intravenous Q12H    Continuous Infusions: . sodium chloride    . [START ON 07/20/2020]  ceFAZolin (ANCEF) IV    . [START ON 07/20/2020] dextrose 5 % and 0.9 % NaCl with KCl 40 mEq/L       LOS: 3 days     Alma Friendly, MD [facility]  If 7PM-7AM, please contact night-coverage [email or web address] 07/19/2020, 1:20 PM

## 2020-07-19 NOTE — Progress Notes (Signed)
   Subjective/Chief Complaint: Pt with  No issues at this time.    Objective: Vital signs in last 24 hours: Temp:  [98.1 F (36.7 C)-98.5 F (36.9 C)] 98.1 F (36.7 C) (08/23 0459) Pulse Rate:  [78-89] 78 (08/23 0459) Resp:  [16-17] 16 (08/23 0459) BP: (110-131)/(58-65) 131/65 (08/23 0459) SpO2:  [99 %-100 %] 100 % (08/23 0459) Last BM Date: 07/18/20  Intake/Output from previous day: 08/22 0701 - 08/23 0700 In: 1560 [P.O.:1560] Out: -  Intake/Output this shift: No intake/output data recorded.  PE:  Constitutional: No acute distress, conversant, appears states age. Eyes: Anicteric sclerae, moist conjunctiva, no lid lag Lungs: Clear to auscultation bilaterally, normal respiratory effort CV: regular rate and rhythm, no murmurs, no peripheral edema, pedal pulses 2+ GI: Soft, no masses or hepatosplenomegaly, non-tender to palpation Skin: No rashes, palpation reveals normal turgor Psychiatric: appropriate judgment and insight, oriented to person, place, and time   Lab Results:  Recent Labs    07/18/20 0307 07/19/20 0358  WBC 9.9 10.7*  HGB 7.2* 7.6*  HCT 25.4* 27.5*  PLT 478* 474*   BMET Recent Labs    07/18/20 0307 07/19/20 0358  NA 138 138  K 3.6 3.8  CL 105 106  CO2 25 25  GLUCOSE 102* 100*  BUN 12 14  CREATININE 0.82 0.78  CALCIUM 8.4* 8.5*   Assessment/Plan: A/P 49 yo female with symptomatic anemia and findings of large hiatal hernia with concern for volvulus -trx 1U PRBC today -plan OR tomorrow for HHR and fundoplication -All risks and benefits were discussed with the patient to generally include: infection, bleeding, pneumothorax, damage to surrounding structures, and possible recurrence. Alternatives were offered and described.  All questions were answered and the patient voiced understanding of the procedure and wishes to proceed at this point with surgery    LOS: 3 days    Ralene Ok 07/19/2020

## 2020-07-19 NOTE — Telephone Encounter (Signed)
Stat CBC was ordered 07-13-2020. Pt was advised to go to the ER. Upon reviewing, pt never reported to ER and nothing found in care every-where

## 2020-07-19 NOTE — Progress Notes (Signed)
Patient tolerating blood transfusion well.

## 2020-07-19 NOTE — Care Management Important Message (Signed)
Important Message  Patient Details IM Letter given to the Patient Name: Christina Mcbride MRN: [id number] Date of Birth: 1971-05-22   Medicare Important Message Given:  Yes     Kerin Salen 07/19/2020, 11:13 AM

## 2020-07-20 ENCOUNTER — Observation Stay (HOSPITAL_COMMUNITY): Payer: Medicare HMO | Admitting: Certified Registered Nurse Anesthetist

## 2020-07-20 ENCOUNTER — Encounter (HOSPITAL_COMMUNITY)
Admission: EM | Disposition: A | Discharge status: Home / Self Care | Payer: Self-pay | Source: Home / Self Care | Attending: Emergency Medicine

## 2020-07-20 DIAGNOSIS — K219 Gastro-esophageal reflux disease without esophagitis: Secondary | ICD-10-CM | POA: Diagnosis not present

## 2020-07-20 DIAGNOSIS — D500 Iron deficiency anemia secondary to blood loss (chronic): Secondary | ICD-10-CM | POA: Diagnosis not present

## 2020-07-20 DIAGNOSIS — D62 Acute posthemorrhagic anemia: Secondary | ICD-10-CM | POA: Diagnosis not present

## 2020-07-20 DIAGNOSIS — D649 Anemia, unspecified: Secondary | ICD-10-CM | POA: Diagnosis not present

## 2020-07-20 DIAGNOSIS — Z20822 Contact with and (suspected) exposure to covid-19: Secondary | ICD-10-CM | POA: Diagnosis not present

## 2020-07-20 DIAGNOSIS — R0602 Shortness of breath: Secondary | ICD-10-CM | POA: Diagnosis not present

## 2020-07-20 DIAGNOSIS — K449 Diaphragmatic hernia without obstruction or gangrene: Secondary | ICD-10-CM | POA: Diagnosis not present

## 2020-07-20 LAB — BASIC METABOLIC PANEL
Anion gap: 8 (ref 5–15)
BUN: 13 mg/dL (ref 6–20)
CO2: 24 mmol/L (ref 22–32)
Calcium: 8.3 mg/dL — ABNORMAL LOW (ref 8.9–10.3)
Chloride: 103 mmol/L (ref 98–111)
Creatinine, Ser: 0.81 mg/dL (ref 0.44–1.00)
GFR calc Af Amer: >60 mL/min (ref >60)
GFR calc non Af Amer: >60 mL/min (ref >60)
Glucose, Bld: 116 mg/dL — ABNORMAL HIGH (ref 70–99)
Potassium: 3.8 mmol/L (ref 3.5–5.1)
Sodium: 135 mmol/L (ref 135–145)

## 2020-07-20 LAB — CBC WITH DIFFERENTIAL/PLATELET
Abs Immature Granulocytes: 0.18 10*3/uL — ABNORMAL HIGH (ref 0.00–0.07)
Basophils Absolute: 0.1 10*3/uL (ref 0.0–0.1)
Basophils Relative: 1 %
Eosinophils Absolute: 0.2 10*3/uL (ref 0.0–0.5)
Eosinophils Relative: 2 %
HCT: 31.9 % — ABNORMAL LOW (ref 36.0–46.0)
Hemoglobin: 9.3 g/dL — ABNORMAL LOW (ref 12.0–15.0)
Immature Granulocytes: 2 %
Lymphocytes Relative: 16 %
Lymphs Abs: 1.7 10*3/uL (ref 0.7–4.0)
MCH: 21.2 pg — ABNORMAL LOW (ref 26.0–34.0)
MCHC: 29.2 g/dL — ABNORMAL LOW (ref 30.0–36.0)
MCV: 72.7 fL — ABNORMAL LOW (ref 80.0–100.0)
Monocytes Absolute: 0.7 10*3/uL (ref 0.1–1.0)
Monocytes Relative: 7 %
Neutro Abs: 7.7 10*3/uL (ref 1.7–7.7)
Neutrophils Relative %: 72 %
Platelets: 448 10*3/uL — ABNORMAL HIGH (ref 150–400)
RBC: 4.39 MIL/uL (ref 3.87–5.11)
RDW: 27.8 % — ABNORMAL HIGH (ref 11.5–15.5)
WBC: 10.5 10*3/uL (ref 4.0–10.5)
nRBC: 0.8 % — ABNORMAL HIGH (ref 0.0–0.2)

## 2020-07-20 LAB — SURGICAL PCR SCREEN
MRSA, PCR: NEGATIVE
Staphylococcus aureus: NEGATIVE

## 2020-07-20 SURGERY — FUNDOPLICATION, NISSEN, ROBOT-ASSISTED, LAPAROSCOPIC
Anesthesia: General | Site: Abdomen

## 2020-07-20 MED ORDER — SUGAMMADEX SODIUM 500 MG/5ML IV SOLN
INTRAVENOUS | Status: AC
  Filled 2020-07-20: qty 5

## 2020-07-20 MED ORDER — LABETALOL HCL 5 MG/ML IV SOLN: 5.0000 mg | INTRAVENOUS | Status: DC | PRN

## 2020-07-20 MED ORDER — HYDROMORPHONE HCL 1 MG/ML IJ SOLN
1.0000 mg | INTRAMUSCULAR | Status: DC | PRN
  Administered 2020-07-20: 1 mg via INTRAVENOUS
  Filled 2020-07-20: qty 1

## 2020-07-20 MED ORDER — DEXAMETHASONE SODIUM PHOSPHATE 10 MG/ML IJ SOLN
INTRAMUSCULAR | Status: AC
  Filled 2020-07-20: qty 1

## 2020-07-20 MED ORDER — HYDROMORPHONE HCL 1 MG/ML IJ SOLN
0.2500 mg | INTRAMUSCULAR | Status: DC | PRN
  Administered 2020-07-20: 0.5 mg via INTRAVENOUS

## 2020-07-20 MED ORDER — PHENYLEPHRINE 40 MCG/ML (10ML) SYRINGE FOR IV PUSH (FOR BLOOD PRESSURE SUPPORT)
PREFILLED_SYRINGE | INTRAVENOUS | Status: DC | PRN
  Administered 2020-07-20: 80 ug via INTRAVENOUS
  Administered 2020-07-20: 120 ug via INTRAVENOUS

## 2020-07-20 MED ORDER — ONDANSETRON HCL 4 MG/2ML IJ SOLN
INTRAMUSCULAR | Status: AC
  Filled 2020-07-20: qty 2

## 2020-07-20 MED ORDER — BUPIVACAINE-EPINEPHRINE (PF) 0.25% -1:200000 IJ SOLN
INTRAMUSCULAR | Status: AC
  Filled 2020-07-20: qty 30

## 2020-07-20 MED ORDER — HYDROMORPHONE HCL 1 MG/ML IJ SOLN
INTRAMUSCULAR | Status: AC
  Administered 2020-07-20: 0.5 mg via INTRAVENOUS
  Filled 2020-07-20: qty 1

## 2020-07-20 MED ORDER — HYDROMORPHONE HCL 2 MG/ML IJ SOLN
INTRAMUSCULAR | Status: AC
  Filled 2020-07-20: qty 1

## 2020-07-20 MED ORDER — FENTANYL CITRATE (PF) 100 MCG/2ML IJ SOLN
INTRAMUSCULAR | Status: DC | PRN
Start: 2020-07-20 — End: 2020-07-20
  Administered 2020-07-20 (×5): 50 ug via INTRAVENOUS

## 2020-07-20 MED ORDER — PROMETHAZINE HCL 25 MG/ML IJ SOLN: 6.2500 mg | INTRAMUSCULAR | Status: DC | PRN

## 2020-07-20 MED ORDER — MIDAZOLAM HCL 5 MG/5ML IJ SOLN
INTRAMUSCULAR | Status: DC | PRN
  Administered 2020-07-20: 2 mg via INTRAVENOUS

## 2020-07-20 MED ORDER — LACTATED RINGERS IV SOLN: INTRAVENOUS | Status: DC | PRN

## 2020-07-20 MED ORDER — HYDROMORPHONE HCL 1 MG/ML IJ SOLN
INTRAMUSCULAR | Status: DC | PRN
  Administered 2020-07-20 (×2): .5 mg via INTRAVENOUS

## 2020-07-20 MED ORDER — KETOROLAC TROMETHAMINE 30 MG/ML IJ SOLN
INTRAMUSCULAR | Status: AC
  Filled 2020-07-20: qty 1

## 2020-07-20 MED ORDER — DEXAMETHASONE SODIUM PHOSPHATE 4 MG/ML IJ SOLN
INTRAMUSCULAR | Status: DC | PRN
  Administered 2020-07-20: 8 mg via INTRAVENOUS

## 2020-07-20 MED ORDER — PHENOL 1.4 % MT LIQD
1.0000 | OROMUCOSAL | Status: DC | PRN
  Filled 2020-07-20: qty 177

## 2020-07-20 MED ORDER — ROCURONIUM BROMIDE 10 MG/ML (PF) SYRINGE
PREFILLED_SYRINGE | INTRAVENOUS | Status: AC
  Filled 2020-07-20: qty 10

## 2020-07-20 MED ORDER — BUPIVACAINE-EPINEPHRINE 0.25% -1:200000 IJ SOLN
INTRAMUSCULAR | Status: DC | PRN
  Administered 2020-07-20: 27 mL

## 2020-07-20 MED ORDER — LIDOCAINE 2% (20 MG/ML) 5 ML SYRINGE
INTRAMUSCULAR | Status: DC | PRN
  Administered 2020-07-20: 40 mg via INTRAVENOUS

## 2020-07-20 MED ORDER — LACTATED RINGERS IR SOLN
Status: DC | PRN
  Administered 2020-07-20: 1000 mL

## 2020-07-20 MED ORDER — BUPIVACAINE LIPOSOME 1.3 % IJ SUSP
20.0000 mL | Freq: Once | INTRAMUSCULAR | Status: AC
  Administered 2020-07-20: 13 mL
  Filled 2020-07-20: qty 20

## 2020-07-20 MED ORDER — MEPERIDINE HCL 50 MG/ML IJ SOLN: 6.2500 mg | INTRAMUSCULAR | Status: DC | PRN

## 2020-07-20 MED ORDER — LIDOCAINE 2% (20 MG/ML) 5 ML SYRINGE
INTRAMUSCULAR | Status: DC | PRN
  Administered 2020-07-20: 1.5 mg/kg/h via INTRAVENOUS

## 2020-07-20 MED ORDER — LIDOCAINE HCL 2 % IJ SOLN
INTRAMUSCULAR | Status: AC
  Filled 2020-07-20: qty 20

## 2020-07-20 MED ORDER — KETOROLAC TROMETHAMINE 30 MG/ML IJ SOLN
30.0000 mg | Freq: Once | INTRAMUSCULAR | Status: AC | PRN
  Administered 2020-07-20: 30 mg via INTRAVENOUS

## 2020-07-20 MED ORDER — SUGAMMADEX SODIUM 500 MG/5ML IV SOLN
INTRAVENOUS | Status: DC | PRN
  Administered 2020-07-20: 245 mg via INTRAVENOUS

## 2020-07-20 MED ORDER — ROCURONIUM BROMIDE 10 MG/ML (PF) SYRINGE
PREFILLED_SYRINGE | INTRAVENOUS | Status: DC | PRN
  Administered 2020-07-20 (×2): 10 mg via INTRAVENOUS
  Administered 2020-07-20: 60 mg via INTRAVENOUS
  Administered 2020-07-20: 10 mg via INTRAVENOUS

## 2020-07-20 MED ORDER — PROPOFOL 10 MG/ML IV BOLUS
INTRAVENOUS | Status: DC | PRN
  Administered 2020-07-20: 200 mg via INTRAVENOUS

## 2020-07-20 MED ORDER — LABETALOL HCL 5 MG/ML IV SOLN
INTRAVENOUS | Status: AC
  Administered 2020-07-20: 5 mg via INTRAVENOUS
  Filled 2020-07-20: qty 4

## 2020-07-20 MED ORDER — 0.9 % SODIUM CHLORIDE (POUR BTL) OPTIME
TOPICAL | Status: DC | PRN
  Administered 2020-07-20: 1000 mL

## 2020-07-20 MED ORDER — MIDAZOLAM HCL 2 MG/2ML IJ SOLN
INTRAMUSCULAR | Status: AC
  Filled 2020-07-20: qty 2

## 2020-07-20 MED ORDER — KETAMINE HCL 50 MG/ML IJ SOLN
INTRAMUSCULAR | Status: DC | PRN
  Administered 2020-07-20: 30 mg via INTRAMUSCULAR

## 2020-07-20 MED ORDER — LIP MEDEX EX OINT
TOPICAL_OINTMENT | CUTANEOUS | Status: AC
  Administered 2020-07-21: 1 via TOPICAL
  Filled 2020-07-20: qty 7

## 2020-07-20 MED ORDER — ONDANSETRON HCL 4 MG/2ML IJ SOLN
INTRAMUSCULAR | Status: DC | PRN
  Administered 2020-07-20: 4 mg via INTRAVENOUS

## 2020-07-20 MED ORDER — KETOROLAC TROMETHAMINE 30 MG/ML IJ SOLN
30.0000 mg | Freq: Four times a day (QID) | INTRAMUSCULAR | Status: DC | PRN
  Administered 2020-07-20 – 2020-07-21 (×2): 30 mg via INTRAVENOUS
  Filled 2020-07-20 (×2): qty 1

## 2020-07-20 MED ORDER — PROPOFOL 10 MG/ML IV BOLUS
INTRAVENOUS | Status: AC
  Filled 2020-07-20: qty 20

## 2020-07-20 MED ORDER — FENTANYL CITRATE (PF) 250 MCG/5ML IJ SOLN
INTRAMUSCULAR | Status: AC
  Filled 2020-07-20: qty 5

## 2020-07-20 MED ORDER — LIDOCAINE 2% (20 MG/ML) 5 ML SYRINGE
INTRAMUSCULAR | Status: AC
  Filled 2020-07-20: qty 5

## 2020-07-20 SURGICAL SUPPLY — 60 items
APPLIER CLIP 5 13 M/L LIGAMAX5 (MISCELLANEOUS)
APPLIER CLIP ROT 10 11.4 M/L (STAPLE)
BLADE SURG SZ11 CARB STEEL (BLADE) ×3 IMPLANT
CHLORAPREP W/TINT 26 (MISCELLANEOUS) ×3 IMPLANT
CLIP APPLIE 5 13 M/L LIGAMAX5 (MISCELLANEOUS) IMPLANT
CLIP APPLIE ROT 10 11.4 M/L (STAPLE) IMPLANT
CLIP VESOLOCK LG 6/CT PURPLE (CLIP) IMPLANT
CLIP VESOLOCK MED LG 6/CT (CLIP) IMPLANT
COVER SURGICAL LIGHT HANDLE (MISCELLANEOUS) ×3 IMPLANT
COVER TIP SHEARS 8 DVNC (MISCELLANEOUS) ×1 IMPLANT
COVER TIP SHEARS 8MM DA VINCI (MISCELLANEOUS) ×2
COVER WAND RF STERILE (DRAPES) ×3 IMPLANT
DECANTER SPIKE VIAL GLASS SM (MISCELLANEOUS) ×3 IMPLANT
DERMABOND ADVANCED (GAUZE/BANDAGES/DRESSINGS) ×2
DERMABOND ADVANCED .7 DNX12 (GAUZE/BANDAGES/DRESSINGS) ×1 IMPLANT
DRAIN PENROSE 0.5X18 (DRAIN) ×3 IMPLANT
DRAPE ARM DVNC X/XI (DISPOSABLE) ×4 IMPLANT
DRAPE COLUMN DVNC XI (DISPOSABLE) ×1 IMPLANT
DRAPE DA VINCI XI ARM (DISPOSABLE) ×8
DRAPE DA VINCI XI COLUMN (DISPOSABLE) ×2
ELECT REM PT RETURN 15FT ADLT (MISCELLANEOUS) ×3 IMPLANT
ENDOLOOP SUT PDS II  0 18 (SUTURE)
ENDOLOOP SUT PDS II 0 18 (SUTURE) IMPLANT
GAUZE 4X4 16PLY RFD (DISPOSABLE) ×3 IMPLANT
GLOVE BIO SURGEON STRL SZ7.5 (GLOVE) ×6 IMPLANT
GOWN STRL REUS W/TWL XL LVL3 (GOWN DISPOSABLE) ×12 IMPLANT
KIT BASIN OR (CUSTOM PROCEDURE TRAY) ×3 IMPLANT
KIT TURNOVER KIT A (KITS) ×3 IMPLANT
MARKER SKIN DUAL TIP RULER LAB (MISCELLANEOUS) ×3 IMPLANT
MESH BIO-A 7X10 SYN MAT (Mesh General) ×3 IMPLANT
NEEDLE HYPO 22GX1.5 SAFETY (NEEDLE) ×3 IMPLANT
NEEDLE INSUFFLATION 14GA 120MM (NEEDLE) ×3 IMPLANT
OBTURATOR OPTICAL STANDARD 8MM (TROCAR)
OBTURATOR OPTICAL STND 8 DVNC (TROCAR)
OBTURATOR OPTICALSTD 8 DVNC (TROCAR) IMPLANT
PACK CARDIOVASCULAR III (CUSTOM PROCEDURE TRAY) ×3 IMPLANT
PAD POSITIONING PINK XL (MISCELLANEOUS) ×3 IMPLANT
PENCIL SMOKE EVACUATOR (MISCELLANEOUS) IMPLANT
SCISSORS LAP 5X35 DISP (ENDOMECHANICALS) IMPLANT
SEAL CANN UNIV 5-8 DVNC XI (MISCELLANEOUS) ×4 IMPLANT
SEAL XI 5MM-8MM UNIVERSAL (MISCELLANEOUS) ×8
SEALER VESSEL DA VINCI XI (MISCELLANEOUS) ×2
SEALER VESSEL EXT DVNC XI (MISCELLANEOUS) ×1 IMPLANT
SET IRRIG TUBING LAPAROSCOPIC (IRRIGATION / IRRIGATOR) ×3 IMPLANT
SOL ANTI FOG 6CC (MISCELLANEOUS) ×1 IMPLANT
SOLUTION ANTI FOG 6CC (MISCELLANEOUS) ×2
SOLUTION ELECTROLUBE (MISCELLANEOUS) ×3 IMPLANT
SUT DVC VLOC 180 2-0 12IN GS21 (SUTURE)
SUT ETHIBOND 0 36 GRN (SUTURE) ×6 IMPLANT
SUT MNCRL AB 4-0 PS2 18 (SUTURE) ×3 IMPLANT
SUT SILK 0 SH 30 (SUTURE) ×9 IMPLANT
SUT SILK 2 0 SH (SUTURE) ×6 IMPLANT
SUT VLOC 180 ABS0 18IN GS21 (SUTURE) IMPLANT
SUTURE DVC VL 180 2-0 12INGS21 (SUTURE) IMPLANT
SYR 20ML LL LF (SYRINGE) ×3 IMPLANT
TOWEL OR 17X26 10 PK STRL BLUE (TOWEL DISPOSABLE) ×3 IMPLANT
TOWEL OR NON WOVEN STRL DISP B (DISPOSABLE) ×3 IMPLANT
TRAY FOLEY MTR SLVR 14FR STAT (SET/KITS/TRAYS/PACK) ×3 IMPLANT
TROCAR ADV FIXATION 5X100MM (TROCAR) ×3 IMPLANT
TUBING INSUFFLATION 10FT LAP (TUBING) ×3 IMPLANT

## 2020-07-20 NOTE — Anesthesia Procedure Notes (Signed)
Procedure Name: Intubation Date/Time: 07/20/2020 1:45 PM Performed by: Deliah Boston, CRNA Pre-anesthesia Checklist: Patient identified, Emergency Drugs available, Suction available and Patient being monitored Patient Re-evaluated:Patient Re-evaluated prior to induction Oxygen Delivery Method: Circle system utilized Preoxygenation: Pre-oxygenation with 100% oxygen Induction Type: IV induction Ventilation: Mask ventilation without difficulty Laryngoscope Size: Mac and 4 Grade View: Grade I Tube type: Oral Tube size: 7.5 mm Number of attempts: 1 Airway Equipment and Method: Stylet and Oral airway Placement Confirmation: ETT inserted through vocal cords under direct vision,  positive ETCO2 and breath sounds checked- equal and bilateral Secured at: 22 cm Tube secured with: Tape Dental Injury: Teeth and Oropharynx as per pre-operative assessment

## 2020-07-20 NOTE — Anesthesia Postprocedure Evaluation (Signed)
Anesthesia Post Note  Patient: Christina Mcbride  Procedure(s) Performed: XI ROBOTIC ASSISTED LAPAROSCOPIC NISSEN FUNDOPLICATION WITH MESH (N/A Abdomen)     Patient location during evaluation: PACU Anesthesia Type: General Level of consciousness: awake and alert Pain management: pain level controlled Vital Signs Assessment: post-procedure vital signs reviewed and stable Respiratory status: spontaneous breathing, nonlabored ventilation, respiratory function stable and patient connected to nasal cannula oxygen Cardiovascular status: blood pressure returned to baseline and stable Postop Assessment: no apparent nausea or vomiting Anesthetic complications: no   No complications documented.  Last Vitals:  Vitals:   07/20/20 1645 07/20/20 1700  BP: (!) 147/97 (!) 150/92  Pulse: 97 90  Resp: 15 (!) 9  Temp:    SpO2: 97% 94%    Last Pain:  Vitals:   07/20/20 1700  TempSrc:   PainSc: [place]

## 2020-07-20 NOTE — Discharge Instructions (Signed)
EATING AFTER YOUR ESOPHAGEAL SURGERY (Stomach Fundoplication, Hiatal Hernia repair, Achalasia surgery, etc)  ######################################################################  EAT Start with a pureed / full liquid diet (see below) Gradually transition to a high fiber diet with a fiber supplement over the next month after discharge.    WALK Walk an hour a day.  Control your pain to do that.    CONTROL PAIN Control pain so that you can walk, sleep, tolerate sneezing/coughing, go up/down stairs.  HAVE A BOWEL MOVEMENT DAILY Keep your bowels regular to avoid problems.  OK to try a laxative to override constipation.  OK to use an antidairrheal to slow down diarrhea.  Call if not better after 2 tries  CALL IF YOU HAVE PROBLEMS/CONCERNS Call if you are still struggling despite following these instructions. Call if you have concerns not answered by these instructions  ######################################################################   After your esophageal surgery, expect some sticking with swallowing over the next 1-2 months.    If food sticks when you eat, it is called "dysphagia".  This is due to swelling around your esophagus at the wrap & hiatal diaphragm repair.  It will gradually ease off over the next few months.  To help you through this temporary phase, we start you out on a pureed (blenderized) diet.  Your first meal in the hospital was thin liquids.  You should have been given a pureed diet by the time you left the hospital.  We ask patients to stay on a pureed diet for the first 2-3 weeks to avoid anything getting "stuck" near your recent surgery.  Don't be alarmed if your ability to swallow doesn't progress according to this plan.  Everyone is different and some diets can advance more or less quickly.    It is often helpful to crush your medications or split them as they can sometimes stick, especially the first week or so.   Some BASIC RULES to follow  are:  Maintain an upright position whenever eating or drinking.  Take small bites - just a teaspoon size bite at a time.  Eat slowly.  It may also help to eat only one food at a time.  Consider nibbling through smaller, more frequent meals & avoid the urge to eat BIG meals  Do not push through feelings of fullness, nausea, or bloatedness  Do not mix solid foods and liquids in the same mouthful  Try not to "wash foods down" with large gulps of liquids.  Avoid carbonated (bubbly/fizzy) drinks.    Avoid foods that make you feel gassy or bloated.  Start with bland foods first.  Wait on trying greasy, fried, or spicy meals until you are tolerating more bland solids well.  Understand that it will be hard to burp and belch at first.  This gradually improves with time.  Expect to be more gassy/flatulent/bloated initially.  Walking will help your body manage it better.  Consider using medications for bloating that contain simethicone such as  Maalox or Gas-X   Consider crushing her medications, especially smaller pills.  The ability to swallow pills should get easier after a few weeks  Eat in a relaxed atmosphere & minimize distractions.  Avoid talking while eating.    Do not use straws.  Following each meal, sit in an upright position (90 degree angle) for 60 to 90 minutes.  Going for a short walk can help as well  If food does stick, don't panic.  Try to relax and let the food pass on its own.    Sipping WARM LIQUID such as strong hot black tea can also help slide it down.   Be gradual in changes & use common sense:  -If you easily tolerating a certain "level" of foods, advance to the next level gradually -If you are having trouble swallowing a particular food, then avoid it.   -If food is sticking when you advance your diet, go back to thinner previous diet (the lower LEVEL) for 1-2 days.  LEVEL 1 = PUREED DIET  Do for the first 2 WEEKS AFTER SURGERY  -Foods in this group are  pureed or blenderized to a smooth, mashed potato-like consistency.  -If necessary, the pureed foods can keep their shape with the addition of a thickening agent.   -Meat should be pureed to a smooth, pasty consistency.  Hot broth or gravy may be added to the pureed meat, approximately 1 oz. of liquid per 3 oz. serving of meat. -CAUTION:  If any foods do not puree into a smooth consistency, swallowing will be more difficult.  (For example, nuts or seeds sometimes do not blend well.)  Hot Foods Cold Foods  Pureed scrambled eggs and cheese Pureed cottage cheese  Baby cereals Thickened juices and nectars  Thinned cooked cereals (no lumps) Thickened milk or eggnog  Pureed French toast or pancakes Ensure  Mashed potatoes Ice cream  Pureed parsley, au gratin, scalloped potatoes, candied sweet potatoes Fruit or Italian ice, sherbet  Pureed buttered or alfredo noodles Plain yogurt  Pureed vegetables (no corn or peas) Instant breakfast  Pureed soups and creamed soups Smooth pudding, mousse, custard  Pureed scalloped apples Whipped gelatin  Gravies Sugar, syrup, honey, jelly  Sauces, cheese, tomato, barbecue, white, creamed Cream  Any baby food Creamer  Alcohol in moderation (not beer or champagne) Margarine  Coffee or tea Mayonnaise   Ketchup, mustard   Apple sauce   SAMPLE MENU:  PUREED DIET Breakfast Lunch Dinner   Orange juice, 1/2 cup  Cream of wheat, 1/2 cup  Pineapple juice, 1/2 cup  Pureed turkey, barley soup, 3/4 cup  Pureed Hawaiian chicken, 3 oz   Scrambled eggs, mashed or blended with cheese, 1/2 cup  Tea or coffee, 1 cup   Whole milk, 1 cup   Non-dairy creamer, 2 Tbsp.  Mashed potatoes, 1/2 cup  Pureed cooled broccoli, 1/2 cup  Apple sauce, 1/2 cup  Coffee or tea  Mashed potatoes, 1/2 cup  Pureed spinach, 1/2 cup  Frozen yogurt, 1/2 cup  Tea or coffee      LEVEL 2 = SOFT DIET  After your first 2 weeks, you can advance to a soft diet.   Keep on this  diet until everything goes down easily.  Hot Foods Cold Foods  White fish Cottage cheese  Stuffed fish Junior baby fruit  Baby food meals Semi thickened juices  Minced soft cooked, scrambled, poached eggs nectars  Souffle & omelets Ripe mashed bananas  Cooked cereals Canned fruit, pineapple sauce, milk  potatoes Milkshake  Buttered or Alfredo noodles Custard  Cooked cooled vegetable Puddings, including tapioca  Sherbet Yogurt  Vegetable soup or alphabet soup Fruit ice, Italian ice  Gravies Whipped gelatin  Sugar, syrup, honey, jelly Junior baby desserts  Sauces:  Cheese, creamed, barbecue, tomato, white Cream  Coffee or tea Margarine   SAMPLE MENU:  LEVEL 2 Breakfast Lunch Dinner   Orange juice, 1/2 cup  Oatmeal, 1/2 cup  Scrambled eggs with cheese, 1/2 cup  Decaffeinated tea, 1 cup  Whole milk, 1 cup    Non-dairy creamer, 2 Tbsp  Pineapple juice, 1/2 cup  Minced beef, 3 oz  Gravy, 2 Tbsp  Mashed potatoes, 1/2 cup  Minced fresh broccoli, 1/2 cup  Applesauce, 1/2 cup  Coffee, 1 cup  Turkey, barley soup, 3/4 cup  Minced Hawaiian chicken, 3 oz  Mashed potatoes, 1/2 cup  Cooked spinach, 1/2 cup  Frozen yogurt, 1/2 cup  Non-dairy creamer, 2 Tbsp      LEVEL 3 = CHOPPED DIET  -After all the foods in level 2 (soft diet) are passing through well you should advance up to more chopped foods.  -It is still important to cut these foods into small pieces and eat slowly.  Hot Foods Cold Foods  Poultry Cottage cheese  Chopped Swedish meatballs Yogurt  Meat salads (ground or flaked meat) Milk  Flaked fish (tuna) Milkshakes  Poached or scrambled eggs Soft, cold, dry cereal  Souffles and omelets Fruit juices or nectars  Cooked cereals Chopped canned fruit  Chopped French toast or pancakes Canned fruit cocktail  Noodles or pasta (no rice) Pudding, mousse, custard  Cooked vegetables (no frozen peas, corn, or mixed vegetables) Green salad  Canned small sweet peas  Ice cream  Creamed soup or vegetable soup Fruit ice, Italian ice  Pureed vegetable soup or alphabet soup Non-dairy creamer  Ground scalloped apples Margarine  Gravies Mayonnaise  Sauces:  Cheese, creamed, barbecue, tomato, white Ketchup  Coffee or tea Mustard   SAMPLE MENU:  LEVEL 3 Breakfast Lunch Dinner   Orange juice, 1/2 cup  Oatmeal, 1/2 cup  Scrambled eggs with cheese, 1/2 cup  Decaffeinated tea, 1 cup  Whole milk, 1 cup  Non-dairy creamer, 2 Tbsp  Ketchup, 1 Tbsp  Margarine, 1 tsp  Salt, 1/4 tsp  Sugar, 2 tsp  Pineapple juice, 1/2 cup  Ground beef, 3 oz  Gravy, 2 Tbsp  Mashed potatoes, 1/2 cup  Cooked spinach, 1/2 cup  Applesauce, 1/2 cup  Decaffeinated coffee  Whole milk  Non-dairy creamer, 2 Tbsp  Margarine, 1 tsp  Salt, 1/4 tsp  Pureed turkey, barley soup, 3/4 cup  Barbecue chicken, 3 oz  Mashed potatoes, 1/2 cup  Ground fresh broccoli, 1/2 cup  Frozen yogurt, 1/2 cup  Decaffeinated tea, 1 cup  Non-dairy creamer, 2 Tbsp  Margarine, 1 tsp  Salt, 1/4 tsp  Sugar, 1 tsp    LEVEL 4:  REGULAR FOODS  -Foods in this group are soft, moist, regularly textured foods.   -This level includes meat and breads, which tend to be the hardest things to swallow.   -Eat very slowly, chew well and continue to avoid carbonated drinks. -most people are at this level in 4-6 weeks  Hot Foods Cold Foods  Baked fish or skinned Soft cheeses - cottage cheese  Souffles and omelets Cream cheese  Eggs Yogurt  Stuffed shells Milk  Spaghetti with meat sauce Milkshakes  Cooked cereal Cold dry cereals (no nuts, dried fruit, coconut)  French toast or pancakes Crackers  Buttered toast Fruit juices or nectars  Noodles or pasta (no rice) Canned fruit  Potatoes (all types) Ripe bananas  Soft, cooked vegetables (no corn, lima, or baked beans) Peeled, ripe, fresh fruit  Creamed soups or vegetable soup Cakes (no nuts, dried fruit, coconut)  Canned chicken  noodle soup Plain doughnuts  Gravies Ice cream  Bacon dressing Pudding, mousse, custard  Sauces:  Cheese, creamed, barbecue, tomato, white Fruit ice, Italian ice, sherbet  Decaffeinated tea or coffee Whipped gelatin  Pork chops Regular gelatin     Canned fruited gelatin molds   Sugar, syrup, honey, jam, jelly   Cream   Non-dairy   Margarine   Oil   Mayonnaise   Ketchup   Mustard   TROUBLESHOOTING IRREGULAR BOWELS  1) Avoid extremes of bowel movements (no bad constipation/diarrhea)  2) Miralax 17gm mixed in 8oz. water or juice-daily. May use BID as needed.  3) Gas-x,Phazyme, etc. as needed for gas & bloating.  4) Soft,bland diet. No spicy,greasy,fried foods.  5) Prilosec over-the-counter as needed  6) May hold gluten/wheat products from diet to see if symptoms improve.  7) May try probiotics (Align, Activa, etc) to help calm the bowels down  7) If symptoms become worse call back immediately.    If you have any questions please call our office at [facility]: [phone].  

## 2020-07-20 NOTE — Progress Notes (Signed)
TOC CM spoke to pt and she was not coherent to understand the Code 44. She just got back from surgery and received anesthesia. Notified TOC CSW, and medical advisor, Dr Wynelle Cleveland. Will issue TOC CM/CSW issue Code 44 in am when patient is more awake. TOC CM spoke to pt Unit RN to make aware. [place], [place] [facility] TOC CM [phone]

## 2020-07-20 NOTE — Plan of Care (Signed)
  Problem: Clinical Measurements: Goal: Diagnostic test results will improve Outcome: Progressing   Problem: Clinical Measurements: Goal: Respiratory complications will improve Outcome: Progressing   Problem: Clinical Measurements: Goal: Cardiovascular complication will be avoided Outcome: Progressing   Problem: Pain Managment: Goal: General experience of comfort will improve Outcome: Progressing   Problem: Safety: Goal: Ability to remain free from injury will improve Outcome: Progressing

## 2020-07-20 NOTE — Progress Notes (Signed)
Day of Surgery    EP:PIRJJOACZ pain  Subjective: Pt scheduled for surgery today, unfortunately the insurance is not willing to pay at this point.  Dr. Wynelle Cleveland is working to get approval.  Pt is very upset over all of this.  We have her on the schedule and if we get approval she can be done later today  Objective: Vital signs in last 24 hours: Temp:  [97.8 F (36.6 C)-98.8 F (37.1 C)] 97.9 F (36.6 C) (08/24 0559) Pulse Rate:  [71-91] 80 (08/24 0559) Resp:  [14-20] 18 (08/24 0559) BP: (123-138)/(64-84) 123/77 (08/24 0559) SpO2:  [98 %-100 %] 100 % (08/24 0559) Last BM Date: 07/19/20 1780 p.o. 1200 IV Urine x4 recorded BM x1 Afebrile vital signs are stable H/H 7.3/26>> 7.2/25.4>> 7.6/27.5>> 9.3/31.9 CT 8/20: No acute intrathoracic, abdominal, or pelvic pathology.  Large hiatal hernia with gastric volvulus similar to prior CT.  No evidence of obstruction..   Intake/Output from previous day: 08/23 0701 - 08/24 0700 In: 3048.8 [P.O.:1780; I.V.:879.2; Blood:389.6] Out: -  Intake/Output this shift: No intake/output data recorded.  General appearance: alert, cooperative and crying over the insurance issue and not knowing if she can get her surgery.   Lab Results:  Recent Labs    07/19/20 0358 07/20/20 0319  WBC 10.7* 10.5  HGB 7.6* 9.3*  HCT 27.5* 31.9*  PLT 474* 448*    BMET Recent Labs    07/19/20 0358 07/20/20 0319  NA 138 135  K 3.8 3.8  CL 106 103  CO2 25 24  GLUCOSE 100* 116*  BUN 14 13  CREATININE 0.78 0.81  CALCIUM 8.5* 8.3*   PT/INR No results for input(s): LABPROT, INR in the last 72 hours.  Recent Labs  Lab 07/15/20 1012  AST 12*  ALT 9  ALKPHOS 77  BILITOT 0.4  PROT 8.1  ALBUMIN 3.6     Lipase     Component Value Date/Time   LIPASE 31 05/15/2018 2101     Medications: . famotidine  20 mg Oral QHS  . folic acid  1 mg Oral Daily  . pantoprazole  40 mg Oral QAC breakfast  . sodium chloride flush  3 mL Intravenous Q12H   . sodium  chloride    .  ceFAZolin (ANCEF) IV    . 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 ID: Ancef on-call to the OR DVT: SCDs Follow-up: TBD  Plan:  We have planned for surgery today, all her physician's agree with this plan.  Awaiting insurance approval.     LOS: 4 days    Yaretsi Humphres 07/20/2020 Please see Amion

## 2020-07-20 NOTE — Anesthesia Preprocedure Evaluation (Signed)
Anesthesia Evaluation  Patient identified by MRN, date of birth, ID band Patient awake    Reviewed: Allergy & Precautions, NPO status , Patient's Chart, lab work & pertinent test results  Airway Mallampati: I       Dental no notable dental hx. (+) Teeth Intact   Pulmonary neg pulmonary ROS,    Pulmonary exam normal breath sounds clear to auscultation       Cardiovascular negative cardio ROS Normal cardiovascular exam Rhythm:Regular Rate:Normal     Neuro/Psych  Headaches,    GI/Hepatic Neg liver ROS, GERD  ,  Endo/Other  Morbid obesity  Renal/GU negative Renal ROS  negative genitourinary   Musculoskeletal negative musculoskeletal ROS (+)   Abdominal (+) + obese,   Peds  Hematology  (+) Blood dyscrasia, anemia ,   Anesthesia Other Findings   Reproductive/Obstetrics                             Anesthesia Physical Anesthesia Plan  ASA: III  Anesthesia Plan: General   Post-op Pain Management:    Induction: Intravenous  PONV Risk Score and Plan: 4 or greater and Ondansetron, Dexamethasone and Midazolam  Airway Management Planned: Oral ETT  Additional Equipment: None  Intra-op Plan:   Post-operative Plan: Extubation in OR  Informed Consent: I have reviewed the patients History and Physical, chart, labs and discussed the procedure including the risks, benefits and alternatives for the proposed anesthesia with the patient or authorized representative who has indicated his/her understanding and acceptance.     Dental advisory given  Plan Discussed with: CRNA  Anesthesia Plan Comments:         Anesthesia Quick Evaluation

## 2020-07-20 NOTE — Op Note (Signed)
07/20/2020  4:10 PM  PATIENT:  Christina Mcbride  49 y.o. female  PRE-OPERATIVE DIAGNOSIS:  hiatal hernia  POST-OPERATIVE DIAGNOSIS:  hiatal hernia  PROCEDURE:  Procedure(s): XI ROBOTIC ASSISTED LAPAROSCOPIC TOUPET FUNDOPLICATION WITH MESH (N/A)  SURGEON:  Surgeon(s) and Role:    * Ralene Ok, MD - Primary    * Kinsinger, Arta Bruce, MD - Assisting who was essential for help with retraction, identification of the anatomy, and suturing of the wrap.  ANESTHESIA:   local and general  EBL:  10 mL   BLOOD ADMINISTERED:none  DRAINS: none   LOCAL MEDICATIONS USED:  BUPIVICAINE   SPECIMEN:  No Specimen  DISPOSITION OF SPECIMEN:  N/A  COUNTS:  YES  TOURNIQUET:  * No tourniquets in log *  DICTATION: .Dragon Dictation The patient was taken back to the operating room and placed in the supine position with bilateral SCDs in place. The patient was prepped and draped in the usual sterile fashion. After appropriate antibiotics were confirmed a timeout was called and all facts were verified.   A Veress needle technique was used to insufflate the abdomen to 15 mm of mercury the paramedian stab incision. Subsequent to this an 8 mm trocar was introduced as was a 8 millimeter camera. At this time the subsequent robotic trochars x3, were then placed adjacent to this trocar approximately 8-10 cm away. Each trocar was inserted under direct visualization, there were total of 4 trochars. The assistant trocar was then placed in the right lower quadrant under direct visualization. The Nathanson retractor was then visualized inserted into the abdomen and the incision just to the left of the falciform ligament. This was then placed to retract the liver appropriately. At this time the patient was positioned in reverse Trendelenburg.   At this time the robot patient cart was brought to the bedside and placed in good position and the arms were docked to the trochars appropriately. At this time I proceeded to  incised the gastrohepatic ligament.  At this time I proceeded to mobilize the stomach inferiorly and visualize the right crus. The peritoneum over the right crus was incised and right crus was identified. I proceeded to dissect this inferiorly until the left crus was seen joining the right crus. There was a large hernia sac.  This was fully dissected.  Once the right crus was adequately dissected we turned our to the left crus which was dissected away. This required traction of the stomach to the right side. Once this was visualized we then proceeded to circumferentially dissect the esophagus away from the surrounding tissue. The anterior and posterior vagus was seen along the esophagus at the GE junction.  These were both preserved throughout the entire case. There was a large-sized hiatal hernia seen. I mobilized the esophagus cephalad approximately 4-5 cm, clearing away the surrounding tissue. The anterior hernia sac was dissected away from the stomach and esophagus.  At this time we turned our attention to the greater curvature the stomach and the omentum was mobilized using the robotic vessel sealer. This was taken up to the greater curvature to the hiatus. This mobilized the entire greater curvature to allow mobilization and the wrap. I then proceeded to bring the greater curvature the stomach posterior to the esophagus, and a shoeshine technique was used to evaluate the mobilization of the greater curvature.   At this time I proceeded to close the hiatus using figure of 8 0 Ethibonds x 2 and an interrupted x1. This brought together the hiatal  closure without undue stricture to the esophagus.   A piece of Gore Bio A hiatal mesh was placed over the hiatal closure and sutured to the crus using 2-0 silk sutures x 4.  At this time the greater curvature was brought around the esophagus and sutured using 0 silk sutures interrupted fashion approximately 1 cm apart x3 on each side of the esophagus in a Toupet  fashion. A left collar stitch was then used to gastropexy the stomach from the wrap to the diaphragm just lateral to the left crus as.  A second collar stitch was placed from the wrap to the right crus.  The wrap lay loose with no strangulation of the esophagus.  At this time the robot was undocked. The liver trocar was removed. At this time insufflation was evacuated. Skin was reapproximated for Monocryl subcuticular fashion. The skin was then dressed with Dermabond. The patient tolerated the procedure well and was taken to the recovery room in stable condition.  PLAN OF CARE: Admit to inpatient   PATIENT DISPOSITION:  PACU - hemodynamically stable.   Delay start of Pharmacological VTE agent (>24hrs) due to surgical blood loss or risk of bleeding: yes

## 2020-07-20 NOTE — Progress Notes (Signed)
PROGRESS NOTE  Christina Mcbride IDP:[id number] DOB: Dec 24, 1970 DOA: 07/15/2020 PCP: Christina Perna, NP  HPI/Recap of past 24 hours: HPI from Dr Christina Mcbride is a 49 y.o. female with a history of iron-deficiency anemia and migraines who presented to the ED with lightheadedness and shortness of breath with exertion that has been constant and worsening since June 2021, now severe, consistent with prior episodes where she required transfusions. She has been getting iron infusions regularly, but none since May 2021 due to transportation issues and having covid-19. She denies bleeding of any kind. She was evaluated by GI in 09/2019 with EGD, colonoscopy, capsule endoscopy without bleeding source found (large hiatal hernia noted). In the ED, CBC confirms hgb 5.9.  2u PRBCs ordered and transfused. GI consulted as well.     Today, saw patient before surgery, patient continues to complain of epigastric pain.       Assessment/Plan: Principal Problem:   Symptomatic anemia Active Problems:   PTSD (post-traumatic stress disorder)   Migraine   Iron deficiency anemia due to chronic blood loss   Gastroesophageal reflux disease without esophagitis   Paraesophageal hiatal hernia   Obesity, Class III, BMI 40-49.9 (morbid obesity) ([place])   Occult blood positive stool   Acute on chronic blood loss anemia   Acute on chronic symptomatic iron deficiency anemia Large hiatal hernia, s/p robotic laparoscopic fundoplication with mesh on 07/20/2020 Unknown etiology, FOBT positive without obvious active GI bleed Hemoglobin 5.9 on admission Anemia panel showed iron 18, sats 4, ferritin 11 S/P 2 units of PRBC, transfused another 1U of PRBC on 07/19/20 prior to surgery IV Feraheme on 07/16/2020 Recent work-up, colonoscopy/EGD done 09/2019 unremarkable, small bowel capsule endoscopy done 04/2020 was also negative GI consulted, recommend general surgery consult for possible hiatal hernia repair, due to  concerns for possible Cameron's erosion.  No plans for any further endoscopic evaluation at this time CT abdomen/pelvis showed large hiatal hernia with gastric volvulus, no evidence of obstruction General surgery consulted, s/p robotic laparoscopic fundoplication with mesh on 07/20/2020 Continue p.o. iron supplementation Continue PPI daily, H2 blocker at nighttime Monitor CBC closely  Morbid obesity Lifestyle modification advised       Malnutrition Type:      Malnutrition Characteristics:      Nutrition Interventions:       Estimated body mass index is 42.29 kg/m as calculated from the following:   Height as of this encounter: 5\' 7"  (1.702 m).   Weight as of this encounter: 122.5 kg.     Code Status: Full  Family Communication: Discussed extensively with patient  Disposition Plan: Status is: Observation    Dispo: The patient is from: Home              Anticipated d/c is to: Home              Anticipated d/c date is: 1 day              Patient currently is not medically stable to d/c.    Consultants:  GI  General surgery  Procedures:  S/p robotic laparoscopic fundoplication on 4/43/1540  Antimicrobials:  None  DVT prophylaxis: SCDs   Objective: Vitals:   07/20/20 0134 07/20/20 0559 07/20/20 0911 07/20/20 1208  BP: 134/84 123/77 (!) 144/92   Pulse: 84 80 92   Resp: 20 18 16    Temp: 97.8 F (36.6 C) 97.9 F (36.6 C) 98.4 F (36.9 C)   TempSrc: Oral Oral Oral   SpO2:  100% 100% 100%   Weight:    122.5 kg  Height:    5\' 7"  (1.702 m)    Intake/Output Summary (Last 24 hours) at 07/20/2020 1631 Last data filed at 07/20/2020 1459 Gross per 24 hour  Intake 2785.29 ml  Output 210 ml  Net 2575.29 ml   Filed Weights   07/15/20 0948 07/20/20 1208  Weight: 122.5 kg 122.5 kg    Exam:  General: NAD   Cardiovascular: S1, S2 present  Respiratory: CTAB  Abdomen: Soft, nontender, nondistended, bowel sounds present  Musculoskeletal: No  bilateral pedal edema noted  Skin: Normal  Psychiatry: Normal mood    Data Reviewed: CBC: Recent Labs  Lab 07/16/20 0555 07/17/20 0323 07/18/20 0307 07/19/20 0358 07/20/20 0319  WBC 10.3 9.0 9.9 10.7* 10.5  NEUTROABS  --  6.5 7.1 7.7 7.7  HGB 7.2* 7.3* 7.2* 7.6* 9.3*  HCT 25.3* 26.1* 25.4* 27.5* 31.9*  MCV 70.1* 70.7* 70.8* 72.9* 72.7*  PLT 520* 518* 478* 474* 948*   Basic Metabolic Panel: Recent Labs  Lab 07/15/20 1012 07/17/20 0323 07/18/20 0307 07/19/20 0358 07/20/20 0319  NA 137 137 138 138 135  K 4.3 3.7 3.6 3.8 3.8  CL 103 105 105 106 103  CO2 23 23 25 25 24   GLUCOSE 112* 101* 102* 100* 116*  BUN 20 11 12 14 13   CREATININE 0.84 0.78 0.82 0.78 0.81  CALCIUM 9.0 8.5* 8.4* 8.5* 8.3*   GFR: Estimated Creatinine Clearance: 112.8 mL/min (by C-G formula based on SCr of 0.81 mg/dL). Liver Function Tests: Recent Labs  Lab 07/15/20 1012  AST 12*  ALT 9  ALKPHOS 77  BILITOT 0.4  PROT 8.1  ALBUMIN 3.6   No results for input(s): LIPASE, AMYLASE in the last 168 hours. No results for input(s): AMMONIA in the last 168 hours. Coagulation Profile: Recent Labs  Lab 07/16/20 0600  INR 1.2   Cardiac Enzymes: No results for input(s): CKTOTAL, CKMB, CKMBINDEX, TROPONINI in the last 168 hours. BNP (last 3 results) No results for input(s): PROBNP in the last 8760 hours. HbA1C: No results for input(s): HGBA1C in the last 72 hours. CBG: No results for input(s): GLUCAP in the last 168 hours. Lipid Profile: No results for input(s): CHOL, HDL, LDLCALC, TRIG, CHOLHDL, LDLDIRECT in the last 72 hours. Thyroid Function Tests: No results for input(s): TSH, T4TOTAL, FREET4, T3FREE, THYROIDAB in the last 72 hours. Anemia Panel: No results for input(s): VITAMINB12, FOLATE, FERRITIN, TIBC, IRON, RETICCTPCT in the last 72 hours. Urine analysis:    Component Value Date/Time   COLORURINE YELLOW 11/29/2018 0215   APPEARANCEUR HAZY (A) 11/29/2018 0215   LABSPEC 1.025  11/29/2018 0215   PHURINE 5.0 11/29/2018 0215   GLUCOSEU NEGATIVE 11/29/2018 0215   HGBUR SMALL (A) 11/29/2018 0215   BILIRUBINUR NEGATIVE 11/29/2018 0215   KETONESUR NEGATIVE 11/29/2018 0215   PROTEINUR NEGATIVE 11/29/2018 0215   UROBILINOGEN 0.2 07/10/2015 1115   NITRITE NEGATIVE 11/29/2018 0215   LEUKOCYTESUR NEGATIVE 11/29/2018 0215   Sepsis Labs: @LABRCNTIP (procalcitonin:4,lacticidven:4)  ) Recent Results (from the past 240 hour(s))  SARS Coronavirus 2 by RT PCR (hospital order, performed in [place] hospital lab) Nasopharyngeal Nasopharyngeal Swab     Status: None   Collection Time: 07/15/20  2:56 PM   Specimen: Nasopharyngeal Swab  Result Value Ref Range Status   SARS Coronavirus 2 NEGATIVE NEGATIVE Final    Comment: (NOTE) SARS-CoV-2 target nucleic acids are NOT DETECTED.  The SARS-CoV-2 RNA is generally detectable in upper and lower  respiratory specimens during the acute phase of infection. The lowest concentration of SARS-CoV-2 viral copies this assay can detect is 250 copies / mL. A negative result does not preclude SARS-CoV-2 infection and should not be used as the sole basis for treatment or other patient management decisions.  A negative result may occur with improper specimen collection / handling, submission of specimen other than nasopharyngeal swab, presence of viral mutation(s) within the areas targeted by this assay, and inadequate number of viral copies (<250 copies / mL). A negative result must be combined with clinical observations, patient history, and epidemiological information.  Fact Sheet for Patients:   StrictlyIdeas.no  Fact Sheet for Healthcare Providers: BankingDealers.co.za  This test is not yet approved or  cleared by the [place] FDA and has been authorized for detection and/or diagnosis of SARS-CoV-2 by FDA under an Emergency Use Authorization (EUA).  This EUA will remain in effect  (meaning this test can be used) for the duration of the COVID-19 declaration under Section 564(b)(1) of the Act, 21 U.S.C. section 360bbb-3(b)(1), unless the authorization is terminated or revoked sooner.  Performed at [facility], [place] [address], [place], [place] [address]   Surgical PCR screen     Status: None   Collection Time: 07/19/20 10:21 PM   Specimen: Nasal Mucosa; Nasal Swab  Result Value Ref Range Status   MRSA, PCR NEGATIVE NEGATIVE Final   Staphylococcus aureus NEGATIVE NEGATIVE Final    Comment: (NOTE) The Xpert SA Assay (FDA approved for NASAL specimens in patients 61 years of age and older), is one component of a comprehensive surveillance program. It is not intended to diagnose infection nor to guide or monitor treatment. Performed at [facility], [place] [address], [place], [place] [address]       Studies: No results found.  Scheduled Meds: . [MAR Hold] famotidine  20 mg Oral QHS  . [MAR Hold] folic acid  1 mg Oral Daily  . [MAR Hold] pantoprazole  40 mg Oral QAC breakfast  . [MAR Hold] sodium chloride flush  3 mL Intravenous Q12H    Continuous Infusions: . [MAR Hold] sodium chloride    . dextrose 5 % and 0.9 % NaCl with KCl 40 mEq/L 100 mL/hr at 07/20/20 0949     LOS: 4 days     Alma Friendly, MD [facility]  If 7PM-7AM, please contact night-coverage [email or web address] 07/20/2020, 4:31 PM

## 2020-07-20 NOTE — Transfer of Care (Signed)
Immediate Anesthesia Transfer of Care Note  Patient: Christina Mcbride  Procedure(s) Performed: Procedure(s): XI ROBOTIC ASSISTED LAPAROSCOPIC NISSEN FUNDOPLICATION WITH MESH (N/A)  Patient Location: PACU  Anesthesia Type:General  Level of Consciousness:  sedated, patient cooperative and responds to stimulation  Airway & Oxygen Therapy:Patient Spontanous Breathing and Patient connected to face mask oxgen  Post-op Assessment:  Report given to PACU RN and Post -op Vital signs reviewed and stable  Post vital signs:  Reviewed and stable  Last Vitals:  Vitals:   07/20/20 0911 07/20/20 1628  BP: (!) 144/92   Pulse: 92   Resp: 16   Temp: 36.9 C (P) 36.5 C  SpO2: 423%     Complications: No apparent anesthesia complications

## 2020-07-20 NOTE — Plan of Care (Signed)
  Problem: Education: Goal: Knowledge of General Education information will improve Description: Including pain rating scale, medication(s)/side effects and non-pharmacologic comfort measures Outcome: Progressing   Problem: Health Behavior/Discharge Planning: Goal: Ability to manage health-related needs will improve Outcome: Progressing   Problem: Clinical Measurements: Goal: Ability to maintain clinical measurements within normal limits will improve Outcome: Progressing Goal: Will remain free from infection Outcome: Progressing Goal: Diagnostic test results will improve Outcome: Progressing Goal: Respiratory complications will improve Outcome: Progressing Goal: Cardiovascular complication will be avoided Outcome: Progressing   Problem: Nutrition: Goal: Adequate nutrition will be maintained Outcome: Progressing   Problem: Coping: Goal: Level of anxiety will decrease Outcome: Progressing   Problem: Elimination: Goal: Will not experience complications related to bowel motility Outcome: Progressing   Problem: Pain Managment: Goal: General experience of comfort will improve Outcome: Progressing   Problem: Safety: Goal: Ability to remain free from injury will improve Outcome: Progressing   

## 2020-07-21 ENCOUNTER — Observation Stay (HOSPITAL_COMMUNITY): Payer: Medicare HMO

## 2020-07-21 DIAGNOSIS — D649 Anemia, unspecified: Secondary | ICD-10-CM | POA: Diagnosis not present

## 2020-07-21 LAB — CBC WITH DIFFERENTIAL/PLATELET
Abs Immature Granulocytes: 0.15 10*3/uL — ABNORMAL HIGH (ref 0.00–0.07)
Basophils Absolute: 0 10*3/uL (ref 0.0–0.1)
Basophils Relative: 0 %
Eosinophils Absolute: 0 10*3/uL (ref 0.0–0.5)
Eosinophils Relative: 0 %
HCT: 31.4 % — ABNORMAL LOW (ref 36.0–46.0)
Hemoglobin: 8.8 g/dL — ABNORMAL LOW (ref 12.0–15.0)
Immature Granulocytes: 1 %
Lymphocytes Relative: 4 %
Lymphs Abs: 0.6 10*3/uL — ABNORMAL LOW (ref 0.7–4.0)
MCH: 21 pg — ABNORMAL LOW (ref 26.0–34.0)
MCHC: 28 g/dL — ABNORMAL LOW (ref 30.0–36.0)
MCV: 74.8 fL — ABNORMAL LOW (ref 80.0–100.0)
Monocytes Absolute: 0.1 10*3/uL (ref 0.1–1.0)
Monocytes Relative: 1 %
Neutro Abs: 14.3 10*3/uL — ABNORMAL HIGH (ref 1.7–7.7)
Neutrophils Relative %: 94 %
Platelets: 447 10*3/uL — ABNORMAL HIGH (ref 150–400)
RBC: 4.2 MIL/uL (ref 3.87–5.11)
RDW: 28.7 % — ABNORMAL HIGH (ref 11.5–15.5)
WBC: 15.1 10*3/uL — ABNORMAL HIGH (ref 4.0–10.5)
nRBC: 0.3 % — ABNORMAL HIGH (ref 0.0–0.2)

## 2020-07-21 LAB — BPAM RBC
Blood Product Expiration Date: 202109122359
ISSUE DATE / TIME: 202108231626
Unit Type and Rh: 9500

## 2020-07-21 LAB — BASIC METABOLIC PANEL
Anion gap: 9 (ref 5–15)
BUN: 10 mg/dL (ref 6–20)
CO2: 22 mmol/L (ref 22–32)
Calcium: 8.6 mg/dL — ABNORMAL LOW (ref 8.9–10.3)
Chloride: 106 mmol/L (ref 98–111)
Creatinine, Ser: 0.76 mg/dL (ref 0.44–1.00)
GFR calc Af Amer: >60 mL/min (ref >60)
GFR calc non Af Amer: >60 mL/min (ref >60)
Glucose, Bld: 177 mg/dL — ABNORMAL HIGH (ref 70–99)
Potassium: 4.6 mmol/L (ref 3.5–5.1)
Sodium: 137 mmol/L (ref 135–145)

## 2020-07-21 LAB — TYPE AND SCREEN
ABO/RH(D): B POS
Antibody Screen: POSITIVE
Donor AG Type: NEGATIVE
Unit division: 0

## 2020-07-21 MED ORDER — OXYCODONE HCL 5 MG/5ML PO SOLN
5.0000 mg | Freq: Four times a day (QID) | ORAL | 0 refills | Status: DC | PRN
Start: 2020-07-21 — End: 2020-07-21

## 2020-07-21 MED ORDER — METHOCARBAMOL 1000 MG/10ML IJ SOLN
500.0000 mg | Freq: Four times a day (QID) | INTRAVENOUS | Status: DC | PRN
  Filled 2020-07-21: qty 5

## 2020-07-21 MED ORDER — PANTOPRAZOLE SODIUM 40 MG PO TBEC: 40.0000 mg | DELAYED_RELEASE_TABLET | Freq: Every day | ORAL | 0 refills | Status: DC

## 2020-07-21 MED ORDER — ACETAMINOPHEN 325 MG PO TABS
650.0000 mg | ORAL_TABLET | Freq: Four times a day (QID) | ORAL | Status: DC | PRN
Start: 2020-07-21 — End: 2023-11-22

## 2020-07-21 MED ORDER — OXYCODONE HCL 5 MG/5ML PO SOLN: 5.0000 mg | ORAL | Status: DC | PRN

## 2020-07-21 MED ORDER — OXYCODONE HCL 5 MG/5ML PO SOLN
5.0000 mg | Freq: Four times a day (QID) | ORAL | 0 refills | Status: AC | PRN
Start: 2020-07-21 — End: ?

## 2020-07-21 MED ORDER — IOHEXOL 300 MG/ML  SOLN: 150.0000 mL | Freq: Once | INTRAMUSCULAR | Status: DC | PRN

## 2020-07-21 MED ORDER — HYDROMORPHONE HCL 1 MG/ML IJ SOLN: 1.0000 mg | INTRAMUSCULAR | Status: DC | PRN

## 2020-07-21 MED ORDER — IOHEXOL 300 MG/ML  SOLN
150.0000 mL | Freq: Once | INTRAMUSCULAR | Status: AC | PRN
  Administered 2020-07-21: 75 mL via ORAL

## 2020-07-21 NOTE — Progress Notes (Signed)
Patient is tolerating diet well w/o pain, or nausea, or discomfort. All 6 port lap sites are dry clean intact and skin glue is intact. Patient is stable and excited about going home.

## 2020-07-21 NOTE — Progress Notes (Signed)
[facility] Progress Note  1 Day Post-Op  Subjective: CC-  Abdomen sore but overall feeling well since surgery. Denies n/v. Passed flatus x2, no BM. Has been up ambulating without issues. Only used toradol so far this morning for pain.  Objective: Vital signs in last 24 hours: Temp:  [97.3 F (36.3 C)-98.4 F (36.9 C)] 98.1 F (36.7 C) (08/25 0533) Pulse Rate:  [79-105] 88 (08/25 0533) Resp:  [9-18] 16 (08/25 0533) BP: (130-166)/(72-105) 130/80 (08/25 0533) SpO2:  [91 %-100 %] 97 % (08/25 0533) Weight:  [122.5 kg] 122.5 kg (08/24 1208) Last BM Date: 07/19/20  Intake/Output from previous day: 08/24 0701 - 08/25 0700 In: 2214.3 [P.O.:240; I.V.:1924.3; IV Piggyback:50] Out: 1210 [Urine:1200; Blood:10] Intake/Output this shift: Total I/O In: -  Out: 400 [Urine:400]  PE: Gen:  Alert, NAD, pleasant Pulm:  rate and effort normal Abd: Soft, mild distension, appropriately tender, few BS heard, lap incisions C/D/I  Lab Results:  Recent Labs    07/20/20 0319 07/21/20 0240  WBC 10.5 15.1*  HGB 9.3* 8.8*  HCT 31.9* 31.4*  PLT 448* 447*   BMET Recent Labs    07/20/20 0319 07/21/20 0240  NA 135 137  K 3.8 4.6  CL 103 106  CO2 24 22  GLUCOSE 116* 177*  BUN 13 10  CREATININE 0.81 0.76  CALCIUM 8.3* 8.6*   PT/INR No results for input(s): LABPROT, INR in the last 72 hours. CMP     Component Value Date/Time   NA 137 07/21/2020 0240   NA 139 02/26/2017 1059   K 4.6 07/21/2020 0240   CL 106 07/21/2020 0240   CO2 22 07/21/2020 0240   GLUCOSE 177 (H) 07/21/2020 0240   BUN 10 07/21/2020 0240   BUN 8 02/26/2017 1059   CREATININE 0.76 07/21/2020 0240   CREATININE 1.08 02/02/2017 1446   CALCIUM 8.6 (L) 07/21/2020 0240   PROT 8.1 07/15/2020 1012   ALBUMIN 3.6 07/15/2020 1012   AST 12 (L) 07/15/2020 1012   ALT 9 07/15/2020 1012   ALKPHOS 77 07/15/2020 1012   BILITOT 0.4 07/15/2020 1012   GFRNONAA >60 07/21/2020 0240   GFRNONAA >89 12/26/2013 1050    GFRAA >60 07/21/2020 0240   GFRAA >89 12/26/2013 1050   Lipase     Component Value Date/Time   LIPASE 31 05/15/2018 2101       Studies/Results: No results found.  Anti-infectives: Anti-infectives (From admission, onward)   Start     Dose/Rate Route Frequency Ordered Stop   07/20/20 0600  ceFAZolin (ANCEF) 3 g in dextrose 5 % 50 mL IVPB        3 g 100 mL/hr over 30 Minutes Intravenous On call to O.R. 07/19/20 0940 07/20/20 1348       Assessment/Plan Anemia  -Transfused 1 units PRBC 07/19/2020. Hgb 8.8 from 9.3, monitor Hx PTSD Hx migraines GERD/esophagitis BMI 42.29  Hiatal hernia -S/p XI ROBOTIC ASSISTED LAPAROSCOPIC TOUPET FUNDOPLICATION WITH MESH  3/47 Dr. Rosendo Gros - POD#1 - Esophagogram today, if negative for leak will start CLD - continue mobilizing  ID - ancef periop FEN - IVF, NPO VTE - SCDs, ok for chemical DVT prophylaxis from surgical standpoint Foley - none Follow up - Dr. Rosendo Gros   LOS: 4 days    Christina Mcbride, [facility] Surgery 07/21/2020, 8:45 AM Please see Amion for pager number during day hours 7:00am-4:30pm

## 2020-07-21 NOTE — Discharge Summary (Signed)
Physician Discharge Summary  Christina Mcbride GMW:[id number] DOB: 1971/02/28 DOA: 07/15/2020  PCP: Kerin Perna, NP  Admit date: 07/15/2020 Discharge date: 07/21/2020  Admitted From: Home Disposition: Home  Recommendations for Outpatient Follow-up:  1. Follow up with PCP in 1-2 weeks 2. Please obtain BMP/CBC in one week 3. Please follow up on the following pending results:  Home Health:None  Equipment/Devices: None  Discharge Condition: Stable Code Status:   Code Status: Full Code Diet recommendation:  Diet Order            Diet clear liquid Room service appropriate? Yes; Fluid consistency: Thin  Diet effective now                  Brief/Interim Summary:   Brief narrative: 49 y.o.femalewith a history ofiron-deficiency anemia and migraines who presented to the ED with lightheadedness and shortness of breath with exertion that has been constant and worsening since June 2021, now severe, consistent with prior episodes where she required transfusions. She has been getting iron infusions regularly, but none since May 2021 due to transportation issues and having covid-19. She denies bleeding of any kind. She was evaluated by GI in 09/2019 with EGD, colonoscopy, capsule endoscopy without bleeding source found (large hiatal hernia noted). In the ED, CBC confirms hgb 5.9.  2u PRBCs ordered and transfused. GI consulted as well. Pt was treated for acute on chronic symptomatic iron deficiency anemia, status post units PRBC IV Feraheme recent colonoscopy EGD on 11/20 was unremarkable, small bowel capsule endoscopy done 04/2020 was also negative.GI was consulted, recommend general surgery consult for possible hiatal hernia repair, due to concerns for possible Cameron's erosion.  No plans for any further endoscopic evaluation at this time CT abdomen/pelvis showed large hiatal hernia with gastric volvulus, no evidence of obstruction General surgery consulted, s/p robotic laparoscopic  fundoplication with mesh on 07/20/2020. Postop patient was doing fairly well, ambulatory pain control.  H&H stable. Post po placed on CLD and if tolerating can go home as per surgery 07/21/20. Per surgery patient will be going home on clear liquid diet and after couple of weeks 2.  Diet until follow-up with surgery for further instruction.  Discharge Diagnoses:  Acute on chronic symptomatic iron deficiency anemia Large hiatal hernia status post robotic laparoscopic fundoplication with mesh 4/40 Morbid obesity with BMI 42 Leukocytosis patient is advised to repeat CBC in 1 week.  PTSD (post-traumatic stress disorder) Migraine Iron deficiency anemia due to chronic blood loss Paraesophageal hiatal hernia Obesity, Class III, BMI 40-49.9 (morbid obesity) ([place])  Consults:  ccs  Subjective: Resting well no nausea vomiting.  Tolerated clear liquid diet well.  Esophagogram no leak. I followed up with her after her lunch she did well no nausea vomiting and would like to go home today.  Discharge Exam: Vitals:   07/21/20 1328 07/21/20 1413  BP: 132/82 134/81  Pulse: 81   Resp: 14 18  Temp: 98.3 F (36.8 C) 98.9 F (37.2 C)  SpO2: 100% 98%   General: Pt is alert, awake, not in acute distress Cardiovascular: RRR, S1/S2 +, no rubs, no gallops Respiratory: CTA bilaterally, no wheezing, no rhonchi Abdominal: Soft, NT, ND, bowel sounds + Extremities: no edema, no cyanosis  Discharge Instructions  Discharge Instructions    Discharge instructions   Complete by: As directed    Please call call MD or return to ER for similar or worsening recurring problem that brought you to hospital or if any fever,nausea/vomiting,abdominal pain, uncontrolled pain, chest pain,  shortness of breath or any other alarming symptoms.  Please follow-up your doctor as instructed in a week time checked and get you CBC checked, Please call the office for appointment.  Please avoid alcohol, smoking, or any other  illicit substance and maintain healthy habits including taking your regular medications as prescribed.  Continue diet as planned by your surgery and follow with surgery regarding rther advancement of diet  You were cared for by a hospitalist during your hospital stay. If you have any questions about your discharge medications or the care you received while you were in the hospital after you are discharged, you can call the unit and ask to speak with the hospitalist on call if the hospitalist that took care of you is not available.  Once you are discharged, your primary care physician will handle any further medical issues. Please note that NO REFILLS for any discharge medications will be authorized once you are discharged, as it is imperative that you return to your primary care physician (or establish a relationship with a primary care physician if you do not have one) for your aftercare needs so that they can reassess your need for medications and monitor your lab values   Increase activity slowly   Complete by: As directed      Allergies as of 07/21/2020      Reactions   Bee Venom Anaphylaxis      Medication List    TAKE these medications   acetaminophen 325 MG tablet Commonly known as: TYLENOL Take 2 tablets (650 mg total) by mouth every 6 (six) hours as needed for mild pain (or Fever >/= 101).   folic acid 1 MG tablet Commonly known as: FOLVITE Take 1 mg by mouth daily.   Multivitamin Adult Tabs Take 2 tablets by mouth daily.   oxyCODONE 5 MG/5ML solution Commonly known as: ROXICODONE Take 5 mLs (5 mg total) by mouth every 6 (six) hours as needed for severe pain.   pantoprazole 40 MG tablet Commonly known as: PROTONIX Take 1 tablet (40 mg total) by mouth daily before breakfast.       Follow-up Information    Ralene Ok, MD. Go on 08/11/2020.   Specialty: General Surgery Why: Your appointment is 9/15 at 4:40pm. Please arrive 30 minutes prior to your appointment to  check in and fill out paperwork. Bring photo ID and insurance information. Contact information: [address] [place] [address] [phone]        Kerin Perna, NP Follow up in 1 week(s).   Specialty: Internal Medicine Why: for cbc check and post hosp follow Contact information: [address] [place] [address] [phone]              Allergies  Allergen Reactions  . Bee Venom Anaphylaxis    The results of significant diagnostics from this hospitalization (including imaging, microbiology, ancillary and laboratory) are listed below for reference.    Microbiology: Recent Results (from the past 240 hour(s))  SARS Coronavirus 2 by RT PCR (hospital order, performed in [facility] hospital lab) Nasopharyngeal Nasopharyngeal Swab     Status: None   Collection Time: 07/15/20  2:56 PM   Specimen: Nasopharyngeal Swab  Result Value Ref Range Status   SARS Coronavirus 2 NEGATIVE NEGATIVE Final    Comment: (NOTE) SARS-CoV-2 target nucleic acids are NOT DETECTED.  The SARS-CoV-2 RNA is generally detectable in upper and lower respiratory specimens during the acute phase of infection. The lowest concentration  of SARS-CoV-2 viral copies this assay can detect is 250 copies / mL. A negative result does not preclude SARS-CoV-2 infection and should not be used as the sole basis for treatment or other patient management decisions.  A negative result may occur with improper specimen collection / handling, submission of specimen other than nasopharyngeal swab, presence of viral mutation(s) within the areas targeted by this assay, and inadequate number of viral copies (<250 copies / mL). A negative result must be combined with clinical observations, patient history, and epidemiological information.  Fact Sheet for Patients:   StrictlyIdeas.no  Fact Sheet for Healthcare Providers: BankingDealers.co.za  This  test is not yet approved or  cleared by the [place] FDA and has been authorized for detection and/or diagnosis of SARS-CoV-2 by FDA under an Emergency Use Authorization (EUA).  This EUA will remain in effect (meaning this test can be used) for the duration of the COVID-19 declaration under Section 564(b)(1) of the Act, 21 U.S.C. section 360bbb-3(b)(1), unless the authorization is terminated or revoked sooner.  Performed at [facility], [place] [address], [place], [place] [address]   Surgical PCR screen     Status: None   Collection Time: 07/19/20 10:21 PM   Specimen: Nasal Mucosa; Nasal Swab  Result Value Ref Range Status   MRSA, PCR NEGATIVE NEGATIVE Final   Staphylococcus aureus NEGATIVE NEGATIVE Final    Comment: (NOTE) The Xpert SA Assay (FDA approved for NASAL specimens in patients 67 years of age and older), is one component of a comprehensive surveillance program. It is not intended to diagnose infection nor to guide or monitor treatment. Performed at [facility], [place] [address], [place], [place] [address]     Procedures/Studies: CT CHEST ABDOMEN PELVIS W CONTRAST  Result Date: 07/16/2020 CLINICAL DATA:  49 year old female with chest and upper abdominal pain. Anemia. EXAM: CT CHEST, ABDOMEN, AND PELVIS WITH CONTRAST TECHNIQUE: Multidetector CT imaging of the chest, abdomen and pelvis was performed following the standard protocol during bolus administration of intravenous contrast. CONTRAST:  120mL OMNIPAQUE IOHEXOL 300 MG/ML  SOLN COMPARISON:  Chest radiograph dated 03/31/2020 and CT abdomen pelvis dated 03/31/2020 FINDINGS: CT CHEST FINDINGS Cardiovascular: There is no cardiomegaly or pericardial effusion. The thoracic aorta is unremarkable. The origins of the great vessels of the aortic arch appear patent. The central pulmonary arteries are unremarkable. Mediastinum/Nodes: There is no hilar or mediastinal adenopathy. The esophagus and  the thyroid gland are grossly unremarkable. No mediastinal fluid collection. Lungs/Pleura: Fall there is minimal left lung base atelectasis. No focal consolidation, pleural effusion, pneumothorax. The central airways are patent. Musculoskeletal: No chest wall mass or suspicious bone lesions identified. CT ABDOMEN PELVIS FINDINGS No intra-abdominal free air or free fluid. Hepatobiliary: Several subcentimeter hepatic hypodense lesions are too small to characterize. No intrahepatic biliary ductal dilatation. The gallbladder is unremarkable. Pancreas: Unremarkable. No pancreatic ductal dilatation or surrounding inflammatory changes. Spleen: Normal in size without focal abnormality. Adrenals/Urinary Tract: The adrenal glands unremarkable. There is no hydronephrosis on either side. There is symmetric enhancement and excretion of contrast by both kidneys. There is a 1 cm left renal inferior pole cyst. The visualized ureters and urinary bladder appear unremarkable. Stomach/Bowel: There is a large hiatal hernia containing the majority of the stomach with organo-axial gastric volvulus similar to prior CT. No evidence of gastric outlet obstruction. There is no bowel obstruction or active inflammation. The appendix is normal. Vascular/Lymphatic: The abdominal aorta and IVC unremarkable. No portal venous gas. There is  no adenopathy. Reproductive: The uterus is anteverted and grossly unremarkable. Bilateral tubal ligation clips noted. Other: None Musculoskeletal: Bilateral sacroiliitis. No acute osseous pathology. IMPRESSION: 1. No acute intrathoracic, abdominal, or pelvic pathology. 2. Large hiatal hernia with gastric volvulus similar to prior CT. No evidence of obstruction. Electronically Signed   By: Anner Crete M.D.   On: 07/16/2020 17:46   DG ESOPHAGUS W SINGLE CM (SOL OR THIN BA)  Result Date: 07/21/2020 CLINICAL DATA:  Hiatal hernia repair yesterday. EXAM: ESOPHOGRAM/BARIUM SWALLOW TECHNIQUE: Single contrast  examination was performed using  water-soluble. FLUOROSCOPY TIME:  Fluoroscopy Time:  0 minutes 42 seconds Radiation Exposure Index (if provided by the fluoroscopic device): 36 mGy Number of Acquired Spot Images: 6 COMPARISON:  None. FINDINGS: Patient drank water-soluble contrast from a cup. There is dilatation of the distal esophagus with an associated air contrast level. A very thin channel of contrast traverses the hiatal hernia repair. Imaging was performed in the upright standing position. IMPRESSION: Thin channel of contrast traverses the hiatal hernia repair, likely due to postoperative edema. Difficult to definitively exclude stricture/obstruction. Electronically Signed   By: Lorin Picket M.D.   On: 07/21/2020 10:08    Labs: BNP (last 3 results) No results for input(s): BNP in the last 8760 hours. Basic Metabolic Panel: Recent Labs  Lab 07/17/20 0323 07/18/20 0307 07/19/20 0358 07/20/20 0319 07/21/20 0240  NA 137 138 138 135 137  K 3.7 3.6 3.8 3.8 4.6  CL 105 105 106 103 106  CO2 23 25 25 24 22   GLUCOSE 101* 102* 100* 116* 177*  BUN 11 12 14 13 10   CREATININE 0.78 0.82 0.78 0.81 0.76  CALCIUM 8.5* 8.4* 8.5* 8.3* 8.6*   Liver Function Tests: Recent Labs  Lab 07/15/20 1012  AST 12*  ALT 9  ALKPHOS 77  BILITOT 0.4  PROT 8.1  ALBUMIN 3.6   No results for input(s): LIPASE, AMYLASE in the last 168 hours. No results for input(s): AMMONIA in the last 168 hours. CBC: Recent Labs  Lab 07/17/20 0323 07/18/20 0307 07/19/20 0358 07/20/20 0319 07/21/20 0240  WBC 9.0 9.9 10.7* 10.5 15.1*  NEUTROABS 6.5 7.1 7.7 7.7 14.3*  HGB 7.3* 7.2* 7.6* 9.3* 8.8*  HCT 26.1* 25.4* 27.5* 31.9* 31.4*  MCV 70.7* 70.8* 72.9* 72.7* 74.8*  PLT 518* 478* 474* 448* 447*   Cardiac Enzymes: No results for input(s): CKTOTAL, CKMB, CKMBINDEX, TROPONINI in the last 168 hours. BNP: Invalid input(s): POCBNP CBG: No results for input(s): GLUCAP in the last 168 hours. D-Dimer No results for  input(s): DDIMER in the last 72 hours. Hgb A1c No results for input(s): HGBA1C in the last 72 hours. Lipid Profile No results for input(s): CHOL, HDL, LDLCALC, TRIG, CHOLHDL, LDLDIRECT in the last 72 hours. Thyroid function studies No results for input(s): TSH, T4TOTAL, T3FREE, THYROIDAB in the last 72 hours.  Invalid input(s): FREET3 Anemia work up No results for input(s): VITAMINB12, FOLATE, FERRITIN, TIBC, IRON, RETICCTPCT in the last 72 hours. Urinalysis    Component Value Date/Time   COLORURINE YELLOW 11/29/2018 0215   APPEARANCEUR HAZY (A) 11/29/2018 0215   LABSPEC 1.025 11/29/2018 0215   PHURINE 5.0 11/29/2018 0215   GLUCOSEU NEGATIVE 11/29/2018 0215   HGBUR SMALL (A) 11/29/2018 0215   BILIRUBINUR NEGATIVE 11/29/2018 0215   KETONESUR NEGATIVE 11/29/2018 0215   PROTEINUR NEGATIVE 11/29/2018 0215   UROBILINOGEN 0.2 07/10/2015 1115   NITRITE NEGATIVE 11/29/2018 0215   LEUKOCYTESUR NEGATIVE 11/29/2018 0215   Sepsis Labs Invalid input(s): PROCALCITONIN,  WBC,  LACTICIDVEN Microbiology Recent Results (from the past 240 hour(s))  SARS Coronavirus 2 by RT PCR (hospital order, performed in [facility] hospital lab) Nasopharyngeal Nasopharyngeal Swab     Status: None   Collection Time: 07/15/20  2:56 PM   Specimen: Nasopharyngeal Swab  Result Value Ref Range Status   SARS Coronavirus 2 NEGATIVE NEGATIVE Final    Comment: (NOTE) SARS-CoV-2 target nucleic acids are NOT DETECTED.  The SARS-CoV-2 RNA is generally detectable in upper and lower respiratory specimens during the acute phase of infection. The lowest concentration of SARS-CoV-2 viral copies this assay can detect is 250 copies / mL. A negative result does not preclude SARS-CoV-2 infection and should not be used as the sole basis for treatment or other patient management decisions.  A negative result may occur with improper specimen collection / handling, submission of specimen other than nasopharyngeal swab, presence  of viral mutation(s) within the areas targeted by this assay, and inadequate number of viral copies (<250 copies / mL). A negative result must be combined with clinical observations, patient history, and epidemiological information.  Fact Sheet for Patients:   StrictlyIdeas.no  Fact Sheet for Healthcare Providers: BankingDealers.co.za  This test is not yet approved or  cleared by the [place] FDA and has been authorized for detection and/or diagnosis of SARS-CoV-2 by FDA under an Emergency Use Authorization (EUA).  This EUA will remain in effect (meaning this test can be used) for the duration of the COVID-19 declaration under Section 564(b)(1) of the Act, 21 U.S.C. section 360bbb-3(b)(1), unless the authorization is terminated or revoked sooner.  Performed at [facility], [place] [address] [place], [place] [address]   Surgical PCR screen     Status: None   Collection Time: 07/19/20 10:21 PM   Specimen: Nasal Mucosa; Nasal Swab  Result Value Ref Range Status   MRSA, PCR NEGATIVE NEGATIVE Final   Staphylococcus aureus NEGATIVE NEGATIVE Final    Comment: (NOTE) The Xpert SA Assay (FDA approved for NASAL specimens in patients 65 years of age and older), is one component of a comprehensive surveillance program. It is not intended to diagnose infection nor to guide or monitor treatment. Performed at [facility], [place] [address] [place], [place] [address]      Time coordinating discharge: 25  minutes  SIGNED: Antonieta Pert, MD  [facility] 07/21/2020, 2:45 PM  If 7PM-7AM, please contact night-coverage [email or web address]

## 2020-07-21 NOTE — Progress Notes (Signed)
Pt ambulated in hall with walker and did well overall. Pt stated her gas pain improved after walking in hall. No needs at this time.

## 2020-07-21 NOTE — Progress Notes (Signed)
Called for kpad ordered to be brought to room. None available at this time, as soon as one is- it will be delivered to the room.

## 2020-07-21 NOTE — Progress Notes (Signed)
Patient given a copy of the Code 44 documentation.

## 2020-07-21 NOTE — Care Management CC44 (Signed)
Condition Code 44 Documentation Completed  Patient Details  Name: Christina Mcbride MRN: [id number] Date of Birth: December 19, 1970   Condition Code 44 given:  Yes Patient signature on Condition Code 44 notice:  Yes Documentation of 2 MD's agreement:  Yes Code 44 added to claim:  Yes    Lia Hopping, LCSW 07/21/2020, 9:36 AM

## 2020-07-21 NOTE — Plan of Care (Signed)
  Problem: Education: Goal: Knowledge of General Education information will improve Description: Including pain rating scale, medication(s)/side effects and non-pharmacologic comfort measures 07/21/2020 1648 by Deetta Perla, RN Outcome: Adequate for Discharge 07/21/2020 1644 by Galia Rahm, Helane Gunther, RN Outcome: Progressing   Problem: Health Behavior/Discharge Planning: Goal: Ability to manage health-related needs will improve 07/21/2020 1648 by Gradie Butrick, Helane Gunther, RN Outcome: Adequate for Discharge 07/21/2020 1644 by Iona Stay, Helane Gunther, RN Outcome: Progressing   Problem: Clinical Measurements: Goal: Ability to maintain clinical measurements within normal limits will improve 07/21/2020 1648 by Markis Langland, Helane Gunther, RN Outcome: Adequate for Discharge 07/21/2020 1644 by Deetta Perla, RN Outcome: Progressing Goal: Will remain free from infection 07/21/2020 1648 by Deetta Perla, RN Outcome: Adequate for Discharge 07/21/2020 1644 by Deetta Perla, RN Outcome: Progressing Goal: Diagnostic test results will improve 07/21/2020 1648 by Deetta Perla, RN Outcome: Adequate for Discharge 07/21/2020 1644 by Deetta Perla, RN Outcome: Progressing Goal: Respiratory complications will improve 07/21/2020 1648 by Deetta Perla, RN Outcome: Adequate for Discharge 07/21/2020 1644 by Deetta Perla, RN Outcome: Progressing Goal: Cardiovascular complication will be avoided 07/21/2020 1648 by Deetta Perla, RN Outcome: Adequate for Discharge 07/21/2020 1644 by Deetta Perla, RN Outcome: Progressing   Problem: Nutrition: Goal: Adequate nutrition will be maintained 07/21/2020 1648 by Deetta Perla, RN Outcome: Adequate for Discharge 07/21/2020 1644 by Deetta Perla, RN Outcome: Progressing   Problem: Coping: Goal: Level of anxiety will decrease 07/21/2020 1648 by Deetta Perla, RN Outcome: Adequate for Discharge 07/21/2020 1644 by Deetta Perla, RN Outcome:  Progressing   Problem: Elimination: Goal: Will not experience complications related to bowel motility 07/21/2020 1648 by Deetta Perla, RN Outcome: Adequate for Discharge 07/21/2020 1644 by Deetta Perla, RN Outcome: Progressing   Problem: Pain Managment: Goal: General experience of comfort will improve 07/21/2020 1648 by Deetta Perla, RN Outcome: Adequate for Discharge 07/21/2020 1644 by Deetta Perla, RN Outcome: Progressing   Problem: Safety: Goal: Ability to remain free from injury will improve 07/21/2020 1648 by Deetta Perla, RN Outcome: Adequate for Discharge 07/21/2020 1644 by Tymarion Everard, Helane Gunther, RN Outcome: Progressing

## 2020-07-21 NOTE — Care Management Obs Status (Signed)
[place] NOTIFICATION   Patient Details  Name: Christina Mcbride MRN: [id number] Date of Birth: 07-22-1971   Medicare Observation Status Notification Given:  Yes    Lia Hopping, LCSW 07/21/2020, 9:36 AM

## 2020-07-21 NOTE — Plan of Care (Signed)
  Problem: Education: Goal: Knowledge of General Education information will improve Description: Including pain rating scale, medication(s)/side effects and non-pharmacologic comfort measures Outcome: Progressing   Problem: Health Behavior/Discharge Planning: Goal: Ability to manage health-related needs will improve Outcome: Progressing   Problem: Clinical Measurements: Goal: Ability to maintain clinical measurements within normal limits will improve Outcome: Progressing Goal: Will remain free from infection Outcome: Progressing Goal: Diagnostic test results will improve Outcome: Progressing Goal: Respiratory complications will improve Outcome: Progressing Goal: Cardiovascular complication will be avoided Outcome: Progressing   Problem: Nutrition: Goal: Adequate nutrition will be maintained Outcome: Progressing   Problem: Coping: Goal: Level of anxiety will decrease Outcome: Progressing   Problem: Elimination: Goal: Will not experience complications related to bowel motility Outcome: Progressing   Problem: Pain Managment: Goal: General experience of comfort will improve Outcome: Progressing   Problem: Safety: Goal: Ability to remain free from injury will improve Outcome: Progressing   

## 2020-07-22 ENCOUNTER — Telehealth: Payer: Self-pay

## 2020-07-22 NOTE — Telephone Encounter (Signed)
Transition Care Management Follow-up Telephone Call  Date of discharge and from where: 07/21/2020, [facility]   How have you been since you were released from the hospital? She has been having pain with movement, position changes but she has not taken any pain medication yet.  She still needs to pick up her meds from the pharmacy and has someone that can pick them up for her today. She has not had a bowel movement since coming home. She has the phone number for the surgeon if she has questions.    Any questions or concerns?  noted above  Items Reviewed:  Did the pt receive and understand the discharge instructions provided?  yes  Medications obtained and verified? She does not have any medications and said that they would be picked up today.  She did not have any questions about the med regime. She is concerned about how she will be able to swallow pills  Any new allergies since your discharge?  none reported   Dietary orders reviewed? she said she has been maintaining a liquid diet  Do you have support at home?  yes.  No home health or DME ordered  She said that her surgical sites hurt but there is no draining from the areas.    Functional Questionnaire: (I = Independent and D = Dependent) ADLs:independent  Follow up appointments reviewed:   PCP Hospital f/u appt confirmed?  [name], NP 07/28/2020  Specialist Hospital f/u appt confirmed? Surgery 08/11/2020  Are transportation arrangements needed?  Reminded her that she may be eligible for transportation to medical appointments with [facility] and Medicaid  If their condition worsens, is the pt aware to call PCP or go to the Emergency Dept.?  yes  Was the patient provided with contact information for the PCP's office or ED?  she has the phone number for [facility]  Was to pt encouraged to call back with questions or concerns?  yes

## 2020-07-28 ENCOUNTER — Ambulatory Visit (INDEPENDENT_AMBULATORY_CARE_PROVIDER_SITE_OTHER): Payer: Medicare HMO | Admitting: Primary Care

## 2020-07-28 ENCOUNTER — Other Ambulatory Visit: Payer: Self-pay

## 2020-07-28 ENCOUNTER — Encounter (INDEPENDENT_AMBULATORY_CARE_PROVIDER_SITE_OTHER): Payer: Self-pay | Admitting: Primary Care

## 2020-07-28 VITALS — BP 120/80 | HR 94 | Temp 98.2°F | Resp 18 | Ht 67.0 in | Wt 265.0 lb

## 2020-07-28 DIAGNOSIS — K449 Diaphragmatic hernia without obstruction or gangrene: Secondary | ICD-10-CM | POA: Diagnosis not present

## 2020-07-28 DIAGNOSIS — Z09 Encounter for follow-up examination after completed treatment for conditions other than malignant neoplasm: Secondary | ICD-10-CM | POA: Diagnosis not present

## 2020-07-28 DIAGNOSIS — D500 Iron deficiency anemia secondary to blood loss (chronic): Secondary | ICD-10-CM | POA: Diagnosis not present

## 2020-07-28 DIAGNOSIS — Z1231 Encounter for screening mammogram for malignant neoplasm of breast: Secondary | ICD-10-CM | POA: Diagnosis not present

## 2020-07-28 DIAGNOSIS — K219 Gastro-esophageal reflux disease without esophagitis: Secondary | ICD-10-CM

## 2020-07-28 DIAGNOSIS — Z1159 Encounter for screening for other viral diseases: Secondary | ICD-10-CM

## 2020-07-28 DIAGNOSIS — Z23 Encounter for immunization: Secondary | ICD-10-CM

## 2020-07-28 MED ORDER — ESOMEPRAZOLE MAGNESIUM 20 MG PO CPDR: 20.0000 mg | DELAYED_RELEASE_CAPSULE | Freq: Every day | ORAL | 1 refills | Status: DC

## 2020-07-28 NOTE — Progress Notes (Signed)
HFU and blood work

## 2020-07-28 NOTE — Progress Notes (Signed)
Assessment and Plan: Diagnoses and all orders for this visit:  Hospital discharge follow-up Recommendations for Outpatient Follow-up:  Retrieved from discharge summary  1. Follow up with PCP in 1-2 weeks 2. Please obtain BMP/CBC in one week 3. Please follow up on the following pending results:  Iron deficiency anemia due to chronic blood loss -     CBC with Differential  Hiatal hernia  Encounter for screening mammogram for malignant neoplasm of breast Refer for diagnostic mammogram      HPI Ms.Christina Mcbride 49 y.o.female presents for follow up from the hospital. Admit date to the hospital was 07/15/20, patient was discharged from the hospital on 07/21/20, patient was admitted for: symptomatic iron deficiency anemia and Large hiatal hernia status post robotic laparoscopic fundoplication with mesh 1/22  Past Medical History:  Diagnosis Date  . Amenia 11/2018  . Blood transfusion without reported diagnosis   . COVID-19 virus infection - May 2021 04/15/2020  . Migraine   . PTSD (post-traumatic stress disorder)    reports daughter was murdered   . Tachycardia   . Thyroid disease   . Vitamin D deficiency      Allergies  Allergen Reactions  . Bee Venom Anaphylaxis      Current Outpatient Medications on File Prior to Visit  Medication Sig Dispense Refill  . acetaminophen (TYLENOL) 325 MG tablet Take 2 tablets (650 mg total) by mouth every 6 (six) hours as needed for mild pain (or Fever >/= 101).    . folic acid (FOLVITE) 1 MG tablet Take 1 mg by mouth daily.    . Multiple Vitamin (MULTIVITAMIN ADULT) TABS Take 2 tablets by mouth daily.    Marland Kitchen oxyCODONE (ROXICODONE) 5 MG/5ML solution Take 5 mLs (5 mg total) by mouth every 6 (six) hours as needed for severe pain. 100 mL 0  . pantoprazole (PROTONIX) 40 MG tablet Take 1 tablet (40 mg total) by mouth daily before breakfast. 30 tablet 0   No current facility-administered medications on file prior to visit.    ROS: all negative  except above.   Physical Exam: Filed Weights   07/28/20 1458  Weight: 265 lb (120.2 kg)   BP 120/80   Pulse 94   Temp 98.2 F (36.8 C)   Resp 18   Ht 5\' 7"  (1.702 m)   Wt 265 lb (120.2 kg)   LMP 05/04/2016   SpO2 98%   BMI 41.50 kg/m  General Appearance: Well nourished, obese femal in no apparent distress. Eyes: PERRLA, EOMs, conjunctiva no swelling or erythema Sinuses: No Frontal/maxillary tenderness ENT/Mouth: Ext aud canals clear, Neck: Supple, thyroid normal.  Respiratory: Respiratory effort normal, BS equal bilaterally without rales, rhonchi, wheezing or stridor.  Cardio: RRR with no MRGs. Brisk peripheral pulses without edema.  Abdomen: Soft, + BS.  Non tender, no guarding, rebound, hernias, masses. Lymphatics: Non tender without lymphadenopathy.  Musculoskeletal: Full ROM, 5/5 strength, normal gait.  Skin: Warm, dry without rashes, lesions, ecchymosis.  Neuro: Cranial nerves intact. Normal muscle tone, no cerebellar symptoms. Sensation intact.  Psych: Awake and oriented X 3, normal affect, Insight and Judgment appropriate.     Kerin Perna, NP 3:21 PM

## 2020-07-28 NOTE — Patient Instructions (Signed)

## 2020-07-29 ENCOUNTER — Other Ambulatory Visit (INDEPENDENT_AMBULATORY_CARE_PROVIDER_SITE_OTHER): Payer: Self-pay | Admitting: Primary Care

## 2020-07-29 LAB — CBC WITH DIFFERENTIAL/PLATELET
Basophils Absolute: 0.1 10*3/uL (ref 0.0–0.2)
Basos: 1 %
EOS (ABSOLUTE): 0.2 10*3/uL (ref 0.0–0.4)
Eos: 2 %
Hematocrit: 36.1 % (ref 34.0–46.6)
Hemoglobin: 10.6 g/dL — ABNORMAL LOW (ref 11.1–15.9)
Immature Grans (Abs): 0 10*3/uL (ref 0.0–0.1)
Immature Granulocytes: 0 %
Lymphocytes Absolute: 1.5 10*3/uL (ref 0.7–3.1)
Lymphs: 19 %
MCH: 21.4 pg — ABNORMAL LOW (ref 26.6–33.0)
MCHC: 29.4 g/dL — ABNORMAL LOW (ref 31.5–35.7)
MCV: 73 fL — ABNORMAL LOW (ref 79–97)
Monocytes Absolute: 0.5 10*3/uL (ref 0.1–0.9)
Monocytes: 7 %
Neutrophils Absolute: 5.5 10*3/uL (ref 1.4–7.0)
Neutrophils: 71 %
Platelets: 567 10*3/uL — ABNORMAL HIGH (ref 150–450)
RBC: 4.96 x10E6/uL (ref 3.77–5.28)
RDW: 27.4 % — ABNORMAL HIGH (ref 11.7–15.4)
WBC: 7.8 10*3/uL (ref 3.4–10.8)

## 2020-07-29 LAB — HEPATITIS C ANTIBODY: Hep C Virus Ab: <0.1 s/co ratio (ref 0.0–0.9)

## 2020-07-29 MED ORDER — FERROUS SULFATE 325 (65 FE) MG PO TABS: 325.0000 mg | ORAL_TABLET | Freq: Every day | ORAL | 3 refills | Status: DC

## 2020-12-20 ENCOUNTER — Other Ambulatory Visit: Payer: Self-pay | Admitting: Primary Care

## 2020-12-20 DIAGNOSIS — Z1231 Encounter for screening mammogram for malignant neoplasm of breast: Secondary | ICD-10-CM

## 2021-01-03 ENCOUNTER — Ambulatory Visit (INDEPENDENT_AMBULATORY_CARE_PROVIDER_SITE_OTHER): Payer: Medicare HMO | Admitting: Primary Care

## 2021-01-04 ENCOUNTER — Other Ambulatory Visit: Payer: Self-pay | Admitting: Primary Care

## 2021-01-04 DIAGNOSIS — Z1231 Encounter for screening mammogram for malignant neoplasm of breast: Secondary | ICD-10-CM

## 2021-01-11 ENCOUNTER — Encounter: Payer: Self-pay | Admitting: Gastroenterology

## 2021-01-13 ENCOUNTER — Ambulatory Visit (INDEPENDENT_AMBULATORY_CARE_PROVIDER_SITE_OTHER): Payer: Medicare HMO | Admitting: Primary Care

## 2021-01-13 ENCOUNTER — Encounter (INDEPENDENT_AMBULATORY_CARE_PROVIDER_SITE_OTHER): Payer: Self-pay

## 2021-04-18 ENCOUNTER — Encounter (HOSPITAL_COMMUNITY): Payer: Self-pay

## 2021-04-18 ENCOUNTER — Other Ambulatory Visit: Payer: Self-pay

## 2021-04-18 ENCOUNTER — Emergency Department (HOSPITAL_COMMUNITY)
Admission: EM | Admit: 2021-04-18 | Discharge: 2021-04-18 | Disposition: A | Discharge status: Home / Self Care | Payer: Medicare HMO | Attending: Emergency Medicine | Admitting: Emergency Medicine

## 2021-04-18 ENCOUNTER — Emergency Department (HOSPITAL_COMMUNITY): Payer: Medicare HMO

## 2021-04-18 DIAGNOSIS — E039 Hypothyroidism, unspecified: Secondary | ICD-10-CM | POA: Insufficient documentation

## 2021-04-18 DIAGNOSIS — R1084 Generalized abdominal pain: Secondary | ICD-10-CM | POA: Diagnosis present

## 2021-04-18 DIAGNOSIS — Z8616 Personal history of COVID-19: Secondary | ICD-10-CM | POA: Insufficient documentation

## 2021-04-18 DIAGNOSIS — R110 Nausea: Secondary | ICD-10-CM | POA: Insufficient documentation

## 2021-04-18 DIAGNOSIS — M169 Osteoarthritis of hip, unspecified: Secondary | ICD-10-CM

## 2021-04-18 DIAGNOSIS — M1611 Unilateral primary osteoarthritis, right hip: Secondary | ICD-10-CM | POA: Insufficient documentation

## 2021-04-18 HISTORY — DX: Orthostatic hypotension: I95.1

## 2021-04-18 LAB — URINALYSIS, ROUTINE W REFLEX MICROSCOPIC
Bilirubin Urine: NEGATIVE
Glucose, UA: NEGATIVE mg/dL
Ketones, ur: NEGATIVE mg/dL
Leukocytes,Ua: NEGATIVE
Nitrite: NEGATIVE
Protein, ur: NEGATIVE mg/dL
Specific Gravity, Urine: 1.019 (ref 1.005–1.030)
pH: 7 (ref 5.0–8.0)

## 2021-04-18 LAB — COMPREHENSIVE METABOLIC PANEL
ALT: 13 U/L (ref 0–44)
AST: 20 U/L (ref 15–41)
Albumin: 3.5 g/dL (ref 3.5–5.0)
Alkaline Phosphatase: 86 U/L (ref 38–126)
Anion gap: 6 (ref 5–15)
BUN: 15 mg/dL (ref 6–20)
CO2: 26 mmol/L (ref 22–32)
Calcium: 8.6 mg/dL — ABNORMAL LOW (ref 8.9–10.3)
Chloride: 104 mmol/L (ref 98–111)
Creatinine, Ser: 0.86 mg/dL (ref 0.44–1.00)
GFR, Estimated: >60 mL/min (ref >60)
Glucose, Bld: 137 mg/dL — ABNORMAL HIGH (ref 70–99)
Potassium: 4.3 mmol/L (ref 3.5–5.1)
Sodium: 136 mmol/L (ref 135–145)
Total Bilirubin: 0.6 mg/dL (ref 0.3–1.2)
Total Protein: 8.2 g/dL — ABNORMAL HIGH (ref 6.5–8.1)

## 2021-04-18 LAB — CBC
HCT: 41 % (ref 36.0–46.0)
Hemoglobin: 12 g/dL (ref 12.0–15.0)
MCH: 21.9 pg — ABNORMAL LOW (ref 26.0–34.0)
MCHC: 29.3 g/dL — ABNORMAL LOW (ref 30.0–36.0)
MCV: 74.7 fL — ABNORMAL LOW (ref 80.0–100.0)
Platelets: 470 10*3/uL — ABNORMAL HIGH (ref 150–400)
RBC: 5.49 MIL/uL — ABNORMAL HIGH (ref 3.87–5.11)
RDW: 18.6 % — ABNORMAL HIGH (ref 11.5–15.5)
WBC: 7.2 10*3/uL (ref 4.0–10.5)
nRBC: 0 % (ref 0.0–0.2)

## 2021-04-18 LAB — LIPASE, BLOOD: Lipase: 35 U/L (ref 11–51)

## 2021-04-18 NOTE — ED Provider Notes (Signed)
[facility] DEPT Provider Note   CSN: [id number] Arrival date & time: 04/18/21  1025     History Chief Complaint  Patient presents with  . Abdominal Pain  . Nausea  . Hip Pain    Christina Mcbride is a 50 y.o. female.  HPI    50 year old female comes in a chief complaint of nausea, abdominal discomfort and hip pain. She reports that her hip has been bothering her for the several weeks.  She works for [employer], and is constantly on the move.  Her right hip has been bothering her for several months now, but has gotten worse more recently.  In addition she has known history of hernia.  She is having some abdominal fullness, epigastric abdominal pain and some nausea.  She had endoscopy 2 years ago which led to the diagnosis.  She is not taking any medications at this time for hip pain and has been taking as needed medications for her abdominal discomfort.  No chest pain, shortness of breath.  Past Medical History:  Diagnosis Date  . Amenia 11/2018  . Blood transfusion without reported diagnosis   . COVID-19 virus infection - May 2021 04/15/2020  . Migraine   . Orthostatic hypotension   . PTSD (post-traumatic stress disorder)    reports daughter was murdered   . Tachycardia   . Thyroid disease   . Vitamin D deficiency     Patient Active Problem List   Diagnosis Date Noted  . Paraesophageal hiatal hernia 07/16/2020  . Obesity, Class III, BMI 40-49.9 (morbid obesity) ([place]) 07/16/2020  . Occult blood positive stool 07/16/2020  . Acute on chronic blood loss anemia 07/16/2020  . Orthostatic hypotension 11/29/2018  . Chronic daily headache 06/08/2014  . Gastroesophageal reflux disease without esophagitis 06/08/2014  . Iron deficiency anemia due to chronic blood loss 03/06/2014  . Left shoulder pain 03/06/2014  . Anemia 12/27/2013  . Symptomatic anemia 12/27/2013  . Tachycardia 12/27/2013  . Bilateral leg edema 12/27/2013  . Goiter  12/27/2013  . Dizziness 12/27/2013  . Generalized weakness 12/27/2013  . Low iron stores 12/27/2013  . UTI (urinary tract infection) 12/27/2013  . PTSD (post-traumatic stress disorder)   . Migraine     Past Surgical History:  Procedure Laterality Date  . BIOPSY  10/17/2019   Procedure: BIOPSY;  Surgeon: Doran Stabler, MD;  Location: [facility];  Service: Gastroenterology;;  . COLONOSCOPY WITH PROPOFOL N/A 10/17/2019   Procedure: COLONOSCOPY WITH PROPOFOL;  Surgeon: Doran Stabler, MD;  Location: [facility];  Service: Gastroenterology;  Laterality: N/A;  . ESOPHAGOGASTRODUODENOSCOPY (EGD) WITH PROPOFOL N/A 10/17/2019   Procedure: ESOPHAGOGASTRODUODENOSCOPY (EGD) WITH PROPOFOL;  Surgeon: Doran Stabler, MD;  Location: [facility];  Service: Gastroenterology;  Laterality: N/A;  . TUBAL LIGATION       OB History   No obstetric history on file.     Family History  Problem Relation Age of Onset  . Hypertension Mother   . Arthritis Mother   . Hypertension Son   . Cancer Maternal Aunt   . Diabetes Maternal Grandmother     Social History   Tobacco Use  . Smoking status: Never Smoker  . Smokeless tobacco: Never Used  Vaping Use  . Vaping Use: Never used  Substance Use Topics  . Alcohol use: No  . Drug use: No    Home Medications Prior to Admission medications   Medication Sig Start Date End Date Taking? Authorizing Provider  acetaminophen (  TYLENOL) 325 MG tablet Take 2 tablets (650 mg total) by mouth every 6 (six) hours as needed for mild pain (or Fever >/= 101). 07/21/20   Meuth, Blaine Hamper, PA-C  esomeprazole (NEXIUM) 20 MG capsule Take 1 capsule (20 mg total) by mouth daily at 12 noon. 07/28/20   Kerin Perna, NP  ferrous sulfate 325 (65 FE) MG tablet Take 1 tablet (325 mg total) by mouth daily with breakfast. 07/29/20   Kerin Perna, NP  folic acid (FOLVITE) 1 MG tablet Take 1 mg by mouth daily.    [provider]  Multiple Vitamin  (MULTIVITAMIN ADULT) TABS Take 2 tablets by mouth daily.    [provider]  oxyCODONE (ROXICODONE) 5 MG/5ML solution Take 5 mLs (5 mg total) by mouth every 6 (six) hours as needed for severe pain. 07/21/20   Meuth, Blaine Hamper, PA-C    Allergies    Bee venom  Review of Systems   Review of Systems  Constitutional: Positive for activity change.  Respiratory: Negative for shortness of breath.   Cardiovascular: Negative for chest pain.  Gastrointestinal: Positive for abdominal pain.  All other systems reviewed and are negative.   Physical Exam Updated Vital Signs BP (!) 147/89   Pulse 75   Temp 98.7 F (37.1 C) (Oral)   Resp 14   Ht 5\' 7"  (1.702 m)   Wt 118.8 kg   LMP 05/04/2016   SpO2 99%   BMI 41.04 kg/m   Physical Exam Vitals and nursing note reviewed.  Constitutional:      Appearance: She is well-developed.  HENT:     Head: Normocephalic and atraumatic.  Eyes:     Pupils: Pupils are equal, round, and reactive to light.  Cardiovascular:     Rate and Rhythm: Normal rate and regular rhythm.     Heart sounds: Normal heart sounds. No murmur heard.   Pulmonary:     Effort: Pulmonary effort is normal. No respiratory distress.  Abdominal:     General: There is no distension.     Palpations: Abdomen is soft.     Tenderness: There is no abdominal tenderness. There is no guarding or rebound.  Musculoskeletal:     Cervical back: Neck supple.  Skin:    General: Skin is warm and dry.  Neurological:     Mental Status: She is alert and oriented to person, place, and time.    Patient has tenderness with active range of motion, active leg raise of the right lower extremity.  No tenderness with passive leg raise.  ED Results / Procedures / Treatments   Labs (all labs ordered are listed, but only abnormal results are displayed) Labs Reviewed  COMPREHENSIVE METABOLIC PANEL - Abnormal; Notable for the following components:      Result Value   Glucose, Bld 137 (*)     Calcium 8.6 (*)    Total Protein 8.2 (*)    All other components within normal limits  CBC - Abnormal; Notable for the following components:   RBC 5.49 (*)    MCV 74.7 (*)    MCH 21.9 (*)    MCHC 29.3 (*)    RDW 18.6 (*)    Platelets 470 (*)    All other components within normal limits  URINALYSIS, ROUTINE W REFLEX MICROSCOPIC - Abnormal; Notable for the following components:   Hgb urine dipstick SMALL (*)    Bacteria, UA RARE (*)    All other components within normal limits  LIPASE, BLOOD    EKG EKG Interpretation  Date/Time:  Monday Apr 18 2021 11:48:25 EDT Ventricular Rate:  72 PR Interval:  142 QRS Duration: 91 QT Interval:  394 QTC Calculation: 432 R Axis:   28 Text Interpretation: Sinus rhythm Borderline T abnormalities, diffuse leads No acute changes No significant change since last tracing Confirmed by Varney Biles [phone]) on 04/18/2021 2:20:48 PM   Radiology DG Hip Unilat W or Wo Pelvis 2-3 Views Right  Result Date: 04/18/2021 CLINICAL DATA:  Right hip pain EXAM: DG HIP (WITH OR WITHOUT PELVIS) 2-3V RIGHT COMPARISON:  None. FINDINGS: There is no evidence of hip fracture or dislocation. There is no evidence of arthropathy or other focal bone abnormality. IMPRESSION: Negative. Electronically Signed   By: Macy Mis M.D.   On: 04/18/2021 12:46    Procedures Procedures   Medications Ordered in ED Medications - No data to display  ED Course  I have reviewed the triage vital signs and the nursing notes.  Pertinent labs & imaging results that were available during my care of the patient were reviewed by me and considered in my medical decision making (see chart for details).    MDM Rules/Calculators/A&P                          50 year old comes in a chief complaint of pain in the abdomen and hip.  X-ray of the hip ordered, no evidence of osteoarthritis.  She could have soft tissue strain/sprain, sacroiliitis.  No radicular pain.  No concerns for septic  arthritis.  X-ray ordered, overall reassuring.  Husband wants her to follow-up with Dr. Darene Lamer, who is primary care and sports medicine, which I think is appropriate.  Epigastric abdominal discomfort appears to be because of her hernia.  Labs are reassuring.  She has passed oral challenge.  Stable for discharge  Final Clinical Impression(s) / ED Diagnoses Final diagnoses:  Generalized abdominal pain  Osteoarthritis of hip, unspecified laterality, unspecified osteoarthritis type    Rx / DC Orders ED Discharge Orders    None       Varney Biles, MD 04/18/21 1604

## 2021-04-18 NOTE — Discharge Instructions (Addendum)
Take ibuprofen and tylenol as you are doing for hip pain. See Dr. Darene Lamer or your primary care doctor for hip pain.

## 2021-04-18 NOTE — ED Triage Notes (Signed)
Patient c/o intermittent mid upper abdominal pain and nausea x 3 days. Patient states she had a hiatal hernia repair since 06/2020.  Patient also c/o right hip pain x months.

## 2021-07-21 ENCOUNTER — Encounter (HOSPITAL_BASED_OUTPATIENT_CLINIC_OR_DEPARTMENT_OTHER): Payer: Self-pay | Admitting: *Deleted

## 2021-07-21 ENCOUNTER — Other Ambulatory Visit: Payer: Self-pay

## 2021-07-21 ENCOUNTER — Emergency Department (HOSPITAL_BASED_OUTPATIENT_CLINIC_OR_DEPARTMENT_OTHER)
Admission: EM | Admit: 2021-07-21 | Discharge: 2021-07-21 | Disposition: A | Discharge status: Home / Self Care | Payer: Medicare HMO | Attending: Emergency Medicine | Admitting: Emergency Medicine

## 2021-07-21 DIAGNOSIS — G43009 Migraine without aura, not intractable, without status migrainosus: Secondary | ICD-10-CM | POA: Insufficient documentation

## 2021-07-21 DIAGNOSIS — R519 Headache, unspecified: Secondary | ICD-10-CM | POA: Diagnosis present

## 2021-07-21 DIAGNOSIS — Z8616 Personal history of COVID-19: Secondary | ICD-10-CM | POA: Diagnosis not present

## 2021-07-21 LAB — CBC
HCT: 39.5 % (ref 36.0–46.0)
Hemoglobin: 11.9 g/dL — ABNORMAL LOW (ref 12.0–15.0)
MCH: 22.2 pg — ABNORMAL LOW (ref 26.0–34.0)
MCHC: 30.1 g/dL (ref 30.0–36.0)
MCV: 73.6 fL — ABNORMAL LOW (ref 80.0–100.0)
Platelets: 541 10*3/uL — ABNORMAL HIGH (ref 150–400)
RBC: 5.37 MIL/uL — ABNORMAL HIGH (ref 3.87–5.11)
RDW: 18.6 % — ABNORMAL HIGH (ref 11.5–15.5)
WBC: 9.2 10*3/uL (ref 4.0–10.5)
nRBC: 0 % (ref 0.0–0.2)

## 2021-07-21 MED ORDER — METOCLOPRAMIDE HCL 5 MG/ML IJ SOLN
10.0000 mg | Freq: Once | INTRAMUSCULAR | Status: AC
  Administered 2021-07-21: 10 mg via INTRAVENOUS
  Filled 2021-07-21: qty 2

## 2021-07-21 MED ORDER — BUTALBITAL-APAP-CAFFEINE 50-325-40 MG PO TABS
1.0000 | ORAL_TABLET | Freq: Once | ORAL | Status: DC
  Filled 2021-07-21: qty 1

## 2021-07-21 MED ORDER — DIPHENHYDRAMINE HCL 50 MG/ML IJ SOLN
25.0000 mg | Freq: Once | INTRAMUSCULAR | Status: AC
  Administered 2021-07-21: 25 mg via INTRAVENOUS
  Filled 2021-07-21: qty 1

## 2021-07-21 MED ORDER — SODIUM CHLORIDE 0.9 % IV BOLUS
1000.0000 mL | Freq: Once | INTRAVENOUS | Status: AC
  Administered 2021-07-21: 1000 mL via INTRAVENOUS

## 2021-07-21 MED ORDER — KETOROLAC TROMETHAMINE 15 MG/ML IJ SOLN: 15.0000 mg | Freq: Once | INTRAMUSCULAR | Status: DC

## 2021-07-21 NOTE — ED Triage Notes (Signed)
[facility] report-pt was seated in car with AC on after work waiting for husband-onset of migraine x 20 min-pt with hx of migraine with none x 2 months-pt denies LOC, head injury and no sick contacts-VSS were stable-CBG WNL but could not recall number-pt to ED via stretcher into w/c to ED lobby

## 2021-07-21 NOTE — ED Triage Notes (Signed)
Brought in by ems for migraine x 20 mins , hx of same, no meds PTA

## 2021-07-21 NOTE — Discharge Instructions (Addendum)
You were seen in the emergency department today for migraine.  Based on the history provided and my physical exam along with improvement with medication it is reassuring to send you home at this time.  At the emergency department today we gave you Benadryl and Reglan.  We also gave you a liter of IV fluids to ensure you are adequately hydrated.  I have attached migraine headache information to your discharge instructions.  Please follow-up with your primary care provider as needed.  Please return to the emergency department for any reason.  Specifically regarding her headache, and flag symptoms would include sudden onset severe headache, nausea and vomiting with headache, changes to vision, syncope, difficulty walking or strokelike symptoms.  Thank you for trusting [place] with your care today.

## 2021-07-21 NOTE — ED Provider Notes (Signed)
[facility] EMERGENCY DEPARTMENT Provider Note   CSN: [id number] Arrival date & time: 07/21/21  1607     History Chief Complaint  Patient presents with   Migraine    Christina Mcbride is a 50 y.o. female.  With past medical history of migraine who presents emergency department with complaint of headache. States that headache began gradually 30 minutes prior to arrival to the emergency department.  Describes the headache as bilateral frontal headache that is pounding and constant.  States that it radiates to the front of her eyes.  Associated with photophobia, lightheadedness and nausea without vomiting.  Denies taking any medications prior to arrival.  States that dark room improves headache slightly.  Denies blurry vision or double vision, falls, head trauma, hitting head, neck pain or stiffness, fevers, confusion.  Denies anticoagulation use.  Eats that she does not take medications at home for migraines.  She does not see a neurologist.  The history is provided by the patient.  Migraine This is a recurrent problem. The current episode started less than 1 hour ago. The problem occurs constantly. The problem has not changed since onset.Associated symptoms include headaches. The symptoms are relieved by relaxation and rest. She has tried nothing for the symptoms.   Past Medical History:  Diagnosis Date   Amenia 11/2018   Blood transfusion without reported diagnosis    COVID-19 virus infection - May 2021 04/15/2020   Migraine    Orthostatic hypotension    PTSD (post-traumatic stress disorder)    reports daughter was murdered    Tachycardia    Thyroid disease    Vitamin D deficiency     Patient Active Problem List   Diagnosis Date Noted   Paraesophageal hiatal hernia 07/16/2020   Obesity, Class III, BMI 40-49.9 (morbid obesity) ([place]) 07/16/2020   Occult blood positive stool 07/16/2020   Acute on chronic blood loss anemia 07/16/2020   Orthostatic hypotension  11/29/2018   Chronic daily headache 06/08/2014   Gastroesophageal reflux disease without esophagitis 06/08/2014   Iron deficiency anemia due to chronic blood loss 03/06/2014   Left shoulder pain 03/06/2014   Anemia 12/27/2013   Symptomatic anemia 12/27/2013   Tachycardia 12/27/2013   Bilateral leg edema 12/27/2013   Goiter 12/27/2013   Dizziness 12/27/2013   Generalized weakness 12/27/2013   Low iron stores 12/27/2013   UTI (urinary tract infection) 12/27/2013   PTSD (post-traumatic stress disorder)    Migraine     Past Surgical History:  Procedure Laterality Date   BIOPSY  10/17/2019   Procedure: BIOPSY;  Surgeon: Doran Stabler, MD;  Location: [facility];  Service: Gastroenterology;;   COLONOSCOPY WITH PROPOFOL N/A 10/17/2019   Procedure: COLONOSCOPY WITH PROPOFOL;  Surgeon: Doran Stabler, MD;  Location: [facility];  Service: Gastroenterology;  Laterality: N/A;   ESOPHAGOGASTRODUODENOSCOPY (EGD) WITH PROPOFOL N/A 10/17/2019   Procedure: ESOPHAGOGASTRODUODENOSCOPY (EGD) WITH PROPOFOL;  Surgeon: Doran Stabler, MD;  Location: [facility];  Service: Gastroenterology;  Laterality: N/A;   TUBAL LIGATION       OB History   No obstetric history on file.    Family History  Problem Relation Age of Onset   Hypertension Mother    Arthritis Mother    Hypertension Son    Cancer Maternal Aunt    Diabetes Maternal Grandmother    Social History   Tobacco Use   Smoking status: Never   Smokeless tobacco: Never  Vaping Use   Vaping Use: Never used  Substance Use Topics   Alcohol use: No   Drug use: No   Home Medications Prior to Admission medications   Medication Sig Start Date End Date Taking? Authorizing Provider  acetaminophen (TYLENOL) 325 MG tablet Take 2 tablets (650 mg total) by mouth every 6 (six) hours as needed for mild pain (or Fever >/= 101). 07/21/20   Meuth, Blaine Hamper, PA-C  esomeprazole (NEXIUM) 20 MG capsule Take 1 capsule (20 mg total) by mouth  daily at 12 noon. 07/28/20   Kerin Perna, NP  ferrous sulfate 325 (65 FE) MG tablet Take 1 tablet (325 mg total) by mouth daily with breakfast. 07/29/20   Kerin Perna, NP  folic acid (FOLVITE) 1 MG tablet Take 1 mg by mouth daily.    [provider]  Multiple Vitamin (MULTIVITAMIN ADULT) TABS Take 2 tablets by mouth daily.    [provider]  oxyCODONE (ROXICODONE) 5 MG/5ML solution Take 5 mLs (5 mg total) by mouth every 6 (six) hours as needed for severe pain. 07/21/20   Meuth, Blaine Hamper, PA-C   Allergies    Bee venom  Review of Systems   Review of Systems  Constitutional:  Negative for fever.  HENT: Negative.    Eyes:  Positive for photophobia. Negative for visual disturbance.  Respiratory: Negative.    Cardiovascular: Negative.   Gastrointestinal: Negative.   Endocrine: Negative.   Genitourinary: Negative.   Musculoskeletal:  Negative for neck pain and neck stiffness.  Skin: Negative.   Allergic/Immunologic: Negative.   Neurological:  Positive for light-headedness and headaches. Negative for dizziness, syncope, weakness and numbness.  Hematological: Negative.   Psychiatric/Behavioral: Negative.     Physical Exam Updated Vital Signs BP (!) 153/102   Pulse 91   Temp 98.3 F (36.8 C) (Oral)   Resp 16   Ht '5\' 7"'$  (1.702 m)   Wt 120.2 kg   LMP 05/04/2016   SpO2 98%   BMI 41.50 kg/m   Physical Exam Vitals and nursing note reviewed.  Constitutional:      General: She is not in acute distress.    Appearance: Normal appearance. She is not toxic-appearing.  HENT:     Head: Normocephalic and atraumatic.     Nose: Nose normal.     Mouth/Throat:     Mouth: Mucous membranes are moist.     Pharynx: Oropharynx is clear.  Eyes:     Extraocular Movements: Extraocular movements intact.     Conjunctiva/sclera: Conjunctivae normal.     Pupils: Pupils are equal, round, and reactive to light.  Cardiovascular:     Rate and Rhythm: Normal rate and regular  rhythm.     Pulses: Normal pulses.     Heart sounds: Normal heart sounds.  Pulmonary:     Effort: Pulmonary effort is normal.     Breath sounds: Normal breath sounds.  Musculoskeletal:     Cervical back: Normal range of motion and neck supple. No rigidity.  Lymphadenopathy:     Cervical: No cervical adenopathy.  Skin:    General: Skin is warm and dry.     Capillary Refill: Capillary refill takes less than 2 seconds.  Neurological:     General: No focal deficit present.     Mental Status: She is alert and oriented to person, place, and time. Mental status is at baseline.     GCS: GCS eye subscore is 4. GCS verbal subscore is 5. GCS motor subscore is 6.     Cranial Nerves:  No cranial nerve deficit.     Sensory: No sensory deficit.     Motor: Motor function is intact.     Coordination: Coordination is intact.     Gait: Gait is intact.  Psychiatric:        Attention and Perception: Attention normal. She does not perceive visual hallucinations.        Mood and Affect: Mood normal.        Speech: Speech normal.        Behavior: Behavior normal.        Thought Content: Thought content normal.        Judgment: Judgment normal.   ED Results / Procedures / Treatments   Labs (all labs ordered are listed, but only abnormal results are displayed) Labs Reviewed  CBC - Abnormal; Notable for the following components:      Result Value   RBC 5.37 (*)    Hemoglobin 11.9 (*)    MCV 73.6 (*)    MCH 22.2 (*)    RDW 18.6 (*)    Platelets 541 (*)    All other components within normal limits   EKG None  Radiology No results found.  Procedures Procedures   Medications Ordered in ED Medications  metoCLOPramide (REGLAN) injection 10 mg (10 mg Intravenous Given 07/21/21 1916)  diphenhydrAMINE (BENADRYL) injection 25 mg (25 mg Intravenous Given 07/21/21 1914)  sodium chloride 0.9 % bolus 1,000 mL (1,000 mLs Intravenous New Bag/Given 07/21/21 1918)   ED Course  I have reviewed the triage  vital signs and the nursing notes.  Pertinent labs & imaging results that were available during my care of the patient were reviewed by me and considered in my medical decision making (see chart for details).  CBC with thrombocytosis which appears to be chronic on review of medical record. 1946: Reassessment of the patient she no longer complains of headache at this time.  She is slightly drowsy from Benadryl.  States her husband is able to drive her home. MDM Rules/Calculators/A&P Christina Mcbride 50 year old female who presents to the emergency department with complaint of migraine headache.  Headache is most consistent with migraine without aura.  No headache red flags. neurological exam without evidence of meningismus, altered mental status, focal neurological findings so doubtful for diagnosis of meningitis, encephalitis, stroke.  The patient's presentation is not consistent with an acute intracranial bleed including subarachnoid hemorrhage.  There is no history of trauma so doubt intracranial hemorrhage.  Given history and physical exam temporal arteritis is unlikely as is acute angle-closure glaucoma.  Doubt carotid artery dissection given no focal neurological deficits, no neck trauma or recent neck strain.  Patient with no signs of increased intracranial pressure, ataxia, incontinence making idiopathic intracranial hypertension unlikely.  After reassessment the patient states that she is no longer having a headache at this time.  Her history, physical exam and improvement with fluids and headache cocktail are reassuring.  At this time safe for her to be discharged home. Final Clinical Impression(s) / ED Diagnoses Final diagnoses:  Migraine without aura and without status migrainosus, not intractable   Rx / DC Orders ED Discharge Orders     None        Mickie Hillier, PA-C 07/21/21 1956    Drenda Freeze, MD 07/27/21 2130

## 2021-08-28 IMAGING — DX DG CHEST 1V PORT
1 series · 1 of 1 positions shown · non-contrast
Comparison: 10/15/2019, CT 03/31/2020

CLINICAL DATA: COVID positive

EXAM:
PORTABLE CHEST 1 VIEW

[chest ap]
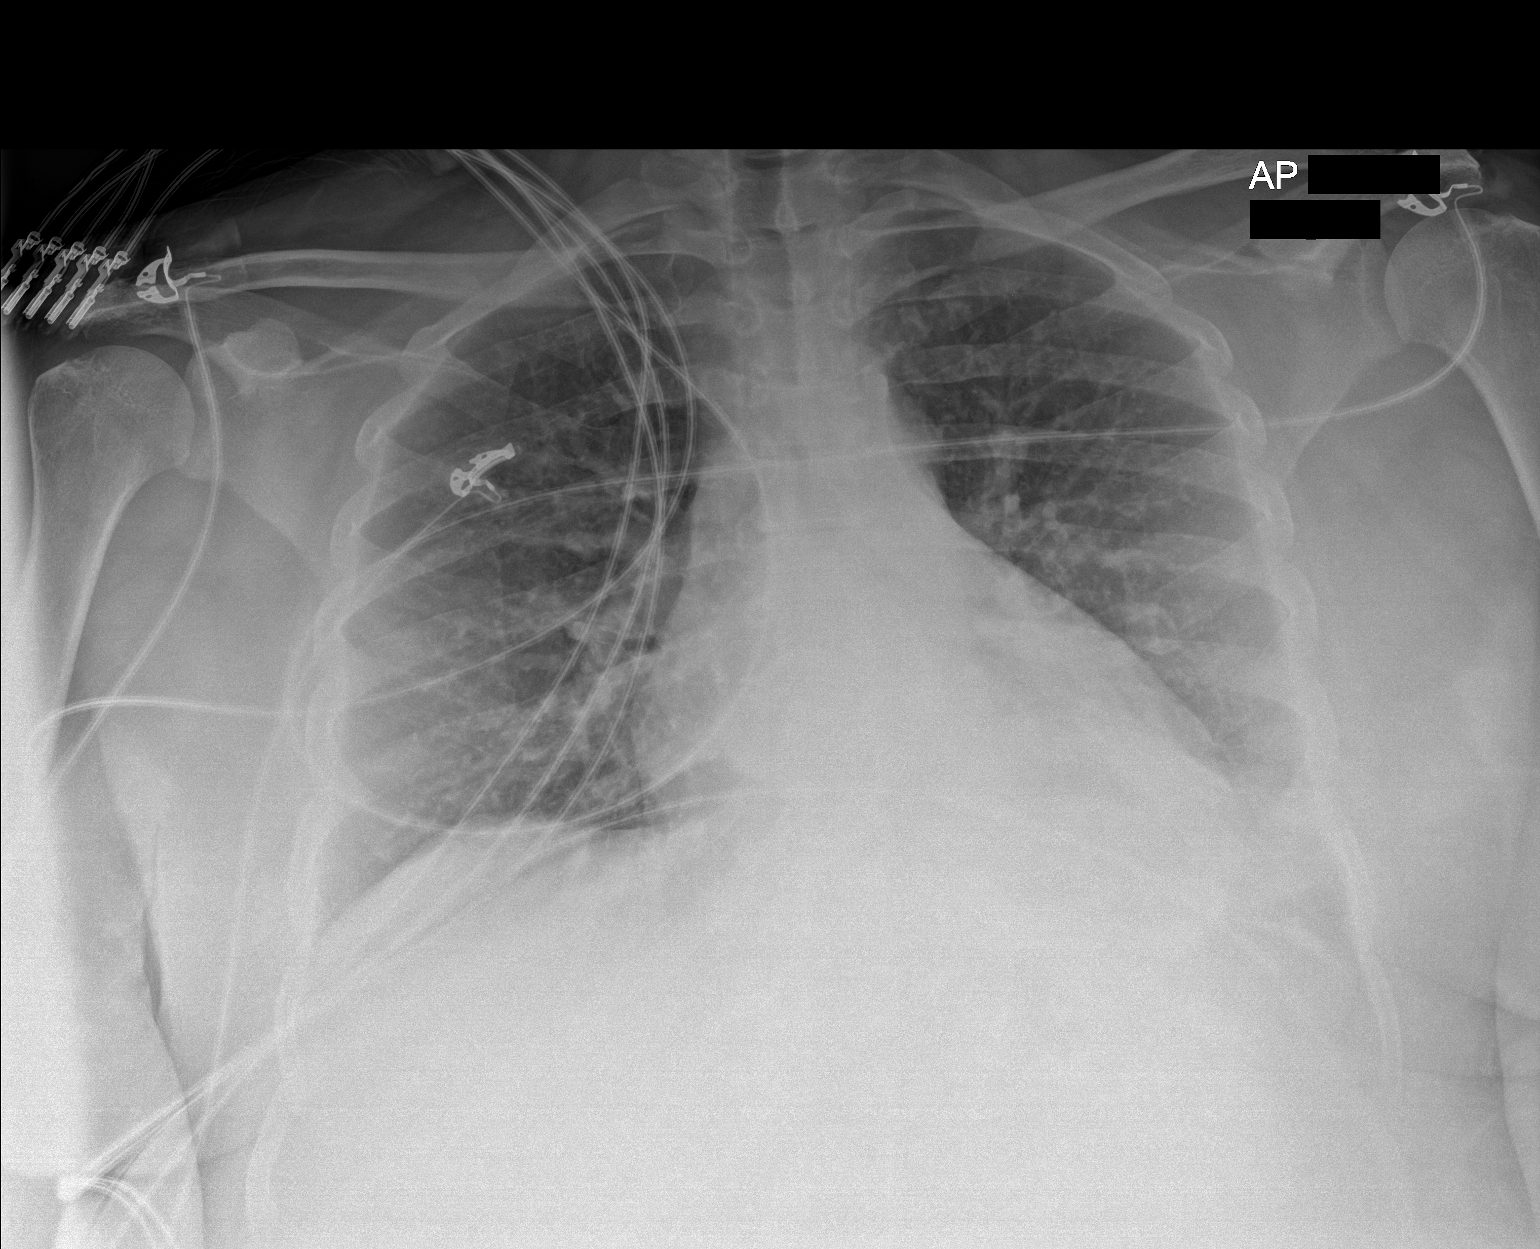

[1 of 1 positions shown; findings below may reference images not displayed]

FINDINGS: Right lung is clear. Heart size is normal. Large hiatal hernia.
Atelectasis at the left base. No pneumothorax.
IMPRESSION: Similar hiatal hernia and atelectasis at the left base.

## 2021-09-06 ENCOUNTER — Emergency Department (HOSPITAL_COMMUNITY): Payer: Medicare HMO

## 2021-09-06 ENCOUNTER — Emergency Department (HOSPITAL_COMMUNITY)
Admission: EM | Admit: 2021-09-06 | Discharge: 2021-09-06 | Disposition: A | Discharge status: Home / Self Care | Payer: Medicare HMO | Attending: Student | Admitting: Student

## 2021-09-06 ENCOUNTER — Other Ambulatory Visit: Payer: Self-pay

## 2021-09-06 ENCOUNTER — Encounter (HOSPITAL_COMMUNITY): Payer: Self-pay

## 2021-09-06 DIAGNOSIS — F410 Panic disorder [episodic paroxysmal anxiety] without agoraphobia: Secondary | ICD-10-CM | POA: Insufficient documentation

## 2021-09-06 DIAGNOSIS — R064 Hyperventilation: Secondary | ICD-10-CM | POA: Diagnosis not present

## 2021-09-06 DIAGNOSIS — R0789 Other chest pain: Secondary | ICD-10-CM | POA: Diagnosis not present

## 2021-09-06 DIAGNOSIS — F419 Anxiety disorder, unspecified: Secondary | ICD-10-CM | POA: Diagnosis present

## 2021-09-06 DIAGNOSIS — Z8616 Personal history of COVID-19: Secondary | ICD-10-CM | POA: Diagnosis not present

## 2021-09-06 DIAGNOSIS — F430 Acute stress reaction: Secondary | ICD-10-CM

## 2021-09-06 LAB — CBC
HCT: 41.3 % (ref 36.0–46.0)
Hemoglobin: 12.4 g/dL (ref 12.0–15.0)
MCH: 22.3 pg — ABNORMAL LOW (ref 26.0–34.0)
MCHC: 30 g/dL (ref 30.0–36.0)
MCV: 74.1 fL — ABNORMAL LOW (ref 80.0–100.0)
Platelets: 534 10*3/uL — ABNORMAL HIGH (ref 150–400)
RBC: 5.57 MIL/uL — ABNORMAL HIGH (ref 3.87–5.11)
RDW: 18.6 % — ABNORMAL HIGH (ref 11.5–15.5)
WBC: 8 10*3/uL (ref 4.0–10.5)
nRBC: 0 % (ref 0.0–0.2)

## 2021-09-06 LAB — BASIC METABOLIC PANEL
Anion gap: 7 (ref 5–15)
BUN: 14 mg/dL (ref 6–20)
CO2: 27 mmol/L (ref 22–32)
Calcium: 8.9 mg/dL (ref 8.9–10.3)
Chloride: 104 mmol/L (ref 98–111)
Creatinine, Ser: 0.91 mg/dL (ref 0.44–1.00)
GFR, Estimated: >60 mL/min (ref >60)
Glucose, Bld: 122 mg/dL — ABNORMAL HIGH (ref 70–99)
Potassium: 4.7 mmol/L (ref 3.5–5.1)
Sodium: 138 mmol/L (ref 135–145)

## 2021-09-06 LAB — TROPONIN I (HIGH SENSITIVITY)
Troponin I (High Sensitivity): 3 ng/L (ref <18)
Troponin I (High Sensitivity): 3 ng/L (ref <18)

## 2021-09-06 MED ORDER — LORAZEPAM 1 MG PO TABS
0.5000 mg | ORAL_TABLET | Freq: Once | ORAL | Status: AC
  Administered 2021-09-06: 0.5 mg via ORAL
  Filled 2021-09-06: qty 1

## 2021-09-06 MED ORDER — LORAZEPAM 1 MG PO TABS
1.0000 mg | ORAL_TABLET | Freq: Once | ORAL | Status: AC
  Administered 2021-09-06: 1 mg via ORAL
  Filled 2021-09-06: qty 1
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
## 2021-09-06 NOTE — ED Notes (Signed)
Pt teaching provided on medications that may cause drowsiness. Pt instructed not to drive or operate heavy machinery while taking the prescribed medication. Pt verbalized understanding.  ? ?Pt provided discharge instructions and prescription information. Pt was given the opportunity to ask questions and questions were answered. Discharge signature not obtained in the setting of the COVID-19 pandemic in order to reduce high touch surfaces.  ? ?
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
## 2021-09-27 ENCOUNTER — Encounter: Payer: Self-pay | Admitting: Internal Medicine

## 2021-12-13 IMAGING — CT CT CHEST-ABD-PELV W/ CM
3 of 5 series · 14 of 36 positions shown, 16 images · IV contrast (APPLIED)
Comparison: Chest radiograph dated 03/31/2020 and CT abdomen pelvis
dated 03/31/2020

CLINICAL DATA: 49-year-old female with chest and upper abdominal
pain. Anemia.

EXAM:
CT CHEST, ABDOMEN, AND PELVIS WITH CONTRAST
TECHNIQUE: Multidetector CT imaging of the chest, abdomen and pelvis was
performed following the standard protocol during bolus
administration of intravenous contrast.
CONTRAST:  100mL OMNIPAQUE IOHEXOL 300 MG/ML  SOLN

[Series 2: cap with · axial · 0.94mm/px · z∈[-717,-212]mm · 9 of 127 slices shown, 11 images]
[im 13/127  mediastinal]
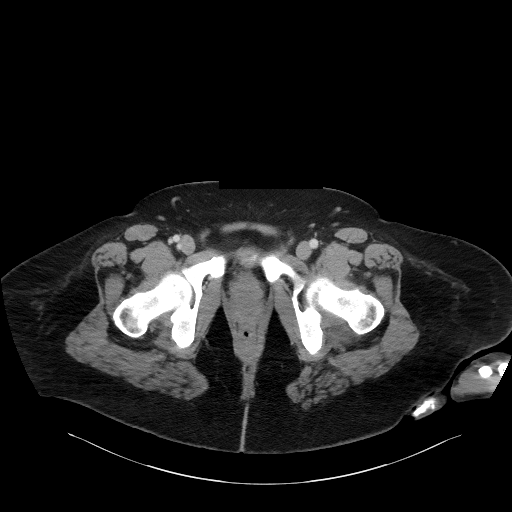
[im 13/127  bone]
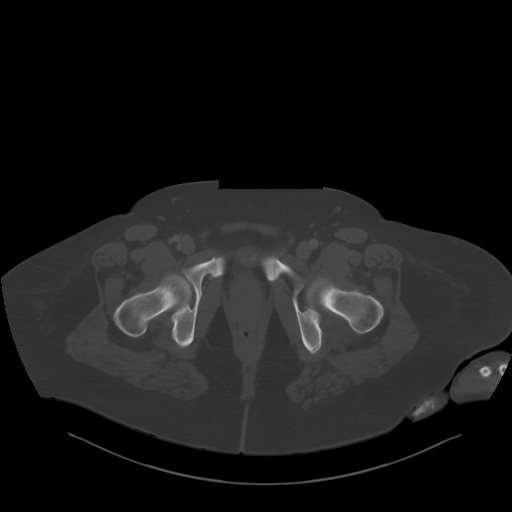
[im 26/127  mediastinal]
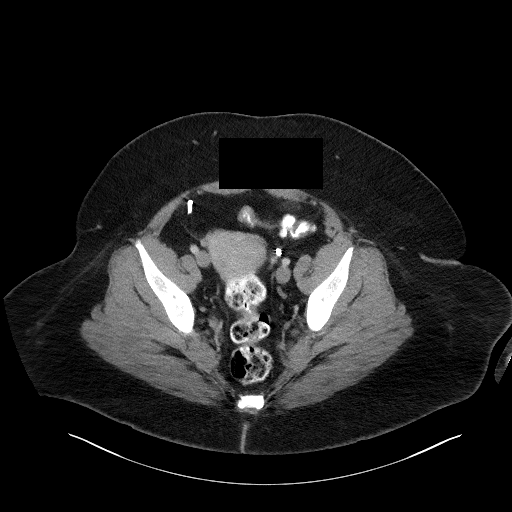
[im 38/127  mediastinal]
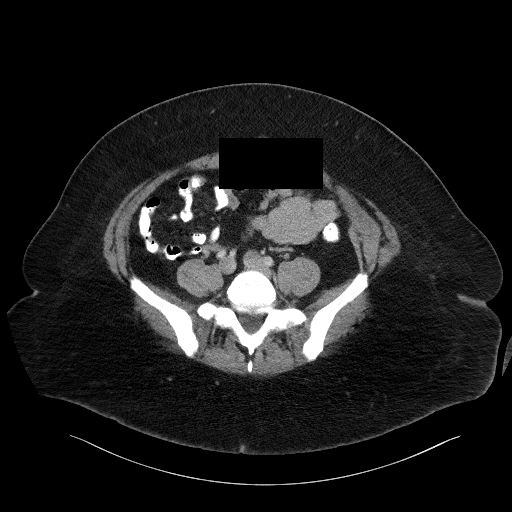
[im 51/127  mediastinal]
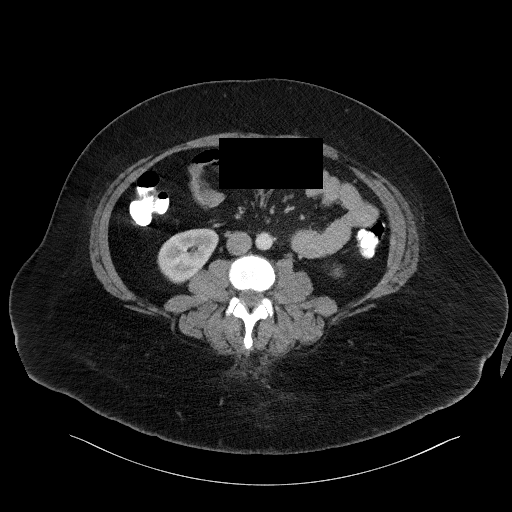
[im 64/127  mediastinal]
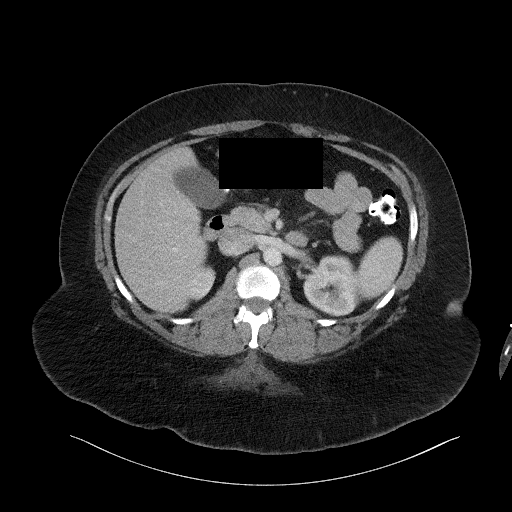
[im 76/127  mediastinal]
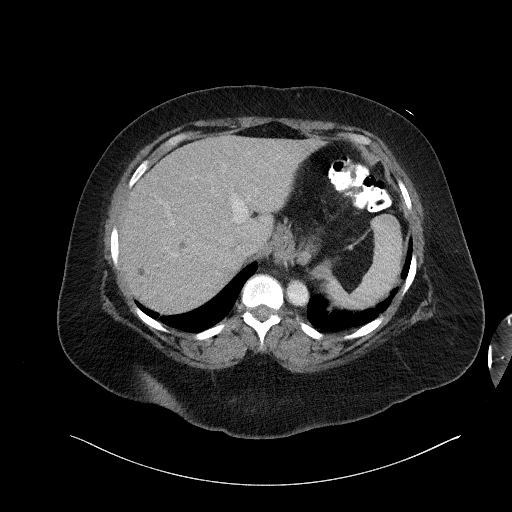
[im 89/127  mediastinal]
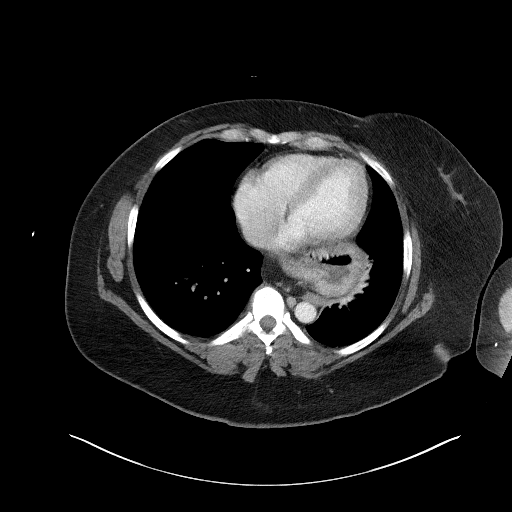
[im 101/127  mediastinal]
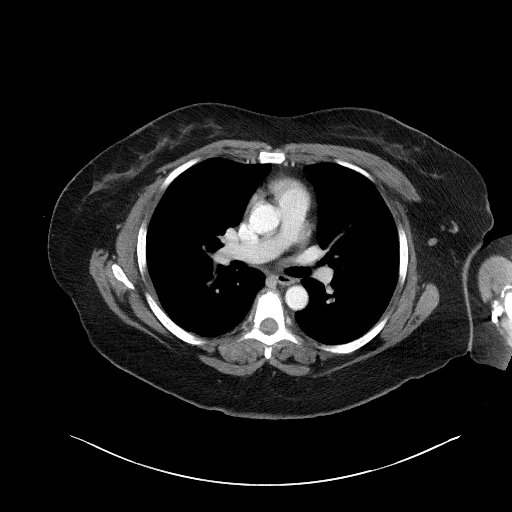
[im 114/127  mediastinal]
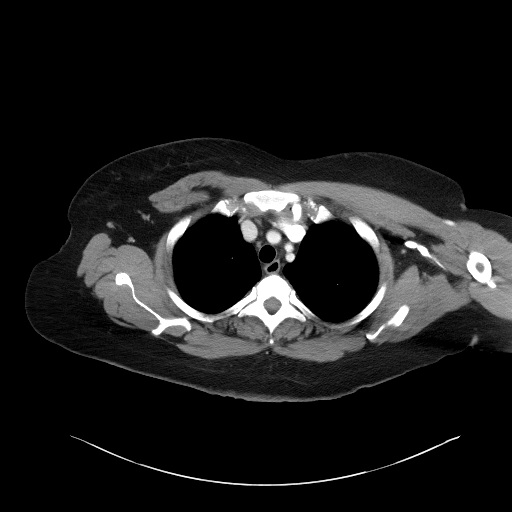
[im 114/127  bone]
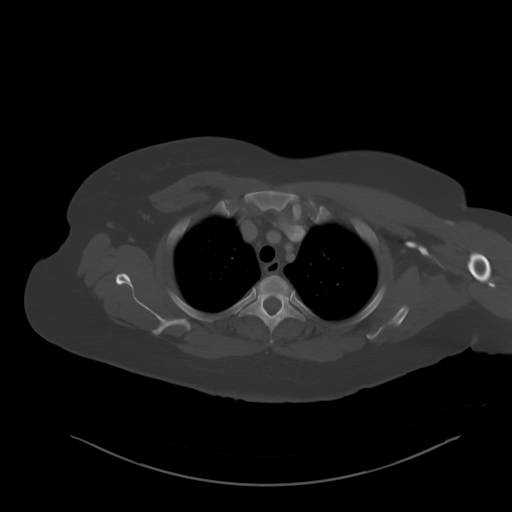

[Series 4: lung · axial · 0.64mm/px · z∈[-433,-385]mm · 2 of 155 slices shown]
[im 12/155  bone]
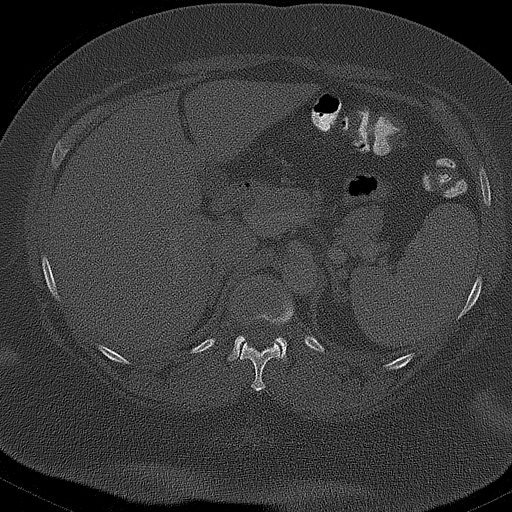
[im 36/155  bone]
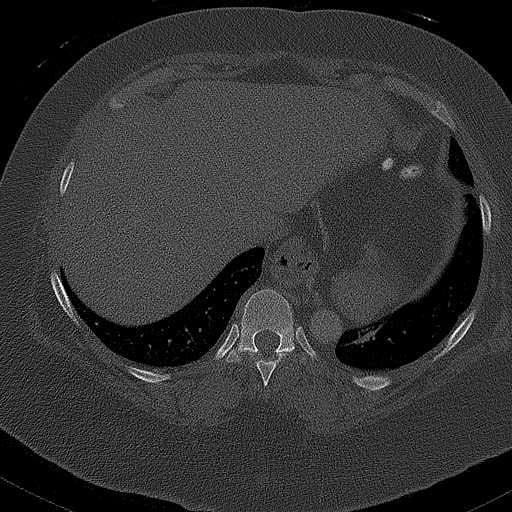

[Series 5: coronals · coronal · 0.77mm/px · 3 of 176 slices shown]
[im 36/176  mediastinal]
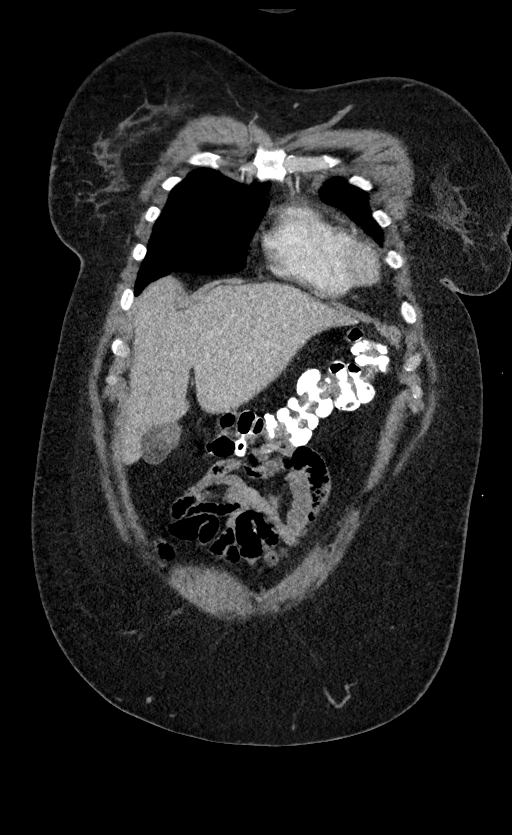
[im 71/176  mediastinal]
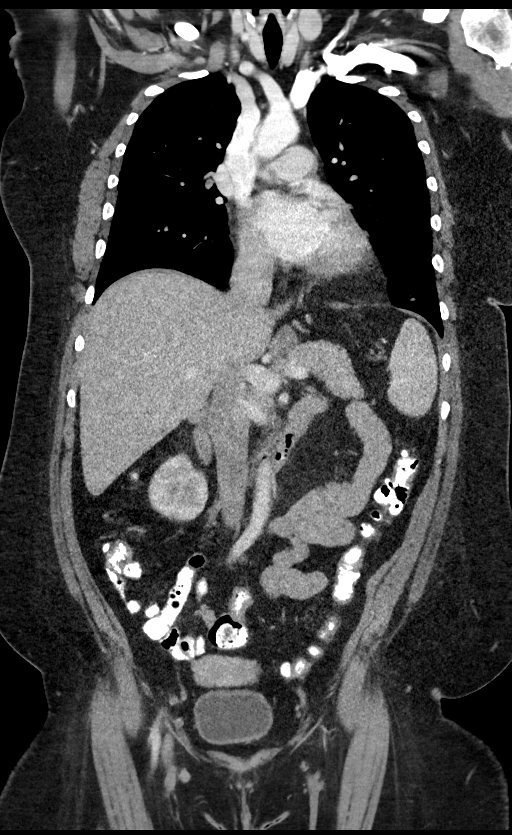
[im 106/176  mediastinal]
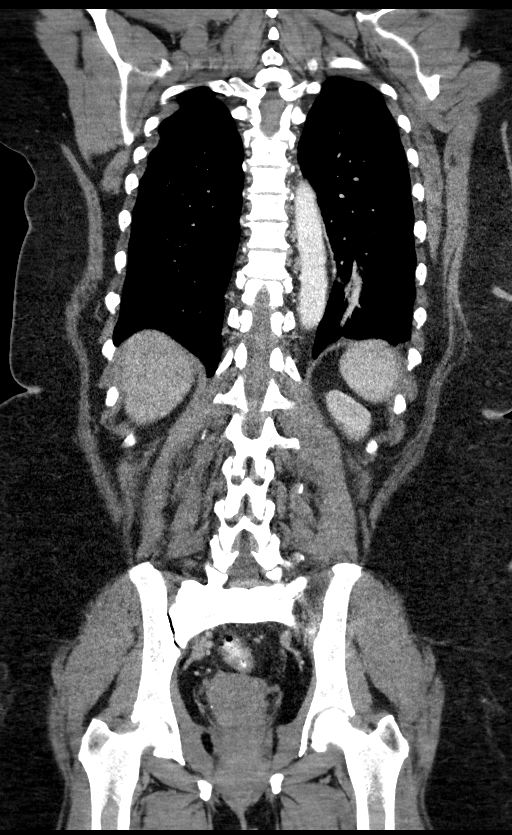

[14 of 36 positions shown; findings below may reference images not displayed]

FINDINGS: CT CHEST FINDINGS

Cardiovascular: There is no cardiomegaly or pericardial effusion.
The thoracic aorta is unremarkable. The origins of the great vessels
of the aortic arch appear patent. The central pulmonary arteries are
unremarkable.

Mediastinum/Nodes: There is no hilar or mediastinal adenopathy. The
esophagus and the thyroid gland are grossly unremarkable. No
mediastinal fluid collection.

Lungs/Pleura: Fall there is minimal left lung base atelectasis. No
focal consolidation, pleural effusion, pneumothorax. The central
airways are patent.

Musculoskeletal: No chest wall mass or suspicious bone lesions
identified.

CT ABDOMEN PELVIS FINDINGS

No intra-abdominal free air or free fluid.

Hepatobiliary: Several subcentimeter hepatic hypodense lesions are
too small to characterize. No intrahepatic biliary ductal
dilatation. The gallbladder is unremarkable.

Pancreas: Unremarkable. No pancreatic ductal dilatation or
surrounding inflammatory changes.

Spleen: Normal in size without focal abnormality.

Adrenals/Urinary Tract: The adrenal glands unremarkable. There is no
hydronephrosis on either side. There is symmetric enhancement and
excretion of contrast by both kidneys. There is a 1 cm left renal
inferior pole cyst. The visualized ureters and urinary bladder
appear unremarkable.

Stomach/Bowel: There is a large hiatal hernia containing the
majority of the stomach with organo-axial gastric volvulus similar
to prior CT. No evidence of gastric outlet obstruction. There is no
bowel obstruction or active inflammation. The appendix is normal.

Vascular/Lymphatic: The abdominal aorta and IVC unremarkable. No
portal venous gas. There is no adenopathy.

Reproductive: The uterus is anteverted and grossly unremarkable.
Bilateral tubal ligation clips noted.

Other: None

Musculoskeletal: Bilateral sacroiliitis. No acute osseous pathology.
IMPRESSION: 1. No acute intrathoracic, abdominal, or pelvic pathology.
2. Large hiatal hernia with gastric volvulus similar to prior CT. No
evidence of obstruction.

## 2022-01-05 DIAGNOSIS — I872 Venous insufficiency (chronic) (peripheral): Secondary | ICD-10-CM | POA: Insufficient documentation

## 2022-04-04 LAB — HM MAMMOGRAPHY

## 2023-08-15 ENCOUNTER — Ambulatory Visit
Admission: EM | Admit: 2023-08-15 | Discharge: 2023-08-15 | Disposition: A | Discharge status: Home / Self Care | Payer: 59

## 2023-08-15 ENCOUNTER — Encounter: Payer: Self-pay | Admitting: Internal Medicine

## 2023-08-15 ENCOUNTER — Encounter: Payer: Self-pay | Admitting: Emergency Medicine

## 2023-08-15 DIAGNOSIS — A084 Viral intestinal infection, unspecified: Secondary | ICD-10-CM

## 2023-08-15 DIAGNOSIS — R112 Nausea with vomiting, unspecified: Secondary | ICD-10-CM | POA: Diagnosis not present

## 2023-08-15 DIAGNOSIS — R197 Diarrhea, unspecified: Secondary | ICD-10-CM | POA: Diagnosis not present

## 2023-08-15 MED ORDER — ONDANSETRON 4 MG PO TBDP: 4.0000 mg | ORAL_TABLET | Freq: Three times a day (TID) | ORAL | 0 refills | Status: DC | PRN

## 2023-08-15 MED ORDER — ONDANSETRON 4 MG PO TBDP
4.0000 mg | ORAL_TABLET | Freq: Once | ORAL | Status: AC
  Administered 2023-08-15: 4 mg via ORAL

## 2023-08-15 NOTE — ED Triage Notes (Signed)
Patient c/o vomiting and diarrhea that started around 4 am this morning.  Denies any fever.  Some nausea.  Patient denies any OTC meds.  Patient has been able to sip water w/o difficulty.

## 2023-08-15 NOTE — Discharge Instructions (Signed)
Your evaluation suggests that your symptoms are most likely due to viral stomach illness (gastroenteritis/"stomach bug") which will improve on its own with rest and fluids in the next few days.   Take zofran to help with nausea every 8 hours as needed. You may use over the counter medicines for aches and pains such as tylenol as needed.  Start sipping on liquids (broth, water, gatorade, etc). If you are able to keep liquids down without vomiting for 1-2 hours, you may eat bland foods like jello, pudding, applesauce, bananas, rice, and white toast. Once you can tolerate blands, you may return to normal diet.   Pedialyte or gatorolyte may help to prevent/fix dehydration due to vomiting and diarrhea.  Please follow up with your primary care provider for further management. Return if you experience worsening or uncontrolled pain, inability to tolerate fluids by mouth, difficulty breathing, fevers 100.78F or greater, recurrent vomiting, or any other concerning symptoms.

## 2023-08-15 NOTE — ED Provider Notes (Signed)
[facility]    CSN: [id number] Arrival date & time: 08/15/23  1523      History   Chief Complaint Chief Complaint  Patient presents with   Emesis    HPI Christina Mcbride is a 52 y.o. female.   Christina Mcbride is a 52 y.o. female presenting for chief complaint of nausea, vomiting, and diarrhea that started several hours ago at around 4am this morning. She initially started with a few episodes of non-bilious/non-bloody emesis this morning that progressed to a few episodes of diarrhea this morning/afternoon. She remains nauseous although she has been able to keep down sips of liquid since last episode of emesis. Denies abdominal pain, dizziness, urinary symptoms, back pain, body aches, viral URI symptoms, weakness, and recent sick contacts with similar symptoms. No recent intake of foods outside of normal diet or water from unknown source.  She has not attempted use of any OTC medications to help with symptoms PTA.    Emesis   Past Medical History:  Diagnosis Date   Amenia 11/2018   Blood transfusion without reported diagnosis    COVID-19 virus infection - May 2021 04/15/2020   Migraine    Orthostatic hypotension    PTSD (post-traumatic stress disorder)    reports daughter was murdered    Tachycardia    Thyroid disease    Vitamin D deficiency     Patient Active Problem List   Diagnosis Date Noted   Paraesophageal hiatal hernia 07/16/2020   Obesity, Class III, BMI 40-49.9 (morbid obesity) (HCC) 07/16/2020   Occult blood positive stool 07/16/2020   Acute on chronic blood loss anemia 07/16/2020   Orthostatic hypotension 11/29/2018   Chronic daily headache 06/08/2014   Gastroesophageal reflux disease without esophagitis 06/08/2014   Iron deficiency anemia due to chronic blood loss 03/06/2014   Left shoulder pain 03/06/2014   Anemia 12/27/2013   Symptomatic anemia 12/27/2013   Tachycardia 12/27/2013   Bilateral leg edema 12/27/2013   Goiter  12/27/2013   Dizziness 12/27/2013   Generalized weakness 12/27/2013   Low iron stores 12/27/2013   UTI (urinary tract infection) 12/27/2013   PTSD (post-traumatic stress disorder)    Migraine     Past Surgical History:  Procedure Laterality Date   BIOPSY  10/17/2019   Procedure: BIOPSY;  Surgeon: Sherrilyn Rist, MD;  Location: [facility] ENDOSCOPY;  Service: Gastroenterology;;   COLONOSCOPY WITH PROPOFOL N/A 10/17/2019   Procedure: COLONOSCOPY WITH PROPOFOL;  Surgeon: Sherrilyn Rist, MD;  Location: [facility] ENDOSCOPY;  Service: Gastroenterology;  Laterality: N/A;   ESOPHAGOGASTRODUODENOSCOPY (EGD) WITH PROPOFOL N/A 10/17/2019   Procedure: ESOPHAGOGASTRODUODENOSCOPY (EGD) WITH PROPOFOL;  Surgeon: Sherrilyn Rist, MD;  Location: [facility] ENDOSCOPY;  Service: Gastroenterology;  Laterality: N/A;   TUBAL LIGATION      OB History   No obstetric history on file.      Home Medications    Prior to Admission medications   Medication Sig Start Date End Date Taking? Authorizing Provider  escitalopram (LEXAPRO) 10 MG tablet Take 10 mg by mouth at bedtime.   Yes [provider]  ferrous sulfate 325 (65 FE) MG tablet Take 1 tablet (325 mg total) by mouth daily with breakfast. 07/29/20  Yes Grayce Sessions, NP  folic acid (FOLVITE) 1 MG tablet Take 1 mg by mouth daily.   Yes [provider]  Multiple Vitamin (MULTIVITAMIN ADULT) TABS Take 2 tablets by mouth daily.   Yes [provider]  ondansetron (ZOFRAN-ODT) 4 MG  disintegrating tablet Take 1 tablet (4 mg total) by mouth every 8 (eight) hours as needed for nausea or vomiting. 08/15/23  Yes Carlisle Beers, FNP  acetaminophen (TYLENOL) 325 MG tablet Take 2 tablets (650 mg total) by mouth every 6 (six) hours as needed for mild pain (or Fever >/= 101). 07/21/20   Meuth, Lina Sar, PA-C  esomeprazole (NEXIUM) 20 MG capsule Take 1 capsule (20 mg total) by mouth daily at 12 noon. 07/28/20   Grayce Sessions, NP  oxyCODONE  (ROXICODONE) 5 MG/5ML solution Take 5 mLs (5 mg total) by mouth every 6 (six) hours as needed for severe pain. 07/21/20   Meuth, Lina Sar, PA-C    Family History Family History  Problem Relation Age of Onset   Hypertension Mother    Arthritis Mother    Hypertension Son    Cancer Maternal Aunt    Diabetes Maternal Grandmother     Social History Social History   Tobacco Use   Smoking status: Never   Smokeless tobacco: Never  Vaping Use   Vaping status: Never Used  Substance Use Topics   Alcohol use: No   Drug use: No     Allergies   Bee venom   Review of Systems Review of Systems  Gastrointestinal:  Positive for vomiting.  Per HPI   Physical Exam Triage Vital Signs ED Triage Vitals  Encounter Vitals Group     BP 08/15/23 1538 138/87     Systolic BP Percentile --      Diastolic BP Percentile --      Pulse Rate 08/15/23 1538 68     Resp 08/15/23 1538 16     Temp 08/15/23 1538 97.8 F (36.6 C)     Temp Source 08/15/23 1538 Oral     SpO2 08/15/23 1538 99 %     Weight 08/15/23 1540 286 lb (129.7 kg)     Height 08/15/23 1540 5\' 7"  (1.702 m)     Head Circumference --      Peak Flow --      Pain Score 08/15/23 1540 0     Pain Loc --      Pain Education --      Exclude from Growth Chart --    No data found.  Updated Vital Signs BP 138/87 (BP Location: Right Arm)   Pulse 68   Temp 97.8 F (36.6 C) (Oral)   Resp 16   Ht 5\' 7"  (1.702 m)   Wt 286 lb (129.7 kg)   LMP 05/04/2016   SpO2 99%   BMI 44.79 kg/m   Visual Acuity Right Eye Distance:   Left Eye Distance:   Bilateral Distance:    Right Eye Near:   Left Eye Near:    Bilateral Near:     Physical Exam Vitals and nursing note reviewed.  Constitutional:      Appearance: She is not ill-appearing or toxic-appearing.  HENT:     Head: Normocephalic and atraumatic.     Right Ear: Hearing and external ear normal.     Left Ear: Hearing and external ear normal.     Nose: Nose normal.      Mouth/Throat:     Lips: Pink.     Mouth: Mucous membranes are moist.     Pharynx: No posterior oropharyngeal erythema.  Eyes:     General: Lids are normal. Vision grossly intact. Gaze aligned appropriately.     Extraocular Movements: Extraocular movements intact.     Conjunctiva/sclera:  Conjunctivae normal.  Pulmonary:     Effort: Pulmonary effort is normal.  Abdominal:     General: Abdomen is flat. Bowel sounds are normal.     Palpations: Abdomen is soft.     Tenderness: There is no abdominal tenderness. There is no right CVA tenderness, left CVA tenderness or guarding.     Comments: No peritoneal signs to abdominal exam.  Musculoskeletal:     Cervical back: Neck supple.  Skin:    General: Skin is warm and dry.     Capillary Refill: Capillary refill takes less than 2 seconds.     Findings: No rash.  Neurological:     General: No focal deficit present.     Mental Status: She is alert and oriented to person, place, and time. Mental status is at baseline.     Cranial Nerves: No dysarthria or facial asymmetry.  Psychiatric:        Mood and Affect: Mood normal.        Speech: Speech normal.        Behavior: Behavior normal.        Thought Content: Thought content normal.        Judgment: Judgment normal.      UC Treatments / Results  Labs (all labs ordered are listed, but only abnormal results are displayed) Labs Reviewed - No data to display  EKG   Radiology No results found.  Procedures Procedures (including critical care time)  Medications Ordered in UC Medications  ondansetron (ZOFRAN-ODT) disintegrating tablet 4 mg (4 mg Oral Given 08/15/23 1556)    Initial Impression / Assessment and Plan / UC Course  I have reviewed the triage vital signs and the nursing notes.  Pertinent labs & imaging results that were available during my care of the patient were reviewed by me and considered in my medical decision making (see chart for details).   1. Nausea vomiting and  diarrhea, viral gastroenteritis Evaluation suggests viral gastrointestinal illness etiology.  Patient nontoxic appearing with hemodynamically stable vital signs, abdominal exam without peritoneal signs/focal tenderness. Will manage this with antiemetic (Zofran) as needed, OTC medicines as needed for discomfort/pain, increased fluids, and rest. Zofran 4mg  ODT given in clinic.  Liquid/bland diet initially, then increase diet as tolerated.   Counseled patient on potential for adverse effects with medications prescribed/recommended today, strict ER and return-to-clinic precautions discussed, patient verbalized understanding.    Final Clinical Impressions(s) / UC Diagnoses   Final diagnoses:  Nausea vomiting and diarrhea  Viral gastroenteritis     Discharge Instructions      Your evaluation suggests that your symptoms are most likely due to viral stomach illness (gastroenteritis/"stomach bug") which will improve on its own with rest and fluids in the next few days.   Take zofran to help with nausea every 8 hours as needed. You may use over the counter medicines for aches and pains such as tylenol as needed.  Start sipping on liquids (broth, water, gatorade, etc). If you are able to keep liquids down without vomiting for 1-2 hours, you may eat bland foods like jello, pudding, applesauce, bananas, rice, and white toast. Once you can tolerate blands, you may return to normal diet.   Pedialyte or gatorolyte may help to prevent/fix dehydration due to vomiting and diarrhea.  Please follow up with your primary care provider for further management. Return if you experience worsening or uncontrolled pain, inability to tolerate fluids by mouth, difficulty breathing, fevers 100.76F or greater, recurrent vomiting, or any  other concerning symptoms.     ED Prescriptions     Medication Sig Dispense Auth. Provider   ondansetron (ZOFRAN-ODT) 4 MG disintegrating tablet Take 1 tablet (4 mg total) by  mouth every 8 (eight) hours as needed for nausea or vomiting. 20 tablet Carlisle Beers, FNP      PDMP not reviewed this encounter.   Carlisle Beers, [place] 08/15/23 1610

## 2023-08-20 ENCOUNTER — Ambulatory Visit: Payer: 59 | Admitting: Family Medicine

## 2023-08-22 ENCOUNTER — Encounter: Payer: Self-pay | Admitting: Internal Medicine

## 2023-08-29 ENCOUNTER — Ambulatory Visit (INDEPENDENT_AMBULATORY_CARE_PROVIDER_SITE_OTHER): Payer: 59 | Admitting: Family Medicine

## 2023-08-29 ENCOUNTER — Other Ambulatory Visit: Payer: Self-pay | Admitting: Family Medicine

## 2023-08-29 ENCOUNTER — Encounter: Payer: Self-pay | Admitting: Family Medicine

## 2023-08-29 VITALS — BP 122/79 | HR 55 | Temp 97.9°F | Resp 18 | Ht 67.0 in | Wt 284.4 lb

## 2023-08-29 DIAGNOSIS — E611 Iron deficiency: Secondary | ICD-10-CM

## 2023-08-29 DIAGNOSIS — Z6841 Body Mass Index (BMI) 40.0 and over, adult: Secondary | ICD-10-CM

## 2023-08-29 DIAGNOSIS — R600 Localized edema: Secondary | ICD-10-CM | POA: Diagnosis not present

## 2023-08-29 DIAGNOSIS — Z7689 Persons encountering health services in other specified circumstances: Secondary | ICD-10-CM

## 2023-08-29 DIAGNOSIS — R7302 Impaired glucose tolerance (oral): Secondary | ICD-10-CM

## 2023-08-29 DIAGNOSIS — F4310 Post-traumatic stress disorder, unspecified: Secondary | ICD-10-CM | POA: Diagnosis not present

## 2023-08-29 MED ORDER — FERROUS SULFATE 325 (65 FE) MG PO TABS: 325.0000 mg | ORAL_TABLET | Freq: Two times a day (BID) | ORAL | 1 refills | Status: AC

## 2023-08-29 MED ORDER — ESCITALOPRAM OXALATE 10 MG PO TABS: 10.0000 mg | ORAL_TABLET | Freq: Every day | ORAL | 1 refills | Status: DC

## 2023-08-29 MED ORDER — OZEMPIC (0.25 OR 0.5 MG/DOSE) 2 MG/3ML ~~LOC~~ SOPN: 0.5000 mg | PEN_INJECTOR | SUBCUTANEOUS | 1 refills | Status: DC

## 2023-08-29 NOTE — Progress Notes (Unsigned)
New Patient Office Visit  Subjective    Patient ID: Christina Mcbride, female    DOB: 1971/10/10  Age: 52 y.o. MRN: [id number]  CC:  Chief Complaint  Patient presents with   Establish Care    Patient is here to establish care with new PCP, she states she would like to discuss swelling in both legs and feet that she has had previously for quite some time. She also would like to discuss weight management    HPI Christina Mcbride [place] presents to establish care. Pt is new to me. She use to go to [facility].  Weight management Pt reports a hx of prediabetes. She reports she use to be on Ozempic for weight management and glucose control. She was on this for a few months and stopped taking this due to her previous provider leaving the practice. She would like to get her Ozempic refilled.   Chronic leg edema Pt has hx of lower extremity edema. She has seen a vein specialist in the past and had ultrasounds done. This was a year ago. She says she never seen anyone for this due to being told it was from venous insufficiency. She reports the leg swelling has worsened.  Mood disorder She has hx of PTSD and taking Lexapro 10mg . Needs this refilled. She also would like a counselor and mental health provider. Needs referral. Flowsheet Row Office Visit from 08/29/2023 in [facility]  PHQ-9 Total Score 20          08/29/2023    4:14 PM 07/28/2020    3:00 PM 04/01/2019   10:55 AM 05/29/2018    2:20 PM  GAD 7 : Generalized Anxiety Score  Nervous, Anxious, on Edge 1 2 3 3   Control/stop worrying 2 3 3 3   Worry too much - different things 2 2 3 3   Trouble relaxing 2 3 3 3   Restless 1 1 2 3   Easily annoyed or irritable 2 1 2 2   Afraid - awful might happen 0 0 3 3  Total GAD 7 Score 10 12 19 20   Anxiety Difficulty Somewhat difficult        Iron Deficiency She has hx of iron deficiency anemia. She is taking iron supplements BID. Needs this refilled. She  has had hiatal hernia repair which has helped her iron levels. She is up to date with her colonoscopy.  Outpatient Encounter Medications as of 08/29/2023  Medication Sig   acetaminophen (TYLENOL) 325 MG tablet Take 2 tablets (650 mg total) by mouth every 6 (six) hours as needed for mild pain (or Fever >/= 101).   escitalopram (LEXAPRO) 10 MG tablet Take 10 mg by mouth at bedtime.   esomeprazole (NEXIUM) 20 MG capsule Take 1 capsule (20 mg total) by mouth daily at 12 noon.   ferrous sulfate 325 (65 FE) MG tablet Take 1 tablet (325 mg total) by mouth daily with breakfast.   folic acid (FOLVITE) 1 MG tablet Take 1 mg by mouth daily.   Multiple Vitamin (MULTIVITAMIN ADULT) TABS Take 2 tablets by mouth daily.   ondansetron (ZOFRAN-ODT) 4 MG disintegrating tablet Take 1 tablet (4 mg total) by mouth every 8 (eight) hours as needed for nausea or vomiting.   OZEMPIC, 0.25 OR 0.5 MG/DOSE, 2 MG/3ML SOPN Inject 0.5 mg into the skin once a week. (Patient not taking: Reported on 08/29/2023)   [DISCONTINUED] oxyCODONE (ROXICODONE) 5 MG/5ML solution Take 5 mLs (5 mg total) by mouth  every 6 (six) hours as needed for severe pain.   No facility-administered encounter medications on file as of 08/29/2023.    Past Medical History:  Diagnosis Date   Amenia 11/2018   Blood transfusion without reported diagnosis    COVID-19 virus infection - May 2021 04/15/2020   Migraine    Orthostatic hypotension    PTSD (post-traumatic stress disorder)    reports daughter was murdered    Tachycardia    Thyroid disease    Vitamin D deficiency     Past Surgical History:  Procedure Laterality Date   BIOPSY  10/17/2019   Procedure: BIOPSY;  Surgeon: Sherrilyn Rist, MD;  Location: [facility] ENDOSCOPY;  Service: Gastroenterology;;   COLONOSCOPY WITH PROPOFOL N/A 10/17/2019   Procedure: COLONOSCOPY WITH PROPOFOL;  Surgeon: Sherrilyn Rist, MD;  Location: [facility] ENDOSCOPY;  Service: Gastroenterology;  Laterality: N/A;    ESOPHAGOGASTRODUODENOSCOPY (EGD) WITH PROPOFOL N/A 10/17/2019   Procedure: ESOPHAGOGASTRODUODENOSCOPY (EGD) WITH PROPOFOL;  Surgeon: Sherrilyn Rist, MD;  Location: [facility] ENDOSCOPY;  Service: Gastroenterology;  Laterality: N/A;   TUBAL LIGATION      Family History  Problem Relation Age of Onset   Hypertension Mother    Arthritis Mother    Hypertension Son    Cancer Maternal Aunt    Diabetes Maternal Grandmother     Social History   Socioeconomic History   Marital status: Single    Spouse name: Not on file   Number of children: Not on file   Years of education: Not on file   Highest education level: Not on file  Occupational History   Not on file  Tobacco Use   Smoking status: Never    Passive exposure: Never   Smokeless tobacco: Never  Vaping Use   Vaping status: Never Used  Substance and Sexual Activity   Alcohol use: No   Drug use: No   Sexual activity: Not Currently    Birth control/protection: Surgical  Other Topics Concern   Not on file  Social History Narrative   Pt relocated to [place] from [place] around 2012.      Lives in [place]; with significant other. 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    Social Determinants of Health   Financial Resource Strain: Not on file  Food Insecurity: Not on file  Transportation Needs: Not on file  Physical Activity: Not on file  Stress: Not on file  Social Connections: Not on file  Intimate Partner Violence: Not on file    Review of Systems  Constitutional:        Weight gain  Cardiovascular:  Positive for leg swelling.  Psychiatric/Behavioral:  Positive for depression. The patient is nervous/anxious.   All other systems reviewed and are negative.      Objective    BP 122/79   Pulse (!) 55   Temp 97.9 F (36.6 C) (Oral)   Resp 18   Ht 5\' 7"  (1.702 m)   Wt 284 lb 6.4 oz (129 kg)   LMP 05/04/2016   SpO2 97%   BMI 44.54 kg/m   Physical Exam Vitals and nursing note reviewed.   Constitutional:      Appearance: Normal appearance. She is obese.  HENT:     Head: Normocephalic and atraumatic.     Right Ear: External ear normal.     Left Ear: External ear normal.     Nose: Nose normal.     Mouth/Throat:     Mouth: Mucous  membranes are moist.     Pharynx: Oropharynx is clear.  Eyes:     Conjunctiva/sclera: Conjunctivae normal.     Pupils: Pupils are equal, round, and reactive to light.  Cardiovascular:     Rate and Rhythm: Bradycardia present.  Pulmonary:     Effort: Pulmonary effort is normal.  Musculoskeletal:     Comments: Bilateral chronic lymphedema  Skin:    General: Skin is warm.     Capillary Refill: Capillary refill takes less than 2 seconds.  Neurological:     General: No focal deficit present.     Mental Status: She is alert and oriented to person, place, and time. Mental status is at baseline.  Psychiatric:        Mood and Affect: Mood normal.        Behavior: Behavior normal.        Thought Content: Thought content normal.        Judgment: Judgment normal.       Assessment & Plan:   Problem List Items Addressed This Visit   None Encounter to establish care with new doctor  BMI 40.0-44.9, adult (HCC) -     Hemoglobin A1c -     Ozempic (0.25 or 0.5 MG/DOSE); Inject 0.5 mg into the skin once a week.  Dispense: 9 mL; Refill: 1  Impaired glucose tolerance -     Hemoglobin A1c -     Ozempic (0.25 or 0.5 MG/DOSE); Inject 0.5 mg into the skin once a week.  Dispense: 9 mL; Refill: 1  Peripheral edema -     Pro b natriuretic peptide (BNP)  PTSD (post-traumatic stress disorder) -     Escitalopram Oxalate; Take 1 tablet (10 mg total) by mouth at bedtime.  Dispense: 90 tablet; Refill: 1 -     Ambulatory referral to Behavioral Health  Iron deficiency -     Ferrous Sulfate; Take 1 tablet (325 mg total) by mouth 2 (two) times daily with a meal.  Dispense: 180 tablet; Refill: 1 -     CBC with Differential/Platelet   Due to BMI, will refill  Ozempic 0.5mg  weekly. Will titrate accordingly. Checking A1c today for baseline. Pt's edema appears to be chronic and resembles more lymphedema. I advised pt that I'd like to check BNP to rule out CHF as cause. If negative, pt may benefit from OT lymphedema clinic.  Refilled Lexapro 10mg  daily for PTSD. Referral to [facility] was made for further evaluation. Refilled Iron supplements and rechecking iron levels today.  No follow-ups on file.   Suzan Slick, MD

## 2023-08-30 LAB — CBC WITH DIFFERENTIAL/PLATELET
Basophils Absolute: 0.1 10*3/uL (ref 0.0–0.2)
Basos: 1 %
EOS (ABSOLUTE): 0.1 10*3/uL (ref 0.0–0.4)
Eos: 1 %
Hematocrit: 43.3 % (ref 34.0–46.6)
Hemoglobin: 13 g/dL (ref 11.1–15.9)
Immature Grans (Abs): 0.1 10*3/uL (ref 0.0–0.1)
Immature Granulocytes: 1 %
Lymphocytes Absolute: 1.8 10*3/uL (ref 0.7–3.1)
Lymphs: 23 %
MCH: 22.5 pg — ABNORMAL LOW (ref 26.6–33.0)
MCHC: 30 g/dL — ABNORMAL LOW (ref 31.5–35.7)
MCV: 75 fL — ABNORMAL LOW (ref 79–97)
Monocytes Absolute: 0.6 10*3/uL (ref 0.1–0.9)
Monocytes: 7 %
Neutrophils Absolute: 5.2 10*3/uL (ref 1.4–7.0)
Neutrophils: 67 %
Platelets: 449 10*3/uL (ref 150–450)
RBC: 5.78 x10E6/uL — ABNORMAL HIGH (ref 3.77–5.28)
RDW: 18.3 % — ABNORMAL HIGH (ref 11.7–15.4)
WBC: 7.8 10*3/uL (ref 3.4–10.8)

## 2023-08-30 LAB — PRO B NATRIURETIC PEPTIDE: NT-Pro BNP: 219 pg/mL (ref 0–249)

## 2023-08-30 LAB — HEMOGLOBIN A1C
Est. average glucose Bld gHb Est-mCnc: 123 mg/dL
Hgb A1c MFr Bld: 5.9 % — ABNORMAL HIGH (ref 4.8–5.6)

## 2023-09-20 ENCOUNTER — Encounter: Payer: Self-pay | Admitting: Internal Medicine

## 2023-09-26 ENCOUNTER — Encounter: Payer: Self-pay | Admitting: Emergency Medicine

## 2023-09-26 ENCOUNTER — Ambulatory Visit
Admission: EM | Admit: 2023-09-26 | Discharge: 2023-09-26 | Disposition: A | Discharge status: Home / Self Care | Payer: 59 | Attending: Family Medicine | Admitting: Family Medicine

## 2023-09-26 ENCOUNTER — Ambulatory Visit: Payer: 59

## 2023-09-26 DIAGNOSIS — M79671 Pain in right foot: Secondary | ICD-10-CM

## 2023-09-26 DIAGNOSIS — M2141 Flat foot [pes planus] (acquired), right foot: Secondary | ICD-10-CM

## 2023-09-26 DIAGNOSIS — M2142 Flat foot [pes planus] (acquired), left foot: Secondary | ICD-10-CM | POA: Diagnosis not present

## 2023-09-26 MED ORDER — CELECOXIB 200 MG PO CAPS: 200.0000 mg | ORAL_CAPSULE | Freq: Every day | ORAL | 0 refills | Status: DC

## 2023-09-26 NOTE — ED Triage Notes (Signed)
Patient c/o right sided foot pain x 1 month, no apparent injury.  Patient is on her feet all day due to her work.  Patient has tried different shoes, tried to place a callus protector on the area, did not stick.  Patient denies any OTC pain meds.

## 2023-09-26 NOTE — ED Provider Notes (Signed)
[facility]    CSN: [id number] Arrival date & time: 09/26/23  1610      History   Chief Complaint Chief Complaint  Patient presents with   Foot Pain    HPI Christina Mcbride is a 52 y.o. female.   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  She states that for the last month or more she has had right foot pain.  The outside of her right foot hurts all day at work.  She has tried different shoes.  She has tried pads for the shoes.  She continues to complain of foot pain.  Denies foot problems previously.  Past Medical History:  Diagnosis Date   Amenia 11/2018   Blood transfusion without reported diagnosis    COVID-19 virus infection - May 2021 04/15/2020   Migraine    Orthostatic hypotension    PTSD (post-traumatic stress disorder)    reports daughter was murdered    Tachycardia    Thyroid disease    Vitamin D deficiency     Patient Active Problem List   Diagnosis Date Noted   Venous insufficiency (chronic) (peripheral) 01/05/2022   Paraesophageal hiatal hernia 07/16/2020   Obesity, Class III, BMI 40-49.9 (morbid obesity) (HCC) 07/16/2020   Occult blood positive stool 07/16/2020   Acute on chronic blood loss anemia 07/16/2020   Orthostatic hypotension 11/29/2018   Chronic daily headache 06/08/2014   Gastroesophageal reflux disease without esophagitis 06/08/2014   Iron deficiency anemia due to chronic blood loss 03/06/2014   Left shoulder pain 03/06/2014   Anemia 12/27/2013   Symptomatic anemia 12/27/2013   Tachycardia 12/27/2013   Bilateral leg edema 12/27/2013   Goiter 12/27/2013   Dizziness 12/27/2013   Generalized weakness 12/27/2013   Low iron stores 12/27/2013   UTI (urinary tract infection) 12/27/2013   PTSD (post-traumatic stress disorder)    Migraine     Past Surgical History:  Procedure Laterality Date   BIOPSY  10/17/2019   Procedure: BIOPSY;  Surgeon: Sherrilyn Rist,  MD;  Location: [facility] ENDOSCOPY;  Service: Gastroenterology;;   COLONOSCOPY WITH PROPOFOL N/A 10/17/2019   Procedure: COLONOSCOPY WITH PROPOFOL;  Surgeon: Sherrilyn Rist, MD;  Location: [facility] ENDOSCOPY;  Service: Gastroenterology;  Laterality: N/A;   ESOPHAGOGASTRODUODENOSCOPY (EGD) WITH PROPOFOL N/A 10/17/2019   Procedure: ESOPHAGOGASTRODUODENOSCOPY (EGD) WITH PROPOFOL;  Surgeon: Sherrilyn Rist, MD;  Location: [facility] ENDOSCOPY;  Service: Gastroenterology;  Laterality: N/A;   TUBAL LIGATION      OB History   No obstetric history on file.      Home Medications    Prior to Admission medications   Medication Sig Start Date End Date Taking? Authorizing Provider  acetaminophen (TYLENOL) 325 MG tablet Take 2 tablets (650 mg total) by mouth every 6 (six) hours as needed for mild pain (or Fever >/= 101). 07/21/20  Yes Meuth, Brooke A, PA-C  celecoxib (CELEBREX) 200 MG capsule Take 1 capsule (200 mg total) by mouth daily. 09/26/23  Yes Eustace Moore, MD  escitalopram (LEXAPRO) 10 MG tablet Take 1 tablet (10 mg total) by mouth at bedtime. 08/29/23  Yes Rucker, Magdalen Spatz, MD  esomeprazole (NEXIUM) 20 MG capsule Take 1 capsule (20 mg total) by mouth daily at 12 noon. 07/28/20  Yes Grayce Sessions, NP  ferrous sulfate 325 (65 FE) MG tablet Take 1 tablet (325 mg total) by mouth 2 (two) times daily with a meal. 08/29/23  Yes Rucker, Magdalen Spatz, MD  folic acid (FOLVITE) 1 MG tablet Take 1 mg by mouth daily.   Yes [provider]  Multiple Vitamin (MULTIVITAMIN ADULT) TABS Take 2 tablets by mouth daily.   Yes [provider]  OZEMPIC, 0.25 OR 0.5 MG/DOSE, 2 MG/3ML SOPN Inject 0.5 mg into the skin once a week. 08/29/23  Yes Suzan Slick, MD    Family History Family History  Problem Relation Age of Onset   Hypertension Mother    Arthritis Mother    Hypertension Son    Cancer Maternal Aunt    Diabetes Maternal Grandmother     Social History Social History   Tobacco Use    Smoking status: Never    Passive exposure: Never   Smokeless tobacco: Never  Vaping Use   Vaping status: Never Used  Substance Use Topics   Alcohol use: No   Drug use: No     Allergies   Bee venom   Review of Systems Review of Systems See HPI  Physical Exam Triage Vital Signs ED Triage Vitals  Encounter Vitals Group     BP 09/26/23 1623 (!) 160/85     Systolic BP Percentile --      Diastolic BP Percentile --      Pulse Rate 09/26/23 1623 87     Resp 09/26/23 1623 18     Temp 09/26/23 1623 97.9 F (36.6 C)     Temp Source 09/26/23 1623 Oral     SpO2 09/26/23 1623 96 %     Weight 09/26/23 1625 280 lb (127 kg)     Height 09/26/23 1625 5\' 7"  (1.702 m)     Head Circumference --      Peak Flow --      Pain Score 09/26/23 1624 7     Pain Loc --      Pain Education --      Exclude from Growth Chart --    No data found.  Updated Vital Signs BP (!) 160/85 (BP Location: Right Arm)   Pulse 87   Temp 97.9 F (36.6 C) (Oral)   Resp 18   Ht 5\' 7"  (1.702 m)   Wt 127 kg   LMP 05/04/2016   SpO2 96%   BMI 43.85 kg/m       Physical Exam Constitutional:      General: She is not in acute distress.    Appearance: She is well-developed. She is obese.  HENT:     Head: Normocephalic and atraumatic.  Eyes:     Conjunctiva/sclera: Conjunctivae normal.     Pupils: Pupils are equal, round, and reactive to light.  Cardiovascular:     Rate and Rhythm: Normal rate.  Pulmonary:     Effort: Pulmonary effort is normal. No respiratory distress.  Abdominal:     General: There is no distension.     Palpations: Abdomen is soft.  Musculoskeletal:        General: Normal range of motion.     Cervical back: Normal range of motion.     Comments: Pes planus bilaterally.  Foot has valgus positioning.  There is a callus under the distal fifth metatarsal.  Fifth metatarsal in its entirety is tender, mildly.  No swelling  Skin:    General: Skin is warm and dry.  Neurological:     Mental  Status: She is alert.      UC Treatments / Results  Labs (all labs ordered are listed, but only  abnormal results are displayed) Labs Reviewed - No data to display  EKG   Radiology No results found.  Procedures Procedures (including critical care time)  Medications Ordered in UC Medications - No data to display  Initial Impression / Assessment and Plan / UC Course  I have reviewed the triage vital signs and the nursing notes.  Pertinent labs & imaging results that were available during my care of the patient were reviewed by me and considered in my medical decision making (see chart for details).     Patient was discharged prior to x-rays being formally read.  To my review there is no acute findings.  Concern for stress fracture given patient's metatarsal pain, not identified. Final Clinical Impressions(s) / UC Diagnoses   Final diagnoses:  Foot pain, right  Pes planus of both feet     Discharge Instructions      Take Celebrex once a day Limit walking movements painful Follow-up with Dr. Ralene Cork     ED Prescriptions     Medication Sig Dispense Auth. Provider   celecoxib (CELEBREX) 200 MG capsule Take 1 capsule (200 mg total) by mouth daily. 30 capsule Eustace Moore, MD      PDMP not reviewed this encounter.   Eustace Moore, MD 09/26/23 [phone]

## 2023-09-26 NOTE — Discharge Instructions (Signed)
Take Celebrex once a day Limit walking movements painful Follow-up with Dr. Ralene Cork

## 2023-10-01 ENCOUNTER — Encounter: Payer: Self-pay | Admitting: Internal Medicine

## 2023-10-11 ENCOUNTER — Ambulatory Visit: Payer: 59 | Admitting: Podiatry

## 2023-10-11 DIAGNOSIS — Z91199 Patient's noncompliance with other medical treatment and regimen due to unspecified reason: Secondary | ICD-10-CM

## 2023-10-11 NOTE — Progress Notes (Signed)
No show

## 2023-10-24 ENCOUNTER — Encounter: Payer: Self-pay | Admitting: Professional

## 2023-10-24 ENCOUNTER — Ambulatory Visit: Payer: 59 | Admitting: Professional

## 2023-10-24 DIAGNOSIS — F331 Major depressive disorder, recurrent, moderate: Secondary | ICD-10-CM

## 2023-10-24 DIAGNOSIS — Z62810 Personal history of physical and sexual abuse in childhood: Secondary | ICD-10-CM

## 2023-10-24 DIAGNOSIS — F4310 Post-traumatic stress disorder, unspecified: Secondary | ICD-10-CM

## 2023-10-24 NOTE — Progress Notes (Signed)
Teofilo Pod, [facility]
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
## 2023-10-30 ENCOUNTER — Ambulatory Visit: Payer: 59 | Admitting: Professional

## 2023-11-06 ENCOUNTER — Ambulatory Visit: Payer: 59 | Admitting: Professional

## 2023-11-08 ENCOUNTER — Ambulatory Visit: Payer: 59 | Admitting: Professional

## 2023-11-08 ENCOUNTER — Encounter: Payer: Self-pay | Admitting: Professional

## 2023-11-08 DIAGNOSIS — F331 Major depressive disorder, recurrent, moderate: Secondary | ICD-10-CM

## 2023-11-08 DIAGNOSIS — F4310 Post-traumatic stress disorder, unspecified: Secondary | ICD-10-CM

## 2023-11-08 DIAGNOSIS — Z62810 Personal history of physical and sexual abuse in childhood: Secondary | ICD-10-CM
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
## 2023-11-08 NOTE — Progress Notes (Signed)
This encounter was created in error - please disregard. ? ? ? ? ? ? ? ? ? ? ? ? ? ? ?Oma Marzan, LCMHC ?

## 2023-11-16 ENCOUNTER — Encounter: Payer: 59 | Admitting: Professional

## 2023-11-16 ENCOUNTER — Encounter: Payer: Self-pay | Admitting: Family Medicine

## 2023-11-19 ENCOUNTER — Ambulatory Visit: Payer: 59 | Admitting: Professional

## 2023-11-19 ENCOUNTER — Encounter: Payer: Self-pay | Admitting: Professional

## 2023-11-19 NOTE — Progress Notes (Unsigned)
° ° ° ° ° ° ° ° ° ° ° ° ° ° °  Araya Roel, LCMHC °

## 2023-11-22 ENCOUNTER — Other Ambulatory Visit: Payer: Self-pay

## 2023-11-22 ENCOUNTER — Ambulatory Visit
Admission: EM | Admit: 2023-11-22 | Discharge: 2023-11-22 | Disposition: A | Discharge status: Home / Self Care | Payer: 59 | Attending: Family Medicine | Admitting: Family Medicine

## 2023-11-22 DIAGNOSIS — J069 Acute upper respiratory infection, unspecified: Secondary | ICD-10-CM

## 2023-11-22 LAB — POCT INFLUENZA A/B
Influenza A, POC: NEGATIVE
Influenza B, POC: NEGATIVE

## 2023-11-22 LAB — POC SARS CORONAVIRUS 2 AG -  ED: SARS Coronavirus 2 Ag: NEGATIVE

## 2023-11-22 NOTE — ED Triage Notes (Signed)
Sick since Tuesday with sore throat, rhinorrhea, losing voice. Reports low grade fever. Has taken nyquil, dayquil, chloraseptic, using vick's vaporub.

## 2023-11-22 NOTE — ED Provider Notes (Signed)
[facility]    CSN: [id number] Arrival date & time: 11/22/23  1019      History   Chief Complaint No chief complaint on file.   HPI Christina Mcbride is a 52 y.o. female.   HPI  Supposed to COVID at work Now has cough cold sore throat postnasal drip Harsh voice for the last 3 days Unable to go to work today needs note Low-grade fever, headache and body ache  Past Medical History:  Diagnosis Date   Amenia 11/2018   Blood transfusion without reported diagnosis    COVID-19 virus infection - May 2021 04/15/2020   Migraine    Orthostatic hypotension    PTSD (post-traumatic stress disorder)    reports daughter was murdered    Tachycardia    Thyroid disease    Vitamin D deficiency     Patient Active Problem List   Diagnosis Date Noted   Venous insufficiency (chronic) (peripheral) 01/05/2022   Paraesophageal hiatal hernia 07/16/2020   Obesity, Class III, BMI 40-49.9 (morbid obesity) (HCC) 07/16/2020   Occult blood positive stool 07/16/2020   Acute on chronic blood loss anemia 07/16/2020   Orthostatic hypotension 11/29/2018   Chronic daily headache 06/08/2014   Gastroesophageal reflux disease without esophagitis 06/08/2014   Iron deficiency anemia due to chronic blood loss 03/06/2014   Left shoulder pain 03/06/2014   Anemia 12/27/2013   Symptomatic anemia 12/27/2013   Tachycardia 12/27/2013   Bilateral leg edema 12/27/2013   Goiter 12/27/2013   Dizziness 12/27/2013   Generalized weakness 12/27/2013   Low iron stores 12/27/2013   UTI (urinary tract infection) 12/27/2013   PTSD (post-traumatic stress disorder)    Migraine     Past Surgical History:  Procedure Laterality Date   BIOPSY  10/17/2019   Procedure: BIOPSY;  Surgeon: Sherrilyn Rist, MD;  Location: [facility] ENDOSCOPY;  Service: Gastroenterology;;   COLONOSCOPY WITH PROPOFOL N/A 10/17/2019   Procedure: COLONOSCOPY WITH PROPOFOL;  Surgeon: Sherrilyn Rist, MD;  Location: [facility] ENDOSCOPY;   Service: Gastroenterology;  Laterality: N/A;   ESOPHAGOGASTRODUODENOSCOPY (EGD) WITH PROPOFOL N/A 10/17/2019   Procedure: ESOPHAGOGASTRODUODENOSCOPY (EGD) WITH PROPOFOL;  Surgeon: Sherrilyn Rist, MD;  Location: [facility] ENDOSCOPY;  Service: Gastroenterology;  Laterality: N/A;   TUBAL LIGATION      OB History   No obstetric history on file.      Home Medications    Prior to Admission medications   Medication Sig Start Date End Date Taking? Authorizing Provider  atorvastatin (LIPITOR) 20 MG tablet Take 20 mg by mouth daily.   Yes [provider]  escitalopram (LEXAPRO) 10 MG tablet Take 1 tablet (10 mg total) by mouth at bedtime. 08/29/23   Suzan Slick, MD  ferrous sulfate 325 (65 FE) MG tablet Take 1 tablet (325 mg total) by mouth 2 (two) times daily with a meal. 08/29/23   Rucker, Magdalen Spatz, MD  Multiple Vitamin (MULTIVITAMIN ADULT) TABS Take 2 tablets by mouth daily.    [provider]  OZEMPIC, 0.25 OR 0.5 MG/DOSE, 2 MG/3ML SOPN Inject 0.5 mg into the skin once a week. 08/29/23   Suzan Slick, MD    Family History Family History  Problem Relation Age of Onset   Hypertension Mother    Arthritis Mother    Hypertension Son    Cancer Maternal Aunt    Diabetes Maternal Grandmother     Social History Social History   Tobacco Use   Smoking status: Never  Passive exposure: Never   Smokeless tobacco: Never  Vaping Use   Vaping status: Never Used  Substance Use Topics   Alcohol use: No   Drug use: No     Allergies   Bee venom   Review of Systems Review of Systems See HPI  Physical Exam Triage Vital Signs ED Triage Vitals  Encounter Vitals Group     BP 11/22/23 1109 (!) 153/87     Systolic BP Percentile --      Diastolic BP Percentile --      Pulse Rate 11/22/23 1109 84     Resp --      Temp 11/22/23 1109 97.9 F (36.6 C)     Temp Source 11/22/23 1109 Oral     SpO2 11/22/23 1109 98 %     Weight --      Height --      Head  Circumference --      Peak Flow --      Pain Score 11/22/23 1113 7     Pain Loc --      Pain Education --      Exclude from Growth Chart --    No data found.  Updated Vital Signs BP (!) 153/87   Pulse 84   Temp 97.9 F (36.6 C) (Oral)   LMP 05/04/2016   SpO2 98%       Physical Exam Constitutional:      General: She is not in acute distress.    Appearance: She is well-developed.  HENT:     Head: Normocephalic and atraumatic.     Right Ear: Ear canal normal.     Left Ear: Ear canal normal.     Ears:     Comments: Both TMs are slightly active    Nose: Congestion and rhinorrhea present.     Mouth/Throat:     Pharynx: Posterior oropharyngeal erythema present.     Comments: Clear postnasal drip Eyes:     Conjunctiva/sclera: Conjunctivae normal.     Pupils: Pupils are equal, round, and reactive to light.  Cardiovascular:     Rate and Rhythm: Normal rate and regular rhythm.     Heart sounds: Normal heart sounds.  Pulmonary:     Effort: Pulmonary effort is normal. No respiratory distress.  Abdominal:     General: There is no distension.     Palpations: Abdomen is soft.  Musculoskeletal:        General: Normal range of motion.     Cervical back: Normal range of motion.  Skin:    General: Skin is warm and dry.  Neurological:     Mental Status: She is alert.      UC Treatments / Results  Labs (all labs ordered are listed, but only abnormal results are displayed) Labs Reviewed  POCT INFLUENZA A/B  POC SARS CORONAVIRUS 2 AG -  ED    EKG   Radiology No results found.  Procedures Procedures (including critical care time)  Medications Ordered in UC Medications - No data to display  Initial Impression / Assessment and Plan / UC Course  I have reviewed the triage vital signs and the nursing notes.  Pertinent labs & imaging results that were available during my care of the patient were reviewed by me and considered in my medical decision making (see chart for  details).     COVID test are negative.  Viral URI discussed Final Clinical Impressions(s) / UC Diagnoses   Final diagnoses:  Upper respiratory  infection, viral     Discharge Instructions      COVID test is negative Flu tests negative You have a viral illness that should resolve over time with over-the-counter products.  Flonase may help with the ear congestion and postnasal drip Drink lots of fluids See your doctor if not improving by Monday     ED Prescriptions   None    PDMP not reviewed this encounter.   Eustace Moore, MD 11/22/23 [phone]

## 2023-11-22 NOTE — Discharge Instructions (Addendum)
COVID test is negative Flu tests negative You have a viral illness that should resolve over time with over-the-counter products.  Flonase may help with the ear congestion and postnasal drip Drink lots of fluids See your doctor if not improving by Monday

## 2023-12-04 ENCOUNTER — Ambulatory Visit: Payer: 59 | Admitting: Professional

## 2023-12-04 ENCOUNTER — Encounter: Payer: Self-pay | Admitting: Professional

## 2023-12-04 DIAGNOSIS — F4310 Post-traumatic stress disorder, unspecified: Secondary | ICD-10-CM | POA: Diagnosis not present

## 2023-12-04 DIAGNOSIS — F331 Major depressive disorder, recurrent, moderate: Secondary | ICD-10-CM

## 2023-12-04 DIAGNOSIS — Z62810 Personal history of physical and sexual abuse in childhood: Secondary | ICD-10-CM
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
## 2023-12-06 ENCOUNTER — Ambulatory Visit
Admission: EM | Admit: 2023-12-06 | Discharge: 2023-12-06 | Disposition: A | Discharge status: Home / Self Care | Payer: 59 | Attending: Family Medicine | Admitting: Family Medicine

## 2023-12-06 ENCOUNTER — Encounter: Payer: Self-pay | Admitting: Emergency Medicine

## 2023-12-06 DIAGNOSIS — G43919 Migraine, unspecified, intractable, without status migrainosus: Secondary | ICD-10-CM

## 2023-12-06 MED ORDER — ONDANSETRON 4 MG PO TBDP
4.0000 mg | ORAL_TABLET | Freq: Once | ORAL | Status: AC
  Administered 2023-12-06: 4 mg via ORAL

## 2023-12-06 MED ORDER — KETOROLAC TROMETHAMINE 30 MG/ML IJ SOLN
30.0000 mg | Freq: Once | INTRAMUSCULAR | Status: AC
  Administered 2023-12-06: 30 mg via INTRAMUSCULAR

## 2023-12-06 MED ORDER — BUTALBITAL-APAP-CAFFEINE 50-325-40 MG PO TABS: 1.0000 | ORAL_TABLET | Freq: Four times a day (QID) | ORAL | 0 refills | Status: AC | PRN

## 2023-12-06 MED ORDER — ONDANSETRON HCL 8 MG PO TABS: 8.0000 mg | ORAL_TABLET | Freq: Three times a day (TID) | ORAL | 0 refills | Status: DC | PRN

## 2023-12-06 NOTE — ED Provider Notes (Signed)
 Christina Mcbride C[facility]    CSN: [id number] Arrival date & time: 12/06/23  9188      History   Chief Complaint Chief Complaint  Patient presents with   Migraine    HPI Christina Mcbride is a 53 y.o. female.   HPI  Patient has a history of migraines.  She woke up with a migraine this morning.  She has had nausea but no vomiting.  She has light sensitivity.  She states she did not have any nausea or migraine medicine to take at home.  She is advised to take this up with her primary care doctor for follow-up  Past Medical History:  Diagnosis Date   Amenia 11/2018   Blood transfusion without reported diagnosis    COVID-19 virus infection - May 2021 04/15/2020   Migraine    Orthostatic hypotension    PTSD (post-traumatic stress disorder)    reports daughter was murdered    Tachycardia    Thyroid  disease    Vitamin D  deficiency     Patient Active Problem List   Diagnosis Date Noted   Venous insufficiency (chronic) (peripheral) 01/05/2022   Paraesophageal hiatal hernia 07/16/2020   Obesity, Class III, BMI 40-49.9 (morbid obesity) (HCC) 07/16/2020   Occult blood positive stool 07/16/2020   Acute on chronic blood loss anemia 07/16/2020   Orthostatic hypotension 11/29/2018   Chronic daily headache 06/08/2014   Gastroesophageal reflux disease without esophagitis 06/08/2014   Iron  deficiency anemia due to chronic blood loss 03/06/2014   Left shoulder pain 03/06/2014   Anemia 12/27/2013   Symptomatic anemia 12/27/2013   Tachycardia 12/27/2013   Bilateral leg edema 12/27/2013   Goiter 12/27/2013   Dizziness 12/27/2013   Generalized weakness 12/27/2013   Low iron  stores 12/27/2013   UTI (urinary tract infection) 12/27/2013   PTSD (post-traumatic stress disorder)    Migraine     Past Surgical History:  Procedure Laterality Date   BIOPSY  10/17/2019   Procedure: BIOPSY;  Surgeon: Legrand Victory LITTIE DOUGLAS, MD;  Location: [facility] ENDOSCOPY;  Service: Gastroenterology;;    COLONOSCOPY WITH PROPOFOL  N/A 10/17/2019   Procedure: COLONOSCOPY WITH PROPOFOL ;  Surgeon: Legrand Victory LITTIE DOUGLAS, MD;  Location[facility] ENDOSCOPY;  Service: Gastroenterology;  Laterality: N/A;   ESOPHAGOGASTRODUODENOSCOPY (EGD) WITH PROPOFOL  N/A 10/17/2019   Procedure: ESOPHAGOGASTRODUODENOSCOPY (EGD) WITH PROPOFOL ;  Surgeon: Legrand Victory LITTIE DOUGLAS, MD;  Location: [facility] ENDOSCOPY;  Service: Gastroenterology;  Laterality: N/A;   TUBAL LIGATION      OB History   No obstetric history on file.      Home Medications    Prior to Admission medications   Medication Sig Start Date End Date Taking? Authorizing Provider  atorvastatin  (LIPITOR) 20 MG tablet Take 20 mg by mouth daily.   Yes [provider]  butalbital -acetaminophen -caffeine  (FIORICET ) 50-325-40 MG tablet Take 1-2 tablets by mouth every 6 (six) hours as needed for headache. 12/06/23 12/05/24 Yes Maranda Jamee Jacob, MD  escitalopram  (LEXAPRO ) 10 MG tablet Take 1 tablet (10 mg total) by mouth at bedtime. 08/29/23  Yes Rucker, Torrence GRADE, MD  ferrous sulfate  325 (65 FE) MG tablet Take 1 tablet (325 mg total) by mouth 2 (two) times daily with a meal. 08/29/23  Yes Rucker, Torrence GRADE, MD  Multiple Vitamin (MULTIVITAMIN ADULT) TABS Take 2 tablets by mouth daily.   Yes [provider]  ondansetron  (ZOFRAN ) 8 MG tablet Take 1 tablet (8 mg total) by mouth every 8 (eight) hours as needed for nausea or vomiting. 12/06/23  Yes  Maranda Jamee Jacob, MD  OZEMPIC , 0.25 OR 0.5 MG/DOSE, 2 MG/3ML SOPN Inject 0.5 mg into the skin once a week. 08/29/23  Yes Rucker, Torrence GRADE, MD    Family History Family History  Problem Relation Age of Onset   Hypertension Mother    Arthritis Mother    Alzheimer's disease Mother    Diabetes Maternal Grandmother    Hypertension Son    Cancer Maternal Aunt     Social History Social History   Tobacco Use   Smoking status: Never    Passive exposure: Never   Smokeless tobacco: Never  Vaping Use   Vaping status: Never  Used  Substance Use Topics   Alcohol use: No   Drug use: No     Allergies   Bee venom   Review of Systems Review of Systems  See HPI Physical Exam Triage Vital Signs ED Triage Vitals  Encounter Vitals Group     BP 12/06/23 0833 (!) 148/92     Systolic BP Percentile --      Diastolic BP Percentile --      Pulse Rate 12/06/23 0833 85     Resp 12/06/23 0833 18     Temp 12/06/23 0833 97.6 F (36.4 C)     Temp Source 12/06/23 0833 Oral     SpO2 12/06/23 0833 98 %     Weight 12/06/23 0835 280 lb (127 kg)     Height 12/06/23 0835 5' 7 (1.702 m)     Head Circumference --      Peak Flow --      Pain Score 12/06/23 0835 9     Pain Loc --      Pain Education --      Exclude from Growth Chart --    No data found.  Updated Vital Signs BP (!) 148/92 (BP Location: Right Arm)   Pulse 85   Temp 97.6 F (36.4 C) (Oral)   Resp 18   Ht 5' 7 (1.702 m)   Wt 127 kg   LMP 05/04/2016   SpO2 98%   BMI 43.85 kg/m      Physical Exam Constitutional:      General: She is not in acute distress.    Appearance: She is well-developed. She is obese. She is ill-appearing.     Comments: Patient is uncomfortable.  Photophobia.  HENT:     Head: Normocephalic and atraumatic.  Eyes:     Extraocular Movements: Extraocular movements intact.     Conjunctiva/sclera: Conjunctivae normal.     Pupils: Pupils are equal, round, and reactive to light.  Cardiovascular:     Rate and Rhythm: Normal rate.  Pulmonary:     Effort: Pulmonary effort is normal. No respiratory distress.  Musculoskeletal:        General: Normal range of motion.     Cervical back: Normal range of motion.  Skin:    General: Skin is warm and dry.  Neurological:     General: No focal deficit present.     Mental Status: She is alert.     Gait: Gait normal.      UC Treatments / Results  Labs (all labs ordered are listed, but only abnormal results are displayed) Labs Reviewed - No data to  display  EKG   Radiology No results found.  Procedures Procedures (including critical care time)  Medications Ordered in UC Medications  ondansetron  (ZOFRAN -ODT) disintegrating tablet 4 mg (4 mg Oral Given 12/06/23 0837)  ketorolac  (TORADOL ) 30  MG/ML injection 30 mg (30 mg Intramuscular Given 12/06/23 0847)    Initial Impression / Assessment and Plan / UC Course  I have reviewed the triage vital signs and the nursing notes.  Pertinent labs & imaging results that were available during my care of the patient were reviewed by me and considered in my medical decision making (see chart for details).     Final Clinical Impressions(s) / UC Diagnoses   Final diagnoses:  Intractable migraine without status migrainosus, unspecified migraine type     Discharge Instructions      Home to rest I have sent fioricet  to the pharmacy if needed in addition for headache Also have sent zofran  for home use   ED Prescriptions     Medication Sig Dispense Auth. Provider   ondansetron  (ZOFRAN ) 8 MG tablet Take 1 tablet (8 mg total) by mouth every 8 (eight) hours as needed for nausea or vomiting. 20 tablet Maranda Jamee Jacob, MD   butalbital -acetaminophen -caffeine  (FIORICET ) 50-325-40 MG tablet Take 1-2 tablets by mouth every 6 (six) hours as needed for headache. 20 tablet Maranda Jamee Jacob, MD      I have reviewed the PDMP during this encounter.   Maranda Jamee Jacob, MD 12/06/23 1249

## 2023-12-06 NOTE — Discharge Instructions (Signed)
 Home to rest I have sent fioricet to the pharmacy if needed in addition for headache Also have sent zofran for home use

## 2023-12-06 NOTE — ED Triage Notes (Signed)
 Patient c/o waking up this morning around 3 am with a migraine.  Patient is having light sensitivity, nausea and no feeling.  Patient denies taken an OTC pain meds.

## 2023-12-11 ENCOUNTER — Encounter: Payer: Self-pay | Admitting: Professional

## 2023-12-11 ENCOUNTER — Ambulatory Visit: Payer: 59 | Admitting: Professional

## 2023-12-11 DIAGNOSIS — F4310 Post-traumatic stress disorder, unspecified: Secondary | ICD-10-CM

## 2023-12-11 DIAGNOSIS — Z62810 Personal history of physical and sexual abuse in childhood: Secondary | ICD-10-CM

## 2023-12-11 DIAGNOSIS — F331 Major depressive disorder, recurrent, moderate: Secondary | ICD-10-CM | POA: Diagnosis not present
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
## 2023-12-19 ENCOUNTER — Ambulatory Visit: Payer: 59 | Admitting: Family Medicine

## 2023-12-21 ENCOUNTER — Encounter: Payer: Self-pay | Admitting: Family Medicine

## 2023-12-21 ENCOUNTER — Ambulatory Visit (INDEPENDENT_AMBULATORY_CARE_PROVIDER_SITE_OTHER): Payer: 59 | Admitting: Family Medicine

## 2023-12-21 VITALS — BP 148/88 | HR 71 | Temp 97.8°F | Resp 18 | Ht 67.0 in | Wt 289.3 lb

## 2023-12-21 DIAGNOSIS — Z6841 Body Mass Index (BMI) 40.0 and over, adult: Secondary | ICD-10-CM

## 2023-12-21 DIAGNOSIS — R7303 Prediabetes: Secondary | ICD-10-CM

## 2023-12-21 DIAGNOSIS — Z1231 Encounter for screening mammogram for malignant neoplasm of breast: Secondary | ICD-10-CM | POA: Diagnosis not present

## 2023-12-21 DIAGNOSIS — Z23 Encounter for immunization: Secondary | ICD-10-CM

## 2023-12-21 DIAGNOSIS — M25552 Pain in left hip: Secondary | ICD-10-CM

## 2023-12-21 MED ORDER — SEMAGLUTIDE (1 MG/DOSE) 4 MG/3ML ~~LOC~~ SOPN: 1.0000 mg | PEN_INJECTOR | SUBCUTANEOUS | 2 refills | Status: DC

## 2023-12-21 NOTE — Progress Notes (Signed)
Established Patient Office Visit  Subjective   Patient ID: Christina Mcbride, female    DOB: [birth year]  Age: 53 y.o. MRN: [id number]  No chief complaint on file.   HPI  Left hip pain Pt has had issues with her left hip since 2019. She reports it's hard to get comfortable at night when laying down along with walking or standing. She has used a heating pad that has helped some at night. Pt was on an anti-flammatory in the past. She hasn't tried OTC medicines because she felt like they didn't help. She also has used Loss adjuster, chartered. She reports the pain is in the lower back and left hip. Pt reports she was established with Ortho last year and has had xrays done. Diagnosed with OA but reports it wasn't that bad.  She feels like her weight is playing a factor.   Weight management She was on Ozempic for a year before seeing me to establish care on 08/2023. This was refilled at 0.5 mg weekly in October 2024. Pt was advised to follow up in 1 month. She didn't do so due to work scheduled. She does report eating late and has admitted to eating fast food. She hasn't had exercise except a lot of walking and lifting at work.   Pt would like flu vaccine  Pt reports she needs order for mammogram.  Review of Systems  Constitutional:        Weight gain  Musculoskeletal:  Positive for joint pain.       Left hip pain  All other systems reviewed and are negative.    Objective:     LMP 05/04/2016  BP Readings from Last 3 Encounters:  12/21/23 (!) 148/88  12/06/23 (!) 148/92  11/22/23 (!) 153/87      Physical Exam Vitals and nursing note reviewed.  Constitutional:      Appearance: Normal appearance. She is normal weight.  HENT:     Head: Normocephalic and atraumatic.     Right Ear: External ear normal.     Left Ear: External ear normal.     Nose: Nose normal.     Mouth/Throat:     Mouth: Mucous membranes are moist.     Pharynx: Oropharynx is clear.  Eyes:     Conjunctiva/sclera:  Conjunctivae normal.     Pupils: Pupils are equal, round, and reactive to light.  Cardiovascular:     Rate and Rhythm: Normal rate and regular rhythm.     Pulses: Normal pulses.     Heart sounds: Normal heart sounds.  Pulmonary:     Effort: Pulmonary effort is normal.     Breath sounds: Normal breath sounds.  Musculoskeletal:        General: Normal range of motion.  Skin:    General: Skin is warm.     Capillary Refill: Capillary refill takes less than 2 seconds.  Neurological:     General: No focal deficit present.     Mental Status: She is alert and oriented to person, place, and time. Mental status is at baseline.  Psychiatric:        Mood and Affect: Mood normal.        Behavior: Behavior normal.        Thought Content: Thought content normal.        Judgment: Judgment normal.    No results found for any visits on 12/21/23.  Last hemoglobin A1c Lab Results  Component Value Date   HGBA1C 5.9 (H) 08/29/2023  The ASCVD Risk score (Arnett DK, et al., 2019) failed to calculate for the following reasons:   Cannot find a previous HDL lab   Cannot find a previous total cholesterol lab    Assessment & Plan:   Problem List Items Addressed This Visit   None Left hip pain  BMI 45.0-49.9, adult (HCC) -     Semaglutide (1 MG/DOSE); Inject 1 mg as directed once a week.  Dispense: 3 mL; Refill: 2  Need for influenza vaccination -     Flu vaccine trivalent PF, 6mos and older(Flulaval,Afluria,Fluarix,Fluzone)  Prediabetes -     Hemoglobin A1c -     Basic metabolic panel -     Semaglutide (1 MG/DOSE); Inject 1 mg as directed once a week.  Dispense: 3 mL; Refill: 2  Screening mammogram for breast cancer -     3D Screening Mammogram, Left and Right; Future   Pt with chronic left hip pain. Advised to follow up with Ortho. Due to weight gain, advised on exercise and diet counseling. Increase Ozempic from 0.5 mg to 1 mg weekly. Follow up in 4 weeks. Flu vaccine today. Sent  order for mammogram.  No follow-ups on file.    Suzan Slick, MD

## 2023-12-22 LAB — HEMOGLOBIN A1C
Est. average glucose Bld gHb Est-mCnc: 123 mg/dL
Hgb A1c MFr Bld: 5.9 % — ABNORMAL HIGH (ref 4.8–5.6)

## 2023-12-22 LAB — BASIC METABOLIC PANEL
BUN/Creatinine Ratio: 15 (ref 9–23)
BUN: 14 mg/dL (ref 6–24)
CO2: 23 mmol/L (ref 20–29)
Calcium: 9.3 mg/dL (ref 8.7–10.2)
Chloride: 104 mmol/L (ref 96–106)
Creatinine, Ser: 0.94 mg/dL (ref 0.57–1.00)
Glucose: 86 mg/dL (ref 70–99)
Potassium: 4.7 mmol/L (ref 3.5–5.2)
Sodium: 142 mmol/L (ref 134–144)
eGFR: 73 mL/min/{1.73_m2} (ref >59)

## 2023-12-24 ENCOUNTER — Encounter: Payer: Self-pay | Admitting: Family Medicine

## 2023-12-24 ENCOUNTER — Encounter: Payer: Self-pay | Admitting: Professional

## 2023-12-24 ENCOUNTER — Ambulatory Visit: Payer: 59 | Admitting: Professional

## 2023-12-24 DIAGNOSIS — F331 Major depressive disorder, recurrent, moderate: Secondary | ICD-10-CM | POA: Diagnosis not present

## 2023-12-24 DIAGNOSIS — F4310 Post-traumatic stress disorder, unspecified: Secondary | ICD-10-CM

## 2023-12-24 DIAGNOSIS — Z62810 Personal history of physical and sexual abuse in childhood: Secondary | ICD-10-CM | POA: Diagnosis not present
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
## 2024-01-08 ENCOUNTER — Encounter: Payer: Self-pay | Admitting: Professional

## 2024-01-08 ENCOUNTER — Ambulatory Visit: Payer: 59 | Admitting: Professional

## 2024-01-08 DIAGNOSIS — F331 Major depressive disorder, recurrent, moderate: Secondary | ICD-10-CM

## 2024-01-08 DIAGNOSIS — F4310 Post-traumatic stress disorder, unspecified: Secondary | ICD-10-CM

## 2024-01-08 DIAGNOSIS — Z62810 Personal history of physical and sexual abuse in childhood: Secondary | ICD-10-CM

## 2024-01-08 NOTE — Progress Notes (Signed)
A user error has taken place: encounter opened in error, closed for administrative reasons.

## 2024-01-11 ENCOUNTER — Encounter: Payer: Self-pay | Admitting: Internal Medicine

## 2024-01-15 ENCOUNTER — Ambulatory Visit: Payer: 59 | Admitting: Professional

## 2024-01-17 ENCOUNTER — Ambulatory Visit: Payer: 59 | Admitting: Professional

## 2024-01-17 ENCOUNTER — Encounter: Payer: Self-pay | Admitting: Professional

## 2024-01-17 DIAGNOSIS — Z62810 Personal history of physical and sexual abuse in childhood: Secondary | ICD-10-CM | POA: Diagnosis not present

## 2024-01-17 DIAGNOSIS — F331 Major depressive disorder, recurrent, moderate: Secondary | ICD-10-CM | POA: Diagnosis not present

## 2024-01-17 DIAGNOSIS — F4310 Post-traumatic stress disorder, unspecified: Secondary | ICD-10-CM
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
## 2024-01-18 ENCOUNTER — Ambulatory Visit (INDEPENDENT_AMBULATORY_CARE_PROVIDER_SITE_OTHER): Payer: 59 | Admitting: Family Medicine

## 2024-01-18 ENCOUNTER — Encounter: Payer: Self-pay | Admitting: Family Medicine

## 2024-01-18 VITALS — BP 96/53 | HR 79 | Temp 98.0°F | Resp 18 | Ht 67.0 in | Wt 290.1 lb

## 2024-01-18 DIAGNOSIS — R0683 Snoring: Secondary | ICD-10-CM

## 2024-01-18 DIAGNOSIS — R635 Abnormal weight gain: Secondary | ICD-10-CM | POA: Diagnosis not present

## 2024-01-18 DIAGNOSIS — E782 Mixed hyperlipidemia: Secondary | ICD-10-CM | POA: Diagnosis not present

## 2024-01-18 DIAGNOSIS — Z1329 Encounter for screening for other suspected endocrine disorder: Secondary | ICD-10-CM | POA: Diagnosis not present

## 2024-01-18 DIAGNOSIS — Z23 Encounter for immunization: Secondary | ICD-10-CM

## 2024-01-18 MED ORDER — ATORVASTATIN CALCIUM 20 MG PO TABS: 20.0000 mg | ORAL_TABLET | Freq: Every day | ORAL | 1 refills | Status: DC

## 2024-01-18 NOTE — Progress Notes (Signed)
Established Patient Office Visit  Subjective   Patient ID: Christina Mcbride, female    DOB: Dec 14, 1970  Age: 53 y.o. MRN: [id number]  Chief Complaint  Patient presents with   Medical Management of Chronic Issues    Patient is here for a 4 week follow up for weight management, Refill for Atorvastatin 20 mg, and shingrix vaccination     HPI  Weight management Pt has been on Ozempic and this has been increased to 1mg  weekly 4 weeks ago. She has gained 1 lb. She is unsure why this is the case as she has decreased appetite. Pt reports weight gain despite this. She does report snoring and feeling fatigue. She reports she feels like she never can get enough sleep.   Pt needs refills on her Atorvastatin for her HLD.   Pt would like her shingrix vaccine. She has dual coverage with medicare and medicaid. She has had her AWV done on 01/03/24.  Review of Systems  Constitutional:  Positive for malaise/fatigue.       Weight gain  Psychiatric/Behavioral:         Snoring  All other systems reviewed and are negative.    Objective:     BP (!) 96/53   Pulse 79   Temp 98 F (36.7 C) (Oral)   Resp 18   Ht 5\' 7"  (1.702 m)   Wt 290 lb 1.6 oz (131.6 kg)   LMP 05/04/2016   SpO2 96%   BMI 45.44 kg/m  BP Readings from Last 3 Encounters:  01/18/24 (!) 96/53  12/21/23 (!) 148/88  12/06/23 (!) 148/92      Physical Exam Vitals and nursing note reviewed.  Constitutional:      Appearance: Normal appearance. She is obese.  HENT:     Head: Normocephalic and atraumatic.     Right Ear: External ear normal.     Left Ear: External ear normal.     Nose: Nose normal.     Mouth/Throat:     Mouth: Mucous membranes are moist.     Pharynx: Oropharynx is clear.  Eyes:     Conjunctiva/sclera: Conjunctivae normal.     Pupils: Pupils are equal, round, and reactive to light.  Cardiovascular:     Rate and Rhythm: Normal rate.  Pulmonary:     Effort: Pulmonary effort is normal.  Skin:     Capillary Refill: Capillary refill takes less than 2 seconds.  Neurological:     General: No focal deficit present.     Mental Status: She is alert and oriented to person, place, and time. Mental status is at baseline.  Psychiatric:        Mood and Affect: Mood normal.        Behavior: Behavior normal.        Thought Content: Thought content normal.        Judgment: Judgment normal.    No results found for any visits on 01/18/24.  Last hemoglobin A1c Lab Results  Component Value Date   HGBA1C 5.9 (H) 12/21/2023   Last thyroid functions Lab Results  Component Value Date   TSH 1.683 07/20/2019      The ASCVD Risk score (Arnett DK, et al., 2019) failed to calculate for the following reasons:   Cannot find a previous HDL lab   Cannot find a previous total cholesterol lab    Assessment & Plan:   Problem List Items Addressed This Visit   None  Weight gain -  Ambulatory referral to Pulmonology -     TSH -     T4, free  Screening for thyroid disorder -     TSH -     T4, free  Snoring -     Ambulatory referral to Pulmonology  Need for shingles vaccine -     Varicella-zoster vaccine IM  Mixed hyperlipidemia -     Atorvastatin Calcium; Take 1 tablet (20 mg total) by mouth daily.  Dispense: 90 tablet; Refill: 1   Due to continued weight gain despite using Ozempic 1mg  weekly, will need to look for other medical conditions that could cause unexplained weight gain. Pt reports fatigue and snoring, will refer for sleep study to rule out OSA. Also will screen thyroid disorder. Refilled Atorvastatin 20mg  daily for HLD.  Shingrix #1 today, will return in 3 months for CPE and will do #2 Shingrix at that time. No follow-ups on file.    Suzan Slick, MD

## 2024-01-22 ENCOUNTER — Encounter: Payer: Self-pay | Admitting: Professional

## 2024-01-22 ENCOUNTER — Ambulatory Visit: Payer: 59 | Admitting: Professional

## 2024-01-22 DIAGNOSIS — F4310 Post-traumatic stress disorder, unspecified: Secondary | ICD-10-CM | POA: Diagnosis not present

## 2024-01-22 DIAGNOSIS — F331 Major depressive disorder, recurrent, moderate: Secondary | ICD-10-CM

## 2024-01-22 DIAGNOSIS — Z62810 Personal history of physical and sexual abuse in childhood: Secondary | ICD-10-CM | POA: Diagnosis not present
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
## 2024-01-31 ENCOUNTER — Encounter: Payer: Self-pay | Admitting: Professional

## 2024-01-31 ENCOUNTER — Ambulatory Visit: Payer: 59 | Admitting: Professional

## 2024-01-31 DIAGNOSIS — Z62810 Personal history of physical and sexual abuse in childhood: Secondary | ICD-10-CM | POA: Diagnosis not present

## 2024-01-31 DIAGNOSIS — F4310 Post-traumatic stress disorder, unspecified: Secondary | ICD-10-CM | POA: Diagnosis not present

## 2024-01-31 DIAGNOSIS — F331 Major depressive disorder, recurrent, moderate: Secondary | ICD-10-CM
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
## 2024-02-05 ENCOUNTER — Ambulatory Visit: Payer: 59 | Admitting: Professional

## 2024-02-13 ENCOUNTER — Ambulatory Visit: Payer: 59

## 2024-02-18 ENCOUNTER — Ambulatory Visit
Admission: EM | Admit: 2024-02-18 | Discharge: 2024-02-18 | Disposition: A | Discharge status: Home / Self Care | Attending: Family Medicine | Admitting: Family Medicine

## 2024-02-18 ENCOUNTER — Other Ambulatory Visit: Payer: Self-pay

## 2024-02-18 DIAGNOSIS — R112 Nausea with vomiting, unspecified: Secondary | ICD-10-CM | POA: Diagnosis not present

## 2024-02-18 DIAGNOSIS — R197 Diarrhea, unspecified: Secondary | ICD-10-CM | POA: Diagnosis not present

## 2024-02-18 MED ORDER — ONDANSETRON 4 MG PO TBDP
4.0000 mg | ORAL_TABLET | Freq: Once | ORAL | Status: AC
  Administered 2024-02-18: 4 mg via ORAL

## 2024-02-18 NOTE — ED Provider Notes (Signed)
 [facility]    CSN: [id number] Arrival date & time: 02/18/24  1557      History   Chief Complaint Chief Complaint  Patient presents with   Emesis   Diarrhea    HPI Christina Mcbride is a 53 y.o. female.   HPI  Patient states that she had a salmon dinner out at a restaurant Saturday night.  12 hours later woke up Sunday morning with vomiting.  She has had nausea vomiting and diarrhea ever since then.  No fever or chills.  No abdominal pain.  Does not know whether it stomach flu or food poisoning.  Her significant other was with her at the dinner.  He also has similar symptoms that started the following morning.  Past Medical History:  Diagnosis Date   [place] 11/2018   Blood transfusion without reported diagnosis    COVID-19 virus infection - May 2021 04/15/2020   Migraine    Orthostatic hypotension    PTSD (post-traumatic stress disorder)    reports daughter was murdered    Tachycardia    Thyroid disease    Vitamin D deficiency     Patient Active Problem List   Diagnosis Date Noted   Venous insufficiency (chronic) (peripheral) 01/05/2022   Paraesophageal hiatal hernia 07/16/2020   Obesity, Class III, BMI 40-49.9 (morbid obesity) (HCC) 07/16/2020   Occult blood positive stool 07/16/2020   Acute on chronic blood loss anemia 07/16/2020   Orthostatic hypotension 11/29/2018   Chronic daily headache 06/08/2014   Gastroesophageal reflux disease without esophagitis 06/08/2014   Iron deficiency anemia due to chronic blood loss 03/06/2014   Left shoulder pain 03/06/2014   Anemia 12/27/2013   Symptomatic anemia 12/27/2013   Tachycardia 12/27/2013   Bilateral leg edema 12/27/2013   Goiter 12/27/2013   Dizziness 12/27/2013   Generalized weakness 12/27/2013   Low iron stores 12/27/2013   UTI (urinary tract infection) 12/27/2013   PTSD (post-traumatic stress disorder)    Migraine     Past Surgical History:  Procedure Laterality Date   BIOPSY  10/17/2019    Procedure: BIOPSY;  Surgeon: Sherrilyn Rist, MD;  Location: [facility] ENDOSCOPY;  Service: Gastroenterology;;   COLONOSCOPY WITH PROPOFOL N/A 10/17/2019   Procedure: COLONOSCOPY WITH PROPOFOL;  Surgeon: Sherrilyn Rist, MD;  Location: [facility] ENDOSCOPY;  Service: Gastroenterology;  Laterality: N/A;   ESOPHAGOGASTRODUODENOSCOPY (EGD) WITH PROPOFOL N/A 10/17/2019   Procedure: ESOPHAGOGASTRODUODENOSCOPY (EGD) WITH PROPOFOL;  Surgeon: Sherrilyn Rist, MD;  Location: [facility] ENDOSCOPY;  Service: Gastroenterology;  Laterality: N/A;   TUBAL LIGATION      OB History   No obstetric history on file.      Home Medications    Prior to Admission medications   Medication Sig Start Date End Date Taking? Authorizing Provider  atorvastatin (LIPITOR) 20 MG tablet Take 1 tablet (20 mg total) by mouth daily. 01/18/24   Suzan Slick, MD  butalbital-acetaminophen-caffeine (FIORICET) [phone] MG tablet Take 1-2 tablets by mouth every 6 (six) hours as needed for headache. 12/06/23 12/05/24  Eustace Moore, MD  escitalopram (LEXAPRO) 10 MG tablet Take 1 tablet (10 mg total) by mouth at bedtime. 08/29/23   Suzan Slick, MD  ferrous sulfate 325 (65 FE) MG tablet Take 1 tablet (325 mg total) by mouth 2 (two) times daily with a meal. 08/29/23   Rucker, Magdalen Spatz, MD  Multiple Vitamin (MULTIVITAMIN ADULT) TABS Take 2 tablets by mouth daily.    [provider]  ondansetron [facility])  8 MG tablet Take 1 tablet (8 mg total) by mouth every 8 (eight) hours as needed for nausea or vomiting. 12/06/23   Eustace Moore, MD  Semaglutide, 1 MG/DOSE, 4 MG/3ML SOPN Inject 1 mg as directed once a week. 12/21/23   Suzan Slick, MD    Family History Family History  Problem Relation Age of Onset   Hypertension Mother    Arthritis Mother    Alzheimer's disease Mother    Diabetes Maternal Grandmother    Hypertension Son    Cancer Maternal Aunt     Social History Social History   Tobacco Use   Smoking  status: Never    Passive exposure: Never   Smokeless tobacco: Never  Vaping Use   Vaping status: Never Used  Substance Use Topics   Alcohol use: No   Drug use: No     Allergies   Bee venom and Cat dander   Review of Systems Review of Systems See HPI  Physical Exam Triage Vital Signs ED Triage Vitals  Encounter Vitals Group     BP 02/18/24 1620 (!) 148/96     Systolic BP Percentile --      Diastolic BP Percentile --      Pulse Rate 02/18/24 1620 84     Resp 02/18/24 1620 16     Temp 02/18/24 1620 98.2 F (36.8 C)     Temp src --      SpO2 02/18/24 1620 96 %     Weight --      Height --      Head Circumference --      Peak Flow --      Pain Score 02/18/24 1622 0     Pain Loc --      Pain Education --      Exclude from Growth Chart --    No data found.  Updated Vital Signs BP (!) 148/96   Pulse 84   Temp 98.2 F (36.8 C)   Resp 16   LMP 05/04/2016   SpO2 96%      Physical Exam Constitutional:      General: She is not in acute distress.    Appearance: She is well-developed. She is ill-appearing.  HENT:     Head: Normocephalic and atraumatic.  Eyes:     Conjunctiva/sclera: Conjunctivae normal.     Pupils: Pupils are equal, round, and reactive to light.  Cardiovascular:     Rate and Rhythm: Normal rate.  Pulmonary:     Effort: Pulmonary effort is normal. No respiratory distress.  Abdominal:     General: There is no distension.     Palpations: Abdomen is soft.     Comments: Bowel sounds are active.  Abdomen nontender  Musculoskeletal:        General: Normal range of motion.     Cervical back: Normal range of motion.  Skin:    General: Skin is warm and dry.  Neurological:     Mental Status: She is alert.      UC Treatments / Results  Labs (all labs ordered are listed, but only abnormal results are displayed) Labs Reviewed - No data to display  EKG   Radiology No results found.  Procedures Procedures (including critical care  time)  Medications Ordered in UC Medications  ondansetron (ZOFRAN-ODT) disintegrating tablet 4 mg (4 mg Oral Given 02/18/24 1629)    Initial Impression / Assessment and Plan / UC Course  I have reviewed the triage  vital signs and the nursing notes.  Pertinent labs & imaging results that were available during my care of the patient were reviewed by me and considered in my medical decision making (see chart for details).     Discussed preventing dehydration.  Even though patient suspects food poisoning antibiotics are not indicated at this time. Final Clinical Impressions(s) / UC Diagnoses   Final diagnoses:  Nausea vomiting and diarrhea     Discharge Instructions      May take Zofran 2-3 times a day Continue with sips of fluids May tolerate fluids you can add a bland diet Call for problems   ED Prescriptions   None    PDMP not reviewed this encounter.   Eustace Moore, MD 02/18/24 Mikle Bosworth

## 2024-02-18 NOTE — ED Triage Notes (Signed)
 Ate salmon for dinner Saturday, on Sunday morning started having n/v/d. No fever. No otc medicines.

## 2024-02-18 NOTE — Discharge Instructions (Signed)
 May take Zofran 2-3 times a day Continue with sips of fluids May tolerate fluids you can add a bland diet Call for problems

## 2024-02-19 ENCOUNTER — Ambulatory Visit (INDEPENDENT_AMBULATORY_CARE_PROVIDER_SITE_OTHER): Payer: 59 | Admitting: Professional

## 2024-02-19 ENCOUNTER — Encounter: Payer: Self-pay | Admitting: Professional

## 2024-02-19 DIAGNOSIS — F4310 Post-traumatic stress disorder, unspecified: Secondary | ICD-10-CM

## 2024-02-19 DIAGNOSIS — F331 Major depressive disorder, recurrent, moderate: Secondary | ICD-10-CM

## 2024-02-19 DIAGNOSIS — Z62810 Personal history of physical and sexual abuse in childhood: Secondary | ICD-10-CM
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
## 2024-03-04 ENCOUNTER — Ambulatory Visit (INDEPENDENT_AMBULATORY_CARE_PROVIDER_SITE_OTHER): Payer: 59 | Admitting: Professional

## 2024-03-04 ENCOUNTER — Encounter: Payer: Self-pay | Admitting: Professional

## 2024-03-04 DIAGNOSIS — F331 Major depressive disorder, recurrent, moderate: Secondary | ICD-10-CM | POA: Diagnosis not present

## 2024-03-04 DIAGNOSIS — F4310 Post-traumatic stress disorder, unspecified: Secondary | ICD-10-CM | POA: Diagnosis not present
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
## 2024-03-05 ENCOUNTER — Emergency Department (HOSPITAL_BASED_OUTPATIENT_CLINIC_OR_DEPARTMENT_OTHER)
Admission: EM | Admit: 2024-03-05 | Discharge: 2024-03-05 | Disposition: A | Discharge status: Home / Self Care | Attending: Emergency Medicine | Admitting: Emergency Medicine

## 2024-03-05 ENCOUNTER — Encounter (HOSPITAL_BASED_OUTPATIENT_CLINIC_OR_DEPARTMENT_OTHER): Payer: Self-pay

## 2024-03-05 ENCOUNTER — Other Ambulatory Visit: Payer: Self-pay

## 2024-03-05 ENCOUNTER — Emergency Department (HOSPITAL_BASED_OUTPATIENT_CLINIC_OR_DEPARTMENT_OTHER)

## 2024-03-05 DIAGNOSIS — S39012A Strain of muscle, fascia and tendon of lower back, initial encounter: Secondary | ICD-10-CM | POA: Insufficient documentation

## 2024-03-05 DIAGNOSIS — M25551 Pain in right hip: Secondary | ICD-10-CM | POA: Insufficient documentation

## 2024-03-05 DIAGNOSIS — X500XXA Overexertion from strenuous movement or load, initial encounter: Secondary | ICD-10-CM | POA: Diagnosis not present

## 2024-03-05 DIAGNOSIS — M545 Low back pain, unspecified: Secondary | ICD-10-CM | POA: Diagnosis present

## 2024-03-05 DIAGNOSIS — M25552 Pain in left hip: Secondary | ICD-10-CM | POA: Diagnosis not present

## 2024-03-05 DIAGNOSIS — Y93F2 Activity, caregiving, lifting: Secondary | ICD-10-CM | POA: Insufficient documentation

## 2024-03-05 MED ORDER — METHOCARBAMOL 500 MG PO TABS: 500.0000 mg | ORAL_TABLET | Freq: Two times a day (BID) | ORAL | 0 refills | Status: DC | PRN

## 2024-03-05 MED ORDER — NAPROXEN 500 MG PO TABS
500.0000 mg | ORAL_TABLET | Freq: Two times a day (BID) | ORAL | 0 refills | Status: DC
Start: 2024-03-05 — End: 2024-10-08

## 2024-03-05 NOTE — ED Notes (Signed)
 Pt transported to imaging.

## 2024-03-05 NOTE — ED Provider Notes (Signed)
 [facility] Provider Note   CSN: [id number] Arrival date & time: 03/05/24  1035     History  Chief Complaint  Patient presents with   Back Pain    Christina Mcbride is a 53 y.o. female.  She is here with complaint of worsening low back pain and pain in both of her hips that is been going on and off for a while but has been worse over the last few weeks.  She works at [employer] and does a lot of lifting.  Does not recall any specific trauma.  Has been using tramadol in the past and salan patch with intermittent relief.  Has a lot of morning pain and stiffness.  No abdominal pain or urinary symptoms no numbness or weakness in her extremities no bowel or bladder incontinence.  She is postmenopausal.  No fever or weight loss.  The history is provided by the patient.  Back Pain Location:  Lumbar spine Quality:  Aching Pain severity:  Moderate Onset quality:  Gradual Timing:  Constant Progression:  Worsening Relieved by:  Nothing Worsened by:  Ambulation and bending Ineffective treatments:  OTC medications Associated symptoms: no abdominal pain, no bladder incontinence, no bowel incontinence, no fever, no numbness and no weakness        Home Medications Prior to Admission medications   Medication Sig Start Date End Date Taking? Authorizing Provider  atorvastatin (LIPITOR) 20 MG tablet Take 1 tablet (20 mg total) by mouth daily. 01/18/24   Suzan Slick, MD  butalbital-acetaminophen-caffeine (FIORICET) [phone] MG tablet Take 1-2 tablets by mouth every 6 (six) hours as needed for headache. 12/06/23 12/05/24  Eustace Moore, MD  escitalopram (LEXAPRO) 10 MG tablet Take 1 tablet (10 mg total) by mouth at bedtime. 08/29/23   Suzan Slick, MD  ferrous sulfate 325 (65 FE) MG tablet Take 1 tablet (325 mg total) by mouth 2 (two) times daily with a meal. 08/29/23   Rucker, Magdalen Spatz, MD  Multiple Vitamin (MULTIVITAMIN ADULT) TABS Take 2  tablets by mouth daily.    [provider]  ondansetron (ZOFRAN) 8 MG tablet Take 1 tablet (8 mg total) by mouth every 8 (eight) hours as needed for nausea or vomiting. 12/06/23   Eustace Moore, MD  Semaglutide, 1 MG/DOSE, 4 MG/3ML SOPN Inject 1 mg as directed once a week. 12/21/23   Suzan Slick, MD      Allergies    Bee venom and Cat dander    Review of Systems   Review of Systems  Constitutional:  Negative for fever.  Gastrointestinal:  Negative for abdominal pain and bowel incontinence.  Genitourinary:  Negative for bladder incontinence.  Musculoskeletal:  Positive for back pain.  Neurological:  Negative for weakness and numbness.    Physical Exam Updated Vital Signs BP (!) 151/100   Pulse 75   Temp 98.3 F (36.8 C) (Oral)   Ht 5\' 7"  (1.702 m)   Wt 104.3 kg   LMP 05/04/2016   SpO2 100%   BMI 36.02 kg/m  Physical Exam Vitals and nursing note reviewed.  Constitutional:      General: She is not in acute distress.    Appearance: Normal appearance. She is well-developed. She is obese.  HENT:     Head: Normocephalic and atraumatic.  Eyes:     Conjunctiva/sclera: Conjunctivae normal.  Cardiovascular:     Rate and Rhythm: Normal rate and regular rhythm.     Heart  sounds: No murmur heard. Pulmonary:     Effort: Pulmonary effort is normal. No respiratory distress.     Breath sounds: Normal breath sounds.  Abdominal:     Palpations: Abdomen is soft.     Tenderness: There is no abdominal tenderness. There is no guarding or rebound.  Musculoskeletal:        General: Tenderness present. No deformity.     Cervical back: Neck supple.     Comments: She has some diffuse tenderness lumbar and paralumbar.  No step-offs.  Skin:    General: Skin is warm and dry.     Capillary Refill: Capillary refill takes less than 2 seconds.  Neurological:     General: No focal deficit present.     Mental Status: She is alert.     Motor: No weakness.     Gait: Gait normal.      ED Results / Procedures / Treatments   Labs (all labs ordered are listed, but only abnormal results are displayed) Labs Reviewed - No data to display  EKG None  Radiology DG Lumbar Spine Complete Result Date: 03/05/2024 CLINICAL DATA:  Low back pain, increasing back and bilateral hip pain EXAM: LUMBAR SPINE - COMPLETE 4+ VIEW COMPARISON:  July 16, 2020 FINDINGS: Five non rib-bearing lumbar type vertebral bodies. Normal alignment with expected lumbar lordosis. Vertebral body heights are well maintained without acute fracture. Mild intervertebral disc height loss at L5-S1. Right tubal ligation clip noted overlying the right iliac wing. Left pelvic tubal ligation clip again noted. IMPRESSION: No acute fracture or malalignment of the lumbar spine. Mild degenerative disc disease at L5-S1. Electronically Signed   By: Wallie Char M.D.   On: 03/05/2024 13:02   DG Hips Bilat W or Wo Pelvis 3-4 Views Result Date: 03/05/2024 CLINICAL DATA:  Increasing back and bilateral hip pain EXAM: DG HIP (WITH OR WITHOUT PELVIS) 3-4V BILAT COMPARISON:  Apr 18, 2021 FINDINGS: There is no evidence of hip fracture or dislocation. There is no evidence of arthropathy or other focal bone abnormality. Tubal ligation clip in the left pelvis. The right tubal ligation clip is not visualized. IMPRESSION: No acute fracture or dislocation. Electronically Signed   By: Wallie Char M.D.   On: 03/05/2024 12:52    Procedures Procedures    Medications Ordered in ED Medications - No data to display  ED Course/ Medical Decision Making/ A&P Clinical Course as of 03/05/24 1655  Wed Mar 05, 2024  1251 X-rays of pelvis hips and lumbar spine do not show any acute fracture.  Awaiting radiology reading. [MB]  1306 Is being officially read by radiology as no acute findings.  Reviewed with patient.  Return instructions discussed [MB]    Clinical Course User Index [MB] Terrilee Files, MD                                  Medical Decision Making Amount and/or Complexity of Data Reviewed Radiology: ordered.  Risk Prescription drug management.   This patient complains of pain in her lower back and hips; this involves an extensive number of treatment Options and is a complaint that carries with it a high risk of complications and morbidity. The differential includes arthritis, fracture, AVN, musculoskeletal I ordered imaging studies which included lumbar spine and hip and pelvis x-rays and I independently    visualized and interpreted imaging which showed no acute findings Previous records obtained and reviewed  in epic including ED and PCP visits Social determinants considered, depression Critical Interventions: None  After the interventions stated above, I reevaluated the patient and found patient to be ambulatory in no distress Admission and further testing considered, no indications for admission or further workup at this time.  Will treat symptomatically with NSAIDs muscle relaxants and recommended close follow-up with PCP.  Return instructions discussed         Final Clinical Impression(s) / ED Diagnoses Final diagnoses:  Strain of lumbar region, initial encounter  Bilateral hip pain    Rx / DC Orders ED Discharge Orders          Ordered    naproxen (NAPROSYN) 500 MG tablet  2 times daily        03/05/24 1308    methocarbamol (ROBAXIN) 500 MG tablet  2 times daily PRN        03/05/24 1308              Terrilee Files, MD 03/05/24 1657

## 2024-03-05 NOTE — Discharge Instructions (Addendum)
 Please contact your primary care doctor for close follow-up.  We are prescribing you a short course of anti-inflammatories and muscle relaxant.  You can also try ice or heat to your back and hips.

## 2024-03-05 NOTE — ED Triage Notes (Signed)
 Pt states that she has increased pain in her back and bilateral hips. States that she works at [employer].

## 2024-03-08 ENCOUNTER — Other Ambulatory Visit: Payer: Self-pay | Admitting: Family Medicine

## 2024-03-08 DIAGNOSIS — F4310 Post-traumatic stress disorder, unspecified: Secondary | ICD-10-CM

## 2024-03-17 ENCOUNTER — Ambulatory Visit (INDEPENDENT_AMBULATORY_CARE_PROVIDER_SITE_OTHER): Admitting: Professional

## 2024-03-17 ENCOUNTER — Encounter: Payer: Self-pay | Admitting: Professional

## 2024-03-17 DIAGNOSIS — F331 Major depressive disorder, recurrent, moderate: Secondary | ICD-10-CM

## 2024-03-17 DIAGNOSIS — F4310 Post-traumatic stress disorder, unspecified: Secondary | ICD-10-CM
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
## 2024-03-18 ENCOUNTER — Ambulatory Visit: Payer: 59 | Admitting: Professional

## 2024-03-31 ENCOUNTER — Other Ambulatory Visit: Payer: Self-pay | Admitting: Family Medicine

## 2024-03-31 DIAGNOSIS — Z6841 Body Mass Index (BMI) 40.0 and over, adult: Secondary | ICD-10-CM

## 2024-03-31 DIAGNOSIS — R7303 Prediabetes: Secondary | ICD-10-CM

## 2024-04-01 ENCOUNTER — Ambulatory Visit (INDEPENDENT_AMBULATORY_CARE_PROVIDER_SITE_OTHER): Admitting: Professional

## 2024-04-01 ENCOUNTER — Encounter: Payer: Self-pay | Admitting: Professional

## 2024-04-01 DIAGNOSIS — F4310 Post-traumatic stress disorder, unspecified: Secondary | ICD-10-CM | POA: Diagnosis not present

## 2024-04-01 DIAGNOSIS — F331 Major depressive disorder, recurrent, moderate: Secondary | ICD-10-CM | POA: Diagnosis not present
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
## 2024-04-15 ENCOUNTER — Encounter: Payer: Self-pay | Admitting: Professional

## 2024-04-15 ENCOUNTER — Ambulatory Visit (INDEPENDENT_AMBULATORY_CARE_PROVIDER_SITE_OTHER): Admitting: Professional

## 2024-04-15 DIAGNOSIS — Z62810 Personal history of physical and sexual abuse in childhood: Secondary | ICD-10-CM | POA: Diagnosis not present

## 2024-04-15 DIAGNOSIS — F4310 Post-traumatic stress disorder, unspecified: Secondary | ICD-10-CM

## 2024-04-15 DIAGNOSIS — F331 Major depressive disorder, recurrent, moderate: Secondary | ICD-10-CM | POA: Diagnosis not present
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
## 2024-04-16 ENCOUNTER — Encounter: Payer: 59 | Admitting: Family Medicine

## 2024-04-16 ENCOUNTER — Encounter: Payer: Self-pay | Admitting: Internal Medicine

## 2024-04-20 ENCOUNTER — Other Ambulatory Visit: Payer: Self-pay | Admitting: Family Medicine

## 2024-04-20 DIAGNOSIS — E782 Mixed hyperlipidemia: Secondary | ICD-10-CM

## 2024-04-29 ENCOUNTER — Ambulatory Visit (INDEPENDENT_AMBULATORY_CARE_PROVIDER_SITE_OTHER): Admitting: Professional

## 2024-04-29 ENCOUNTER — Encounter: Payer: Self-pay | Admitting: Professional

## 2024-04-29 DIAGNOSIS — F331 Major depressive disorder, recurrent, moderate: Secondary | ICD-10-CM

## 2024-04-29 DIAGNOSIS — F4310 Post-traumatic stress disorder, unspecified: Secondary | ICD-10-CM | POA: Diagnosis not present
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
## 2024-04-30 ENCOUNTER — Encounter: Payer: Self-pay | Admitting: Family Medicine

## 2024-04-30 ENCOUNTER — Encounter: Admitting: Family Medicine

## 2024-05-01 ENCOUNTER — Encounter: Payer: Self-pay | Admitting: Family Medicine

## 2024-05-01 NOTE — Progress Notes (Signed)
 Canceled appt by patient

## 2024-05-09 ENCOUNTER — Encounter: Admitting: Family Medicine

## 2024-05-13 ENCOUNTER — Ambulatory Visit (INDEPENDENT_AMBULATORY_CARE_PROVIDER_SITE_OTHER): Admitting: Professional

## 2024-05-13 DIAGNOSIS — F331 Major depressive disorder, recurrent, moderate: Secondary | ICD-10-CM

## 2024-05-13 DIAGNOSIS — F4310 Post-traumatic stress disorder, unspecified: Secondary | ICD-10-CM

## 2024-05-26 ENCOUNTER — Encounter: Payer: Self-pay | Admitting: Professional
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
## 2024-05-27 ENCOUNTER — Ambulatory Visit: Admitting: Professional

## 2024-06-10 ENCOUNTER — Encounter: Payer: Self-pay | Admitting: Professional

## 2024-06-10 ENCOUNTER — Ambulatory Visit: Admitting: Professional

## 2024-06-10 ENCOUNTER — Ambulatory Visit (INDEPENDENT_AMBULATORY_CARE_PROVIDER_SITE_OTHER): Admitting: Professional

## 2024-06-10 DIAGNOSIS — F4310 Post-traumatic stress disorder, unspecified: Secondary | ICD-10-CM | POA: Diagnosis not present

## 2024-06-10 DIAGNOSIS — F331 Major depressive disorder, recurrent, moderate: Secondary | ICD-10-CM | POA: Diagnosis not present
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
## 2024-06-11 ENCOUNTER — Ambulatory Visit: Admitting: Professional

## 2024-06-16 ENCOUNTER — Other Ambulatory Visit: Payer: Self-pay | Admitting: Family Medicine

## 2024-06-16 DIAGNOSIS — R7303 Prediabetes: Secondary | ICD-10-CM

## 2024-06-16 DIAGNOSIS — Z6841 Body Mass Index (BMI) 40.0 and over, adult: Secondary | ICD-10-CM

## 2024-06-25 ENCOUNTER — Ambulatory Visit (INDEPENDENT_AMBULATORY_CARE_PROVIDER_SITE_OTHER): Admitting: Professional

## 2024-06-25 ENCOUNTER — Encounter: Payer: Self-pay | Admitting: Professional

## 2024-06-25 DIAGNOSIS — F4310 Post-traumatic stress disorder, unspecified: Secondary | ICD-10-CM | POA: Diagnosis not present

## 2024-06-25 DIAGNOSIS — F331 Major depressive disorder, recurrent, moderate: Secondary | ICD-10-CM
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
## 2024-07-09 ENCOUNTER — Ambulatory Visit (INDEPENDENT_AMBULATORY_CARE_PROVIDER_SITE_OTHER): Admitting: Professional

## 2024-07-09 ENCOUNTER — Encounter: Payer: Self-pay | Admitting: Professional

## 2024-07-09 DIAGNOSIS — F4310 Post-traumatic stress disorder, unspecified: Secondary | ICD-10-CM | POA: Diagnosis not present

## 2024-07-09 DIAGNOSIS — F331 Major depressive disorder, recurrent, moderate: Secondary | ICD-10-CM | POA: Diagnosis not present
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
## 2024-07-23 ENCOUNTER — Ambulatory Visit: Admitting: Professional

## 2024-08-06 ENCOUNTER — Ambulatory Visit (INDEPENDENT_AMBULATORY_CARE_PROVIDER_SITE_OTHER): Admitting: Professional

## 2024-08-06 ENCOUNTER — Encounter: Payer: Self-pay | Admitting: Professional

## 2024-08-06 DIAGNOSIS — F4310 Post-traumatic stress disorder, unspecified: Secondary | ICD-10-CM | POA: Diagnosis not present

## 2024-08-06 DIAGNOSIS — F331 Major depressive disorder, recurrent, moderate: Secondary | ICD-10-CM | POA: Diagnosis not present

## 2024-08-06 NOTE — Progress Notes (Signed)
   Christina Mcbride, [facility]
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
## 2024-08-19 ENCOUNTER — Ambulatory Visit (INDEPENDENT_AMBULATORY_CARE_PROVIDER_SITE_OTHER): Admitting: Professional

## 2024-08-19 ENCOUNTER — Encounter: Payer: Self-pay | Admitting: Professional

## 2024-08-19 DIAGNOSIS — F331 Major depressive disorder, recurrent, moderate: Secondary | ICD-10-CM

## 2024-08-19 DIAGNOSIS — F4310 Post-traumatic stress disorder, unspecified: Secondary | ICD-10-CM
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
## 2024-09-03 ENCOUNTER — Ambulatory Visit: Admitting: Professional

## 2024-09-03 ENCOUNTER — Encounter: Payer: Self-pay | Admitting: Professional
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
## 2024-09-04 ENCOUNTER — Ambulatory Visit: Admitting: Professional

## 2024-09-04 ENCOUNTER — Encounter: Payer: Self-pay | Admitting: Professional

## 2024-09-04 DIAGNOSIS — F4310 Post-traumatic stress disorder, unspecified: Secondary | ICD-10-CM | POA: Diagnosis not present

## 2024-09-04 DIAGNOSIS — F331 Major depressive disorder, recurrent, moderate: Secondary | ICD-10-CM
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
## 2024-09-17 ENCOUNTER — Ambulatory Visit (INDEPENDENT_AMBULATORY_CARE_PROVIDER_SITE_OTHER): Admitting: Professional

## 2024-09-17 ENCOUNTER — Encounter: Payer: Self-pay | Admitting: Professional

## 2024-09-17 DIAGNOSIS — F331 Major depressive disorder, recurrent, moderate: Secondary | ICD-10-CM | POA: Diagnosis not present

## 2024-09-17 DIAGNOSIS — F4310 Post-traumatic stress disorder, unspecified: Secondary | ICD-10-CM | POA: Diagnosis not present

## 2024-09-17 DIAGNOSIS — Z62810 Personal history of physical and sexual abuse in childhood: Secondary | ICD-10-CM
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
## 2024-10-01 ENCOUNTER — Encounter: Payer: Self-pay | Admitting: Professional

## 2024-10-01 ENCOUNTER — Ambulatory Visit: Admitting: Professional

## 2024-10-01 DIAGNOSIS — F331 Major depressive disorder, recurrent, moderate: Secondary | ICD-10-CM

## 2024-10-01 NOTE — Progress Notes (Signed)
 Session began but patient has migraine and in unable to participate- no visit was conducted.

## 2024-10-08 ENCOUNTER — Ambulatory Visit
Admission: EM | Admit: 2024-10-08 | Discharge: 2024-10-08 | Disposition: A | Discharge status: Home / Self Care | Attending: Family Medicine | Admitting: Family Medicine

## 2024-10-08 ENCOUNTER — Ambulatory Visit (HOSPITAL_COMMUNITY)

## 2024-10-08 ENCOUNTER — Ambulatory Visit (INDEPENDENT_AMBULATORY_CARE_PROVIDER_SITE_OTHER)

## 2024-10-08 ENCOUNTER — Ambulatory Visit: Payer: Self-pay | Admitting: Urgent Care

## 2024-10-08 DIAGNOSIS — S86912A Strain of unspecified muscle(s) and tendon(s) at lower leg level, left leg, initial encounter: Secondary | ICD-10-CM

## 2024-10-08 DIAGNOSIS — M25562 Pain in left knee: Secondary | ICD-10-CM

## 2024-10-08 MED ORDER — NAPROXEN 500 MG PO TABS: 500.0000 mg | ORAL_TABLET | Freq: Two times a day (BID) | ORAL | 0 refills | Status: DC

## 2024-10-08 NOTE — ED Triage Notes (Signed)
 Pt c/o left knee pain after feeling a loud pop to posterior knee 2 days ago-taking naproxen  with some relief-NAD-limping gait

## 2024-10-08 NOTE — ED Provider Notes (Signed)
 [facility]  Note:  This document was prepared using Conservation officer, historic buildings and may include unintentional dictation errors.  MRN: [id number] DOB: 09-15-1971  Subjective:   Christina Mcbride is a 53 y.o. female presenting for 2-day history of persistent left knee pain after feeling a loud pop.  Patient reports that she had tried to take a step back in was simply bracing herself against a door when she felt a sudden pop and subsequent pain.  No fall, trauma, swelling, bruising.  She has had significant difficulty bending the knee.  She is able to walk but with significant pain most of which is over the back of the knee.  No current facility-administered medications for this encounter.  Current Outpatient Medications:    atorvastatin  (LIPITOR) 20 MG tablet, TAKE 1 TABLET(20 MG) BY MOUTH DAILY, Disp: 90 tablet, Rfl: 1   butalbital -acetaminophen -caffeine  (FIORICET ) 50-325-40 MG tablet, Take 1-2 tablets by mouth every 6 (six) hours as needed for headache., Disp: 20 tablet, Rfl: 0   escitalopram  (LEXAPRO ) 10 MG tablet, TAKE 1 TABLET(10 MG) BY MOUTH AT BEDTIME, Disp: 90 tablet, Rfl: 1   ferrous sulfate  325 (65 FE) MG tablet, Take 1 tablet (325 mg total) by mouth 2 (two) times daily with a meal., Disp: 180 tablet, Rfl: 1   methocarbamol  (ROBAXIN ) 500 MG tablet, Take 1 tablet (500 mg total) by mouth 2 (two) times daily as needed for muscle spasms., Disp: 20 tablet, Rfl: 0   Multiple Vitamin (MULTIVITAMIN ADULT) TABS, Take 2 tablets by mouth daily., Disp: , Rfl:    naproxen  (NAPROSYN ) 500 MG tablet, Take 1 tablet (500 mg total) by mouth 2 (two) times daily., Disp: 30 tablet, Rfl: 0   ondansetron  (ZOFRAN ) 8 MG tablet, Take 1 tablet (8 mg total) by mouth every 8 (eight) hours as needed for nausea or vomiting., Disp: 20 tablet, Rfl: 0   Semaglutide , 1 MG/DOSE, (OZEMPIC , 1 MG/DOSE,) 4 MG/3ML SOPN, INJCT 1 MG UNDER THE SKIN ONCE A WEEK AS DIRECTED, Disp: 9 mL, Rfl: 0    Allergies  Allergen Reactions   Bee Venom Anaphylaxis   Cat Dander     Past Medical History:  Diagnosis Date   Amenia 11/2018   Blood transfusion without reported diagnosis    COVID-19 virus infection - May 2021 04/15/2020   Migraine    Orthostatic hypotension    PTSD (post-traumatic stress disorder)    reports daughter was murdered    Tachycardia    Thyroid  disease    Vitamin D  deficiency      Past Surgical History:  Procedure Laterality Date   BIOPSY  10/17/2019   Procedure: BIOPSY;  Surgeon: Legrand Victory LITTIE DOUGLAS, MD;  Location: [facility] ENDOSCOPY;  Service: Gastroenterology;;   COLONOSCOPY WITH PROPOFOL  N/A 10/17/2019   Procedure: COLONOSCOPY WITH PROPOFOL ;  Surgeon: Legrand Victory LITTIE DOUGLAS, MD;  Location: [facility] ENDOSCOPY;  Service: Gastroenterology;  Laterality: N/A;   ESOPHAGOGASTRODUODENOSCOPY (EGD) WITH PROPOFOL  N/A 10/17/2019   Procedure: ESOPHAGOGASTRODUODENOSCOPY (EGD) WITH PROPOFOL ;  Surgeon: Legrand Victory LITTIE DOUGLAS, MD;  Location: [facility] ENDOSCOPY;  Service: Gastroenterology;  Laterality: N/A;   TUBAL LIGATION      Family History  Problem Relation Age of Onset   Hypertension Mother    Arthritis Mother    Alzheimer's disease Mother    Diabetes Maternal Grandmother    Hypertension Son    Cancer Maternal Aunt     Social History   Tobacco Use   Smoking status: Never  Passive exposure: Never   Smokeless tobacco: Never  Vaping Use   Vaping status: Never Used  Substance Use Topics   Alcohol use: No   Drug use: No    ROS   Objective:   Vitals: BP (!) 140/91 (BP Location: Left Arm)   Pulse 88   Temp 98.8 F (37.1 C) (Oral)   Resp 16   LMP 05/04/2016   SpO2 97%   Physical Exam Constitutional:      General: She is not in acute distress.    Appearance: Normal appearance. She is well-developed. She is not ill-appearing, toxic-appearing or diaphoretic.  HENT:     Head: Normocephalic and atraumatic.     Nose: Nose normal.     Mouth/Throat:     Mouth: Mucous  membranes are moist.  Eyes:     General: No scleral icterus.       Right eye: No discharge.        Left eye: No discharge.     Extraocular Movements: Extraocular movements intact.  Cardiovascular:     Rate and Rhythm: Normal rate.  Pulmonary:     Effort: Pulmonary effort is normal.  Musculoskeletal:     Left knee: No swelling, deformity, effusion, erythema, ecchymosis, lacerations, bony tenderness or crepitus. Decreased range of motion. Tenderness (posteriorly) present over the medial joint line. No lateral joint line or patellar tendon tenderness. Normal alignment and normal patellar mobility.  Skin:    General: Skin is warm and dry.  Neurological:     General: No focal deficit present.     Mental Status: She is alert and oriented to person, place, and time.  Psychiatric:        Mood and Affect: Mood normal.        Behavior: Behavior normal.    Applied a 4 inch Ace wrap to the left knee.  Assessment and Plan :   PDMP not reviewed this encounter.  1. Acute pain of left knee   2. Knee strain, left, initial encounter    Suspect the patient strained her knee.  Recommended RICE method, naproxen  for pain and inflammation.  Follow-up with her orthopedist for further imaging to rule out popliteal cyst, ligament or meniscus injury.  Overread pending.  Counseled patient on potential for adverse effects with medications prescribed/recommended today, ER and return-to-clinic precautions discussed, patient verbalized understanding.    Christopher Savannah, PA-C 10/08/24 [id number]

## 2024-10-08 NOTE — Discharge Instructions (Signed)
 Will let you know your x-ray results later today. For now you can use naproxen  for pain and inflammation to help with your suspected knee strain. Follow up with Dr. Jari office asap. Use icing 20 minutes on, 2 hours off. Apply ace wrap during the day.

## 2024-10-15 ENCOUNTER — Encounter: Payer: Self-pay | Admitting: Professional

## 2024-10-15 ENCOUNTER — Ambulatory Visit: Admitting: Professional

## 2024-10-15 DIAGNOSIS — F4310 Post-traumatic stress disorder, unspecified: Secondary | ICD-10-CM | POA: Diagnosis not present

## 2024-10-15 DIAGNOSIS — F331 Major depressive disorder, recurrent, moderate: Secondary | ICD-10-CM | POA: Diagnosis not present
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
## 2024-10-29 ENCOUNTER — Ambulatory Visit: Admitting: Professional

## 2024-10-29 ENCOUNTER — Encounter: Payer: Self-pay | Admitting: Professional

## 2024-10-29 DIAGNOSIS — F4310 Post-traumatic stress disorder, unspecified: Secondary | ICD-10-CM

## 2024-10-29 DIAGNOSIS — F331 Major depressive disorder, recurrent, moderate: Secondary | ICD-10-CM
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
## 2024-10-31 ENCOUNTER — Other Ambulatory Visit: Payer: Self-pay

## 2024-10-31 ENCOUNTER — Emergency Department (HOSPITAL_COMMUNITY)

## 2024-10-31 ENCOUNTER — Emergency Department (HOSPITAL_COMMUNITY)
Admission: EM | Admit: 2024-10-31 | Discharge: 2024-10-31 | Disposition: A | Discharge status: Home / Self Care | Attending: Emergency Medicine | Admitting: Emergency Medicine

## 2024-10-31 DIAGNOSIS — R10A1 Flank pain, right side: Secondary | ICD-10-CM | POA: Insufficient documentation

## 2024-10-31 DIAGNOSIS — Z8616 Personal history of COVID-19: Secondary | ICD-10-CM | POA: Insufficient documentation

## 2024-10-31 DIAGNOSIS — R1031 Right lower quadrant pain: Secondary | ICD-10-CM | POA: Insufficient documentation

## 2024-10-31 LAB — BASIC METABOLIC PANEL WITH GFR
Anion gap: 10 (ref 5–15)
BUN: 17 mg/dL (ref 6–20)
CO2: 27 mmol/L (ref 22–32)
Calcium: 9.3 mg/dL (ref 8.9–10.3)
Chloride: 105 mmol/L (ref 98–111)
Creatinine, Ser: 0.96 mg/dL (ref 0.44–1.00)
GFR, Estimated: >60 mL/min (ref >60)
Glucose, Bld: 113 mg/dL — ABNORMAL HIGH (ref 70–99)
Potassium: 4.4 mmol/L (ref 3.5–5.1)
Sodium: 141 mmol/L (ref 135–145)

## 2024-10-31 LAB — URINALYSIS, ROUTINE W REFLEX MICROSCOPIC
Bilirubin Urine: NEGATIVE
Glucose, UA: NEGATIVE mg/dL
Ketones, ur: NEGATIVE mg/dL
Leukocytes,Ua: NEGATIVE
Nitrite: NEGATIVE
Protein, ur: NEGATIVE mg/dL
Specific Gravity, Urine: 1.014 (ref 1.005–1.030)
pH: 5 (ref 5.0–8.0)

## 2024-10-31 LAB — CBC
HCT: 43.6 % (ref 36.0–46.0)
Hemoglobin: 13.1 g/dL (ref 12.0–15.0)
MCH: 22.6 pg — ABNORMAL LOW (ref 26.0–34.0)
MCHC: 30 g/dL (ref 30.0–36.0)
MCV: 75.2 fL — ABNORMAL LOW (ref 80.0–100.0)
Platelets: 471 K/uL — ABNORMAL HIGH (ref 150–400)
RBC: 5.8 MIL/uL — ABNORMAL HIGH (ref 3.87–5.11)
RDW: 18.1 % — ABNORMAL HIGH (ref 11.5–15.5)
WBC: 8 K/uL (ref 4.0–10.5)
nRBC: 0 % (ref 0.0–0.2)

## 2024-10-31 MED ORDER — KETOROLAC TROMETHAMINE 30 MG/ML IJ SOLN
15.0000 mg | Freq: Once | INTRAMUSCULAR | Status: AC
  Administered 2024-10-31: 15 mg via INTRAVENOUS
  Filled 2024-10-31: qty 1

## 2024-10-31 MED ORDER — IOHEXOL 300 MG/ML  SOLN
100.0000 mL | Freq: Once | INTRAMUSCULAR | Status: AC | PRN
  Administered 2024-10-31: 100 mL via INTRAVENOUS

## 2024-10-31 NOTE — Discharge Instructions (Signed)
 We have evaluated you for your right side/flank pain and abdominal pain.  Your testing was overall reassuring.  We did not see signs of a urine infection.  We obtained a CT scan which did not show any abnormalities of your kidneys or abdomen.  It is possible that you passed a small kidney stone.  Your symptoms could also be due to a muscular strain or sprain.  Since your blood tests and urine test as well as your CT scan were reassuring, we feel that is safe to go home.  Please take Tylenol  (acetaminophen ) and Motrin  (ibuprofen ) for your symptoms at home.  You can take 1000 mg of Tylenol  every 6 hours and 600 mg of Motrin  every 6 hours as needed for your symptoms.  You can take these medicines together as needed, either at the same time, or alternating every 3 hours.  As we did not know the exact cause of your symptoms, it is very important to follow-up with your primary physician.  If you have any new or worsening symptoms such as worsening pain, fevers or chills, vomiting, or any other new symptoms, please return to the emergency department.
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
## 2024-10-31 NOTE — ED Triage Notes (Signed)
 PT ambulatory to triage with complaints of RLQ abdominal pain that moves to her back. PT states that the pain began suddenly around 2AM and woke her from her sleep. Pt endorses increased urinary frequency.

## 2024-11-12 ENCOUNTER — Ambulatory Visit: Admitting: Professional

## 2024-11-22 NOTE — ED Triage Notes (Signed)
 Pt ambulatory to triage with CC/ productive cough with light yellow sputum lasting over 2 weeks. Pt reports other symptoms have resolved but the cough.  Pt endorses low grade fevers of 99-100.1 F. Pt reports taking multiple over the counter medications with little to no relief.

## 2024-11-22 NOTE — Unmapped External Note (Signed)
 Emergency Department Elopement Provider Review Note  [facility] Emergency Encounter Date: 11/22/24  Chief Complaint  Patient presents with   Cough   Christina Mcbride has left [facility] Emergency Department on their own free will, without waiting to complete a Medical Screening Examination.       Elopement Nursing Documentation: Patient Informed Staff They Were Leaving : Has patient informed clinical staff that they were leaving- Yes Refusal of MSE Treat or Transfer Form Check : Has a refusal to consent to MSE Treat or Transfer Form Completed- Yes  ED Encounter Orders: Orders Placed This Encounter  Procedures   SARS-CoV-2/Flu/RSV, PCR (Cepheid) - ED Admissions    Standing Status:   Standing    Number of Occurrences:   1   XR chest 2 views    Standing Status:   Standing    Number of Occurrences:   1    Is the patient pregnant?:   No    Reason for exam::   cough    ED Labs/Imaging: SARS-CoV-2/Flu/RSV, PCR (Cepheid) - ED Admissions           Component Ref Range & Units Value   SARS-CoV-2, PCR   Negative Negative   Influenza A, PCR   Negative Negative   Influenza B, PCR   Negative Negative   RSV, PCR   Negative Negative      XR chest 2 views         2 view chest x-ray  Comparison: None provided  Findings: The lungs are clear. Normal size heart. No acute fracture.  IMPRESSION: 1. No acute findings.  This document has been electronically signed by [facility]: Real Balint, MD on 11/22/2024 20:12:44      Review Notes: Eloped ED Patient Follow-up: Patient follow-up with PCP/Discharge Clinic required  [facility], DO

## 2024-11-24 ENCOUNTER — Other Ambulatory Visit: Payer: Self-pay

## 2024-11-24 ENCOUNTER — Inpatient Hospital Stay (HOSPITAL_COMMUNITY)
Admission: AD | Admit: 2024-11-24 | Discharge: 2024-11-30 | DRG: 885 | Disposition: A | Discharge status: Home / Self Care | Source: Intra-hospital | Attending: Student in an Organized Health Care Education/Training Program | Admitting: Student in an Organized Health Care Education/Training Program

## 2024-11-24 ENCOUNTER — Emergency Department (HOSPITAL_COMMUNITY)
Admission: EM | Admit: 2024-11-24 | Discharge: 2024-11-24 | Disposition: A | Discharge status: Other Acute Inpatient Hospital | Source: Home / Self Care | Attending: Emergency Medicine | Admitting: Emergency Medicine

## 2024-11-24 ENCOUNTER — Encounter (HOSPITAL_COMMUNITY): Payer: Self-pay | Admitting: Psychiatry

## 2024-11-24 ENCOUNTER — Encounter (HOSPITAL_COMMUNITY): Payer: Self-pay

## 2024-11-24 DIAGNOSIS — Z9851 Tubal ligation status: Secondary | ICD-10-CM

## 2024-11-24 DIAGNOSIS — Z9151 Personal history of suicidal behavior: Secondary | ICD-10-CM

## 2024-11-24 DIAGNOSIS — Z8616 Personal history of COVID-19: Secondary | ICD-10-CM | POA: Diagnosis not present

## 2024-11-24 DIAGNOSIS — F419 Anxiety disorder, unspecified: Secondary | ICD-10-CM | POA: Insufficient documentation

## 2024-11-24 DIAGNOSIS — F4310 Post-traumatic stress disorder, unspecified: Secondary | ICD-10-CM | POA: Diagnosis not present

## 2024-11-24 DIAGNOSIS — D649 Anemia, unspecified: Secondary | ICD-10-CM | POA: Insufficient documentation

## 2024-11-24 DIAGNOSIS — Z91148 Patient's other noncompliance with medication regimen for other reason: Secondary | ICD-10-CM

## 2024-11-24 DIAGNOSIS — E669 Obesity, unspecified: Secondary | ICD-10-CM | POA: Diagnosis present

## 2024-11-24 DIAGNOSIS — Z5941 Food insecurity: Secondary | ICD-10-CM

## 2024-11-24 DIAGNOSIS — Z79899 Other long term (current) drug therapy: Secondary | ICD-10-CM

## 2024-11-24 DIAGNOSIS — I10 Essential (primary) hypertension: Secondary | ICD-10-CM | POA: Diagnosis present

## 2024-11-24 DIAGNOSIS — Z9152 Personal history of nonsuicidal self-harm: Secondary | ICD-10-CM

## 2024-11-24 DIAGNOSIS — Z91030 Bee allergy status: Secondary | ICD-10-CM | POA: Diagnosis not present

## 2024-11-24 DIAGNOSIS — F411 Generalized anxiety disorder: Secondary | ICD-10-CM | POA: Diagnosis present

## 2024-11-24 DIAGNOSIS — F322 Major depressive disorder, single episode, severe without psychotic features: Secondary | ICD-10-CM

## 2024-11-24 DIAGNOSIS — Z820 Family history of epilepsy and other diseases of the nervous system: Secondary | ICD-10-CM | POA: Diagnosis not present

## 2024-11-24 DIAGNOSIS — Z833 Family history of diabetes mellitus: Secondary | ICD-10-CM

## 2024-11-24 DIAGNOSIS — R45851 Suicidal ideations: Secondary | ICD-10-CM

## 2024-11-24 DIAGNOSIS — Z8249 Family history of ischemic heart disease and other diseases of the circulatory system: Secondary | ICD-10-CM

## 2024-11-24 DIAGNOSIS — Z8261 Family history of arthritis: Secondary | ICD-10-CM

## 2024-11-24 DIAGNOSIS — Z5982 Transportation insecurity: Secondary | ICD-10-CM | POA: Diagnosis not present

## 2024-11-24 DIAGNOSIS — Z6841 Body Mass Index (BMI) 40.0 and over, adult: Secondary | ICD-10-CM

## 2024-11-24 DIAGNOSIS — F332 Major depressive disorder, recurrent severe without psychotic features: Principal | ICD-10-CM | POA: Diagnosis present

## 2024-11-24 DIAGNOSIS — F323 Major depressive disorder, single episode, severe with psychotic features: Secondary | ICD-10-CM | POA: Insufficient documentation

## 2024-11-24 DIAGNOSIS — Y900 Blood alcohol level of less than 20 mg/100 ml: Secondary | ICD-10-CM | POA: Insufficient documentation

## 2024-11-24 DIAGNOSIS — Z7985 Long-term (current) use of injectable non-insulin antidiabetic drugs: Secondary | ICD-10-CM | POA: Diagnosis not present

## 2024-11-24 HISTORY — DX: Personal history of other diseases of the digestive system: Z87.19

## 2024-11-24 LAB — CBC WITH DIFFERENTIAL/PLATELET
Abs Immature Granulocytes: 0.12 K/uL — ABNORMAL HIGH (ref 0.00–0.07)
Basophils Absolute: 0 K/uL (ref 0.0–0.1)
Basophils Relative: 1 %
Eosinophils Absolute: 0.1 K/uL (ref 0.0–0.5)
Eosinophils Relative: 1 %
HCT: 36.7 % (ref 36.0–46.0)
Hemoglobin: 10.9 g/dL — ABNORMAL LOW (ref 12.0–15.0)
Immature Granulocytes: 2 %
Lymphocytes Relative: 23 %
Lymphs Abs: 1.7 K/uL (ref 0.7–4.0)
MCH: 22.3 pg — ABNORMAL LOW (ref 26.0–34.0)
MCHC: 29.7 g/dL — ABNORMAL LOW (ref 30.0–36.0)
MCV: 75.1 fL — ABNORMAL LOW (ref 80.0–100.0)
Monocytes Absolute: 0.7 K/uL (ref 0.1–1.0)
Monocytes Relative: 9 %
Neutro Abs: 4.9 K/uL (ref 1.7–7.7)
Neutrophils Relative %: 64 %
Platelets: 504 K/uL — ABNORMAL HIGH (ref 150–400)
RBC: 4.89 MIL/uL (ref 3.87–5.11)
RDW: 17 % — ABNORMAL HIGH (ref 11.5–15.5)
WBC: 7.5 K/uL (ref 4.0–10.5)
nRBC: 0 % (ref 0.0–0.2)

## 2024-11-24 LAB — COMPREHENSIVE METABOLIC PANEL WITH GFR
ALT: 16 U/L (ref 0–44)
AST: 14 U/L — ABNORMAL LOW (ref 15–41)
Albumin: 3.7 g/dL (ref 3.5–5.0)
Alkaline Phosphatase: 85 U/L (ref 38–126)
Anion gap: 9 (ref 5–15)
BUN: 11 mg/dL (ref 6–20)
CO2: 26 mmol/L (ref 22–32)
Calcium: 9.1 mg/dL (ref 8.9–10.3)
Chloride: 108 mmol/L (ref 98–111)
Creatinine, Ser: 0.94 mg/dL (ref 0.44–1.00)
GFR, Estimated: >60 mL/min
Glucose, Bld: 97 mg/dL (ref 70–99)
Potassium: 4.2 mmol/L (ref 3.5–5.1)
Sodium: 144 mmol/L (ref 135–145)
Total Bilirubin: 0.5 mg/dL (ref 0.0–1.2)
Total Protein: 7.1 g/dL (ref 6.5–8.1)

## 2024-11-24 LAB — URINE DRUG SCREEN
Amphetamines: NEGATIVE
Barbiturates: NEGATIVE
Benzodiazepines: NEGATIVE
Cocaine: NEGATIVE
Fentanyl: NEGATIVE
Methadone Scn, Ur: NEGATIVE
Opiates: NEGATIVE
Tetrahydrocannabinol: NEGATIVE

## 2024-11-24 LAB — ACETAMINOPHEN LEVEL: Acetaminophen (Tylenol), Serum: <10 ug/mL — ABNORMAL LOW (ref 10–30)

## 2024-11-24 LAB — ETHANOL: Alcohol, Ethyl (B): <15 mg/dL

## 2024-11-24 LAB — SALICYLATE LEVEL: Salicylate Lvl: <7 mg/dL — ABNORMAL LOW (ref 7.0–30.0)

## 2024-11-24 MED ORDER — HYDROXYZINE HCL 25 MG PO TABS
25.0000 mg | ORAL_TABLET | Freq: Three times a day (TID) | ORAL | Status: DC | PRN
  Administered 2024-11-24 – 2024-11-27 (×6): 25 mg via ORAL
  Filled 2024-11-24 (×6): qty 1

## 2024-11-24 MED ORDER — BUTALBITAL-APAP-CAFFEINE 50-325-40 MG PO TABS: 1.0000 | ORAL_TABLET | Freq: Four times a day (QID) | ORAL | Status: AC | PRN

## 2024-11-24 MED ORDER — HALOPERIDOL 5 MG PO TABS: 5.0000 mg | ORAL_TABLET | Freq: Three times a day (TID) | ORAL | Status: DC | PRN

## 2024-11-24 MED ORDER — LORAZEPAM 2 MG/ML IJ SOLN: 2.0000 mg | Freq: Three times a day (TID) | INTRAMUSCULAR | Status: AC | PRN

## 2024-11-24 MED ORDER — HALOPERIDOL LACTATE 5 MG/ML IJ SOLN: 10.0000 mg | Freq: Three times a day (TID) | INTRAMUSCULAR | Status: DC | PRN

## 2024-11-24 MED ORDER — HYDROXYZINE HCL 25 MG PO TABS
ORAL_TABLET | ORAL | Status: AC
  Filled 2024-11-24: qty 1

## 2024-11-24 MED ORDER — DIPHENHYDRAMINE HCL 50 MG/ML IJ SOLN: 50.0000 mg | Freq: Three times a day (TID) | INTRAMUSCULAR | Status: DC | PRN

## 2024-11-24 MED ORDER — ALUM & MAG HYDROXIDE-SIMETH 200-200-20 MG/5ML PO SUSP: 30.0000 mL | ORAL | Status: DC | PRN

## 2024-11-24 MED ORDER — ESCITALOPRAM OXALATE 10 MG PO TABS
10.0000 mg | ORAL_TABLET | Freq: Every day | ORAL | Status: DC
  Filled 2024-11-24: qty 1

## 2024-11-24 MED ORDER — MAGNESIUM HYDROXIDE 400 MG/5ML PO SUSP: 30.0000 mL | Freq: Every day | ORAL | Status: DC | PRN

## 2024-11-24 MED ORDER — ATORVASTATIN CALCIUM 10 MG PO TABS
20.0000 mg | ORAL_TABLET | Freq: Every day | ORAL | Status: DC
  Administered 2024-11-24 – 2024-11-30 (×7): 20 mg via ORAL
  Filled 2024-11-24 (×8): qty 2

## 2024-11-24 MED ORDER — HALOPERIDOL LACTATE 5 MG/ML IJ SOLN: 5.0000 mg | Freq: Three times a day (TID) | INTRAMUSCULAR | Status: DC | PRN

## 2024-11-24 MED ORDER — ACETAMINOPHEN 325 MG PO TABS
650.0000 mg | ORAL_TABLET | Freq: Four times a day (QID) | ORAL | Status: DC | PRN
  Administered 2024-11-25 – 2024-11-26 (×2): 650 mg via ORAL
  Filled 2024-11-24 (×2): qty 2

## 2024-11-24 MED ORDER — DIPHENHYDRAMINE HCL 25 MG PO CAPS: 50.0000 mg | ORAL_CAPSULE | Freq: Three times a day (TID) | ORAL | Status: DC | PRN

## 2024-11-24 MED ORDER — HYDROXYZINE HCL 25 MG PO TABS: 25.0000 mg | ORAL_TABLET | Freq: Three times a day (TID) | ORAL | Status: DC | PRN

## 2024-11-24 MED ORDER — ESCITALOPRAM OXALATE 10 MG PO TABS: 10.0000 mg | ORAL_TABLET | Freq: Every day | ORAL | Status: DC

## 2024-11-24 NOTE — ED Provider Notes (Signed)
 " [place] EMERGENCY DEPARTMENT AT [facility] Provider Note   CSN: [id number] Arrival date & time: 11/24/24  0034     Patient presents with: Suicidal   Christina Mcbride is a 53 y.o. female with history of PTSD who is post to be on Lexapro  but has not taken it for a few days who presents with concern for SI and feeling that her family would be better off without me.  She will not elaborate on a plan of self-harm but does have a history of self-harm in the past, no HI or AVH at this time.  Tearful.  HPI     Prior to Admission medications  Medication Sig Start Date End Date Taking? Authorizing Provider  atorvastatin  (LIPITOR) 20 MG tablet TAKE 1 TABLET(20 MG) BY MOUTH DAILY 04/22/24   Colette Torrence GRADE, MD  butalbital -acetaminophen -caffeine  (FIORICET ) 50-325-40 MG tablet Take 1-2 tablets by mouth every 6 (six) hours as needed for headache. 12/06/23 12/05/24  Maranda Jamee Jacob, MD  escitalopram  (LEXAPRO ) 10 MG tablet TAKE 1 TABLET(10 MG) BY MOUTH AT BEDTIME 03/10/24   Colette Torrence GRADE, MD  ferrous sulfate  325 (65 FE) MG tablet Take 1 tablet (325 mg total) by mouth 2 (two) times daily with a meal. 08/29/23   Rucker, Torrence GRADE, MD  methocarbamol  (ROBAXIN ) 500 MG tablet Take 1 tablet (500 mg total) by mouth 2 (two) times daily as needed for muscle spasms. 03/05/24   Towana Ozell BROCKS, MD  Multiple Vitamin (MULTIVITAMIN ADULT) TABS Take 2 tablets by mouth daily.    [provider]  naproxen  (NAPROSYN ) 500 MG tablet Take 1 tablet (500 mg total) by mouth 2 (two) times daily with a meal. 10/08/24   Christopher Savannah, PA-C  ondansetron  (ZOFRAN ) 8 MG tablet Take 1 tablet (8 mg total) by mouth every 8 (eight) hours as needed for nausea or vomiting. 12/06/23   Maranda Jamee Jacob, MD  Semaglutide , 1 MG/DOSE, (OZEMPIC , 1 MG/DOSE,) 4 MG/3ML SOPN INJCT 1 MG UNDER THE SKIN ONCE A WEEK AS DIRECTED 06/16/24   Colette Torrence GRADE, MD    Allergies: Bee venom and Cat dander    Review of Systems   Psychiatric/Behavioral:  Positive for suicidal ideas. The patient is nervous/anxious.     Updated Vital Signs BP (!) 157/115 (BP Location: Right Arm)   Pulse (!) 117   Temp 97.9 F (36.6 C) (Oral)   Resp 18   LMP 05/04/2016   SpO2 100%   Physical Exam Vitals and nursing note reviewed.  Constitutional:      Appearance: She is not ill-appearing or toxic-appearing.  HENT:     Head: Normocephalic and atraumatic.     Mouth/Throat:     Mouth: Mucous membranes are moist.     Pharynx: No oropharyngeal exudate or posterior oropharyngeal erythema.  Eyes:     General:        Right eye: No discharge.        Left eye: No discharge.     Conjunctiva/sclera: Conjunctivae normal.  Cardiovascular:     Rate and Rhythm: Normal rate and regular rhythm.     Pulses: Normal pulses.     Heart sounds: Normal heart sounds. No murmur heard. Pulmonary:     Effort: Pulmonary effort is normal. No respiratory distress.     Breath sounds: Normal breath sounds. No wheezing or rales.  Abdominal:     General: Bowel sounds are normal. There is no distension.     Palpations: Abdomen is soft.  Tenderness: There is no abdominal tenderness. There is no guarding or rebound.  Musculoskeletal:        General: No deformity.     Cervical back: Neck supple.  Skin:    General: Skin is warm and dry.     Capillary Refill: Capillary refill takes less than 2 seconds.  Neurological:     Mental Status: She is alert and oriented to person, place, and time. Mental status is at baseline.     GCS: GCS eye subscore is 4. GCS verbal subscore is 5. GCS motor subscore is 6.     Gait: Gait is intact.  Psychiatric:        Mood and Affect: Mood is anxious. Affect is flat and tearful.        Behavior: Behavior normal.        Thought Content: Thought content includes suicidal ideation. Thought content does not include homicidal ideation. Thought content does not include suicidal plan.     Comments: Does not appear to be  responding to internal stimuli.     (all labs ordered are listed, but only abnormal results are displayed) Labs Reviewed  COMPREHENSIVE METABOLIC PANEL WITH GFR  ETHANOL  URINE DRUG SCREEN  CBC WITH DIFFERENTIAL/PLATELET  ACETAMINOPHEN  LEVEL  SALICYLATE LEVEL    EKG: None  Radiology: No results found.   Procedures   Medications Ordered in the ED - No data to display                                  Medical Decision Making 53 year old female with SI.  Tachycardic and tearful at time of evaluation, hypertensive.  Tachycardia resolved at time of my evaluation.  Cardiopulmonary abdominal exams are benign.  Exam as above.  Amount and/or Complexity of Data Reviewed Labs: ordered.    Details: CBC with anemia with hemoglobin 10.9, CMP unremarkable, salicylate alcohol and acetaminophen  levels are normal, UDS negative. ECG/medicine tests:     Details:   EKG sinus rhythm sinus arrhythmia.   Patient is medically cleared at this time pending TTS evaluation. Care of this patient signed out to oncoming ED provider  at time of shift change. All pertinent HPI, physical exam, and laboratory findings were discussed with them prior to my departure. Disposition of patient pending completion of workup, reevaluation, and clinical judgement of oncoming ED provider.  Pending psych eval at time of shift change.   This chart was dictated using voice recognition software, Dragon. Despite the best efforts of this provider to proofread and correct errors, errors may still occur which can change documentation meaning.       Final diagnoses:  None    ED Discharge Orders     None          Bobette Pleasant JONELLE DEVONNA 11/24/24 0612    Emil Share, DO 11/24/24 [id number]  "

## 2024-11-24 NOTE — Progress Notes (Signed)
 Pt has been accepted to [facility] on 11/24/2024 Bed assignment: 404-02  Pt meets inpatient criteria per: Cathaleen Jacobson NP  Attending Physician will be: Leita Arts MD  Report can be called to:  Adult unit: [phone]  Pt can arrive after: [facility] WILL UPDATE   Care Team Notified: [facility] Danika Carlo RN, Cathaleen Mangrum NP   [place] Emylia Latella LCSW-A   11/24/2024 1:48 PM
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
## 2024-11-24 NOTE — ED Triage Notes (Signed)
 Very tearful in triage.   Says she feels like her family would be better off without her.   Hx of self  harm.
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
## 2024-11-24 NOTE — Group Note (Signed)
 Date:  11/24/2024 Time:  4:42 PM  Group Topic/Focus: Sleep  Occupational therapists address sleep with adult mental health patients as a core daily occupation that significantly affects emotional regulation, cognitive functioning, and engagement in meaningful activities. From an OT perspective, sleep difficulties are explored holistically by considering routines, environments, daytime activity levels, and the impact of mental health symptoms such as anxiety, low mood, or racing thoughts. Interventions focus on practical, person-centred strategies including establishing consistent sleep and wake routines, promoting balanced daytime activity, adapting the sleep environment, and supporting relaxation or sensory regulation techniques. Occupational therapists work collaboratively with the multidisciplinary team and use trauma-informed, flexible approaches to help individuals develop sustainable sleep habits that support recovery and overall occupational performance.    Participation Level:  Did Not Attend   Christina Mcbride 11/24/2024, 4:42 PM

## 2024-11-24 NOTE — Progress Notes (Signed)
(  Sleep Hours) -10 (Any PRNs that were needed, meds refused, or side effects to meds)- none (Any disturbances and when (visitation, over night)-none (Concerns raised by the patient)- none (SI/HI/AVH)- passive SI

## 2024-11-24 NOTE — Plan of Care (Signed)
   Problem: Education: Goal: Knowledge of [facility] Education information/materials will improve Outcome: Progressing Goal: Verbalization of understanding the information provided will improve Outcome: Progressing

## 2024-11-24 NOTE — ED Notes (Addendum)
 Patient was dressed out in purple scrubs  Patient has 3 bags of belongings, including iphone in a blue case  Patients belongings are located in the cabinet on top shelf at nurses station  Patient was wanded by security

## 2024-11-24 NOTE — Group Note (Signed)
 Date:  11/24/2024 Time:  8:29 PM  Group Topic/Focus:  Wrap-Up Group:   The focus of this group is to help patients review their daily goal of treatment and discuss progress on daily workbooks.  Alcoholics Anonymous (AA) Meeting    Participation Level:  Did Not Attend  Participation Quality:  N/A  Affect:  N/A  Cognitive:  N/A  Insight: None  Engagement in Group:  N/A  Modes of Intervention:  N/A  Additional Comments:  Patient did not attend  Eward Mace 11/24/2024, 8:29 PM

## 2024-11-24 NOTE — ED Notes (Signed)
 Report called and given to Juliene, RN at [facility].

## 2024-11-24 NOTE — BH Assessment (Signed)
 TTS Consult will be completed by IRIS. IRIS Coordinator will communicate in secure chat assessment time and provider name. Thanks

## 2024-11-25 MED ORDER — ARIPIPRAZOLE 2 MG PO TABS
2.0000 mg | ORAL_TABLET | Freq: Every day | ORAL | Status: DC
  Administered 2024-11-25 – 2024-11-30 (×6): 2 mg via ORAL
  Filled 2024-11-25 (×6): qty 1

## 2024-11-25 MED ORDER — TRAZODONE HCL 50 MG PO TABS
50.0000 mg | ORAL_TABLET | Freq: Every evening | ORAL | Status: DC | PRN
  Administered 2024-11-25 – 2024-11-27 (×3): 50 mg via ORAL
  Filled 2024-11-25 (×4): qty 1

## 2024-11-25 MED ORDER — SERTRALINE HCL 50 MG PO TABS
50.0000 mg | ORAL_TABLET | Freq: Every day | ORAL | Status: DC
  Administered 2024-11-26 – 2024-11-27 (×2): 50 mg via ORAL
  Filled 2024-11-25 (×2): qty 1

## 2024-11-25 NOTE — BHH Group Notes (Signed)
 Adult Psychoeducational Group Note  Date:  11/25/2024 Time:  9:29 AM  Group Topic/Focus:  Goals Group:   The focus of this group is to help patients establish daily goals to achieve during treatment and discuss how the patient can incorporate goal setting into their daily lives to aide in recovery. Orientation:   The focus of this group is to educate the patient on the purpose and policies of crisis stabilization and provide a format to answer questions about their admission.  The group details unit policies and expectations of patients while admitted.  Participation Level:  Active  Participation Quality:  Appropriate  Affect:  Appropriate  Cognitive:  Appropriate  Insight: Appropriate  Engagement in Group:  Engaged  Modes of Intervention:  Discussion  Additional Comments:  Pt attended the goals group and remained appropriate and engaged throughout the duration of the group.   Bryah O[place]ltree O 11/25/2024, 9:29 AM

## 2024-11-25 NOTE — BHH Group Notes (Signed)
 Adult Psychoeducational Group Note  Date:  11/25/2024 Time:  12:55 PM  Group Topic/Focus: Social Work Group  Participation Level:  Did Not Attend Christina Mcbride 11/25/2024, 12:55 PM

## 2024-11-25 NOTE — [facility] Counselor (Signed)
 Adult Comprehensive Assessment  Patient ID: Christina Mcbride, female   DOB: [birth year], 53 y.o.   MRN: [id number]  Information Source: Information source: Patient  Current Stressors:  Patient states their primary concerns and needs for treatment are:: I have PTSD and too much going on for me; a sensory overload. Patient states their goals for this hospitilization and ongoing recovery are:: I need to find the right coping skills so I don't go to extremes again. Educational / Learning stressors: None reported. Employment / Job issues: Employed Family Relationships: Not good. Financial / Lack of resources (include bankruptcy): Yes; endorses financial issues after they were evicted from their home in May 2025.  Pt lives in [facility] for past several months with adult sons and romantic partner in one room. Housing / Lack of housing: Endorsed losing home back in May; now lives in a motel [place] housing from the county. Physical health (include injuries & life threatening diseases): Sciatica and arthritis. Social relationships: Denies having any social supports other than romantic partner. Substance abuse: Denies use of any and all substances. Bereavement / Loss: Pt's 47 year old daughter was 'beat to death' by father and has PTSD as a result.  Living/Environment/Situation:  Living Arrangements: Spouse/significant other, Children, Other (Comment) (Staying at Extended Stay since May 2025.) Living conditions (as described by patient or guardian): Chaotic; adult sons don't work and she and her partner are sole contributors. Who else lives in the home?: Three adult sons, romantic partner How long has patient lived in current situation?: Since May 2025 post eviction from home. What is atmosphere in current home: Chaotic  Family History:  Marital status: Long term relationship Long term relationship, how long?: A while. What types of issues is patient dealing with in the  relationship?: Trust-related issues due to infidelity in the past by partner. Additional relationship information: None reported. Are you sexually active?: Yes What is your sexual orientation?: Heterosexual Has your sexual activity been affected by drugs, alcohol, medication, or emotional stress?: No Does patient have children?: Yes How many children?: 3 How is patient's relationship with their children?: Good; however states that her adult sons are not working or can't find any jobs at the moment.  Childhood History:  By whom was/is the patient raised?: Both parents Description of patient's relationship with caregiver when they were a child: Okay Patient's description of current relationship with people who raised him/her: Okay How were you disciplined when you got in trouble as a child/adolescent?: Spankings. Did patient suffer any verbal/emotional/physical/sexual abuse as a child?: No Did patient suffer from severe childhood neglect?: No Has patient ever been sexually abused/assaulted/raped as an adolescent or adult?: No Was the patient ever a victim of a crime or a disaster?: No Witnessed domestic violence?: No Has patient been affected by domestic violence as an adult?: No  Education:  Highest grade of school patient has completed: 12 Currently a student?: No Learning disability?: No  Employment/Work Situation:   Patient's Job has Been Impacted by Current Illness: No Has Patient ever Been in the [employer]?: No  Financial Resources:   Financial resources: Income from employment Does patient have a representative payee or guardian?: No  Alcohol/Substance Abuse:   What has been your use of drugs/alcohol within the last 12 months?: Denies use of substances. If attempted suicide, did drugs/alcohol play a role in this?: No Alcohol/Substance Abuse Treatment Hx: Denies past history Has alcohol/substance abuse ever caused legal problems?: No  Social Support System:    [employer]  Support System: [employer] System: Partner, some co-workers. Type of faith/religion: Sherlean How does patient's faith help to cope with current illness?: N/A  Leisure/Recreation:   Do You Have Hobbies?: No  Strengths/Needs:   What is the patient's perception of their strengths?: I am the oldest in my family, and that's a lot of pressure.  I have no one. Patient states they can use these personal strengths during their treatment to contribute to their recovery: N/A Patient states these barriers may affect/interfere with their treatment: None reported. Patient states these barriers may affect their return to the community: None reported. Other important information patient would like considered in planning for their treatment: None reported.  Discharge Plan:   Currently receiving community mental health services: No Patient states concerns and preferences for aftercare planning are: None reported. Patient states they will know when they are safe and ready for discharge when: Pt reports wanting to find better coping prior to discharge. Does patient have access to transportation?: Yes Does patient have financial barriers related to discharge medications?: No Will patient be returning to same living situation after discharge?: Yes  Summary/Recommendations:   Summary and Recommendations (to be completed by the evaluator): Patient, Christina Mcbride is a 53 year old partnered cisgendered AA female who [facility] [address] [facility] with a history of PTSD and experiencing SI while feeling that her family would be better off without me.  She will not elaborate on a plan of self-harm but does have a history of self-harm in the past, no HI or AVH at this time. Pt was tearful throughout the assessment, wants to develop better coping skills for dealing with overwhelming stress from multiple areas of her life.  She and her three adult sons are staying at  [facility] for past six months since being evicted from their home in May 2025.  She and her partner are the two that bring in any income, however she does not qualify for financial assistance because I make too much according to the government but yet, I struggle to survive.  Pt denies the use of any and all substances; UDS indicates this as well; no substances were present.  At this time, she denies SI/HI/DI/SIB; sees that she does not have good coping skills for stress and is open to treatment, therapy, etc.  While here, Christina Mcbride can benefit from crisis stabilization, medication management, therapeutic milieu, and referrals for services.   Derick JONELLE Blanch, LCSW 11/25/2024

## 2024-11-25 NOTE — [facility] Group Notes (Signed)
 Adult Psychoeducational Group Note  Date:  11/25/2024 Time:  1:50 PM  Group Topic/Focus: Peer support group  Participation Level:  Did Not Attend Zamiah Tollett 11/25/2024, 1:50 PM

## 2024-11-25 NOTE — Group Note (Signed)
 Date:  11/25/2024 Time:  5:53 PM  Group Topic/Focus:  Overcoming Stress:   The focus of this group is to define stress and help patients assess their triggers. Stress Management    Participation Level:  Did Not Attend   Christina Mcbride Cassette 11/25/2024, 5:53 PM

## 2024-11-25 NOTE — Group Note (Signed)
 LCSW Group Therapy Note   Group Date: 11/25/2024 Start Time: 1100 End Time: 1200   Participation:  did not attend  Type of Therapy:  Group Therapy  Topic:  Healing Flames: Navigating Anger with Compassion  Objective:  Foster self-awareness and promote compassion toward oneself and others when dealing with anger.  Goals:  Help participants understand the underlying emotions and needs fueling anger. Provide coping strategies for healthier emotional expression and anger management.  Summary: This session explored anger as a volcano--an explosion driven by deeper feelings and unmet needs. Participants learned to identify anger triggers and underlying emotions, then practiced coping strategies like deep breathing, physical activity, and journaling. The group discussed healthy ways to manage anger before it escalates, using both personal reflection and shared experiences.  Therapeutic Modalities: Cognitive Behavioral Therapy (CBT): Challenging thoughts that fuel anger. Mindfulness: Increasing awareness of emotions and sensations.   Kamariah Fruchter O Jacquette Canales, LCSWA 11/25/2024  12:10 PM

## 2024-11-25 NOTE — [facility] Group Notes (Signed)
 Adult Psychoeducational Group Note  Date:  11/25/2024 Time:  10:37 AM  Group Topic/Focus: Pet Therapy  Participation Level:  Did Not Attend Christina Mcbride 11/25/2024, 10:37 AM
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
## 2024-11-25 NOTE — [facility] Group Notes (Signed)
 [facility] Group Notes:  (Nursing/MHT/Case Management/Adjunct)  Date:  11/25/2024  Time:  2000  Type of Therapy:  Wrap up group  Participation Level:  Active  Participation Quality:  Appropriate, Attentive, Sharing, and Supportive  Affect:  Appropriate  Cognitive:  Alert  Insight:  Improving  Engagement in Group:  Engaged  Modes of Intervention:  Clarification, Education, and Socialization  Summary of Progress/Problems: Positive thinking and self-care were discussed.   Lenora Manuelita RAMAN 11/25/2024, 9:14 PM

## 2024-11-25 NOTE — Group Note (Signed)
 Recreation Therapy Group Note   Group Topic:Animal Assisted Therapy   Group Date: 11/25/2024 Start Time: 9049 End Time: 1030 Facilitators: Mirai Greenwood-McCall, LRT,CTRS Location: [address] Dayroom   Animal-Assisted Activity (AAA) Program Checklist/Progress Notes Patient Eligibility Criteria Checklist & Daily Group note for Rec Tx Intervention  AAA/T Program Assumption of Risk Form signed by Patient/ or Parent Legal Guardian Yes  Patient understands his/her participation is voluntary Yes  Behavioral Response:    Education: Charity Fundraiser, Appropriate Animal Interaction   Education Outcome: Acknowledges education.    Affect/Mood: N/A   Participation Level: Did not attend    Clinical Observations/Individualized Feedback:      Plan: Continue to engage patient in RT group sessions 2-3x/week.   Gwendolen Hewlett-McCall, LRT,CTRS 11/25/2024 12:27 PM

## 2024-11-25 NOTE — Plan of Care (Signed)

## 2024-11-25 NOTE — Progress Notes (Signed)
" °   11/25/24 0818  Psych Admission Type (Psych Patients Only)  Admission Status Voluntary  Psychosocial Assessment  Patient Complaints Anxiety  Eye Contact Fair  Facial Expression Flat  Affect Flat;Depressed  Speech Logical/coherent  Interaction Minimal  Motor Activity Slow  Appearance/Hygiene In scrubs  Behavior Characteristics Anxious  Mood Depressed  Thought Process  Coherency WDL  Content Blaming self  Delusions None reported or observed  Perception WDL  Hallucination None reported or observed  Judgment Impaired  Confusion None  Danger to Self  Current suicidal ideation? Passive  Self-Injurious Behavior No self-injurious ideation or behavior indicators observed or expressed   Agreement Not to Harm Self Yes  Description of Agreement Verbal  Danger to Others  Danger to Others None reported or observed    "

## 2024-11-25 NOTE — Group Note (Deleted)
 Date:  11/25/2024 Time:  4:38 PM  Group Topic/Focus:  Emotional Education:   The focus of this group is to discuss what feelings/emotions are, and how they are experienced. Wellness Toolbox:   The focus of this group is to discuss various aspects of wellness, balancing those aspects and exploring ways to increase the ability to experience wellness.  Patients will create a wellness toolbox for use upon discharge.     Participation Level:  {B[facility] PARTICIPATION OZCZO:77735}  Participation Quality:  {B[facility] PARTICIPATION QUALITY:22265}  Affect:  {BHH AFFECT:22266}  Cognitive:  {BHH COGNITIVE:22267}  Insight: {BHH Insight2:20797}  Engagement in Group:  {B[facility] ENGAGEMENT IN HMNLE:77731}  Modes of Intervention:  {BHH MODES OF INTERVENTION:22269}  Additional Comments:  ***  Amandeep Nesmith N Marvena Tally 11/25/2024, 4:38 PM

## 2024-11-25 NOTE — B[facility] Suicide Risk Assessment (Signed)
 [facility] INPATIENT:  Family/Significant Other Suicide Prevention Education  Suicide Prevention Education:  Patient Refusal for Family/Significant Other Suicide Prevention Education: The patient Christina Mcbride has refused to provide written consent for family/significant other to be provided Family/Significant Other Suicide Prevention Education during admission and/or prior to discharge.  Physician notified.  Derick JONELLE Blanch 11/25/2024, 11:46 AM
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
## 2024-11-26 ENCOUNTER — Encounter (HOSPITAL_COMMUNITY): Payer: Self-pay

## 2024-11-26 LAB — HEMOGLOBIN A1C
Hgb A1c MFr Bld: 6 % — ABNORMAL HIGH (ref 4.8–5.6)
Mean Plasma Glucose: 125.5 mg/dL

## 2024-11-26 LAB — LIPID PANEL
Cholesterol: 173 mg/dL (ref 0–200)
HDL: 44 mg/dL
LDL Cholesterol: 112 mg/dL — ABNORMAL HIGH (ref 0–99)
Total CHOL/HDL Ratio: 3.9 ratio
Triglycerides: 85 mg/dL
VLDL: 17 mg/dL (ref 0–40)

## 2024-11-26 MED ORDER — PROPRANOLOL HCL 10 MG PO TABS
10.0000 mg | ORAL_TABLET | Freq: Two times a day (BID) | ORAL | Status: DC
  Administered 2024-11-26 – 2024-11-30 (×9): 10 mg via ORAL
  Filled 2024-11-26 (×9): qty 1

## 2024-11-26 NOTE — Plan of Care (Signed)
   Problem: Education: Goal: Emotional status will improve Outcome: Progressing

## 2024-11-26 NOTE — Progress Notes (Signed)
(  Sleep Hours) -8.25  (Any PRNs that were needed, meds refused, or side effects to meds)- hydroxyzine  25mg ; Trazodone  50mg   (Any disturbances and when (visitation, over night)-none  (Concerns raised by the patient)- none  (SI/HI/AVH)-denied

## 2024-11-26 NOTE — Group Note (Signed)
 Date:  11/26/2024 Time:  9:58 AM  Group Topic/Focus: Recreational Therapy Turning the page into a new year    Participation Level:  Did Not Attend   Christina Mcbride 11/26/2024, 9:58 AM

## 2024-11-26 NOTE — Group Note (Signed)
 Date:  11/26/2024 Time:  4:25 PM  Group Topic/Focus:  Building Self Esteem:   The Focus of this group is helping patients become aware of the effects of self-esteem on their lives, the things they and others do that enhance or undermine their self-esteem, seeing the relationship between their level of self-esteem and the choices they make and learning ways to enhance self-esteem. Conflict Resolution:   The focus of this group is to discuss the conflict resolution process and how it may be used upon discharge. Healthy Communication:   The focus of this group is to discuss communication, barriers to communication, as well as healthy ways to communicate with others.    Participation Level:  Did Not Attend   Christina Mcbride 11/26/2024, 4:25 PM

## 2024-11-26 NOTE — Group Note (Signed)
 Date:  11/26/2024 Time:  9:34 AM  Group Topic/Focus: Goal and orientation Goals Group:   The focus of this group is to help patients establish daily goals to achieve during treatment and discuss how the patient can incorporate goal setting into their daily lives to aide in recovery. Orientation:   The focus of this group is to educate the patient on the purpose and policies of crisis stabilization and provide a format to answer questions about their admission.  The group details unit policies and expectations of patients while admitted.    Participation Level:  Did Not Attend   Christina Mcbride 11/26/2024, 9:34 AM

## 2024-11-26 NOTE — Group Note (Signed)
 Date:  11/26/2024 Time:  3:56 PM  Group Topic/Focus:Pharmacy group Diagnosis Education:   The focus of this group is to discuss the major disorders that patients maybe diagnosed with.  Group discusses the importance of knowing what one's diagnosis is so that one can understand treatment and better advocate for oneself.    Participation Level:  Active   Christina Mcbride Christina Mcbride 11/26/2024, 3:56 PM

## 2024-11-26 NOTE — Progress Notes (Signed)
(  Sleep Hours) -8.5 (Any PRNs that were needed, meds refused, or side effects to meds)- Trazodone  and Atarax  (Any disturbances and when (visitation, over night)-none (Concerns raised by the patient)- none (SI/HI/AVH)-denied

## 2024-11-26 NOTE — Plan of Care (Signed)

## 2024-11-26 NOTE — Group Note (Unsigned)
 Date:  11/26/2024 Time:  4:37 PM  Group Topic/Focus:  Building Self Esteem:   The Focus of this group is helping patients become aware of the effects of self-esteem on their lives, the things they and others do that enhance or undermine their self-esteem, seeing the relationship between their level of self-esteem and the choices they make and learning ways to enhance self-esteem. Conflict Resolution:   The focus of this group is to discuss the conflict resolution process and how it may be used upon discharge. Healthy Communication:   The focus of this group is to discuss communication, barriers to communication, as well as healthy ways to communicate with others.     Participation Level:  {BHH PARTICIPATION OZCZO:77735}  Participation Quality:  {BHH PARTICIPATION QUALITY:22265}  Affect:  {BHH AFFECT:22266}  Cognitive:  {BHH COGNITIVE:22267}  Insight: {BHH Insight2:20797}  Engagement in Group:  {BHH ENGAGEMENT IN HMNLE:77731}  Modes of Intervention:  {BHH MODES OF INTERVENTION:22269}  Additional Comments:  ***  Christina Mcbride 11/26/2024, 4:37 PM

## 2024-11-26 NOTE — Group Note (Signed)
 Recreation Therapy Group Note   Group Topic:Stress Management  Group Date: 11/26/2024 Start Time: 0930 End Time: 9047 Facilitators: Leotta Weingarten-McCall, LRT,CTRS Location: [address] Dayroom   Group Topic: Stress Management  Goal Area(s) Addresses:  Patient will identify positive stress management techniques. Patient will identify benefits of using stress management post d/c.  Behavioral Response:   Intervention: Let's Meditate App  Activity: Turning the Page. LRT played a meditation that focused on reflecting on the year that's passed and planting seeds of intention for the beginning of the new year.  The meditation also wanted patients to focus on not expecting changes to happen immediately but to treat each moment like taking a breath.    Education:  Stress Management, Discharge Planning.   Education Outcome: Acknowledges Education   Affect/Mood: N/A   Participation Level: Did not attend    Clinical Observations/Individualized Feedback:      Plan: Continue to engage patient in RT group sessions 2-3x/week.   Naviah Belfield-McCall, LRT,CTRS 11/26/2024 12:42 PM

## 2024-11-26 NOTE — Progress Notes (Signed)
 [facility] MD Progress Note  11/26/2024 9:47 AM Christina Mcbride  MRN:  [id number] Subjective:   Christina Mcbride is a 53 yr old female who presented on 12/29 to [facility] with SI with worsening depression and multiple stressors, he was admitted to [facility] on 12/30. PPHx is significant for Depression, Anxiety, PTSD, Prior Suicide Attempts (last 2002), a remote History of Self Injurious Behavior (cutting- high school), and 1 Prior Psychiatric Hospitalization (2002).    Case was discussed in the multidisciplinary team. MAR was reviewed and patient was compliant with medications.  She received PRN Tylenol , Hydroxyzine , and Trazodone  yesterday.   Psychiatric Team made the following recommendations yesterday: -Stop Lexapro   -Start Zoloft 50 mg tomorrow for depression and anxiety -Start Abilify  2 mg daily for augmentation    On interview today patient reports she slept good last night.  She reports her appetite is doing good.  She reports some mild passive SI.  She reports no HI or AVH.  She reports no Paranoia or Ideas of Reference.  She reports possible issues with her medications.  She reports that yesterday she did have some nausea but it only lasted a short time after taking her dose of Abilify .  Discussed with her we would start Zoloft today as it is first line treatment for PTSD and she was agreeable with it.  She reports that she has been going to groups and challenging herself.  She reports that she called her sons yesterday and it was good.  She reports that since being here she feels heard/seen and this has been very helpful.  She reports that she did have some lightheadedness last night after a coughing spell but otherwise reports no other concerns at present.  Discussed with her that given her continued elevated blood pressure and anxiety we could start propanolol to address and she was agreeable.  She reports no other concerns at present.   Principal Problem: MDD (major depressive disorder),  recurrent severe, without psychosis (HCC) Diagnosis: Principal Problem:   MDD (major depressive disorder), recurrent severe, without psychosis (HCC)  Total Time spent with patient:  I personally spent 35 minutes on the unit in direct patient care. The direct patient care time included face-to-face time with the patient, reviewing the patient's chart, communicating with other professionals, and coordinating care.    Past Psychiatric History:  Depression, Anxiety, PTSD, Prior Suicide Attempts (last 2002), a remote History of Self Injurious Behavior (cutting- high school), and 1 Prior Psychiatric Hospitalization (2002).   Past Medical History:  Past Medical History:  Diagnosis Date   Amenia 11/2018   Blood transfusion without reported diagnosis    COVID-19 virus infection - May 2021 04/15/2020   History of hiatal hernia    Migraine    Orthostatic hypotension    PTSD (post-traumatic stress disorder)    reports daughter was murdered    Tachycardia    Thyroid  disease    Vitamin D  deficiency     Past Surgical History:  Procedure Laterality Date   BIOPSY  10/17/2019   Procedure: BIOPSY;  Surgeon: Legrand Victory LITTIE DOUGLAS, MD;  Location: [facility] ENDOSCOPY;  Service: Gastroenterology;;   COLONOSCOPY WITH PROPOFOL  N/A 10/17/2019   Procedure: COLONOSCOPY WITH PROPOFOL ;  Surgeon: Legrand Victory LITTIE DOUGLAS, MD;  Location: [facility] ENDOSCOPY;  Service: Gastroenterology;  Laterality: N/A;   ESOPHAGOGASTRODUODENOSCOPY (EGD) WITH PROPOFOL  N/A 10/17/2019   Procedure: ESOPHAGOGASTRODUODENOSCOPY (EGD) WITH PROPOFOL ;  Surgeon: Legrand Victory LITTIE DOUGLAS, MD;  Location: [facility] ENDOSCOPY;  Service: Gastroenterology;  Laterality: N/A;  HERNIA REPAIR     TUBAL LIGATION     Family History:  Family History  Problem Relation Age of Onset   Hypertension Mother    Arthritis Mother    Alzheimer's disease Mother    Diabetes Maternal Grandmother    Hypertension Son    Cancer Maternal Aunt    Family Psychiatric  History:  Brother-  Bipolar Disorder Uncle- EtOH Abuse No Known Suicides  Social History:  Social History   Substance and Sexual Activity  Alcohol Use No     Social History   Substance and Sexual Activity  Drug Use No    Social History   Socioeconomic History   Marital status: Single    Spouse name: Not on file   Number of children: 3   Years of education: Not on file   Highest education level: Not on file  Occupational History   Not on file  Tobacco Use   Smoking status: Never    Passive exposure: Never   Smokeless tobacco: Never  Vaping Use   Vaping status: Never Used  Substance and Sexual Activity   Alcohol use: No   Drug use: No   Sexual activity: Not Currently    Birth control/protection: Surgical  Other Topics Concern   Not on file  Social History Narrative   Pt relocated to [place] from [place] around 2012.      Lives in [place]; with significant other. 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    Social Drivers of Health   Tobacco Use: Low Risk (11/24/2024)   Patient History    Smoking Tobacco Use: Never    Smokeless Tobacco Use: Never    Passive Exposure: Never  Financial Resource Strain: Not on file  Food Insecurity: Food Insecurity Present (11/24/2024)   Epic    Worried About Programme Researcher, Broadcasting/film/video in the Last Year: Often true    Barista in the Last Year: Often true  Transportation Needs: Unmet Transportation Needs (11/24/2024)   Epic    Lack of Transportation (Medical): Yes    Lack of Transportation (Non-Medical): Yes  Physical Activity: Not on file  Stress: Not on file  Social Connections: Not on file  Depression (PHQ2-9): High Risk (11/24/2024)   Depression (PHQ2-9)    PHQ-2 Score: 21  Alcohol Screen: Not on file  Housing: High Risk (11/24/2024)   Epic    Unable to Pay for Housing in the Last Year: Yes    Number of Times Moved in the Last Year: 1    Homeless in the Last Year: Yes  Utilities: Not At Risk (11/24/2024)   Epic     Threatened with loss of utilities: No  Health Literacy: Not on file   Additional Social History:                         Sleep: Good Estimated Sleeping Duration (Last 24 Hours): 8.00-8.25 hours  Appetite:  Good  Current Medications: Current Facility-Administered Medications  Medication Dose Route Frequency Provider Last Rate Last Admin   acetaminophen  (TYLENOL ) tablet 650 mg  650 mg Oral Q6H PRN Motley-Mangrum, Jadeka A, PMHNP   650 mg at 11/25/24 1200   alum & mag hydroxide-simeth (MAALOX/MYLANTA) 200-200-20 MG/5ML suspension 30 mL  30 mL Oral Q4H PRN Motley-Mangrum, Jadeka A, PMHNP       ARIPiprazole  (ABILIFY ) tablet 2 mg  2 mg Oral Daily Leilany Digeronimo S, DO   2 mg at  11/26/24 0919   atorvastatin  (LIPITOR) tablet 20 mg  20 mg Oral Daily Motley-Mangrum, Jadeka A, PMHNP   20 mg at 11/26/24 0919   butalbital -acetaminophen -caffeine  (FIORICET ) 50-325-40 MG per tablet 1-2 tablet  1-2 tablet Oral Q6H PRN Motley-Mangrum, Jadeka A, PMHNP       haloperidol (HALDOL) tablet 5 mg  5 mg Oral TID PRN Motley-Mangrum, Jadeka A, PMHNP       And   diphenhydrAMINE  (BENADRYL ) capsule 50 mg  50 mg Oral TID PRN Motley-Mangrum, Jadeka A, PMHNP       haloperidol lactate (HALDOL) injection 5 mg  5 mg Intramuscular TID PRN Motley-Mangrum, Jadeka A, PMHNP       And   diphenhydrAMINE  (BENADRYL ) injection 50 mg  50 mg Intramuscular TID PRN Motley-Mangrum, Jadeka A, PMHNP       And   LORazepam  (ATIVAN ) injection 2 mg  2 mg Intramuscular TID PRN Motley-Mangrum, Jadeka A, PMHNP       haloperidol lactate (HALDOL) injection 10 mg  10 mg Intramuscular TID PRN Motley-Mangrum, Jadeka A, PMHNP       And   diphenhydrAMINE  (BENADRYL ) injection 50 mg  50 mg Intramuscular TID PRN Motley-Mangrum, Jadeka A, PMHNP       And   LORazepam  (ATIVAN ) injection 2 mg  2 mg Intramuscular TID PRN Motley-Mangrum, Jadeka A, PMHNP       hydrOXYzine  (ATARAX ) tablet 25 mg  25 mg Oral TID PRN Motley-Mangrum, Jadeka A, PMHNP    25 mg at 11/25/24 2103   magnesium  hydroxide (MILK OF MAGNESIA) suspension 30 mL  30 mL Oral Daily PRN Motley-Mangrum, Jadeka A, PMHNP       sertraline (ZOLOFT) tablet 50 mg  50 mg Oral Daily Tikesha Mort S, DO   50 mg at 11/26/24 0920   traZODone  (DESYREL ) tablet 50 mg  50 mg Oral QHS PRN Phoenicia Pirie S, DO   50 mg at 11/25/24 2103    Lab Results:  Results for orders placed or performed during the hospital encounter of 11/24/24 (from the past 48 hours)  Lipid panel     Status: Abnormal   Collection Time: 11/26/24  6:24 AM  Result Value Ref Range   Cholesterol 173 0 - 200 mg/dL    Comment:        ATP III CLASSIFICATION:  <200     mg/dL   Desirable  799-760  mg/dL   Borderline High  >=759    mg/dL   High           Triglycerides 85 <150 mg/dL   HDL 44 >59 mg/dL   Total CHOL/HDL Ratio 3.9 RATIO   VLDL 17 0 - 40 mg/dL   LDL Cholesterol 887 (H) 0 - 99 mg/dL    Comment:        Total Cholesterol/HDL:CHD Risk Coronary Heart Disease Risk Table                     Men   Women  1/2 Average Risk   3.4   3.3  Average Risk       5.0   4.4  2 X Average Risk   9.6   7.1  3 X Average Risk  23.4   11.0        Use the calculated Patient Ratio above and the CHD Risk Table to determine the patient's CHD Risk.        ATP III CLASSIFICATION (LDL):  <100     mg/dL  Optimal  100-129  mg/dL   Near or Above                    Optimal  130-159  mg/dL   Borderline  839-810  mg/dL   High  >809     mg/dL   Very High Performed at [facility], [address], [place], [place] [address]     Blood Alcohol level:  Lab Results  Component Value Date   [facility] <15 11/24/2024    Metabolic Disorder Labs: Lab Results  Component Value Date   HGBA1C 5.9 (H) 12/21/2023   MPG 102.54 11/30/2018   MPG 117 (H) 12/27/2013   No results found for: PROLACTIN Lab Results  Component Value Date   CHOL 173 11/26/2024   TRIG 85 11/26/2024   HDL 44 11/26/2024   CHOLHDL 3.9  11/26/2024   VLDL 17 11/26/2024   LDLCALC 112 (H) 11/26/2024   LDLCALC 104 (H) 12/26/2013    Physical Findings: AIMS:  ,  ,  ,  ,  ,  ,   CIWA:    COWS:     Musculoskeletal: Strength & Muscle Tone: within normal limits Gait & Station: normal Patient leans: N/A  Psychiatric Specialty Exam:  Presentation  General Appearance:  Appropriate for Environment; Casual  Eye Contact: Fair  Speech: Clear and Coherent; Normal Rate  Speech Volume: Normal  Handedness:No data recorded  Mood and Affect  Mood: Dysphoric; Anxious  Affect: Congruent   Thought Process  Thought Processes: Coherent; Goal Directed  Descriptions of Associations:Intact  Orientation:Full (Time, Place and Person)  Thought Content:Logical; WDL  History of Schizophrenia/Schizoaffective disorder:No data recorded Duration of Psychotic Symptoms:No data recorded Hallucinations:Hallucinations: None  Ideas of Reference:None  Suicidal Thoughts:Suicidal Thoughts: Yes, Passive  Homicidal Thoughts:Homicidal Thoughts: No   Sensorium  Memory: Immediate Fair; Recent Fair  Judgment: Intact  Insight: Present   Executive Functions  Concentration: Fair  Attention Span: Fair  Recall: Fair  Fund of Knowledge: Fair  Language: Good   Psychomotor Activity  Psychomotor Activity: Psychomotor Activity: Normal   Assets  Assets: Communication Skills; Desire for Improvement; Resilience   Sleep  Sleep: Sleep: Good    Physical Exam: Physical Exam Vitals and nursing note reviewed.  Constitutional:      General: She is not in acute distress.    Appearance: Normal appearance. She is obese. She is not ill-appearing or toxic-appearing.  HENT:     Head: Normocephalic and atraumatic.  Pulmonary:     Effort: Pulmonary effort is normal.  Musculoskeletal:        General: Normal range of motion.  Neurological:     General: No focal deficit present.     Mental Status: She is alert.     Review of Systems  Respiratory:  Negative for cough and shortness of breath.   Cardiovascular:  Negative for chest pain.  Gastrointestinal:  Negative for abdominal pain, constipation, diarrhea, nausea and vomiting.  Neurological:  Positive for dizziness (last night). Negative for weakness and headaches.  Psychiatric/Behavioral:  Positive for depression (improving) and suicidal ideas (mild passive). Negative for hallucinations. The patient is nervous/anxious (improving).    Blood pressure (!) 134/91, pulse 85, temperature 98 F (36.7 C), temperature source Oral, resp. rate 16, weight 128.5 kg, last menstrual period 05/04/2016, SpO2 99%. Body mass index is 44.36 kg/m.   Treatment Plan Summary: Daily contact with patient to assess and evaluate symptoms and progress in treatment and Medication management  Christina Mcbride  is a 53 yr old female who presented on 12/29 to [facility] with SI with worsening depression and multiple stressors, he was admitted to [facility] on 12/30.  PPHx is significant for Depression, Anxiety, PTSD, Prior Suicide Attempts (last 2002), a remote History of Self Injurious Behavior (cutting- high school), and 1 Prior Psychiatric Hospitalization (2002).     Jalaya did have some nausea last night after her Abilify  but otherwise has tolerated it.  We will proceed with starting her Zoloft today.  As she continues to have elevated BP and anxiety we will start Propanolol to address both.  We will continue to monitor.     MDD, Recurrent, Severe, w/out Psychosis  GAD  PTSD:  -Start Zoloft 50 mg for depression and anxiety -Continue Abilify  2 mg daily for augmentation  -Start Propanolol 10 mg BID for anxiety and BP -Continue Agitation Protocol: Haldol/Ativan /Benadryl      -Continue Atorvastatin  20 mg daily Continue Fioricet  50-325-40 mg 1-2 tablets q6 PRN headache -Continue PRN's: Tylenol , Maalox, Atarax , Milk of Magnesia, Trazodone    --  The  risks/benefits/side-effects/alternatives to medications were discussed in detail with the patient and time was given for questions. The patient consents to medication trials.                -- Metabolic profile and EKG monitoring obtained while on an atypical antipsychotic (BMI: 44.36, Lipid Panel: WNL except LDL: 112, HbgA1c: Pending, EKG: NSR w/ sinus arrhythmia w/QTc: 457)              -- Encouraged patient to participate in unit milieu and in scheduled group therapies              -- Short Term Goals: Ability to identify changes in lifestyle to reduce recurrence of condition will improve, Ability to verbalize feelings will improve, Ability to disclose and discuss suicidal ideas, Ability to demonstrate self-control will improve, Ability to identify and develop effective coping behaviors will improve, Ability to maintain clinical measurements within normal limits will improve, Compliance with prescribed medications will improve, and Ability to identify triggers associated with substance abuse/mental health issues will improve             -- Long Term Goals: Improvement in symptoms so as ready for discharge   Safety and Monitoring:             -- Voluntary admission to inpatient psychiatric unit for safety, stabilization and treatment             -- Daily contact with patient to assess and evaluate symptoms and progress in treatment             -- Patient's case to be discussed in multi-disciplinary team meeting             -- Observation Level : q15 minute checks             -- Vital signs:  q12 hours             -- Precautions: suicide, elopement, and assault  Discharge Planning:              -- Social work and case management to assist with discharge planning and identification of hospital follow-up needs prior to discharge             -- Estimated LOS: 3-5 more days             -- Discharge Concerns: Need to establish a safety plan; Medication compliance and effectiveness             --  Discharge Goals: Return home with outpatient referrals for mental health follow-up including medication management/psychotherapy   Marsa GORMAN Rosser, DO 11/26/2024, 9:47 AM

## 2024-11-26 NOTE — Progress Notes (Signed)
" °   11/26/24 0919  Psych Admission Type (Psych Patients Only)  Admission Status Voluntary  Psychosocial Assessment  Patient Complaints None  Eye Contact Fair  Facial Expression Flat  Affect Flat;Depressed  Speech Logical/coherent  Interaction Minimal  Motor Activity Slow  Appearance/Hygiene In scrubs  Behavior Characteristics Calm;Cooperative  Mood Depressed  Thought Process  Coherency WDL  Content Blaming self  Delusions None reported or observed  Perception WDL  Hallucination None reported or observed  Judgment Impaired  Confusion None  Danger to Self  Current suicidal ideation? Passive  Self-Injurious Behavior No self-injurious ideation or behavior indicators observed or expressed   Agreement Not to Harm Self Yes  Description of Agreement Verbal  Danger to Others  Danger to Others None reported or observed    "

## 2024-11-26 NOTE — Plan of Care (Signed)
   Problem: Education: Goal: Knowledge of [place] [facility] Education information/materials will improve Outcome: Progressing Goal: Emotional status will improve Outcome: Progressing Goal: Mental status will improve Outcome: Progressing

## 2024-11-26 NOTE — BH IP Treatment Plan (Signed)
 Interdisciplinary Treatment and Diagnostic Plan Update  11/26/2024 Time of Session: 1000 AM Christina Mcbride MRN: [id number]  Principal Diagnosis: MDD (major depressive disorder), recurrent severe, without psychosis (HCC)  Secondary Diagnoses: Principal Problem:   MDD (major depressive disorder), recurrent severe, without psychosis (HCC)   Current Medications:  Current Facility-Administered Medications  Medication Dose Route Frequency Provider Last Rate Last Admin   acetaminophen  (TYLENOL ) tablet 650 mg  650 mg Oral Q6H PRN Motley-Mangrum, Jadeka A, PMHNP   650 mg at 11/25/24 1200   alum & mag hydroxide-simeth (MAALOX/MYLANTA) 200-200-20 MG/5ML suspension 30 mL  30 mL Oral Q4H PRN Motley-Mangrum, Jadeka A, PMHNP       ARIPiprazole  (ABILIFY ) tablet 2 mg  2 mg Oral Daily Pashayan, Alexander S, DO   2 mg at 11/26/24 9080   atorvastatin  (LIPITOR) tablet 20 mg  20 mg Oral Daily Motley-Mangrum, Jadeka A, PMHNP   20 mg at 11/26/24 0919   butalbital -acetaminophen -caffeine  (FIORICET ) 50-325-40 MG per tablet 1-2 tablet  1-2 tablet Oral Q6H PRN Motley-Mangrum, Jadeka A, PMHNP       haloperidol (HALDOL) tablet 5 mg  5 mg Oral TID PRN Motley-Mangrum, Jadeka A, PMHNP       And   diphenhydrAMINE  (BENADRYL ) capsule 50 mg  50 mg Oral TID PRN Motley-Mangrum, Jadeka A, PMHNP       haloperidol lactate (HALDOL) injection 5 mg  5 mg Intramuscular TID PRN Motley-Mangrum, Jadeka A, PMHNP       And   diphenhydrAMINE  (BENADRYL ) injection 50 mg  50 mg Intramuscular TID PRN Motley-Mangrum, Jadeka A, PMHNP       And   LORazepam  (ATIVAN ) injection 2 mg  2 mg Intramuscular TID PRN Motley-Mangrum, Jadeka A, PMHNP       haloperidol lactate (HALDOL) injection 10 mg  10 mg Intramuscular TID PRN Motley-Mangrum, Jadeka A, PMHNP       And   diphenhydrAMINE  (BENADRYL ) injection 50 mg  50 mg Intramuscular TID PRN Motley-Mangrum, Jadeka A, PMHNP       And   LORazepam  (ATIVAN ) injection 2 mg  2 mg Intramuscular TID PRN  Motley-Mangrum, Jadeka A, PMHNP       hydrOXYzine  (ATARAX ) tablet 25 mg  25 mg Oral TID PRN Motley-Mangrum, Jadeka A, PMHNP   25 mg at 11/25/24 2103   magnesium  hydroxide (MILK OF MAGNESIA) suspension 30 mL  30 mL Oral Daily PRN Motley-Mangrum, Jadeka A, PMHNP       propranolol (INDERAL) tablet 10 mg  10 mg Oral BID Pashayan, Alexander S, DO   10 mg at 11/26/24 1016   sertraline (ZOLOFT) tablet 50 mg  50 mg Oral Daily Pashayan, Alexander S, DO   50 mg at 11/26/24 0920   traZODone  (DESYREL ) tablet 50 mg  50 mg Oral QHS PRN Pashayan, Alexander S, DO   50 mg at 11/25/24 2103   PTA Medications: Medications Prior to Admission  Medication Sig Dispense Refill Last Dose/Taking   atorvastatin  (LIPITOR) 20 MG tablet TAKE 1 TABLET(20 MG) BY MOUTH DAILY 90 tablet 1 Past Month   butalbital -acetaminophen -caffeine  (FIORICET ) 50-325-40 MG tablet Take 1-2 tablets by mouth every 6 (six) hours as needed for headache. 20 tablet 0 Past Month   escitalopram  (LEXAPRO ) 10 MG tablet TAKE 1 TABLET(10 MG) BY MOUTH AT BEDTIME 90 tablet 1 Past Month   methocarbamol  (ROBAXIN ) 500 MG tablet Take 1 tablet (500 mg total) by mouth 2 (two) times daily as needed for muscle spasms. 20 tablet 0 Past Month   Semaglutide , 1  MG/DOSE, (OZEMPIC , 1 MG/DOSE,) 4 MG/3ML SOPN INJCT 1 MG UNDER THE SKIN ONCE A WEEK AS DIRECTED 9 mL 0 Past Week   ferrous sulfate  325 (65 FE) MG tablet Take 1 tablet (325 mg total) by mouth 2 (two) times daily with a meal. (Patient taking differently: Take 325 mg by mouth daily with breakfast.) 180 tablet 1     Patient Stressors:    Patient Strengths:    Treatment Modalities: Medication Management, Group therapy, Case management,  1 to 1 session with clinician, Psychoeducation, Recreational therapy.   Physician Treatment Plan for Primary Diagnosis: MDD (major depressive disorder), recurrent severe, without psychosis (HCC) Long Term Goal(s): Improvement in symptoms so as ready for discharge   Short Term  Goals: Ability to identify changes in lifestyle to reduce recurrence of condition will improve Ability to verbalize feelings will improve Ability to disclose and discuss suicidal ideas Ability to identify and develop effective coping behaviors will improve Ability to maintain clinical measurements within normal limits will improve Ability to identify triggers associated with substance abuse/mental health issues will improve  Medication Management: Evaluate patient's response, side effects, and tolerance of medication regimen.  Therapeutic Interventions: 1 to 1 sessions, Unit Group sessions and Medication administration.  Evaluation of Outcomes: Not Progressing  Physician Treatment Plan for Secondary Diagnosis: Principal Problem:   MDD (major depressive disorder), recurrent severe, without psychosis (HCC)  Long Term Goal(s): Improvement in symptoms so as ready for discharge   Short Term Goals: Ability to identify changes in lifestyle to reduce recurrence of condition will improve Ability to verbalize feelings will improve Ability to disclose and discuss suicidal ideas Ability to identify and develop effective coping behaviors will improve Ability to maintain clinical measurements within normal limits will improve Ability to identify triggers associated with substance abuse/mental health issues will improve     Medication Management: Evaluate patient's response, side effects, and tolerance of medication regimen.  Therapeutic Interventions: 1 to 1 sessions, Unit Group sessions and Medication administration.  Evaluation of Outcomes: Not Progressing   RN Treatment Plan for Primary Diagnosis: MDD (major depressive disorder), recurrent severe, without psychosis (HCC) Long Term Goal(s): Knowledge of disease and therapeutic regimen to maintain health will improve  Short Term Goals: Ability to remain free from injury will improve, Ability to verbalize frustration and anger appropriately will  improve, Ability to demonstrate self-control, Ability to participate in decision making will improve, Ability to verbalize feelings will improve, Ability to disclose and discuss suicidal ideas, Ability to identify and develop effective coping behaviors will improve, and Compliance with prescribed medications will improve  Medication Management: RN will administer medications as ordered by provider, will assess and evaluate patient's response and provide education to patient for prescribed medication. RN will report any adverse and/or side effects to prescribing provider.  Therapeutic Interventions: 1 on 1 counseling sessions, Psychoeducation, Medication administration, Evaluate responses to treatment, Monitor vital signs and CBGs as ordered, Perform/monitor CIWA, COWS, AIMS and Fall Risk screenings as ordered, Perform wound care treatments as ordered.  Evaluation of Outcomes: Not Progressing   LCSW Treatment Plan for Primary Diagnosis: MDD (major depressive disorder), recurrent severe, without psychosis (HCC) Long Term Goal(s): Safe transition to appropriate next level of care at discharge, Engage patient in therapeutic group addressing interpersonal concerns.  Short Term Goals: Engage patient in aftercare planning with referrals and resources, Increase social support, Increase ability to appropriately verbalize feelings, Increase emotional regulation, Facilitate acceptance of mental health diagnosis and concerns, Facilitate patient progression through stages of change  regarding substance use diagnoses and concerns, Identify triggers associated with mental health/substance abuse issues, and Increase skills for wellness and recovery  Therapeutic Interventions: Assess for all discharge needs, 1 to 1 time with Social worker, Explore available resources and support systems, Assess for adequacy in community support network, Educate family and significant other(s) on suicide prevention, Complete Psychosocial  Assessment, Interpersonal group therapy.  Evaluation of Outcomes: Not Progressing   Progress in Treatment: Attending groups: No. Attended very few Participating in groups: Yes. When attends Taking medication as prescribed: Yes. Toleration medication: Yes. Family/Significant other contact made: No, will contact:  pt declined consents Patient understands diagnosis: Yes. Discussing patient identified problems/goals with staff: Yes. Medical problems stabilized or resolved: Yes. Denies suicidal/homicidal ideation: Yes. Issues/concerns per patient self-inventory: No.  New problem(s) identified: Yes, Describe:  pt would like work note faxed to her job to avoid termination  New Short Term/Long Term Goal(s): medication stabilization, elimination of SI thoughts, development of comprehensive mental wellness plan.    Patient Goals:  Medication changes, lower my anxiety level, have healthier boundaries  Discharge Plan or Barriers: Patient recently admitted. CSW will continue to follow and assess for appropriate referrals and possible discharge planning.    Reason for Continuation of Hospitalization: Anxiety Depression Medication stabilization  Estimated Length of Stay: 5-7 days  Last 3 [place] Suicide Severity Risk Score: Flowsheet Row [facility] (Current[facility] Most recent reading at 11/24/2024  6:02 PM ED from 11/24/2024 in [facility] Department [facility] Most recent reading at 11/24/2024  1:03 AM ED from 10/31/2024 in [facility] Emergency Department at [facility] Most recent reading at 10/31/2024 11:09 AM  C-SSRS RISK CATEGORY High Risk High Risk No Risk    Last PHQ 2/9 Scores:    11/24/2024   10:25 AM 08/29/2023    4:13 PM 07/28/2020    2:59 PM  Depression screen PHQ 2/9  Decreased Interest 3 2 2   Down, Depressed, Hopeless 3 2 2   PHQ - 2 Score 6 4 4   Altered sleeping 2 3 3   Tired,  decreased energy 2 3 2   Change in appetite 2 3 3   Feeling bad or failure about yourself  3 3 3   Trouble concentrating 2 3 2   Moving slowly or fidgety/restless 1 0 0  Suicidal thoughts 3 1 0  PHQ-9 Score 21 20  17    Difficult doing work/chores  Somewhat difficult Somewhat difficult     Data saved with a previous flowsheet row definition    Scribe for Treatment Team: Jenkins LULLA Primer, LCSWA 11/26/2024 11:47 AM

## 2024-11-27 DIAGNOSIS — I10 Essential (primary) hypertension: Secondary | ICD-10-CM | POA: Insufficient documentation

## 2024-11-27 MED ORDER — SERTRALINE HCL 100 MG PO TABS
100.0000 mg | ORAL_TABLET | Freq: Every day | ORAL | Status: DC
  Administered 2024-11-28 – 2024-11-30 (×3): 100 mg via ORAL
  Filled 2024-11-27 (×3): qty 1

## 2024-11-27 MED ORDER — AMLODIPINE BESYLATE 5 MG PO TABS
5.0000 mg | ORAL_TABLET | Freq: Every day | ORAL | Status: DC
  Administered 2024-11-27 – 2024-11-30 (×4): 5 mg via ORAL
  Filled 2024-11-27 (×4): qty 1

## 2024-11-27 NOTE — Group Note (Signed)
 Date:  11/27/2024 Time:  1:43 PM  Group Topic/Focus: Emotional Wellness Aims to help participants gain a deeper understanding of their anger and its underlying emotions. By exploring the iceberg model, the group focuses on recognizing that anger is often just the visible tip of a much larger emotional experience. The goal is to teach strategies for managing anger more effectively, promoting emotional awareness, and encouraging healthier emotional expression.     Participation Level:  Did Not Attend   Christina Mcbride 11/27/2024, 1:43 PM

## 2024-11-27 NOTE — Progress Notes (Signed)
 D:  Patient's self inventory sheet, patient sleeps good, sleep medicine helpful.  Good appetite, normal energy level, good concentration.  Rated depression and anxiety 4, hopeless 2.  Denied withdrawals.  Denied SI.  Physical problems,, pain, lower back and L knee, worst pain 3.5 in past 24 hours.   Goal is work on anxiety.  Work on self care.  Worried about HTN.  Does have discharge plans. A:  Medications administered per MD orders.  Emotional support and encouragement given patient. R:  Denied SI and HI, contracts for safety.  Denied A/V hallucinations.  Safety maintained with 15 minute checks. MD aware of HTN.

## 2024-11-27 NOTE — Progress Notes (Signed)
(  Sleep Hours) - (Any PRNs that were needed, meds refused, or side effects to meds)- PRN Hydroxyzine  and PRN Trazodone  (Any disturbances and when (visitation, over night)- None (Concerns raised by the patient)- None (SI/HI/AVH)- Denies. Contracts for safety.
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
## 2024-11-27 NOTE — Care Management Important Message (Signed)
 Medicare IM printed and given to social work to give to the patient. ?

## 2024-11-27 NOTE — [facility] Group Notes (Signed)
 Psychoeducational Group Note  Date:  11/27/2024 Time:  2000  Group Topic/Focus:  Wrap up group  Participation Level: Did Not Attend  Participation Quality:  Not Applicable  Affect:  Not Applicable  Cognitive:  Not Applicable  Insight:  Not Applicable  Engagement in Group: Not Applicable  Additional Comments:  Did not attend.   Lenora Shaver S 11/27/2024, 9:01 PM

## 2024-11-27 NOTE — Group Note (Signed)
 LCSW Group Therapy Note   Group Date: 11/27/2024 Start Time: 1100 End Time: 1200   Participation:  did not attend   Type of Therapy:  Group Therapy  Topic:  Healing Hearts:  A Safe Space for Grief  Objective:   The objective of this class, Healing Hearts:  A Safe Space for Grief, is to create a compassionate environment where participants can process their grief, explore different stages of grief, and discover ways to honor their loved ones through personal rituals.  3 Goals: Provide a safe and supportive space where participants feel comfortable sharing their feelings and experiences of grief without judgment. Educate participants about the stages of grief and emphasize that there is no right way to grieve or a fixed timeline for healing. Introduce the concept of rituals as a means to process grief, allowing individuals to honor their loved ones in a personal and meaningful way.  Summary:  In Healing Hearts: A Safe Space for Grief, we explored the unique and personal journey of grief, emphasizing that everyone experiences it differently.  We discussed the five stages of grief (denial, anger, bargaining, depression, and acceptance), with the understanding that grief is not linear.  Rituals were introduced as a way to help cope with loss, offering comfort and connection through meaningful actions such as lighting candles or taking memory walks. Participants were encouraged to express their emotions, focus on self-care, and reflect on moments of gratitude for their loved ones, recognizing that healing is a process and there is no timeline for grief.  Therapeutic Modalities: Elements of CBT: Challenge thoughts, reframe beliefs, self-compassion Elements of DBT: Mindfulness, distress tolerance, emotion regulation Supportive Therapy:  Provide validation, foster a safe and supportive group environment, normalize grief   Christina Mcbride O Christina Mcbride, LCSWA 11/27/2024  12:23 PM

## 2024-11-27 NOTE — Group Note (Signed)
 Date:  11/27/2024 Time:  12:18 PM  Group Topic/Focus: Social Work   Pt did not attend social work group  Hennessey Cantrell R Logann Whitebread 11/27/2024, 12:18 PM

## 2024-11-27 NOTE — Progress Notes (Signed)
 Dechelle Darnice Montoya  Type of Note: Hospitalization  Spoke to Tevin Bass (verbal permission given from pt) to reach out to her boss to let him know she has been hospitalized and she will have a note upon discharge regarding her treatment here at [facility].  Tevin did not have any questions and thanked this CSW for the [place].  Signed: Gaynel Schaafsma, LCSW 11/27/2024 3:30 PM

## 2024-11-27 NOTE — Group Note (Signed)
 Date:  11/27/2024 Time:  9:38 AM  Group Topic/Focus:  Goals Group:   The focus of this group is to help patients establish daily goals to achieve during treatment and discuss how the patient can incorporate goal setting into their daily lives to aide in recovery. Orientation:   The focus of this group is to educate the patient on the purpose and policies of crisis stabilization and provide a format to answer questions about their admission.  The group details unit policies and expectations of patients while admitted.    Participation Level:  Did Not Attend   Millicent Blazejewski R Milca Sytsma 11/27/2024, 9:38 AM

## 2024-11-27 NOTE — Plan of Care (Signed)
 Nurse discussed anxiety, depression and coping skills with patient.

## 2024-11-27 NOTE — Progress Notes (Signed)
 [facility] MD Progress Note  11/27/2024 8:56 AM Christina Mcbride  MRN:  [id number] Subjective:   Christina Mcbride is a 54 yr old female who presented on 12/29 to [facility] with SI with worsening depression and multiple stressors, he was admitted to [facility] on 12/30. PPHx is significant for Depression, Anxiety, PTSD, Prior Suicide Attempts (last 2002), a remote History of Self Injurious Behavior (cutting- high school), and 1 Prior Psychiatric Hospitalization (2002).    Case was discussed in the multidisciplinary team. MAR was reviewed and patient was compliant with medications.  She received PRN Tylenol , Hydroxyzine , and Trazodone  yesterday.   Psychiatric Team made the following recommendations yesterday: -Start Zoloft 50 mg for depression and anxiety -Continue Abilify  2 mg daily for augmentation  -Start Propanolol 10 mg BID for anxiety and BP   On interview today patient reports she slept good last night.  She reports her appetite is doing good.  She reports no SI, HI, or AVH.  She reports no Paranoia or Ideas of Reference.  She reports no issues with her medications.  Discussed with her that we would plan to further increase her Zoloft tomorrow and she was agreeable.  Discussed with her that her blood pressure continues to be elevated.  Discussed starting Amlodipine and she was agreeable.  She reports no other concerns at present.   Principal Problem: MDD (major depressive disorder), recurrent severe, without psychosis (HCC) Diagnosis: Principal Problem:   MDD (major depressive disorder), recurrent severe, without psychosis (HCC) Active Problems:   Hypertension  Total Time spent with patient:  I personally spent 35 minutes on the unit in direct patient care. The direct patient care time included face-to-face time with the patient, reviewing the patient's chart, communicating with other professionals, and coordinating care.    Past Psychiatric History:  Depression, Anxiety, PTSD, Prior Suicide  Attempts (last 2002), a remote History of Self Injurious Behavior (cutting- high school), and 1 Prior Psychiatric Hospitalization (2002).   Past Medical History:  Past Medical History:  Diagnosis Date   Amenia 11/2018   Blood transfusion without reported diagnosis    COVID-19 virus infection - May 2021 04/15/2020   History of hiatal hernia    Migraine    Orthostatic hypotension    PTSD (post-traumatic stress disorder)    reports daughter was murdered    Tachycardia    Thyroid  disease    Vitamin D  deficiency     Past Surgical History:  Procedure Laterality Date   BIOPSY  10/17/2019   Procedure: BIOPSY;  Surgeon: Legrand Victory LITTIE DOUGLAS, MD;  Location: [facility] ENDOSCOPY;  Service: Gastroenterology;;   COLONOSCOPY WITH PROPOFOL  N/A 10/17/2019   Procedure: COLONOSCOPY WITH PROPOFOL ;  Surgeon: Legrand Victory LITTIE DOUGLAS, MD;  Location: [facility] ENDOSCOPY;  Service: Gastroenterology;  Laterality: N/A;   ESOPHAGOGASTRODUODENOSCOPY (EGD) WITH PROPOFOL  N/A 10/17/2019   Procedure: ESOPHAGOGASTRODUODENOSCOPY (EGD) WITH PROPOFOL ;  Surgeon: Legrand Victory LITTIE DOUGLAS, MD;  Location: [facility] ENDOSCOPY;  Service: Gastroenterology;  Laterality: N/A;   HERNIA REPAIR     TUBAL LIGATION     Family History:  Family History  Problem Relation Age of Onset   Hypertension Mother    Arthritis Mother    Alzheimer's disease Mother    Diabetes Maternal Grandmother    Hypertension Son    Cancer Maternal Aunt    Family Psychiatric  History:  Brother- Bipolar Disorder Uncle- EtOH Abuse No Known Suicides  Social History:  Social History   Substance and Sexual Activity  Alcohol Use No  Social History   Substance and Sexual Activity  Drug Use No    Social History   Socioeconomic History   Marital status: Single    Spouse name: Not on file   Number of children: 3   Years of education: Not on file   Highest education level: Not on file  Occupational History   Not on file  Tobacco Use   Smoking status: Never    Passive  exposure: Never   Smokeless tobacco: Never  Vaping Use   Vaping status: Never Used  Substance and Sexual Activity   Alcohol use: No   Drug use: No   Sexual activity: Not Currently    Birth control/protection: Surgical  Other Topics Concern   Not on file  Social History Narrative   Pt relocated to [facility] from [place] around 2012.      Lives in [place]; with significant other. 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    Social Drivers of Health   Tobacco Use: Low Risk (11/24/2024)   Patient History    Smoking Tobacco Use: Never    Smokeless Tobacco Use: Never    Passive Exposure: Never  Financial Resource Strain: Not on file  Food Insecurity: Food Insecurity Present (11/24/2024)   Epic    Worried About Programme Researcher, Broadcasting/film/video in the Last Year: Often true    Barista in the Last Year: Often true  Transportation Needs: Unmet Transportation Needs (11/24/2024)   Epic    Lack of Transportation (Medical): Yes    Lack of Transportation (Non-Medical): Yes  Physical Activity: Not on file  Stress: Not on file  Social Connections: Not on file  Depression (PHQ2-9): High Risk (11/24/2024)   Depression (PHQ2-9)    PHQ-2 Score: 21  Alcohol Screen: Not on file  Housing: High Risk (11/24/2024)   Epic    Unable to Pay for Housing in the Last Year: Yes    Number of Times Moved in the Last Year: 1    Homeless in the Last Year: Yes  Utilities: Not At Risk (11/24/2024)   Epic    Threatened with loss of utilities: No  Health Literacy: Not on file   Additional Social History:                         Sleep: Good Estimated Sleeping Duration (Last 24 Hours): 6.00-8.25 hours  Appetite:  Good  Current Medications: Current Facility-Administered Medications  Medication Dose Route Frequency Provider Last Rate Last Admin   acetaminophen  (TYLENOL ) tablet 650 mg  650 mg Oral Q6H PRN Motley-Mangrum, Jadeka A, PMHNP   650 mg at 11/26/24 1605   alum & mag  hydroxide-simeth (MAALOX/MYLANTA) 200-200-20 MG/5ML suspension 30 mL  30 mL Oral Q4H PRN Motley-Mangrum, Jadeka A, PMHNP       amLODipine (NORVASC) tablet 5 mg  5 mg Oral Daily Monnie Anspach S, DO       ARIPiprazole  (ABILIFY ) tablet 2 mg  2 mg Oral Daily Laresha Bacorn S, DO   2 mg at 11/27/24 9177   atorvastatin  (LIPITOR) tablet 20 mg  20 mg Oral Daily Motley-Mangrum, Jadeka A, PMHNP   20 mg at 11/27/24 9176   butalbital -acetaminophen -caffeine  (FIORICET ) 50-325-40 MG per tablet 1-2 tablet  1-2 tablet Oral Q6H PRN Motley-Mangrum, Jadeka A, PMHNP       haloperidol (HALDOL) tablet 5 mg  5 mg Oral TID PRN Motley-Mangrum, Jadeka A, PMHNP  And   diphenhydrAMINE  (BENADRYL ) capsule 50 mg  50 mg Oral TID PRN Motley-Mangrum, Jadeka A, PMHNP       haloperidol lactate (HALDOL) injection 5 mg  5 mg Intramuscular TID PRN Motley-Mangrum, Jadeka A, PMHNP       And   diphenhydrAMINE  (BENADRYL ) injection 50 mg  50 mg Intramuscular TID PRN Motley-Mangrum, Jadeka A, PMHNP       And   LORazepam  (ATIVAN ) injection 2 mg  2 mg Intramuscular TID PRN Motley-Mangrum, Jadeka A, PMHNP       haloperidol lactate (HALDOL) injection 10 mg  10 mg Intramuscular TID PRN Motley-Mangrum, Jadeka A, PMHNP       And   diphenhydrAMINE  (BENADRYL ) injection 50 mg  50 mg Intramuscular TID PRN Motley-Mangrum, Jadeka A, PMHNP       And   LORazepam  (ATIVAN ) injection 2 mg  2 mg Intramuscular TID PRN Motley-Mangrum, Jadeka A, PMHNP       hydrOXYzine  (ATARAX ) tablet 25 mg  25 mg Oral TID PRN Motley-Mangrum, Jadeka A, PMHNP   25 mg at 11/27/24 9176   magnesium  hydroxide (MILK OF MAGNESIA) suspension 30 mL  30 mL Oral Daily PRN Motley-Mangrum, Jadeka A, PMHNP       propranolol (INDERAL) tablet 10 mg  10 mg Oral BID Dynastie Knoop S, DO   10 mg at 11/27/24 9177   [START ON 11/28/2024] sertraline (ZOLOFT) tablet 100 mg  100 mg Oral Daily Renardo Cheatum S, DO       traZODone  (DESYREL ) tablet 50 mg  50 mg Oral QHS PRN  Woods Gangemi S, DO   50 mg at 11/26/24 2103    Lab Results:  Results for orders placed or performed during the hospital encounter of 11/24/24 (from the past 48 hours)  Hemoglobin A1c     Status: Abnormal   Collection Time: 11/26/24  6:24 AM  Result Value Ref Range   Hgb A1c MFr Bld 6.0 (H) 4.8 - 5.6 %    Comment: (NOTE) Diagnosis of Diabetes The following HbA1c ranges recommended by the American Diabetes Association (ADA) may be used as an aid in the diagnosis of diabetes mellitus.  Hemoglobin             Suggested A1C NGSP%              Diagnosis  <5.7                   Non Diabetic  5.7-6.4                Pre-Diabetic  >6.4                   Diabetic  <7.0                   Glycemic control for                       adults with diabetes.     Mean Plasma Glucose 125.5 mg/dL    Comment: Performed at [facility], [address], [place], [place] [address]  Lipid panel     Status: Abnormal   Collection Time: 11/26/24  6:24 AM  Result Value Ref Range   Cholesterol 173 0 - 200 mg/dL    Comment:        ATP III CLASSIFICATION:  <200     mg/dL   Desirable  799-760  mg/dL   Borderline High  >=759  mg/dL   High           Triglycerides 85 <150 mg/dL   HDL 44 >59 mg/dL   Total CHOL/HDL Ratio 3.9 RATIO   VLDL 17 0 - 40 mg/dL   LDL Cholesterol 887 (H) 0 - 99 mg/dL    Comment:        Total Cholesterol/HDL:CHD Risk Coronary Heart Disease Risk Table                     Men   Women  1/2 Average Risk   3.4   3.3  Average Risk       5.0   4.4  2 X Average Risk   9.6   7.1  3 X Average Risk  23.4   11.0        Use the calculated Patient Ratio above and the CHD Risk Table to determine the patient's CHD Risk.        ATP III CLASSIFICATION (LDL):  <100     mg/dL   Optimal  899-870  mg/dL   Near or Above                    Optimal  130-159  mg/dL   Borderline  839-810  mg/dL   High  >809     mg/dL   Very High Performed at [facility], [address], [place], [place] [address]     Blood Alcohol level:  Lab Results  Component Value Date   [facility] <15 11/24/2024    Metabolic Disorder Labs: Lab Results  Component Value Date   HGBA1C 6.0 (H) 11/26/2024   MPG 125.5 11/26/2024   MPG 102.54 11/30/2018   No results found for: PROLACTIN Lab Results  Component Value Date   CHOL 173 11/26/2024   TRIG 85 11/26/2024   HDL 44 11/26/2024   CHOLHDL 3.9 11/26/2024   VLDL 17 11/26/2024   LDLCALC 112 (H) 11/26/2024   LDLCALC 104 (H) 12/26/2013    Physical Findings: AIMS:  ,  ,  ,  ,  ,  ,   CIWA:    COWS:     Musculoskeletal: Strength & Muscle Tone: within normal limits Gait & Station: normal Patient leans: N/A  Psychiatric Specialty Exam:  Presentation  General Appearance:  Appropriate for Environment; Casual  Eye Contact: Fair  Speech: Clear and Coherent; Normal Rate  Speech Volume: Normal  Handedness:No data recorded  Mood and Affect  Mood: Dysphoric  Affect: Congruent   Thought Process  Thought Processes: Coherent; Goal Directed  Descriptions of Associations:Intact  Orientation:Full (Time, Place and Person)  Thought Content:Logical; WDL  History of Schizophrenia/Schizoaffective disorder:No data recorded Duration of Psychotic Symptoms:No data recorded Hallucinations:Hallucinations: None  Ideas of Reference:None  Suicidal Thoughts:Suicidal Thoughts: No  Homicidal Thoughts:Homicidal Thoughts: No   Sensorium  Memory: Immediate Fair; Recent Fair  Judgment: Fair  Insight: Fair   Art Therapist  Concentration: Fair  Attention Span: Fair  Recall: Fair  Fund of Knowledge: Fair  Language: Good   Psychomotor Activity  Psychomotor Activity: Psychomotor Activity: Normal   Assets  Assets: Communication Skills; Desire for Improvement; Resilience   Sleep  Sleep: Sleep: Good    Physical Exam: Physical Exam Vitals and nursing note reviewed.   Constitutional:      General: She is not in acute distress.    Appearance: Normal appearance. She is obese. She is not ill-appearing or toxic-appearing.  HENT:     Head:  Normocephalic and atraumatic.  Pulmonary:     Effort: Pulmonary effort is normal.  Musculoskeletal:        General: Normal range of motion.  Neurological:     General: No focal deficit present.     Mental Status: She is alert.    Review of Systems  Respiratory:  Negative for cough and shortness of breath.   Cardiovascular:  Negative for chest pain.  Gastrointestinal:  Negative for abdominal pain, constipation, diarrhea, nausea and vomiting.  Neurological:  Negative for dizziness, weakness and headaches.  Psychiatric/Behavioral:  Positive for depression. Negative for hallucinations and suicidal ideas. The patient is not nervous/anxious.    Blood pressure (!) 154/95, pulse 75, temperature 98.4 F (36.9 C), temperature source Oral, resp. rate 20, weight 128.5 kg, last menstrual period 05/04/2016, SpO2 100%. Body mass index is 44.36 kg/m.   Treatment Plan Summary: Daily contact with patient to assess and evaluate symptoms and progress in treatment and Medication management  Christina Mcbride is a 54 yr old female who presented on 12/29 to [facility] with SI with worsening depression and multiple stressors, he was admitted to [facility] on 12/30.  PPHx is significant for Depression, Anxiety, PTSD, Prior Suicide Attempts (last 2002), a remote History of Self Injurious Behavior (cutting- high school), and 1 Prior Psychiatric Hospitalization (2002).     Christina Mcbride tolerated the start of Zoloft yesterday.  She also tolerated starting the propranolol.  Her blood pressure continues to be elevated so we will start Amlodipine to address this.  We will plan to further increase her Zoloft tomorrow.  We will continue to monitor.      MDD, Recurrent, Severe, w/out Psychosis  GAD  PTSD:  -Continue Zoloft 50 mg for depression and  anxiety -Continue Abilify  2 mg daily for augmentation  -Continue Propanolol 10 mg BID for anxiety and BP -Continue Agitation Protocol: Haldol/Ativan /Benadryl     HTN: -Start Amlodipine 5 mg daily    -Continue Atorvastatin  20 mg daily Continue Fioricet  50-325-40 mg 1-2 tablets q6 PRN headache -Continue PRN's: Tylenol , Maalox, Atarax , Milk of Magnesia, Trazodone    --  The risks/benefits/side-effects/alternatives to medications were discussed in detail with the patient and time was given for questions. The patient consents to medication trials.                -- Metabolic profile and EKG monitoring obtained while on an atypical antipsychotic (BMI: 44.36, Lipid Panel: WNL except LDL: 112, HbgA1c: Pending, EKG: NSR w/ sinus arrhythmia w/QTc: 457)              -- Encouraged patient to participate in unit milieu and in scheduled group therapies              -- Short Term Goals: Ability to identify changes in lifestyle to reduce recurrence of condition will improve, Ability to verbalize feelings will improve, Ability to disclose and discuss suicidal ideas, Ability to demonstrate self-control will improve, Ability to identify and develop effective coping behaviors will improve, Ability to maintain clinical measurements within normal limits will improve, Compliance with prescribed medications will improve, and Ability to identify triggers associated with substance abuse/mental health issues will improve             -- Long Term Goals: Improvement in symptoms so as ready for discharge   Safety and Monitoring:             -- Voluntary admission to inpatient psychiatric unit for safety, stabilization and treatment             --  Daily contact with patient to assess and evaluate symptoms and progress in treatment             -- Patient's case to be discussed in multi-disciplinary team meeting             -- Observation Level : q15 minute checks             -- Vital signs:  q12 hours             --  Precautions: suicide, elopement, and assault  Discharge Planning:              -- Social work and case management to assist with discharge planning and identification of hospital follow-up needs prior to discharge             -- Estimated LOS: 2-4 more days             -- Discharge Concerns: Need to establish a safety plan; Medication compliance and effectiveness             -- Discharge Goals: Return home with outpatient referrals for mental health follow-up including medication management/psychotherapy   Marsa GORMAN Rosser, DO 11/27/2024, 8:56 AM

## 2024-11-27 NOTE — Group Note (Signed)
 Date:  11/27/2024 Time:  6:20 PM  Group Topic/Focus: Occupational Therapy    Pt did not attend occupational therapy group  Jazelyn Sipe R Darly Fails 11/27/2024, 6:20 PM

## 2024-11-27 NOTE — [facility] Group Notes (Signed)
 Adult Psychoeducational Group Note  Date:  11/26/2024 Time:  2000  Group Topic/Focus:  Support Group/ Narcotics Anonymous   Participation Level:  Did Not Attend

## 2024-11-27 NOTE — Plan of Care (Signed)
   Problem: Safety: Goal: Periods of time without injury will increase Outcome: Progressing

## 2024-11-28 NOTE — Group Note (Signed)
 Date:  11/28/2024 Time:  4:51 PM  Group Topic/Focus:  Self Care:   The focus of this group is to help patients understand the importance of self-care and sleep hygiene in order to improve or restore emotional, physical, spiritual, interpersonal, and financial health.    Additional Comments:  patient did not attend   Christina Mcbride 11/28/2024, 4:51 PM

## 2024-11-28 NOTE — [facility] Group Notes (Signed)
 [facility] Group Notes:  (Nursing/MHT/Case Management/Adjunct)  Date:  11/28/2024  Time:  2000  Type of Therapy:  Alcoholics Anonymous Meeting  Participation Level:  Active  Participation Quality:  Appropriate, Attentive, and Supportive  Affect:  Appropriate  Cognitive:  Alert  Insight:  Improving  Engagement in Group:  Engaged  Modes of Intervention:  Clarification, Education, and Support  Summary of Progress/Problems:  Lenora Manuelita RAMAN 11/28/2024, 8:54 PM

## 2024-11-28 NOTE — Group Note (Signed)
 Date:  11/28/2024 Time:  1:16 PM  Group Topic/Focus: Social Wellness  This social wellness group focused on the theme of control, including how it feels to have control, lose control, and share control with others. Patients sat in a circle and began with a blank piece of paper. Each patient was asked to draw one thing, after which a timer was set for one and a half minutes. When the timer went off, patients passed their papers to the left and continued adding to the drawings they received. Patients were encouraged to be creative and adapt to the existing drawings as they felt appropriate. At the conclusion of the activity, patients shared their completed drawings and discussed their experiences, emotions, and interpretations of the activity. The group processed how the exercise related to control in daily life and how using positive coping skills can help shift perspectives and responses to situations involving control.    Participation Level:  Active  Participation Quality:  Appropriate, Attentive, Sharing, and Supportive  Affect:  Appropriate  Cognitive:  Alert and Appropriate  Insight: Good and Improving  Engagement in Group:  Engaged and Improving  Modes of Intervention:  Activity, Discussion, Socialization, and Support  Additional Comments:  Pt attended and actively participated in this wellness group  Kristi HERO [facility] 11/28/2024, 1:16 PM

## 2024-11-28 NOTE — Group Note (Signed)
 Date:  11/28/2024 Time:  2:12 PM  Group Topic/Focus: Music Wellness Group Patients participated in a guess the song activity using appropriate music played from the MHTs phone. This challenge promoted critical thinking and memory recall while encouraging active participation. Patients were engaged through singing along, listening closely, and interacting with peers, which supported socialization, mood elevation, and overall cognitive engagement.    Participation Level:  Did Not Attend  Additional Comments:  Pt did not attend this group  Kristi HERO [facility] 11/28/2024, 2:12 PM

## 2024-11-28 NOTE — Group Note (Signed)
 Recreation Therapy Group Note   Group Topic:General Recreation  Group Date: 11/28/2024 Start Time: 9062 End Time: 1025 Facilitators: Ariel Wingrove-McCall, LRT,CTRS Location: [address] Dayroom   Group Topic/Focus: General Recreation   Goal Area(s) Addresses:  Patient will use appropriate interactions in play with peers.   Patient will follow directions on first prompt.  Behavioral Response:   Intervention: Play and Mental Exercise  Activity: Patients were allowed free play during recreation therapy group session today. Patients had to option of doing chess, puzzles or trivia game Guess the Lyric.    Affect/Mood: N/A   Participation Level: Did not attend    Clinical Observations/Individualized Feedback:      Plan: Continue to engage patient in RT group sessions 2-3x/week.   Emett Stapel-McCall, LRT,CTRS 11/28/2024 12:22 PM

## 2024-11-28 NOTE — Group Note (Signed)
 Date:  11/28/2024 Time:  10:01 AM  Group Topic/Focus: Goals Group  Patients were invited to introduce themselves using an icebreaker question about their New Year's resolutions. Following this, patients received a goals worksheet designed to help them outline their objectives for the upcoming week, month, and year. Participants were encouraged to share their goals with the group, along with any obstacles they anticipate facing. The group setting provided a safe and supportive space, fostering an environment where patients felt comfortable expressing themselves and engaging in open discussion.    Participation Level:  Did Not Attend    Additional Comments:  Pt did not attend goals group  Kristi HERO [facility] 11/28/2024, 10:01 AM

## 2024-11-28 NOTE — Plan of Care (Signed)
  Problem: Education: Goal: Emotional status will improve Outcome: Progressing Goal: Verbalization of understanding the information provided will improve Outcome: Progressing   Problem: Activity: Goal: Interest or engagement in activities will improve Outcome: Progressing   Problem: Coping: Goal: Ability to demonstrate self-control will improve Outcome: Progressing   Problem: Health Behavior/Discharge Planning: Goal: Compliance with treatment plan for underlying cause of condition will improve Outcome: Progressing   

## 2024-11-28 NOTE — Progress Notes (Signed)
" °   11/28/24 1000  Psych Admission Type (Psych Patients Only)  Admission Status Voluntary  Psychosocial Assessment  Patient Complaints Anxiety  Eye Contact Fair  Facial Expression Anxious;Animated  Affect Anxious  Speech Logical/coherent  Interaction Assertive;Cautious  Motor Activity Slow  Appearance/Hygiene Unremarkable  Behavior Characteristics Cooperative  Mood Pleasant;Anxious;Depressed  Thought Process  Coherency WDL  Content WDL  Delusions None reported or observed  Perception WDL  Hallucination None reported or observed  Judgment Impaired  Confusion None  Danger to Self  Current suicidal ideation? Denies  Agreement Not to Harm Self Yes  Description of Agreement verbal  Danger to Others  Danger to Others None reported or observed    "

## 2024-11-28 NOTE — Progress Notes (Addendum)
 [facility] MD Progress Note  11/28/2024 9:54 AM Christina Mcbride  MRN:  [id number] Subjective:   Christina Mcbride is a 54 yr old female who presented on 12/29 to [facility] with SI with worsening depression and multiple stressors, he was admitted to [facility] on 12/30. PPHx is significant for Depression, Anxiety, PTSD, Prior Suicide Attempts (last 2002), a remote History of Self Injurious Behavior (cutting- high school), and 1 Prior Psychiatric Hospitalization (2002).    Case was discussed in the multidisciplinary team. MAR was reviewed and patient was compliant with medications.  She received PRN Hydroxyzine  x3 and Trazodone  yesterday.   Psychiatric Team made the following recommendations yesterday: -Continue Zoloft  50 mg for depression and anxiety -Continue Abilify  2 mg daily for augmentation  -Continue Propanolol 10 mg BID for anxiety and BP -Start Amlodipine  5 mg daily    On interview today patient reports she slept good last night.  She reports her appetite is doing good.  She reports no SI, HI, or AVH.  She reports no Paranoia or Ideas of Reference.  She reports no issues with her medications.  She reports that her depression has been improving but her anxiety continues to be high, reporting she became overwhelmed in the cafeteria this morning.  Discussed with her that we would continue with the planned increase in Zoloft  and she was agreeable.  Discussed with her that her blood pressure is still elevated and we would continue to monitor it and may need to further increase the Amlodipine  and she reported understanding.  She reports a headache yesterday but none today.  She reports no other concerns at present.    Principal Problem: MDD (major depressive disorder), recurrent severe, without psychosis (HCC) Diagnosis: Principal Problem:   MDD (major depressive disorder), recurrent severe, without psychosis (HCC) Active Problems:   Hypertension  Total Time spent with patient:  I personally spent 35  minutes on the unit in direct patient care. The direct patient care time included face-to-face time with the patient, reviewing the patient's chart, communicating with other professionals, and coordinating care.    Past Psychiatric History:  Depression, Anxiety, PTSD, Prior Suicide Attempts (last 2002), a remote History of Self Injurious Behavior (cutting- high school), and 1 Prior Psychiatric Hospitalization (2002).   Past Medical History:  Past Medical History:  Diagnosis Date   Amenia 11/2018   Blood transfusion without reported diagnosis    COVID-19 virus infection - May 2021 04/15/2020   History of hiatal hernia    Migraine    Orthostatic hypotension    PTSD (post-traumatic stress disorder)    reports daughter was murdered    Tachycardia    Thyroid  disease    Vitamin D  deficiency     Past Surgical History:  Procedure Laterality Date   BIOPSY  10/17/2019   Procedure: BIOPSY;  Surgeon: Legrand Victory LITTIE DOUGLAS, MD;  Location: [facility] ENDOSCOPY;  Service: Gastroenterology;;   COLONOSCOPY WITH PROPOFOL  N/A 10/17/2019   Procedure: COLONOSCOPY WITH PROPOFOL ;  Surgeon: Legrand Victory LITTIE DOUGLAS, MD;  Location: [facility] ENDOSCOPY;  Service: Gastroenterology;  Laterality: N/A;   ESOPHAGOGASTRODUODENOSCOPY (EGD) WITH PROPOFOL  N/A 10/17/2019   Procedure: ESOPHAGOGASTRODUODENOSCOPY (EGD) WITH PROPOFOL ;  Surgeon: Legrand Victory LITTIE DOUGLAS, MD;  Location: [facility] ENDOSCOPY;  Service: Gastroenterology;  Laterality: N/A;   HERNIA REPAIR     TUBAL LIGATION     Family History:  Family History  Problem Relation Age of Onset   Hypertension Mother    Arthritis Mother    Alzheimer's disease Mother  Diabetes Maternal Grandmother    Hypertension Son    Cancer Maternal Aunt    Family Psychiatric  History:  Brother- Bipolar Disorder Uncle- EtOH Abuse No Known Suicides  Social History:  Social History   Substance and Sexual Activity  Alcohol Use No     Social History   Substance and Sexual Activity  Drug Use No     Social History   Socioeconomic History   Marital status: Single    Spouse name: Not on file   Number of children: 3   Years of education: Not on file   Highest education level: Not on file  Occupational History   Not on file  Tobacco Use   Smoking status: Never    Passive exposure: Never   Smokeless tobacco: Never  Vaping Use   Vaping status: Never Used  Substance and Sexual Activity   Alcohol use: No   Drug use: No   Sexual activity: Not Currently    Birth control/protection: Surgical  Other Topics Concern   Not on file  Social History Narrative   Pt relocated to [place] from [place] around 2012.      Lives in [place]; with significant other. 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    Social Drivers of Health   Tobacco Use: Low Risk (11/24/2024)   Patient History    Smoking Tobacco Use: Never    Smokeless Tobacco Use: Never    Passive Exposure: Never  Financial Resource Strain: Not on file  Food Insecurity: Food Insecurity Present (11/24/2024)   Epic    Worried About Programme Researcher, Broadcasting/film/video in the Last Year: Often true    Barista in the Last Year: Often true  Transportation Needs: Unmet Transportation Needs (11/24/2024)   Epic    Lack of Transportation (Medical): Yes    Lack of Transportation (Non-Medical): Yes  Physical Activity: Not on file  Stress: Not on file  Social Connections: Not on file  Depression (PHQ2-9): High Risk (11/24/2024)   Depression (PHQ2-9)    PHQ-2 Score: 21  Alcohol Screen: Not on file  Housing: High Risk (11/24/2024)   Epic    Unable to Pay for Housing in the Last Year: Yes    Number of Times Moved in the Last Year: 1    Homeless in the Last Year: Yes  Utilities: Not At Risk (11/24/2024)   Epic    Threatened with loss of utilities: No  Health Literacy: Not on file   Additional Social History:                         Sleep: Good Estimated Sleeping Duration (Last 24 Hours): 6.00-7.50  hours  Appetite:  Good  Current Medications: Current Facility-Administered Medications  Medication Dose Route Frequency Provider Last Rate Last Admin   acetaminophen  (TYLENOL ) tablet 650 mg  650 mg Oral Q6H PRN Motley-Mangrum, Jadeka A, PMHNP   650 mg at 11/26/24 1605   alum & mag hydroxide-simeth (MAALOX/MYLANTA) 200-200-20 MG/5ML suspension 30 mL  30 mL Oral Q4H PRN Motley-Mangrum, Jadeka A, PMHNP       amLODipine  (NORVASC ) tablet 5 mg  5 mg Oral Daily Shalise Rosado S, DO   5 mg at 11/28/24 9188   ARIPiprazole  (ABILIFY ) tablet 2 mg  2 mg Oral Daily Venda Dice S, DO   2 mg at 11/28/24 9188   atorvastatin  (LIPITOR) tablet 20 mg  20 mg Oral Daily Motley-Mangrum, Jadeka A, PMHNP  20 mg at 11/28/24 9188   butalbital -acetaminophen -caffeine  (FIORICET ) 50-325-40 MG per tablet 1-2 tablet  1-2 tablet Oral Q6H PRN Motley-Mangrum, Jadeka A, PMHNP       haloperidol  (HALDOL ) tablet 5 mg  5 mg Oral TID PRN Motley-Mangrum, Jadeka A, PMHNP       And   diphenhydrAMINE  (BENADRYL ) capsule 50 mg  50 mg Oral TID PRN Motley-Mangrum, Jadeka A, PMHNP       haloperidol  lactate (HALDOL ) injection 5 mg  5 mg Intramuscular TID PRN Motley-Mangrum, Jadeka A, PMHNP       And   diphenhydrAMINE  (BENADRYL ) injection 50 mg  50 mg Intramuscular TID PRN Motley-Mangrum, Jadeka A, PMHNP       And   LORazepam  (ATIVAN ) injection 2 mg  2 mg Intramuscular TID PRN Motley-Mangrum, Jadeka A, PMHNP       haloperidol  lactate (HALDOL ) injection 10 mg  10 mg Intramuscular TID PRN Motley-Mangrum, Jadeka A, PMHNP       And   diphenhydrAMINE  (BENADRYL ) injection 50 mg  50 mg Intramuscular TID PRN Motley-Mangrum, Jadeka A, PMHNP       And   LORazepam  (ATIVAN ) injection 2 mg  2 mg Intramuscular TID PRN Motley-Mangrum, Jadeka A, PMHNP       hydrOXYzine  (ATARAX ) tablet 25 mg  25 mg Oral TID PRN Motley-Mangrum, Jadeka A, PMHNP   25 mg at 11/27/24 2108   magnesium  hydroxide (MILK OF MAGNESIA) suspension 30 mL  30 mL Oral Daily PRN  Motley-Mangrum, Jadeka A, PMHNP       propranolol  (INDERAL ) tablet 10 mg  10 mg Oral BID Judith Demps S, DO   10 mg at 11/28/24 9188   sertraline  (ZOLOFT ) tablet 100 mg  100 mg Oral Daily Lancelot Alyea S, DO   100 mg at 11/28/24 9188   traZODone  (DESYREL ) tablet 50 mg  50 mg Oral QHS PRN Sirr Kabel S, DO   50 mg at 11/27/24 2109    Lab Results:  No results found for this or any previous visit (from the past 48 hours).   Blood Alcohol level:  Lab Results  Component Value Date   [facility] <15 11/24/2024    Metabolic Disorder Labs: Lab Results  Component Value Date   HGBA1C 6.0 (H) 11/26/2024   MPG 125.5 11/26/2024   MPG 102.54 11/30/2018   No results found for: PROLACTIN Lab Results  Component Value Date   CHOL 173 11/26/2024   TRIG 85 11/26/2024   HDL 44 11/26/2024   CHOLHDL 3.9 11/26/2024   VLDL 17 11/26/2024   LDLCALC 112 (H) 11/26/2024   LDLCALC 104 (H) 12/26/2013    Physical Findings: AIMS:  ,  ,  ,  ,  ,  ,   CIWA:    COWS:     Musculoskeletal: Strength & Muscle Tone: within normal limits Gait & Station: normal Patient leans: N/A  Psychiatric Specialty Exam:  Presentation  General Appearance:  Appropriate for Environment; Casual  Eye Contact: Fair  Speech: Clear and Coherent; Normal Rate  Speech Volume: Normal  Handedness:No data recorded  Mood and Affect  Mood: Anxious  Affect: Congruent   Thought Process  Thought Processes: Coherent; Goal Directed  Descriptions of Associations:Intact  Orientation:Full (Time, Place and Person)  Thought Content:Logical; WDL  History of Schizophrenia/Schizoaffective disorder:No data recorded Duration of Psychotic Symptoms:No data recorded Hallucinations:Hallucinations: None  Ideas of Reference:None  Suicidal Thoughts:Suicidal Thoughts: No  Homicidal Thoughts:Homicidal Thoughts: No   Sensorium  Memory: Immediate Fair; Recent  Fair  Judgment:  Fair  Insight: Corporate Treasurer: Fair  Attention Span: Fair  Recall: [employer] of Knowledge: Fair  Language: Good   Psychomotor Activity  Psychomotor Activity: Psychomotor Activity: Normal   Assets  Assets: Communication Skills; Desire for Improvement; Resilience   Sleep  Sleep: Sleep: Good    Physical Exam: Physical Exam Vitals and nursing note reviewed.  Constitutional:      General: She is not in acute distress.    Appearance: Normal appearance. She is obese. She is not ill-appearing or toxic-appearing.  HENT:     Head: Normocephalic and atraumatic.  Pulmonary:     Effort: Pulmonary effort is normal.  Musculoskeletal:        General: Normal range of motion.  Neurological:     General: No focal deficit present.     Mental Status: She is alert.    Review of Systems  Respiratory:  Negative for cough and shortness of breath.   Cardiovascular:  Negative for chest pain.  Gastrointestinal:  Negative for abdominal pain, constipation, diarrhea, nausea and vomiting.  Neurological:  Negative for dizziness, weakness and headaches.  Psychiatric/Behavioral:  Positive for depression. Negative for hallucinations and suicidal ideas. The patient is nervous/anxious.    Blood pressure (!) 151/96, pulse 83, temperature 98.7 F (37.1 C), temperature source Oral, resp. rate 20, weight 128.5 kg, last menstrual period 05/04/2016, SpO2 100%. Body mass index is 44.36 kg/m.   Treatment Plan Summary: Daily contact with patient to assess and evaluate symptoms and progress in treatment and Medication management  Christina Mcbride is a 54 yr old female who presented on 12/29 to [facility] with SI with worsening depression and multiple stressors, he was admitted to [facility] on 12/30.  PPHx is significant for Depression, Anxiety, PTSD, Prior Suicide Attempts (last 2002), a remote History of Self Injurious Behavior (cutting- high school), and 1  Prior Psychiatric Hospitalization (2002).     Triniti tolerated starting the Amlodipine  but her blood pressure does continue to be elevated.  She continues to be overwhelmed when her anxiety increases so we will continue with the planned increase in her Zoloft  today.  We will continue to monitor.      MDD, Recurrent, Severe, w/out Psychosis  GAD  PTSD:  -Increase Zoloft  to 100 mg for depression and anxiety -Continue Abilify  2 mg daily for augmentation  -Continue Propanolol 10 mg BID for anxiety and BP -Continue Agitation Protocol: Haldol /Ativan /Benadryl     HTN: -Continue Amlodipine  5 mg daily    -Continue Atorvastatin  20 mg daily Continue Fioricet  50-325-40 mg 1-2 tablets q6 PRN headache -Continue PRN's: Tylenol , Maalox, Atarax , Milk of Magnesia, Trazodone    --  The risks/benefits/side-effects/alternatives to medications were discussed in detail with the patient and time was given for questions. The patient consents to medication trials.                -- Metabolic profile and EKG monitoring obtained while on an atypical antipsychotic (BMI: 44.36, Lipid Panel: WNL except LDL: 112, HbgA1c: Pending, EKG: NSR w/ sinus arrhythmia w/QTc: 457)              -- Encouraged patient to participate in unit milieu and in scheduled group therapies              -- Short Term Goals: Ability to identify changes in lifestyle to reduce recurrence of condition will improve, Ability to verbalize feelings will improve, Ability to disclose and discuss suicidal ideas, Ability to demonstrate self-control will improve,  Ability to identify and develop effective coping behaviors will improve, Ability to maintain clinical measurements within normal limits will improve, Compliance with prescribed medications will improve, and Ability to identify triggers associated with substance abuse/mental health issues will improve             -- Long Term Goals: Improvement in symptoms so as ready for discharge   Safety and  Monitoring:             -- Voluntary admission to inpatient psychiatric unit for safety, stabilization and treatment             -- Daily contact with patient to assess and evaluate symptoms and progress in treatment             -- Patient's case to be discussed in multi-disciplinary team meeting             -- Observation Level : q15 minute checks             -- Vital signs:  q12 hours             -- Precautions: suicide, elopement, and assault  Discharge Planning:              -- Social work and case management to assist with discharge planning and identification of hospital follow-up needs prior to discharge             -- Estimated LOS: 2-4 more days             -- Discharge Concerns: Need to establish a safety plan; Medication compliance and effectiveness             -- Discharge Goals: Return home with outpatient referrals for mental health follow-up including medication management/psychotherapy   Marsa GORMAN Rosser, DO 11/28/2024, 9:54 AM

## 2024-11-29 NOTE — Progress Notes (Signed)
 [facility] MD Progress Note  11/29/2024 1:52 PM Christina Mcbride  MRN:  [id number] Subjective:   Christina Mcbride is a 54 yr old female who presented on 12/29 to [facility] with SI with worsening depression and multiple stressors, he was admitted to [facility] on 12/30. PPHx is significant for Depression, Anxiety, PTSD, Prior Suicide Attempts (last 2002), a remote History of Self Injurious Behavior (cutting- high school), and 1 Prior Psychiatric Hospitalization (2002).    Case was discussed in the multidisciplinary team. MAR was reviewed and patient was compliant with medications.  She received no PRN medications yesterday.   Psychiatric Team made the following recommendations yesterday: -Increase Zoloft  to 100 mg for depression and anxiety -Continue Abilify  2 mg daily for augmentation  -Continue Propanolol 10 mg BID for anxiety and BP -Continue Amlodipine  5 mg daily    On interview today patient reports she slept good last night.  She reports her appetite is doing good.  She reports no SI, HI, or AVH.  She reports no Paranoia or Ideas of Reference.  She reports no issues with her medications.  She reports that she is feeling an improvement in her mood.  She reports she is not as anxious anymore and has been able to smile and laugh more.  She reports she has been attending groups without issue.  Discussed with her that since she is doing well we will plan for discharge tomorrow she is agreeable with this.  She reports no other concerns at present.    Principal Problem: MDD (major depressive disorder), recurrent severe, without psychosis (HCC) Diagnosis: Principal Problem:   MDD (major depressive disorder), recurrent severe, without psychosis (HCC) Active Problems:   Hypertension  Total Time spent with patient:  I personally spent 35 minutes on the unit in direct patient care. The direct patient care time included face-to-face time with the patient, reviewing the patient's chart, communicating with  other professionals, and coordinating care.    Past Psychiatric History:  Depression, Anxiety, PTSD, Prior Suicide Attempts (last 2002), a remote History of Self Injurious Behavior (cutting- high school), and 1 Prior Psychiatric Hospitalization (2002).   Past Medical History:  Past Medical History:  Diagnosis Date   Amenia 11/2018   Blood transfusion without reported diagnosis    COVID-19 virus infection - May 2021 04/15/2020   History of hiatal hernia    Migraine    Orthostatic hypotension    PTSD (post-traumatic stress disorder)    reports daughter was murdered    Tachycardia    Thyroid  disease    Vitamin D  deficiency     Past Surgical History:  Procedure Laterality Date   BIOPSY  10/17/2019   Procedure: BIOPSY;  Surgeon: Legrand Victory LITTIE DOUGLAS, MD;  Location: [facility] ENDOSCOPY;  Service: Gastroenterology;;   COLONOSCOPY WITH PROPOFOL  N/A 10/17/2019   Procedure: COLONOSCOPY WITH PROPOFOL ;  Surgeon: Legrand Victory LITTIE DOUGLAS, MD;  Location: [facility] ENDOSCOPY;  Service: Gastroenterology;  Laterality: N/A;   ESOPHAGOGASTRODUODENOSCOPY (EGD) WITH PROPOFOL  N/A 10/17/2019   Procedure: ESOPHAGOGASTRODUODENOSCOPY (EGD) WITH PROPOFOL ;  Surgeon: Legrand Victory LITTIE DOUGLAS, MD;  Location: [facility] ENDOSCOPY;  Service: Gastroenterology;  Laterality: N/A;   HERNIA REPAIR     TUBAL LIGATION     Family History:  Family History  Problem Relation Age of Onset   Hypertension Mother    Arthritis Mother    Alzheimer's disease Mother    Diabetes Maternal Grandmother    Hypertension Son    Cancer Maternal Aunt    Family Psychiatric  History:  Brother- Bipolar Disorder Uncle- EtOH Abuse No Known Suicides  Social History:  Social History   Substance and Sexual Activity  Alcohol Use No     Social History   Substance and Sexual Activity  Drug Use No    Social History   Socioeconomic History   Marital status: Single    Spouse name: Not on file   Number of children: 3   Years of education: Not on file   Highest  education level: Not on file  Occupational History   Not on file  Tobacco Use   Smoking status: Never    Passive exposure: Never   Smokeless tobacco: Never  Vaping Use   Vaping status: Never Used  Substance and Sexual Activity   Alcohol use: No   Drug use: No   Sexual activity: Not Currently    Birth control/protection: Surgical  Other Topics Concern   Not on file  Social History Narrative   Pt relocated to [place] from [place] around 2012.      Lives in [place]; with significant other. 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    Social Drivers of Health   Tobacco Use: Low Risk (11/24/2024)   Patient History    Smoking Tobacco Use: Never    Smokeless Tobacco Use: Never    Passive Exposure: Never  Financial Resource Strain: Not on file  Food Insecurity: Food Insecurity Present (11/24/2024)   Epic    Worried About Programme Researcher, Broadcasting/film/video in the Last Year: Often true    Barista in the Last Year: Often true  Transportation Needs: Unmet Transportation Needs (11/24/2024)   Epic    Lack of Transportation (Medical): Yes    Lack of Transportation (Non-Medical): Yes  Physical Activity: Not on file  Stress: Not on file  Social Connections: Not on file  Depression (PHQ2-9): High Risk (11/24/2024)   Depression (PHQ2-9)    PHQ-2 Score: 21  Alcohol Screen: Not on file  Housing: High Risk (11/24/2024)   Epic    Unable to Pay for Housing in the Last Year: Yes    Number of Times Moved in the Last Year: 1    Homeless in the Last Year: Yes  Utilities: Not At Risk (11/24/2024)   Epic    Threatened with loss of utilities: No  Health Literacy: Not on file   Additional Social History:                         Sleep: Good Estimated Sleeping Duration (Last 24 Hours): 4.50-5.25 hours  Appetite:  Good  Current Medications: Current Facility-Administered Medications  Medication Dose Route Frequency Provider Last Rate Last Admin   acetaminophen   (TYLENOL ) tablet 650 mg  650 mg Oral Q6H PRN Motley-Mangrum, Jadeka A, PMHNP   650 mg at 11/26/24 1605   alum & mag hydroxide-simeth (MAALOX/MYLANTA) 200-200-20 MG/5ML suspension 30 mL  30 mL Oral Q4H PRN Motley-Mangrum, Jadeka A, PMHNP       amLODipine  (NORVASC ) tablet 5 mg  5 mg Oral Daily Liza Czerwinski S, DO   5 mg at 11/29/24 9191   ARIPiprazole  (ABILIFY ) tablet 2 mg  2 mg Oral Daily Kiernan Atkerson S, DO   2 mg at 11/29/24 9191   atorvastatin  (LIPITOR) tablet 20 mg  20 mg Oral Daily Motley-Mangrum, Jadeka A, PMHNP   20 mg at 11/29/24 0808   butalbital -acetaminophen -caffeine  (FIORICET ) 50-325-40 MG per tablet 1-2 tablet  1-2 tablet Oral Q6H  PRN Motley-Mangrum, Jadeka A, PMHNP       haloperidol  (HALDOL ) tablet 5 mg  5 mg Oral TID PRN Motley-Mangrum, Jadeka A, PMHNP       And   diphenhydrAMINE  (BENADRYL ) capsule 50 mg  50 mg Oral TID PRN Motley-Mangrum, Jadeka A, PMHNP       haloperidol  lactate (HALDOL ) injection 5 mg  5 mg Intramuscular TID PRN Motley-Mangrum, Jadeka A, PMHNP       And   diphenhydrAMINE  (BENADRYL ) injection 50 mg  50 mg Intramuscular TID PRN Motley-Mangrum, Jadeka A, PMHNP       And   LORazepam  (ATIVAN ) injection 2 mg  2 mg Intramuscular TID PRN Motley-Mangrum, Jadeka A, PMHNP       haloperidol  lactate (HALDOL ) injection 10 mg  10 mg Intramuscular TID PRN Motley-Mangrum, Jadeka A, PMHNP       And   diphenhydrAMINE  (BENADRYL ) injection 50 mg  50 mg Intramuscular TID PRN Motley-Mangrum, Jadeka A, PMHNP       And   LORazepam  (ATIVAN ) injection 2 mg  2 mg Intramuscular TID PRN Motley-Mangrum, Jadeka A, PMHNP       hydrOXYzine  (ATARAX ) tablet 25 mg  25 mg Oral TID PRN Motley-Mangrum, Jadeka A, PMHNP   25 mg at 11/27/24 2108   magnesium  hydroxide (MILK OF MAGNESIA) suspension 30 mL  30 mL Oral Daily PRN Motley-Mangrum, Jadeka A, PMHNP       propranolol  (INDERAL ) tablet 10 mg  10 mg Oral BID Kamsiyochukwu Spickler S, DO   10 mg at 11/29/24 9191   sertraline  (ZOLOFT ) tablet  100 mg  100 mg Oral Daily Dagon Budai S, DO   100 mg at 11/29/24 9191   traZODone  (DESYREL ) tablet 50 mg  50 mg Oral QHS PRN Lynnell Fiumara S, DO   50 mg at 11/27/24 2109    Lab Results:  No results found for this or any previous visit (from the past 48 hours).   Blood Alcohol level:  Lab Results  Component Value Date   [facility] <15 11/24/2024    Metabolic Disorder Labs: Lab Results  Component Value Date   HGBA1C 6.0 (H) 11/26/2024   MPG 125.5 11/26/2024   MPG 102.54 11/30/2018   No results found for: PROLACTIN Lab Results  Component Value Date   CHOL 173 11/26/2024   TRIG 85 11/26/2024   HDL 44 11/26/2024   CHOLHDL 3.9 11/26/2024   VLDL 17 11/26/2024   LDLCALC 112 (H) 11/26/2024   LDLCALC 104 (H) 12/26/2013    Physical Findings: AIMS:  ,  ,  ,  ,  ,  ,   CIWA:    COWS:     Musculoskeletal: Strength & Muscle Tone: within normal limits Gait & Station: normal Patient leans: N/A  Psychiatric Specialty Exam:  Presentation  General Appearance:  Appropriate for Environment; Casual  Eye Contact: Good  Speech: Clear and Coherent; Normal Rate  Speech Volume: Normal  Handedness:No data recorded  Mood and Affect  Mood: -- (better)  Affect: Congruent; Appropriate   Thought Process  Thought Processes: Coherent; Goal Directed  Descriptions of Associations:Intact  Orientation:Full (Time, Place and Person)  Thought Content:Logical; WDL  History of Schizophrenia/Schizoaffective disorder:No data recorded Duration of Psychotic Symptoms:No data recorded Hallucinations:Hallucinations: None  Ideas of Reference:None  Suicidal Thoughts:Suicidal Thoughts: No  Homicidal Thoughts:Homicidal Thoughts: No   Sensorium  Memory: Immediate Fair; Recent Fair  Judgment: Fair  Insight: Fair   Art Therapist  Concentration: Fair  Attention Span: Fair  Recall: Fair  Fund of Knowledge: Fair  Language: Good   Psychomotor  Activity  Psychomotor Activity: Psychomotor Activity: Normal   Assets  Assets: Communication Skills; Desire for Improvement; Resilience   Sleep  Sleep: Sleep: Good    Physical Exam: Physical Exam Vitals and nursing note reviewed.  Constitutional:      General: She is not in acute distress.    Appearance: Normal appearance. She is obese. She is not ill-appearing or toxic-appearing.  HENT:     Head: Normocephalic and atraumatic.  Pulmonary:     Effort: Pulmonary effort is normal.  Musculoskeletal:        General: Normal range of motion.  Neurological:     General: No focal deficit present.     Mental Status: She is alert.    Review of Systems  Respiratory:  Negative for cough and shortness of breath.   Cardiovascular:  Negative for chest pain.  Gastrointestinal:  Negative for abdominal pain, constipation, diarrhea, nausea and vomiting.  Neurological:  Negative for dizziness, weakness and headaches.  Psychiatric/Behavioral:  Negative for depression, hallucinations and suicidal ideas. The patient is not nervous/anxious.    Blood pressure (!) 147/88, pulse 90, temperature 98.2 F (36.8 C), temperature source Oral, resp. rate 16, weight 128.5 kg, last menstrual period 05/04/2016, SpO2 99%. Body mass index is 44.36 kg/m.   Treatment Plan Summary: Daily contact with patient to assess and evaluate symptoms and progress in treatment and Medication management  Christina Mcbride is a 54 yr old female who presented on 12/29 to [facility] with SI with worsening depression and multiple stressors, he was admitted to [facility] on 12/30.  PPHx is significant for Depression, Anxiety, PTSD, Prior Suicide Attempts (last 2002), a remote History of Self Injurious Behavior (cutting- high school), and 1 Prior Psychiatric Hospitalization (2002).     Christina Mcbride tolerated the increase in Zoloft .  She has responded well overall to her medications and is reporting overall improvement.  If she continues to do  well we will plan for discharge tomorrow.  We will not make any changes to her medications at this time.  We will continue to monitor.     MDD, Recurrent, Severe, w/out Psychosis  GAD  PTSD:  -Continue Zoloft  100 mg for depression and anxiety -Continue Abilify  2 mg daily for augmentation  -Continue Propanolol 10 mg BID for anxiety and BP -Continue Agitation Protocol: Haldol /Ativan /Benadryl     HTN: -Continue Amlodipine  5 mg daily    -Continue Atorvastatin  20 mg daily Continue Fioricet  50-325-40 mg 1-2 tablets q6 PRN headache -Continue PRN's: Tylenol , Maalox, Atarax , Milk of Magnesia, Trazodone    --  The risks/benefits/side-effects/alternatives to medications were discussed in detail with the patient and time was given for questions. The patient consents to medication trials.                -- Metabolic profile and EKG monitoring obtained while on an atypical antipsychotic (BMI: 44.36, Lipid Panel: WNL except LDL: 112, HbgA1c: Pending, EKG: NSR w/ sinus arrhythmia w/QTc: 457)              -- Encouraged patient to participate in unit milieu and in scheduled group therapies              -- Short Term Goals: Ability to identify changes in lifestyle to reduce recurrence of condition will improve, Ability to verbalize feelings will improve, Ability to disclose and discuss suicidal ideas, Ability to demonstrate self-control will improve, Ability to identify and develop effective coping behaviors will improve, Ability  to maintain clinical measurements within normal limits will improve, Compliance with prescribed medications will improve, and Ability to identify triggers associated with substance abuse/mental health issues will improve             -- Long Term Goals: Improvement in symptoms so as ready for discharge   Safety and Monitoring:             -- Voluntary admission to inpatient psychiatric unit for safety, stabilization and treatment             -- Daily contact with patient to assess  and evaluate symptoms and progress in treatment             -- Patient's case to be discussed in multi-disciplinary team meeting             -- Observation Level : q15 minute checks             -- Vital signs:  q12 hours             -- Precautions: suicide, elopement, and assault  Discharge Planning:              -- Social work and case management to assist with discharge planning and identification of hospital follow-up needs prior to discharge             -- Estimated LOS: 2-4 more days             -- Discharge Concerns: Need to establish a safety plan; Medication compliance and effectiveness             -- Discharge Goals: Return home with outpatient referrals for mental health follow-up including medication management/psychotherapy   Marsa GORMAN Rosser, DO 11/29/2024, 1:52 PM

## 2024-11-29 NOTE — [facility] Group Notes (Signed)
 [facility] Group Notes:  (Nursing/MHT/Case Management/Adjunct)  Date:  11/29/2024  Time:  8:40 PM  Type of Therapy:  Psychoeducational Skills  Participation Level:  Did Not Attend  Participation Quality:  Did not attend group  Affect:  Did not attend group   Cognitive:  Did not attend group  Insight:  None  Engagement in Group:  Did not attend group  Modes of Intervention:  Education  Summary of Progress/Problems: Patient did not attend group.   Christina Mcbride S 11/29/2024, 8:40 PM

## 2024-11-29 NOTE — Group Note (Signed)
 Date:  11/29/2024 Time:  11:45 AM  Group Topic/Focus: Social wellness    Mental health and social wellness are deeply linked: social wellness is about building and maintaining healthy, supportive relationships, which directly boosts mental health by providing emotional support, reducing stress, increasing self-esteem, and fostering a sense of belonging, while loneliness and poor social connections increase risks for anxiety and depression. Key to this are clear communication, setting boundaries, empathy, and actively connecting with others, creating a positive cycle for overall well-being   Participation Level:  Did Not Attend  \ Christina Mcbride 11/29/2024, 11:45 AM

## 2024-11-29 NOTE — Group Note (Signed)
 Date:  11/29/2024 Time:  6:27 PM  Group Topic/Focus:  Dimensions of Wellness:   The focus of this group is to introduce the topic of wellness and discuss the role each dimension of wellness plays in total health.  This group focused on identifying healthy boundaries.   Participation Level:  Active  Participation Quality:  Appropriate  Affect:  Appropriate  Cognitive:  Appropriate  Insight: Appropriate  Engagement in Group:  Engaged  Modes of Intervention:  Discussion  Annalee  Jolyn Deshmukh 11/29/2024, 6:27 PM

## 2024-11-29 NOTE — Progress Notes (Signed)
" °   11/29/24 0900  Psych Admission Type (Psych Patients Only)  Admission Status Voluntary  Psychosocial Assessment  Patient Complaints None  Eye Contact Fair  Facial Expression Animated  Affect Appropriate to circumstance  Speech Logical/coherent  Interaction Assertive  Motor Activity Slow  Appearance/Hygiene Unremarkable  Behavior Characteristics Cooperative  Mood Pleasant  Thought Process  Coherency WDL  Content WDL  Delusions None reported or observed  Perception WDL  Hallucination None reported or observed  Judgment Impaired  Confusion None  Danger to Self  Current suicidal ideation? Denies  Agreement Not to Harm Self Yes  Description of Agreement verbal  Danger to Others  Danger to Others None reported or observed    "

## 2024-11-29 NOTE — Plan of Care (Signed)
  Problem: Education: Goal: Emotional status will improve Outcome: Progressing Goal: Verbalization of understanding the information provided will improve Outcome: Progressing   Problem: Activity: Goal: Interest or engagement in activities will improve Outcome: Progressing   Problem: Coping: Goal: Ability to verbalize frustrations and anger appropriately will improve Outcome: Progressing

## 2024-11-29 NOTE — Group Note (Signed)
 Date:  11/29/2024 Time:  10:51 AM  Group Topic/Focus: Goals and orientation Goals Group:   The focus of this group is to help patients establish daily goals to achieve during treatment and discuss how the patient can incorporate goal setting into their daily lives to aide in recovery. Orientation:   The focus of this group is to educate the patient on the purpose and policies of crisis stabilization and provide a format to answer questions about their admission.  The group details unit policies and expectations of patients while admitted.    Participation Level:  Did Not Attend   Dolores CHRISTELLA Fredericks 11/29/2024, 10:51 AM

## 2024-11-29 NOTE — Progress Notes (Signed)
(  Sleep Hours) - 7.75 (Any PRNs that were needed, meds refused, or side effects to meds)- NA (Any disturbances and when (visitation, over night)- None (Concerns raised by the patient)- None (SI/HI/AVH)- Denied

## 2024-11-30 DIAGNOSIS — F411 Generalized anxiety disorder: Secondary | ICD-10-CM

## 2024-11-30 DIAGNOSIS — F4310 Post-traumatic stress disorder, unspecified: Secondary | ICD-10-CM

## 2024-11-30 DIAGNOSIS — F332 Major depressive disorder, recurrent severe without psychotic features: Principal | ICD-10-CM

## 2024-11-30 MED ORDER — SERTRALINE HCL 100 MG PO TABS: 100.0000 mg | ORAL_TABLET | Freq: Every day | ORAL | 0 refills | Status: DC

## 2024-11-30 MED ORDER — PROPRANOLOL HCL 10 MG PO TABS: 10.0000 mg | ORAL_TABLET | Freq: Two times a day (BID) | ORAL | 0 refills | Status: DC

## 2024-11-30 MED ORDER — AMLODIPINE BESYLATE 5 MG PO TABS: 5.0000 mg | ORAL_TABLET | Freq: Every day | ORAL | 0 refills | Status: AC

## 2024-11-30 MED ORDER — ARIPIPRAZOLE 2 MG PO TABS: 2.0000 mg | ORAL_TABLET | Freq: Every day | ORAL | 0 refills | Status: DC

## 2024-11-30 NOTE — Progress Notes (Signed)
(  Sleep Hours) -7.75 (Any PRNs that were needed, meds refused, or side effects to meds)- none (Any disturbances and when (visitation, over night)-none (Concerns raised by the patient)- none (SI/HI/AVH)-denies all

## 2024-11-30 NOTE — Plan of Care (Signed)
   Problem: Safety: Goal: Periods of time without injury will increase Outcome: Progressing

## 2024-11-30 NOTE — Plan of Care (Signed)
   Problem: Education: Goal: Knowledge of [facility] Education information/materials will improve Outcome: Progressing Goal: Emotional status will improve Outcome: Progressing Goal: Mental status will improve Outcome: Progressing Goal: Verbalization of understanding the information provided will improve Outcome: Progressing

## 2024-11-30 NOTE — Group Note (Signed)
 Date:  11/30/2024 Time:  9:24 AM  Group Topic/Focus: Goals group  Following an initial icebreaker and introductions, patients engaged in a structured goal-setting activity. Utilizing SMART goal worksheets, participants identified and disclosed personal objectives related to recovery maintenance and self-care strategies.    Participation Level:  Did Not Attend  Additional Comments:  Pt did not attend goals group  Kristi HERO [facility] 11/30/2024, 9:24 AM

## 2024-11-30 NOTE — Progress Notes (Signed)
 Patient discharged from [facility] on 11/30/24 at 10:00am. Patient denies SI, plan, and intention. Suicide safety plan completed, reviewed with this RN, given to the patient, and a copy in the chart. Patient denies HI/AVH upon discharge.  Patient is alert, oriented, and cooperative. RN provided patient with discharge paperwork and reviewed information with patient. Patient expressed that she understood all of the discharge instructions. Pt was satisfied with belongings returned to her from the locker and at bedside. Discharged patient to [facility] waiting room. Pt's husband awaiting patient in the [facility] waiting room.

## 2024-11-30 NOTE — Progress Notes (Addendum)
" °  [facility] Adult Case Management Discharge Plan :  Will you be returning to the same living situation after discharge:  Yes,  return home with husband. At discharge, do you have transportation home?: Yes,  husband to transport pt home today at 10am. Do you have the ability to pay for your medications: Yes,  via insurance  Release of information consent forms completed and in the chart;  Patient's signature needed at discharge.  Patient to Follow up at:  Follow-up Information     [facility] Behavioral Medicine at [facility] Follow up on 12/10/2024.   Specialty: Behavioral Health Why: You have an appointment for therapy services on 12/10/24 at 12:00 pm. Contact information: [address] [place]  [phone]        [facility] at [facility] Follow up on 12/10/2024.   Specialty: Behavioral Health Why: You have an appointment for medication management services on 12/10/24 at 3:30 pm, Virtual.  Please log in to your MyChart account at 3:25 pm. Contact information: [address] [place]  [phone]                Next level of care provider has access to [facility] Link:no  Safety Planning and Suicide Prevention discussed: Yes,  completed with patient-Arnetra. Pt accepted the Suicide Prevention brochure  Work note completed and provided to patient.    Has patient been referred to the Quitline?: Patient does not use tobacco/nicotine products  Patient has been referred for addiction treatment: No known substance use disorder.  [employer], LCSW 11/30/2024, 9:22 AM "

## 2024-11-30 NOTE — Discharge Summary (Signed)
 " Physician Discharge Summary Note  Patient:  Christina Mcbride is an 54 y.o., female MRN:  [id number] DOB:  Jun 26, 1971 Patient phone:  [phone] (home)  Patient address:   [address] [place] [address],  Total Time spent with patient: 20 minutes  Date of Admission:  11/24/2024 Date of Discharge: 11/30/2024  Reason for Admission:   Christina Mcbride is a 54 yr old female who presented on 12/29 to [facility] with SI with worsening depression and multiple stressors, he was admitted to [facility] on 12/30.  PPHx is significant for Depression, Anxiety, PTSD, Prior Suicide Attempts (last 2002), a remote History of Self Injurious Behavior (cutting- high school), and 1 Prior Psychiatric Hospitalization (2002).   She reports that she is had several significant stressors recently.  She reports that in May she lost her apartment and has been in an extended stay hotel since then.  She reports that sharing the small space with her 3 sons and significant other has been an issue adding to her stress.  She reports that her significant other had COVID and was unable to help with bills for a while.  She also reports that her significant other mother was sick and in the hospital on Christmas and so they spent a few days there.  She reports that all of this was just so overwhelming and it seemed like no one was willing to help her.  She reports that due to all of this she began to think everyone would be better without her around.  She reports that Sunday she just left her house and began walking not caring what happened and that that she ended up at the hospital.  Principal Problem: MDD (major depressive disorder), recurrent severe, without psychosis (HCC) Discharge Diagnoses: Principal Problem:   MDD (major depressive disorder), recurrent severe, without psychosis (HCC) Active Problems:   PTSD (post-traumatic stress disorder)   Hypertension   GAD (generalized anxiety disorder)   Past Psychiatric History:   Depression, Anxiety, PTSD, Prior Suicide Attempts (last 2002), a remote History of Self Injurious Behavior (cutting- high school), and 1 Prior Psychiatric Hospitalization (2002).   Past Medical History:  Past Medical History:  Diagnosis Date   Amenia 11/2018   Blood transfusion without reported diagnosis    COVID-19 virus infection - May 2021 04/15/2020   History of hiatal hernia    Migraine    Orthostatic hypotension    PTSD (post-traumatic stress disorder)    reports daughter was murdered    Tachycardia    Thyroid  disease    Vitamin D  deficiency     Past Surgical History:  Procedure Laterality Date   BIOPSY  10/17/2019   Procedure: BIOPSY;  Surgeon: Legrand Victory LITTIE DOUGLAS, MD;  Location: [facility] ENDOSCOPY;  Service: Gastroenterology;;   COLONOSCOPY WITH PROPOFOL  N/A 10/17/2019   Procedure: COLONOSCOPY WITH PROPOFOL ;  Surgeon: Legrand Victory LITTIE DOUGLAS, MD;  Location: [facility] ENDOSCOPY;  Service: Gastroenterology;  Laterality: N/A;   ESOPHAGOGASTRODUODENOSCOPY (EGD) WITH PROPOFOL  N/A 10/17/2019   Procedure: ESOPHAGOGASTRODUODENOSCOPY (EGD) WITH PROPOFOL ;  Surgeon: Legrand Victory LITTIE DOUGLAS, MD;  Location: [facility] ENDOSCOPY;  Service: Gastroenterology;  Laterality: N/A;   HERNIA REPAIR     TUBAL LIGATION     Family History:  Family History  Problem Relation Age of Onset   Hypertension Mother    Arthritis Mother    Alzheimer's disease Mother    Diabetes Maternal Grandmother    Hypertension Son    Cancer Maternal Aunt    Family Psychiatric  History:  Brother- Bipolar Disorder Uncle- EtOH Abuse No Known Suicides  Social History:  Social History   Substance and Sexual Activity  Alcohol Use No     Social History   Substance and Sexual Activity  Drug Use No    Social History   Socioeconomic History   Marital status: Single    Spouse name: Not on file   Number of children: 3   Years of education: Not on file   Highest education level: Not on file  Occupational History   Not on file  Tobacco  Use   Smoking status: Never    Passive exposure: Never   Smokeless tobacco: Never  Vaping Use   Vaping status: Never Used  Substance and Sexual Activity   Alcohol use: No   Drug use: No   Sexual activity: Not Currently    Birth control/protection: Surgical  Other Topics Concern   Not on file  Social History Narrative   Pt relocated to [place] from [place] around 2012.      Lives in [place]; with significant other. 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    Social Drivers of Health   Tobacco Use: Low Risk (11/24/2024)   Patient History    Smoking Tobacco Use: Never    Smokeless Tobacco Use: Never    Passive Exposure: Never  Financial Resource Strain: Not on file  Food Insecurity: Food Insecurity Present (11/24/2024)   Epic    Worried About Programme Researcher, Broadcasting/film/video in the Last Year: Often true    Barista in the Last Year: Often true  Transportation Needs: Unmet Transportation Needs (11/24/2024)   Epic    Lack of Transportation (Medical): Yes    Lack of Transportation (Non-Medical): Yes  Physical Activity: Not on file  Stress: Not on file  Social Connections: Not on file  Depression (PHQ2-9): High Risk (11/24/2024)   Depression (PHQ2-9)    PHQ-2 Score: 21  Alcohol Screen: Not on file  Housing: High Risk (11/24/2024)   Epic    Unable to Pay for Housing in the Last Year: Yes    Number of Times Moved in the Last Year: 1    Homeless in the Last Year: Yes  Utilities: Not At Risk (11/24/2024)   Epic    Threatened with loss of utilities: No  Health Literacy: Not on file    Hospital Course:   During the patient's hospitalization, patient had extensive initial psychiatric evaluation, and follow-up psychiatric evaluations every day.  Psychiatric diagnoses provided upon initial assessment: MDD (major depressive disorder), recurrent severe, without psychosis (HCC) Active Problems:   PTSD (post-traumatic stress disorder)   Hypertension   GAD  (generalized anxiety disorder)  Patient's psychiatric medications were adjusted on admission: Her Lexapro  was stopped. She was started on Abilify .   During the hospitalization, other adjustments were made to the patient's psychiatric medication regimen: She was started on Zoloft  and this was titrated. She was started on Propanolol for her anxiety. She was started on Amlodipine  for elevated BP.   Patient's care was discussed during the interdisciplinary team meeting every day during the hospitalization.  The patient is not having side effects to prescribed psychiatric medication.  Gradually, patient started adjusting to milieu. The patient was evaluated each day by a clinical provider to ascertain response to treatment. Improvement was noted by the patient's report of decreasing symptoms, improved sleep and appetite, affect, medication tolerance, behavior, and participation in unit programming.  Patient was asked each  day to complete a self inventory noting mood, mental status, pain, new symptoms, anxiety and concerns.   Symptoms were reported as significantly decreased or resolved completely by discharge.  The patient reports that their mood is stable.  The patient denied having suicidal thoughts for more than 48 hours prior to discharge.  Patient denies having homicidal thoughts.  Patient denies having auditory hallucinations.  Patient denies any visual hallucinations or other symptoms of psychosis.  The patient was motivated to continue taking medication with a goal of continued improvement in mental health.   The patient reports their target psychiatric symptoms of depression, anxiety, and SI responded well to the psychiatric medications, and the patient reports overall benefit other psychiatric hospitalization. Supportive psychotherapy was provided to the patient. The patient also participated in regular group therapy while hospitalized. Coping skills, problem solving as well as relaxation  therapies were also part of the unit programming.  Labs were reviewed with the patient, and abnormal results were discussed with the patient.  The patient is able to verbalize their individual safety plan to this provider.  # It is recommended to the patient to continue psychiatric medications as prescribed, after discharge from the hospital.    # It is recommended to the patient to follow up with your outpatient psychiatric provider and PCP.  # It was discussed with the patient, the impact of alcohol, drugs, tobacco have been there overall psychiatric and medical wellbeing, and total abstinence from substance use was recommended the patient.ed.  # Prescriptions provided or sent directly to preferred pharmacy at discharge. Patient agreeable to plan. Given opportunity to ask questions. Appears to feel comfortable with discharge.    # In the event of worsening symptoms, the patient is instructed to call the crisis hotline, 911 and or go to the nearest ED for appropriate evaluation and treatment of symptoms. To follow-up with primary care provider for other medical issues, concerns and or health care needs  # Patient was discharged home with a plan to follow up as noted below.    On day of discharge she reports feeling much better.  She reports that she has been able to smile and feel happy again which has not happened in a while.  She reports no side effects to her medications.  She reports her sleep is good.  She reports her appetite is doing good.  She reports no SI, HI, or AVH.  Discussed with her the need to follow-up with her PCP about her blood pressure and she reported understanding.  Discussed with her the importance of taking her medications as prescribed and attending her follow up appointments and she reported understanding.  Discussed with her what to do in the event of a future crisis.  Discussed that she can go to [facility], go to the nearest ED, or call 911 or 988.   She reported  understanding and had no concerns.  She was discharged home with her boyfriend.   Physical Findings: AIMS: Facial and Oral Movements Muscles of Facial Expression: None Lips and Perioral Area: None Jaw: None Tongue: None,Extremity Movements Upper (arms, wrists, hands, fingers): None Lower (legs, knees, ankles, toes): None, Trunk Movements Neck, shoulders, hips: None, Global Judgements Severity of abnormal movements overall : None Incapacitation due to abnormal movements: None Patient's awareness of abnormal movements: No Awareness,  ,  , AIMS Total Score AIMS Total Score: 0 CIWA:    COWS:     Musculoskeletal: Strength & Muscle Tone: within normal limits Gait & Station:  normal Patient leans: N/A   Psychiatric Specialty Exam:  Presentation  General Appearance:  Appropriate for Environment; Casual  Eye Contact: Good  Speech: Clear and Coherent; Normal Rate  Speech Volume: Normal  Handedness:No data recorded  Mood and Affect  Mood: -- (happy)  Affect: Congruent; Appropriate   Thought Process  Thought Processes: Coherent; Goal Directed  Descriptions of Associations:Intact  Orientation:Full (Time, Place and Person)  Thought Content:Logical; WDL  History of Schizophrenia/Schizoaffective disorder:No data recorded Duration of Psychotic Symptoms:No data recorded Hallucinations:Hallucinations: None  Ideas of Reference:None  Suicidal Thoughts:Suicidal Thoughts: No  Homicidal Thoughts:Homicidal Thoughts: No   Sensorium  Memory: Immediate Fair; Recent Fair  Judgment: Good  Insight: Good   Executive Functions  Concentration: Good  Attention Span: Good  Recall: Good  Fund of Knowledge: Good  Language: Good   Psychomotor Activity  Psychomotor Activity: Psychomotor Activity: Normal   Assets  Assets: Communication Skills; Desire for Improvement; Resilience   Sleep  Sleep: Sleep: Good  Estimated Sleeping Duration (Last 24  Hours): 7.25 hours   Physical Exam: Physical Exam Vitals and nursing note reviewed.  Constitutional:      General: She is not in acute distress.    Appearance: Normal appearance. She is obese. She is not ill-appearing or toxic-appearing.  HENT:     Head: Normocephalic and atraumatic.  Pulmonary:     Effort: Pulmonary effort is normal.  Musculoskeletal:        General: Normal range of motion.  Neurological:     General: No focal deficit present.     Mental Status: She is alert.    Review of Systems  Respiratory:  Negative for cough and shortness of breath.   Cardiovascular:  Negative for chest pain.  Gastrointestinal:  Negative for abdominal pain, constipation, diarrhea, nausea and vomiting.  Neurological:  Negative for dizziness, weakness and headaches.  Psychiatric/Behavioral:  Negative for depression, hallucinations and suicidal ideas. The patient is not nervous/anxious.    Blood pressure (!) 144/85, pulse (!) 110, temperature 98.2 F (36.8 C), temperature source Oral, resp. rate 16, weight 128.5 kg, last menstrual period 05/04/2016, SpO2 98%. Body mass index is 44.36 kg/m.   Tobacco Use History[1] Tobacco Cessation:  N/A, patient does not currently use tobacco products   Blood Alcohol level:  Lab Results  Component Value Date   [facility] <15 11/24/2024    Metabolic Disorder Labs:  Lab Results  Component Value Date   HGBA1C 6.0 (H) 11/26/2024   MPG 125.5 11/26/2024   MPG 102.54 11/30/2018   No results found for: PROLACTIN Lab Results  Component Value Date   CHOL 173 11/26/2024   TRIG 85 11/26/2024   HDL 44 11/26/2024   CHOLHDL 3.9 11/26/2024   VLDL 17 11/26/2024   LDLCALC 112 (H) 11/26/2024   LDLCALC 104 (H) 12/26/2013    See Psychiatric Specialty Exam and Suicide Risk Assessment completed by Attending Physician prior to discharge.  Discharge destination:  Home  Is patient on multiple antipsychotic therapies at discharge:  No   Has Patient had three or  more failed trials of antipsychotic monotherapy by history:  No  Recommended Plan for Multiple Antipsychotic Therapies: NA  Discharge Instructions     Increase activity slowly   Complete by: As directed       Allergies as of 11/30/2024       Reactions   Bee Venom Anaphylaxis   Cat Dander         Medication List     STOP taking these  medications    escitalopram  10 MG tablet Commonly known as: LEXAPRO    methocarbamol  500 MG tablet Commonly known as: ROBAXIN        TAKE these medications      Indication  amLODipine  5 MG tablet Commonly known as: NORVASC  Take 1 tablet (5 mg total) by mouth daily. Start taking on: December 01, 2024  Indication: High Blood Pressure   ARIPiprazole  2 MG tablet Commonly known as: ABILIFY  Take 1 tablet (2 mg total) by mouth daily. Start taking on: December 01, 2024  Indication: Major Depressive Disorder   atorvastatin  20 MG tablet Commonly known as: LIPITOR TAKE 1 TABLET(20 MG) BY MOUTH DAILY  Indication: High Amount of Fats in the Blood   butalbital -acetaminophen -caffeine  50-325-40 MG tablet Commonly known as: FIORICET  Take 1-2 tablets by mouth every 6 (six) hours as needed for headache.  Indication: Tension Headache   ferrous sulfate  325 (65 FE) MG tablet Take 1 tablet (325 mg total) by mouth 2 (two) times daily with a meal. What changed: when to take this  Indication: Anemia From Inadequate Iron  in the Body   Ozempic  (1 MG/DOSE) 4 MG/3ML Sopn Generic drug: Semaglutide  (1 MG/DOSE) INJCT 1 MG UNDER THE SKIN ONCE A WEEK AS DIRECTED  Indication: Type 2 Diabetes   propranolol  10 MG tablet Commonly known as: INDERAL  Take 1 tablet (10 mg total) by mouth 2 (two) times daily.  Indication: Feeling Anxious   sertraline  100 MG tablet Commonly known as: ZOLOFT  Take 1 tablet (100 mg total) by mouth daily. Start taking on: December 01, 2024  Indication: Generalized Anxiety Disorder, Major Depressive Disorder, Posttraumatic Stress  Disorder        Follow-up Information     [facility] Follow up on 12/10/2024.   Specialty: Behavioral Health Why: You have an appointment for therapy services on 12/10/24 at 12:00 pm. Contact information: [address] [place]  [address] [phone]        [facility] at [facility] Follow up on 12/10/2024.   Specialty: Behavioral Health Why: You have an appointment for medication management services on 12/10/24 at 3:30 pm, Virtual.  Please log in to your MyChart account at 3:25 pm. Contact information: [address] [place]  [address] [phone]                Follow-up recommendations/Comments:   Activity: as tolerated   Diet: heart healthy   Other: -Follow-up with your outpatient psychiatric provider -instructions on appointment date, time, and address (location) are provided to you in discharge paperwork.   -Take your psychiatric medications as prescribed at discharge - instructions are provided to you in the discharge paperwork   -Follow-up with outpatient primary care doctor and other specialists -for management of chronic medical disease, including: Routine Care.  Follow up with your PCP about your blood pressure and further treatment.    -Testing: Follow-up with outpatient provider for abnormal lab results: Elevated A1c.  Elevated Lipid Panel.   -Recommend abstinence from alcohol, tobacco, and other illicit drug use at discharge.    -If your psychiatric symptoms recur, worsen, or if you have side effects to your psychiatric medications, call your outpatient psychiatric provider, 911, 988 or go to the nearest emergency department.   -If suicidal thoughts recur, call your outpatient psychiatric provider, 911, 988 or go to the nearest emergency department.  Signed: Marsa GORMAN Rosser, DO 11/30/2024, 9:05  AM           [  1]  Social History Tobacco Use  Smoking Status Never   Passive exposure: Never  Smokeless Tobacco Never   "
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
## 2024-11-30 NOTE — Progress Notes (Signed)
" °   11/30/24 0843  Psych Admission Type (Psych Patients Only)  Admission Status Voluntary  Psychosocial Assessment  Patient Complaints Anxiety;Depression  Eye Contact Fair  Facial Expression Animated  Affect Appropriate to circumstance  Speech Logical/coherent  Interaction Assertive  Motor Activity Other (Comment) (WDL)  Appearance/Hygiene Unremarkable  Behavior Characteristics Cooperative;Appropriate to situation  Mood Depressed;Anxious  Thought Process  Coherency WDL  Content WDL  Delusions None reported or observed  Perception WDL  Hallucination None reported or observed  Judgment Impaired  Confusion None  Danger to Self  Current suicidal ideation? Denies  Agreement Not to Harm Self Yes  Description of Agreement Verbal  Danger to Others  Danger to Others None reported or observed    "

## 2024-12-02 ENCOUNTER — Telehealth: Payer: Self-pay | Admitting: Family Medicine

## 2024-12-02 ENCOUNTER — Encounter: Payer: Self-pay | Admitting: Internal Medicine

## 2024-12-02 NOTE — Telephone Encounter (Signed)
 Copied from CRM #[id number]. Topic: Clinical - Prescription Issue >> Dec 02, 2024  4:28 PM Christina Mcbride wrote: Reason for CRM: Patient called because her medications are currently on hold. Dr. Marsa Rosser with [facility] prescribed sertraline  (Zoloft ) 100 mg, aripiprazole  (Abilify ) 2 mg, and amlodipine  (Norvasc ) 5 mg. The provider  at [facility] recently made changes to her medications, and the patient is waiting for a provider(Dr. Colette) to reach out regarding these changes. Patient has an appt with Dr. Colette on 12/15/24. Patient can be reached at [phone]

## 2024-12-03 NOTE — Telephone Encounter (Signed)
 Patient return call and would like a call back; patient will be available anytime after 2:00 pm.

## 2024-12-03 NOTE — Telephone Encounter (Signed)
 Attempted to contact patient for further clarification but unfortunately was sent to VM once again. I have left a second voice message requesting a call back to our office and also advised pt is able to send a MyChart message if that is an option.

## 2024-12-03 NOTE — Telephone Encounter (Signed)
 Attempted to contact patient for further clarification but unfortunately was sent to VM. I have left pt a voice message requesting a call back to our office at their earliest convenience.

## 2024-12-10 ENCOUNTER — Ambulatory Visit (HOSPITAL_COMMUNITY): Admitting: Registered Nurse

## 2024-12-10 ENCOUNTER — Ambulatory Visit: Admitting: Professional

## 2024-12-10 ENCOUNTER — Encounter (HOSPITAL_COMMUNITY): Payer: Self-pay

## 2024-12-12 ENCOUNTER — Encounter: Payer: Self-pay | Admitting: Family Medicine

## 2024-12-12 ENCOUNTER — Ambulatory Visit: Admitting: Family Medicine

## 2024-12-12 VITALS — BP 138/83 | HR 90 | Temp 98.6°F | Ht 67.0 in | Wt 278.0 lb

## 2024-12-12 DIAGNOSIS — F332 Major depressive disorder, recurrent severe without psychotic features: Secondary | ICD-10-CM

## 2024-12-12 DIAGNOSIS — Z09 Encounter for follow-up examination after completed treatment for conditions other than malignant neoplasm: Secondary | ICD-10-CM

## 2024-12-12 DIAGNOSIS — F4310 Post-traumatic stress disorder, unspecified: Secondary | ICD-10-CM

## 2024-12-12 DIAGNOSIS — Z23 Encounter for immunization: Secondary | ICD-10-CM

## 2024-12-12 DIAGNOSIS — Z6841 Body Mass Index (BMI) 40.0 and over, adult: Secondary | ICD-10-CM

## 2024-12-12 DIAGNOSIS — R7303 Prediabetes: Secondary | ICD-10-CM

## 2024-12-12 MED ORDER — OZEMPIC (1 MG/DOSE) 4 MG/3ML ~~LOC~~ SOPN: 1.0000 mg | PEN_INJECTOR | SUBCUTANEOUS | 0 refills | Status: AC

## 2024-12-12 NOTE — Progress Notes (Signed)
 "  Established Patient Office Visit  Subjective   Patient ID: Christina Mcbride, female    DOB: 1970-12-11  Age: 54 y.o. MRN: [id number]  Chief Complaint  Patient presents with   Hospitalization Follow-up    Suicidal Intents     Pain    Hip / knee pain     HPI  Hospital follow up  Pt went to Behavioral health hospital on 12/29 and stayed until 1/4 due to feeling overwhelmed. She found out her b/f mother was sick and she passed yesterday. She says that week was stressful. She says a lot was happening. She lost her home and had to live in a hotel. She felt like she felt down on herself and let her family down. She was started on Abilify  2mg  and Zoloft  100mg  daily. She needs referral to psychiatry for continued medicine management. She has her counseling sessions booked at [facility].   She was diagnosed with HTN and started on Amlodipine  5mg  daily.  She was on Ozempic  1mg  weekly. Last A1c 6.0 in December. Losing weight on this dose and tolerating. Needs refilled.    Review of Systems  All other systems reviewed and are negative.     Objective:     BP 138/83   Pulse 90   Temp 98.6 F (37 C)   Ht 5' 7 (1.702 m)   Wt 278 lb (126.1 kg)   LMP 05/04/2016   SpO2 100%   BMI 43.54 kg/m    Physical Exam Vitals and nursing note reviewed.  Constitutional:      Appearance: Normal appearance. She is obese.  HENT:     Head: Normocephalic and atraumatic.     Right Ear: External ear normal.     Left Ear: External ear normal.     Nose: Nose normal.     Mouth/Throat:     Mouth: Mucous membranes are moist.     Pharynx: Oropharynx is clear.  Eyes:     Conjunctiva/sclera: Conjunctivae normal.     Pupils: Pupils are equal, round, and reactive to light.  Cardiovascular:     Rate and Rhythm: Normal rate.  Pulmonary:     Effort: Pulmonary effort is normal.  Abdominal:     General: Abdomen is flat. Bowel sounds are normal.  Skin:    General: Skin is warm.      Capillary Refill: Capillary refill takes less than 2 seconds.  Neurological:     General: No focal deficit present.     Mental Status: She is alert and oriented to person, place, and time. Mental status is at baseline.  Psychiatric:        Mood and Affect: Mood normal.        Behavior: Behavior normal.        Thought Content: Thought content normal.        Judgment: Judgment normal.      No results found for any visits on 12/12/24.    The 10-year ASCVD risk score (Arnett DK, et al., 2019) is: 6.6%    Assessment & Plan:   Problem List Items Addressed This Visit       Other   PTSD (post-traumatic stress disorder)   Relevant Orders   Ambulatory referral to Behavioral Health   Other Visit Diagnoses       Hospital discharge follow-up    -  Primary     Immunization due       Relevant Orders   Flu vaccine trivalent PF, 6mos  and older(Flulaval,Afluria,Fluarix,Fluzone) (Completed)   Varicella-zoster vaccine IM (Completed)     Severe episode of recurrent major depressive disorder, without psychotic features (HCC)       Relevant Orders   Ambulatory referral to Behavioral Health     BMI 45.0-49.9, adult (HCC)       Relevant Medications   Semaglutide , 1 MG/DOSE, (OZEMPIC , 1 MG/DOSE,) 4 MG/3ML SOPN     Prediabetes       Relevant Medications   Semaglutide , 1 MG/DOSE, (OZEMPIC , 1 MG/DOSE,) 4 MG/3ML [facility]      Hospital discharge follow-up  Immunization due -     Flu vaccine trivalent PF, 6mos and older(Flulaval,Afluria,Fluarix,Fluzone) -     Varicella-zoster vaccine IM  PTSD (post-traumatic stress disorder) -     Ambulatory referral to Behavioral Health  Severe episode of recurrent major depressive disorder, without psychotic features (HCC) -     Ambulatory referral to Behavioral Health  BMI 45.0-49.9, adult (HCC) -     Ozempic  (1 MG/DOSE); Inject 1 mg into the skin once a week.  Dispense: 9 mL; Refill: 0  Prediabetes -     Ozempic  (1 MG/DOSE); Inject 1 mg into the skin  once a week.  Dispense: 9 mL; Refill: 0   Pt here for hospital follow up. Refer to [facility] for medication management. Continue Abilify  and Zoloft  for now and counseling as scheduled. Refilled Ozempic  1 mg weekly. Flu and shingrix  #2 today  Return for next scheduled.    Torrence CINDERELLA Barrier, MD  "

## 2024-12-15 ENCOUNTER — Inpatient Hospital Stay: Admitting: Family Medicine

## 2024-12-17 ENCOUNTER — Ambulatory Visit (INDEPENDENT_AMBULATORY_CARE_PROVIDER_SITE_OTHER): Admitting: Registered Nurse

## 2024-12-17 ENCOUNTER — Encounter (HOSPITAL_COMMUNITY): Payer: Self-pay | Admitting: Registered Nurse

## 2024-12-17 DIAGNOSIS — F331 Major depressive disorder, recurrent, moderate: Secondary | ICD-10-CM | POA: Diagnosis not present

## 2024-12-17 DIAGNOSIS — F411 Generalized anxiety disorder: Secondary | ICD-10-CM

## 2024-12-17 DIAGNOSIS — F4310 Post-traumatic stress disorder, unspecified: Secondary | ICD-10-CM | POA: Diagnosis not present

## 2024-12-17 DIAGNOSIS — G479 Sleep disorder, unspecified: Secondary | ICD-10-CM | POA: Diagnosis not present

## 2024-12-17 MED ORDER — PROPRANOLOL HCL 10 MG PO TABS: 10.0000 mg | ORAL_TABLET | Freq: Three times a day (TID) | ORAL | 2 refills | Status: AC | PRN

## 2024-12-17 MED ORDER — ARIPIPRAZOLE 2 MG PO TABS: 2.0000 mg | ORAL_TABLET | Freq: Every day | ORAL | 2 refills | Status: AC

## 2024-12-17 MED ORDER — SERTRALINE HCL 100 MG PO TABS: 100.0000 mg | ORAL_TABLET | Freq: Every day | ORAL | 2 refills | Status: AC

## 2024-12-17 MED ORDER — TRAZODONE HCL 50 MG PO TABS: 50.0000 mg | ORAL_TABLET | Freq: Every evening | ORAL | 2 refills | Status: AC | PRN

## 2024-12-17 NOTE — Patient Instructions (Addendum)
 If no one has contacted, you by the end of business day today please call the appropriate office listed below to schedule your next visit for medication management with Luisa Ruder, NP:    [facility] at [facility] [address], [place], [place] [address]  Phone: [phone] (Call to schedule appointment)  [facility] at [facility] [address], [place], [place] [address] Phone: [phone] (Call to schedule appointment)  Safety Plan Dama Dawes Kelliher will reach out to her mother Thyra), [place], [place], 011, mobile crisis, or present to the nearest emergency room should she experiences any suicidal/homicidal ideation, auditory/visual/hallucinations, or detrimental worsening of her mental health.  She will follow up at [facility] [place] with Luisa Ruder, NP for psychotropic medication management and and continue counseling/therapy services with Nathanel Collet  The suicide prevention education provided includes the following: Suicide risk factors Suicide prevention and interventions National Suicide Hotline telephone number [facility] assessment telephone number [facility] Emergency Assistance 911 [facility] and/or Residential Mobile Crisis Unit telephone number Request made of family/significant other to:  Andreyah and family Remove weapons (e.g., guns, rifles, knives), all items previously/currently identified as safety concern.   Remove drugs/medications (over the counter, prescriptions, illicit drugs), all items previously/currently identified as a safety concern.      BHRT  [place] Police Non-emergency number:  [phone]  Emergency:  911   The Behavioral Health Response Team [facility]) has been specially trained to handle incidents involving persons with mental illness and those in crisis with care and expertise, ensuring such persons  receive appropriate responses based on their needs.  The City's BHRT consists of [employer] and the [employer]  Department's team of licensed clinicians and crisis counselors.   Our clinicians team is tasked with diverting certain people away from the criminal justice system and toward treatment, whenever appropriate and available.   Meet the Team                         Mobile Crisis [employer] Listed by counties in vicinity of [facility] Health providers [facility], [employer]. [phone] [facility] [phone] [facility] [phone] [facility] [phone] [place]                * [facility] [phone]                * [employer] [phone]
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
## 2024-12-24 ENCOUNTER — Ambulatory Visit: Admitting: Professional

## 2024-12-25 ENCOUNTER — Encounter: Payer: Self-pay | Admitting: Professional

## 2024-12-25 ENCOUNTER — Encounter: Admitting: Professional
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
## 2024-12-31 ENCOUNTER — Ambulatory Visit: Admitting: Professional

## 2025-01-07 ENCOUNTER — Ambulatory Visit: Admitting: Professional

## 2025-01-14 ENCOUNTER — Telehealth (HOSPITAL_COMMUNITY): Admitting: Registered Nurse

## 2025-01-14 ENCOUNTER — Ambulatory Visit: Admitting: Professional

## 2025-01-21 ENCOUNTER — Ambulatory Visit: Admitting: Professional

## 2025-01-22 ENCOUNTER — Encounter: Admitting: Family Medicine

## 2025-01-28 ENCOUNTER — Ambulatory Visit: Admitting: Professional

## 2025-02-04 ENCOUNTER — Ambulatory Visit: Admitting: Professional

## 2025-02-11 ENCOUNTER — Ambulatory Visit: Admitting: Professional

## 2025-02-18 ENCOUNTER — Ambulatory Visit: Admitting: Professional

## 2025-02-25 ENCOUNTER — Ambulatory Visit: Admitting: Professional
